# Patient Record
Sex: Male | Born: 1955 | Race: Black or African American | Hispanic: No | Marital: Married | Smoking: Never smoker
Health system: Southern US, Community
[De-identification: ages and names within clinical notes are randomized; demographics above are authoritative.]

## PROBLEM LIST (undated history)

## (undated) DIAGNOSIS — K56609 Unspecified intestinal obstruction, unspecified as to partial versus complete obstruction: Secondary | ICD-10-CM

## (undated) DIAGNOSIS — A4101 Sepsis due to Methicillin susceptible Staphylococcus aureus: Secondary | ICD-10-CM

## (undated) DIAGNOSIS — K812 Acute cholecystitis with chronic cholecystitis: Secondary | ICD-10-CM

## (undated) DIAGNOSIS — C786 Secondary malignant neoplasm of retroperitoneum and peritoneum: Secondary | ICD-10-CM

## (undated) DIAGNOSIS — K632 Fistula of intestine: Secondary | ICD-10-CM

## (undated) DIAGNOSIS — Z515 Encounter for palliative care: Secondary | ICD-10-CM

## (undated) DIAGNOSIS — R7881 Bacteremia: Secondary | ICD-10-CM

## (undated) DIAGNOSIS — C801 Malignant (primary) neoplasm, unspecified: Secondary | ICD-10-CM

## (undated) DIAGNOSIS — C762 Malignant neoplasm of abdomen: Secondary | ICD-10-CM

## (undated) DIAGNOSIS — K219 Gastro-esophageal reflux disease without esophagitis: Secondary | ICD-10-CM

## (undated) DIAGNOSIS — I10 Essential (primary) hypertension: Secondary | ICD-10-CM

## (undated) DIAGNOSIS — D638 Anemia in other chronic diseases classified elsewhere: Secondary | ICD-10-CM

## (undated) DIAGNOSIS — E43 Unspecified severe protein-calorie malnutrition: Secondary | ICD-10-CM

## (undated) DIAGNOSIS — R6521 Severe sepsis with septic shock: Secondary | ICD-10-CM

## (undated) HISTORY — PX: TONSILLECTOMY: SUR1361

---

## 1998-04-12 ENCOUNTER — Emergency Department (HOSPITAL_COMMUNITY): Admission: EM | Admit: 1998-04-12 | Discharge: 1998-04-12 | Payer: Self-pay | Admitting: Emergency Medicine

## 2008-05-27 ENCOUNTER — Encounter: Payer: Self-pay | Admitting: Emergency Medicine

## 2008-05-28 ENCOUNTER — Inpatient Hospital Stay (HOSPITAL_COMMUNITY): Admission: EM | Admit: 2008-05-28 | Discharge: 2008-05-31 | Payer: Self-pay | Admitting: General Surgery

## 2008-06-11 ENCOUNTER — Encounter: Admission: RE | Admit: 2008-06-11 | Discharge: 2008-06-11 | Payer: Self-pay | Admitting: Chiropractic Medicine

## 2008-06-15 ENCOUNTER — Encounter: Admission: RE | Admit: 2008-06-15 | Discharge: 2008-06-22 | Payer: Self-pay | Admitting: General Surgery

## 2008-07-13 ENCOUNTER — Encounter: Admission: RE | Admit: 2008-07-13 | Discharge: 2008-07-13 | Payer: Self-pay | Admitting: Neurology

## 2008-10-30 ENCOUNTER — Encounter: Payer: Self-pay | Admitting: Gastroenterology

## 2008-11-30 ENCOUNTER — Ambulatory Visit: Payer: Self-pay | Admitting: Gastroenterology

## 2008-11-30 DIAGNOSIS — D649 Anemia, unspecified: Secondary | ICD-10-CM

## 2008-11-30 DIAGNOSIS — K921 Melena: Secondary | ICD-10-CM | POA: Insufficient documentation

## 2008-11-30 DIAGNOSIS — K5900 Constipation, unspecified: Secondary | ICD-10-CM | POA: Insufficient documentation

## 2008-12-01 LAB — CONVERTED CEMR LAB
Ferritin: 373.4 ng/mL — ABNORMAL HIGH (ref 22.0–322.0)
Folate: 6.1 ng/mL
Iron: 64 ug/dL (ref 42–165)
Transferrin: 226 mg/dL (ref >=212.0)
Vitamin B-12: 223 pg/mL (ref 211–911)
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
## 2011-09-07 ENCOUNTER — Other Ambulatory Visit (HOSPITAL_COMMUNITY): Payer: Self-pay | Admitting: Oncology

## 2011-09-07 ENCOUNTER — Encounter (HOSPITAL_BASED_OUTPATIENT_CLINIC_OR_DEPARTMENT_OTHER): Payer: 59 | Admitting: Oncology

## 2011-09-07 ENCOUNTER — Encounter: Payer: Self-pay | Admitting: Oncology

## 2011-09-07 LAB — CBC WITH DIFFERENTIAL/PLATELET
BASO%: 0.3 % (ref 0.0–2.0)
EOS%: 2.1 % (ref 0.0–7.0)
MCH: 30 pg (ref 27.2–33.4)
MCV: 88.7 fL (ref 79.3–98.0)
MONO%: 12 % (ref 0.0–14.0)
RBC: 4.29 10*6/uL (ref 4.20–5.82)
RDW: 14.9 % — ABNORMAL HIGH (ref 11.0–14.6)
lymph#: 1.7 10*3/uL (ref 0.9–3.3)

## 2011-09-07 LAB — COMPREHENSIVE METABOLIC PANEL
ALT: 23 U/L (ref 0–53)
AST: 17 U/L (ref 0–37)
Alkaline Phosphatase: 94 U/L (ref 39–117)
Sodium: 141 mEq/L (ref 135–145)
Total Bilirubin: 0.5 mg/dL (ref 0.3–1.2)
Total Protein: 7.7 g/dL (ref 6.0–8.3)

## 2011-09-07 LAB — LACTATE DEHYDROGENASE: LDH: 160 U/L (ref 94–250)

## 2011-09-12 LAB — SEDIMENTATION RATE: Sed Rate: 10 mm/hr (ref 0–16)

## 2011-09-12 LAB — IRON AND TIBC
Iron: 69 ug/dL (ref 42–165)
TIBC: 285 ug/dL (ref 215–435)
UIBC: 216 ug/dL (ref 125–400)

## 2011-09-12 LAB — HEMOCHROMATOSIS DNA-PCR(C282Y,H63D)

## 2011-09-13 LAB — CBC
MCHC: 34.3
MCV: 92.2
Platelets: 168
RBC: 4.35
RDW: 13.1
WBC: 8

## 2011-09-13 LAB — URINE MICROSCOPIC-ADD ON

## 2011-09-13 LAB — DIFFERENTIAL
Basophils Relative: 0
Eosinophils Absolute: 0.2
Monocytes Relative: 10
Neutrophils Relative %: 52

## 2011-09-13 LAB — URINALYSIS, ROUTINE W REFLEX MICROSCOPIC
Bilirubin Urine: NEGATIVE
Specific Gravity, Urine: >1.046 — ABNORMAL HIGH
pH: 5

## 2011-09-13 LAB — POCT I-STAT, CHEM 8
BUN: 27 — ABNORMAL HIGH
Calcium, Ion: 1.19
Chloride: 107
Glucose, Bld: 129 — ABNORMAL HIGH
HCT: 41

## 2011-09-13 LAB — TYPE AND SCREEN: Antibody Screen: NEGATIVE

## 2011-10-02 ENCOUNTER — Other Ambulatory Visit: Payer: Self-pay | Admitting: Gastroenterology

## 2011-10-02 DIAGNOSIS — R109 Unspecified abdominal pain: Secondary | ICD-10-CM

## 2011-10-05 ENCOUNTER — Other Ambulatory Visit: Payer: 59

## 2011-10-09 ENCOUNTER — Ambulatory Visit
Admission: RE | Admit: 2011-10-09 | Discharge: 2011-10-09 | Disposition: A | Discharge status: Home / Self Care | Payer: 59 | Source: Ambulatory Visit | Attending: Gastroenterology | Admitting: Gastroenterology

## 2011-10-09 DIAGNOSIS — R109 Unspecified abdominal pain: Secondary | ICD-10-CM

## 2011-10-24 ENCOUNTER — Ambulatory Visit (HOSPITAL_COMMUNITY)
Admission: RE | Admit: 2011-10-24 | Discharge: 2011-10-24 | Disposition: A | Discharge status: Home / Self Care | Payer: 59 | Source: Ambulatory Visit | Attending: Chiropractic Medicine | Admitting: Chiropractic Medicine

## 2011-10-24 ENCOUNTER — Other Ambulatory Visit (HOSPITAL_COMMUNITY): Payer: Self-pay | Admitting: Chiropractic Medicine

## 2011-10-24 DIAGNOSIS — M545 Low back pain, unspecified: Secondary | ICD-10-CM | POA: Insufficient documentation

## 2011-10-24 DIAGNOSIS — R52 Pain, unspecified: Secondary | ICD-10-CM

## 2011-10-24 DIAGNOSIS — M5137 Other intervertebral disc degeneration, lumbosacral region: Secondary | ICD-10-CM | POA: Insufficient documentation

## 2011-10-24 DIAGNOSIS — M4120 Other idiopathic scoliosis, site unspecified: Secondary | ICD-10-CM | POA: Insufficient documentation

## 2011-10-24 DIAGNOSIS — M51379 Other intervertebral disc degeneration, lumbosacral region without mention of lumbar back pain or lower extremity pain: Secondary | ICD-10-CM | POA: Insufficient documentation

## 2011-10-24 DIAGNOSIS — M542 Cervicalgia: Secondary | ICD-10-CM | POA: Insufficient documentation

## 2011-10-24 DIAGNOSIS — M546 Pain in thoracic spine: Secondary | ICD-10-CM | POA: Insufficient documentation

## 2011-10-30 ENCOUNTER — Telehealth: Payer: Self-pay | Admitting: *Deleted

## 2011-10-30 NOTE — Telephone Encounter (Signed)
GAVE PATIENT APPOINTMENT FOR 11-16-2011.  

## 2011-11-05 ENCOUNTER — Other Ambulatory Visit: Payer: Self-pay | Admitting: Gastroenterology

## 2011-11-07 ENCOUNTER — Other Ambulatory Visit: Payer: 59

## 2011-11-07 ENCOUNTER — Encounter: Payer: Self-pay | Admitting: Emergency Medicine

## 2011-11-07 ENCOUNTER — Inpatient Hospital Stay (HOSPITAL_COMMUNITY)
Admission: EM | Admit: 2011-11-07 | Discharge: 2011-11-09 | DRG: 375 | Disposition: A | Discharge status: Home / Self Care | Payer: 59 | Attending: Internal Medicine | Admitting: Internal Medicine

## 2011-11-07 ENCOUNTER — Ambulatory Visit
Admission: RE | Admit: 2011-11-07 | Discharge: 2011-11-07 | Disposition: A | Discharge status: Home / Self Care | Payer: 59 | Source: Ambulatory Visit | Attending: Gastroenterology | Admitting: Gastroenterology

## 2011-11-07 DIAGNOSIS — K219 Gastro-esophageal reflux disease without esophagitis: Secondary | ICD-10-CM | POA: Diagnosis present

## 2011-11-07 DIAGNOSIS — D500 Iron deficiency anemia secondary to blood loss (chronic): Secondary | ICD-10-CM | POA: Diagnosis present

## 2011-11-07 DIAGNOSIS — K56600 Partial intestinal obstruction, unspecified as to cause: Secondary | ICD-10-CM

## 2011-11-07 DIAGNOSIS — D649 Anemia, unspecified: Secondary | ICD-10-CM | POA: Diagnosis present

## 2011-11-07 DIAGNOSIS — C801 Malignant (primary) neoplasm, unspecified: Secondary | ICD-10-CM | POA: Diagnosis present

## 2011-11-07 DIAGNOSIS — C786 Secondary malignant neoplasm of retroperitoneum and peritoneum: Principal | ICD-10-CM | POA: Diagnosis present

## 2011-11-07 DIAGNOSIS — K56609 Unspecified intestinal obstruction, unspecified as to partial versus complete obstruction: Secondary | ICD-10-CM

## 2011-11-07 DIAGNOSIS — E876 Hypokalemia: Secondary | ICD-10-CM

## 2011-11-07 DIAGNOSIS — K5669 Other intestinal obstruction: Secondary | ICD-10-CM | POA: Diagnosis present

## 2011-11-07 DIAGNOSIS — I10 Essential (primary) hypertension: Secondary | ICD-10-CM | POA: Diagnosis present

## 2011-11-07 DIAGNOSIS — K5900 Constipation, unspecified: Secondary | ICD-10-CM | POA: Diagnosis present

## 2011-11-07 DIAGNOSIS — C762 Malignant neoplasm of abdomen: Secondary | ICD-10-CM | POA: Diagnosis present

## 2011-11-07 DIAGNOSIS — D72829 Elevated white blood cell count, unspecified: Secondary | ICD-10-CM | POA: Diagnosis present

## 2011-11-07 HISTORY — DX: Unspecified intestinal obstruction, unspecified as to partial versus complete obstruction: K56.609

## 2011-11-07 HISTORY — DX: Gastro-esophageal reflux disease without esophagitis: K21.9

## 2011-11-07 HISTORY — DX: Essential (primary) hypertension: I10

## 2011-11-07 LAB — URINALYSIS, ROUTINE W REFLEX MICROSCOPIC
Leukocytes, UA: NEGATIVE
Nitrite: POSITIVE — AB
Specific Gravity, Urine: 1.015 (ref 1.005–1.030)
pH: 5.5 (ref 5.0–8.0)

## 2011-11-07 LAB — CBC
HCT: 38.3 % — ABNORMAL LOW (ref 39.0–52.0)
MCHC: 33.7 g/dL (ref 30.0–36.0)
Platelets: 307 10*3/uL (ref 150–400)
RDW: 14.6 % (ref 11.5–15.5)
WBC: 10.6 10*3/uL — ABNORMAL HIGH (ref 4.0–10.5)

## 2011-11-07 LAB — COMPREHENSIVE METABOLIC PANEL
ALT: 32 U/L (ref 0–53)
AST: 26 U/L (ref 0–37)
Albumin: 3.7 g/dL (ref 3.5–5.2)
Chloride: 102 mEq/L (ref 96–112)
Creatinine, Ser: 0.94 mg/dL (ref 0.50–1.35)
Sodium: 138 mEq/L (ref 135–145)
Total Bilirubin: 0.6 mg/dL (ref 0.3–1.2)

## 2011-11-07 LAB — DIFFERENTIAL
Basophils Absolute: 0 10*3/uL (ref 0.0–0.1)
Basophils Relative: 0 % (ref 0–1)
Monocytes Absolute: 1.1 10*3/uL — ABNORMAL HIGH (ref 0.1–1.0)
Neutro Abs: 7.8 10*3/uL — ABNORMAL HIGH (ref 1.7–7.7)
Neutrophils Relative %: 74 % (ref 43–77)

## 2011-11-07 LAB — URINE MICROSCOPIC-ADD ON

## 2011-11-07 LAB — LACTIC ACID, PLASMA: Lactic Acid, Venous: 0.8 mmol/L (ref 0.5–2.2)

## 2011-11-07 MED ORDER — SODIUM CHLORIDE 0.9 % IV BOLUS (SEPSIS)
1000.0000 mL | Freq: Once | INTRAVENOUS | Status: AC
  Administered 2011-11-07: 1000 mL via INTRAVENOUS

## 2011-11-07 MED ORDER — DEXTROSE-NACL 5-0.9 % IV SOLN
INTRAVENOUS | Status: DC
  Administered 2011-11-08 – 2011-11-09 (×3): via INTRAVENOUS

## 2011-11-07 MED ORDER — HYDROMORPHONE HCL PF 1 MG/ML IJ SOLN
1.0000 mg | INTRAMUSCULAR | Status: DC | PRN
  Administered 2011-11-09 (×2): 1 mg via INTRAVENOUS
  Filled 2011-11-07 (×2): qty 1

## 2011-11-07 MED ORDER — IOHEXOL 300 MG/ML  SOLN
125.0000 mL | Freq: Once | INTRAMUSCULAR | Status: AC | PRN
  Administered 2011-11-07: 125 mL via INTRAVENOUS

## 2011-11-07 MED ORDER — PANTOPRAZOLE SODIUM 40 MG IV SOLR
40.0000 mg | INTRAVENOUS | Status: DC
  Administered 2011-11-08 (×3): 40 mg via INTRAVENOUS
  Filled 2011-11-07 (×3): qty 40

## 2011-11-07 MED ORDER — ONDANSETRON HCL 4 MG PO TABS: 4.0000 mg | ORAL_TABLET | Freq: Four times a day (QID) | ORAL | Status: DC | PRN

## 2011-11-07 MED ORDER — MORPHINE SULFATE 4 MG/ML IJ SOLN
4.0000 mg | Freq: Once | INTRAMUSCULAR | Status: AC
  Administered 2011-11-07: 4 mg via INTRAVENOUS
  Filled 2011-11-07: qty 1

## 2011-11-07 MED ORDER — ONDANSETRON HCL 4 MG/2ML IJ SOLN: 4.0000 mg | Freq: Four times a day (QID) | INTRAMUSCULAR | Status: DC | PRN

## 2011-11-07 NOTE — ED Notes (Signed)
Pt c/o LUQ pain since Aug.  St's had CT this am and was called and told to come to ED ref. Some type of blockage.  Pt does not appear in acute distress at this time

## 2011-11-07 NOTE — ED Notes (Signed)
Patient states abdominal discomfort for few months haven been by Primary Doctor and had a CT of the abdomen today and patient stated told to go to the ED for SBO.  Currently pain is 7/10 achy abdomen is distended last BM 2 days ago. Airway intact bilateral equal chest rise and fall.

## 2011-11-07 NOTE — H&P (Signed)
[name] is an 55 y.o. male.   Chief Complaint: Abdominal Pain HPI: A 55 YO man who has been having abdominal pain and constipation for a while. He was seen as OP by [facility]. 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 He is being admitted for further work up. Has Abdominal pain, nausea and an episode of Vomiting in the ED.  No active bleed.   Past Medical History  Diagnosis Date  . Hypertension   . GERD (gastroesophageal reflux disease)   . Anemia     Past Surgical History  Procedure Date  . Tonsillectomy     History reviewed. No pertinent family history. Social History:  reports that he has never smoked. He does not have any smokeless tobacco history on file. He reports that he does not drink alcohol or use illicit drugs.  Allergies: No Known Allergies  Medications Prior to Admission  Medication Dose Route Frequency Provider Last Rate Last Dose  . iohexol (OMNIPAQUE) 300 MG/ML injection 125 mL  125 mL Intravenous Once PRN Medication Radiologist   125 mL at 11/07/11 1022  . morphine 4 MG/ML injection 4 mg  4 mg Intravenous Once Cyndra Numbers, MD   4 mg at 11/07/11 1806  . sodium chloride 0.9 % bolus 1,000 mL  1,000 mL Intravenous Once Cyndra Numbers, MD   1,000 mL at 11/07/11 1805  . sodium chloride 0.9 % bolus 1,000 mL  1,000 mL Intravenous Once Cyndra Numbers, MD   1,000 mL at 11/07/11 1931   No current outpatient prescriptions on file as of 11/07/2011.    Results for orders placed during the hospital encounter of 11/07/11 (from the past 48 hour(s))  CBC     Status: Abnormal   Collection Time   11/07/11  5:34 PM      Component Value Range Comment   WBC 10.6 (*) 4.0 - 10.5 (K/uL)    RBC 4.41  4.22 - 5.81 (MIL/uL)    Hemoglobin 12.9 (*) 13.0 - 17.0 (g/dL)    HCT 40.9 (*) 81.1 - 52.0 (%)    MCV 86.8  78.0 - 100.0 (fL)    MCH 29.3  26.0 - 34.0 (pg)    MCHC 33.7   30.0 - 36.0 (g/dL)    RDW 91.4  78.2 - 95.6 (%)    Platelets 307  150 - 400 (K/uL)   DIFFERENTIAL     Status: Abnormal   Collection Time   11/07/11  5:34 PM      Component Value Range Comment   Neutrophils Relative 74  43 - 77 (%)    Neutro Abs 7.8 (*) 1.7 - 7.7 (K/uL)    Lymphocytes Relative 14  12 - 46 (%)    Lymphs Abs 1.5  0.7 - 4.0 (K/uL)    Monocytes Relative 10  3 - 12 (%)    Monocytes Absolute 1.1 (*) 0.1 - 1.0 (K/uL)    Eosinophils Relative 1  0 - 5 (%)    Eosinophils Absolute 0.1  0.0 - 0.7 (K/uL)    Basophils Relative 0  0 - 1 (%)    Basophils Absolute 0.0  0.0 - 0.1 (K/uL)   COMPREHENSIVE METABOLIC PANEL     Status: Abnormal   Collection Time   11/07/11  5:34 PM      Component Value Range Comment   Sodium 138  135 - 145 (  mEq/L)    Potassium 3.8  3.5 - 5.1 (mEq/L)    Chloride 102  96 - 112 (mEq/L)    CO2 25  19 - 32 (mEq/L)    Glucose, Bld 109 (*) 70 - 99 (mg/dL)    BUN 10  6 - 23 (mg/dL)    Creatinine, Ser 2.13  0.50 - 1.35 (mg/dL)    Calcium 9.7  8.4 - 10.5 (mg/dL)    Total Protein 7.8  6.0 - 8.3 (g/dL)    Albumin 3.7  3.5 - 5.2 (g/dL)    AST 26  0 - 37 (U/L)    ALT 32  0 - 53 (U/L)    Alkaline Phosphatase 118 (*) 39 - 117 (U/L)    Total Bilirubin 0.6  0.3 - 1.2 (mg/dL)    GFR calc non Af Amer >90  >90 (mL/min)    GFR calc Af Amer >90  >90 (mL/min)   LIPASE, BLOOD     Status: Normal   Collection Time   11/07/11  5:34 PM      Component Value Range Comment   Lipase 35  11 - 59 (U/L)   URINALYSIS, ROUTINE W REFLEX MICROSCOPIC     Status: Abnormal   Collection Time   11/07/11  5:50 PM      Component Value Range Comment   Color, Urine YELLOW  YELLOW     Appearance CLEAR  CLEAR     Specific Gravity, Urine 1.015  1.005 - 1.030     pH 5.5  5.0 - 8.0     Glucose, UA NEGATIVE  NEGATIVE (mg/dL)    Hgb urine dipstick NEGATIVE  NEGATIVE     Bilirubin Urine SMALL (*) NEGATIVE     Ketones, ur 15 (*) NEGATIVE (mg/dL)    Protein, ur 30 (*) NEGATIVE (mg/dL)     Urobilinogen, UA 1.0  0.0 - 1.0 (mg/dL)    Nitrite POSITIVE (*) NEGATIVE     Leukocytes, UA NEGATIVE  NEGATIVE    URINE MICROSCOPIC-ADD ON     Status: Abnormal   Collection Time   11/07/11  5:50 PM      Component Value Range Comment   Squamous Epithelial / LPF RARE  RARE     WBC, UA 3-6  <3 (WBC/hpf)    RBC / HPF 0-2  <3 (RBC/hpf)    Bacteria, UA MANY (*) RARE    LACTIC ACID, PLASMA     Status: Normal   Collection Time   11/07/11  6:18 PM      Component Value Range Comment   Lactic Acid, Venous 0.8  0.5 - 2.2 (mmol/L)    Ct Abdomen Pelvis W Contrast  11/07/2011  *RADIOLOGY REPORT*  Clinical Data: Mid abdominal pain with alternating diarrhea and constipation for 4 months.  No acute injury or history of malignancy.  CT ABDOMEN AND PELVIS WITH CONTRAST  Technique:  Multidetector CT imaging of the abdomen and pelvis was performed following the standard protocol during bolus administration of intravenous contrast.  Contrast: OMNIPAQUE IOHEXOL 300 MG/ML IV SOLN  Comparison: Upper GI series 10/09/2011.  Abdominal pelvic CT 05/27/2008.  Findings: The lung bases are clear and there is no pleural effusion.  The patient has developed a small amount of ascites throughout the peritoneal cavity.  There is new omental nodularity asymmetric to the left concerning for possible peritoneal carcinomatosis.  No dominant peritoneal mass is demonstrated. Scout image demonstrates moderate proximal to mid small bowel distension.  The distal small bowel  is decompressed.  There are several areas of irregular small bowel wall thickening and luminal narrowing in the right mid abdomen.  A small amount of fluid is present within the colonic lumen.  The colon is relatively decompressed.  No focal colon abnormality is identified.  The liver, spleen, pancreas, gallbladder and biliary system appear unremarkable.  There is no adrenal mass.  A 2.2 cm low density lesion centrally in the right kidney was not demonstrated  previously but is most consistent with a cyst.  The left kidney appears normal.  There is no hydronephrosis.  No enlarged lymph nodes are identified.  The prostate gland is moderately enlarged but appears stable.  There is mild bladder wall thickening.  No suspicious osseous lesions are present.  IMPRESSION:  1.  Partial distal small bowel obstruction.  Ancillary findings of ascites and peritoneal nodularity suspicious for a malignant etiology (peritoneal carcinomatosis or primary small bowel neoplasm). 2.  No evidence of hepatic metastatic disease. 3.  Simple right renal cyst. 4.  Stable enlargement of the prostate gland.  These results were called by telephone on 11/07/2011  at  1045 hours to  [facility], assistant of Dr. Evette Cristal, who verbally acknowledged these results.  Original Report Authenticated By: Gerrianne Scale, M.D.    Review of Systems  Constitutional: Positive for weight loss and malaise/fatigue.  Eyes: Negative.   Respiratory: Negative.   Cardiovascular: Negative.   Gastrointestinal: Positive for nausea, vomiting, abdominal pain, constipation and blood in stool.  Genitourinary: Negative.   Musculoskeletal: Negative.   Skin: Negative.   Neurological: Positive for weakness.  Endo/Heme/Allergies: Negative.   Psychiatric/Behavioral: Negative.     Blood pressure 159/95, pulse 82, temperature 97.5 F (36.4 C), temperature source Oral, resp. rate 20, SpO2 97.00%. Physical Exam  Constitutional: He appears well-developed and well-nourished.  HENT:  Head: Normocephalic and atraumatic.  Eyes: Conjunctivae and EOM are normal. Pupils are equal, round, and reactive to light.  Neck: Normal range of motion. Neck supple.  Cardiovascular: Normal rate, regular rhythm and normal heart sounds.   Respiratory: Effort normal and breath sounds normal.  GI: He exhibits shifting dullness, distension, fluid wave and ascites. There is generalized tenderness. There is no rebound and no guarding.    Psychiatric: His speech is normal and behavior is normal. Judgment and thought content normal. Cognition and memory are normal. He exhibits a depressed mood.     Assessment/Plan 1. PSBO: No active vomiting or distension. Keep NPO, hydrate, consider NGT only if vomiting or worsening distension 2. 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 Will get Paracentesis and send fluid for cytology 3. Anemia: due to GIB and tumor. H/H seems stable. 4. Leucocytosis: Possible UTI. Will treat if confirmed  [name],LAWAL 11/07/2011, 8:38 PM

## 2011-11-07 NOTE — ED Provider Notes (Signed)
History     CSN: [id number] Arrival date & time: 11/07/2011  3:27 PM   First MD Initiated Contact with Patient 11/07/11 1722      No chief complaint on file.   (Consider location/radiation/quality/duration/timing/severity/associated sxs/prior treatment) HPI Patient is a 55 year old male who presents today after being referred by his gastroenterologist, Dr. Evette Cristal.  Patient has been having abdominal pain since August. He has had a colonoscopy as well as upper endoscopy they were negative. Today patient had a CAT scan of his abdomen and pelvis as an outpatient. His doctor told him that he should come here as there were concerns for obstruction on the film. The patient reports his last bowel movement was 2 days ago. He's never had surgery on his belly. The patient did vomit when he had contrast this morning. He denies any fevers or blood in his stools. He denies any urinary symptoms including increased urinary frequency or dysuria. There are no other associated or modifying factors. Patient reports that the pain in his upper abdomen is intermittent and also moderate to severe. It has an aching quality. There are no other associated or modifying factors. Past Medical History  Diagnosis Date  . Hypertension   . GERD (gastroesophageal reflux disease)   . Anemia     Past Surgical History  Procedure Date  . Tonsillectomy     History reviewed. No pertinent family history.  History  Substance Use Topics  . Smoking status: Never Smoker   . Smokeless tobacco: Not on file  . Alcohol Use: No     Stopped in July      Review of Systems  Constitutional: Positive for appetite change.  HENT: Negative.   Eyes: Negative.   Respiratory: Negative.   Cardiovascular: Negative.   Gastrointestinal: Positive for nausea, vomiting, abdominal pain and constipation.  Genitourinary: Negative.   Musculoskeletal: Negative.   Skin: Negative.   Neurological: Negative.   Hematological: Negative.     Psychiatric/Behavioral: Negative.   All other systems reviewed and are negative.    Allergies  Review of patient's allergies indicates no known allergies.  Home Medications   Current Outpatient Rx  Name Route Sig Dispense Refill  . ESOMEPRAZOLE MAGNESIUM 40 MG PO CPDR Oral Take 40 mg by mouth daily before breakfast.      . LISINOPRIL 10 MG PO TABS Oral Take 10 mg by mouth daily.      Marland Kitchen PROBIOTIC PEARLS PO Oral Take 1 tablet by mouth daily.        BP 158/93  Pulse 67  Temp(Src) 97.4 F (36.3 C) (Oral)  Resp 20  SpO2 97%  Physical Exam  Nursing note and vitals reviewed. Constitutional: He is oriented to person, place, and time. He appears well-developed and well-nourished. No distress.  HENT:  Head: Normocephalic and atraumatic.  Eyes: Conjunctivae and EOM are normal. Pupils are equal, round, and reactive to light.  Neck: Normal range of motion.  Cardiovascular: Normal rate, regular rhythm, normal heart sounds and intact distal pulses.  Exam reveals no gallop and no friction rub.   No murmur heard. Pulmonary/Chest: Effort normal and breath sounds normal. No respiratory distress. He has no wheezes. He has no rales.  Abdominal: Soft. Bowel sounds are normal. He exhibits no distension. There is generalized tenderness. There is no rigidity, no rebound and no guarding.  Musculoskeletal: Normal range of motion. He exhibits no edema and no tenderness.  Neurological: He is alert and oriented to person, place, and time. No cranial nerve deficit.  He exhibits normal muscle tone. Coordination normal.  Skin: Skin is warm and dry. No rash noted.  Psychiatric: He has a normal mood and affect.    ED Course  Procedures (including critical care time)   CRITICAL CARE Performed by: Cyndra Numbers   Total critical care time: 60 minutes  Critical care time was exclusive of separately billable procedures and treating other patients.  Critical care was necessary to treat or prevent  imminent or life-threatening deterioration.  Critical care was time spent personally by me on the following activities: development of treatment plan with patient and/or surrogate as well as nursing, discussions with consultants, evaluation of patient's response to treatment, examination of patient, obtaining history from patient or surrogate, ordering and performing treatments and interventions, ordering and review of laboratory studies, ordering and review of radiographic studies, pulse oximetry and re-evaluation of patient's condition. Labs Reviewed  CBC - Abnormal; Notable for the following:    WBC 10.6 (*)    Hemoglobin 12.9 (*)    HCT 38.3 (*)    All other components within normal limits  DIFFERENTIAL - Abnormal; Notable for the following:    Neutro Abs 7.8 (*)    Monocytes Absolute 1.1 (*)    All other components within normal limits  COMPREHENSIVE METABOLIC PANEL - Abnormal; Notable for the following:    Glucose, Bld 109 (*)    Alkaline Phosphatase 118 (*)    All other components within normal limits  URINALYSIS, ROUTINE W REFLEX MICROSCOPIC - Abnormal; Notable for the following:    Bilirubin Urine SMALL (*)    Ketones, ur 15 (*)    Protein, ur 30 (*)    Nitrite POSITIVE (*)    All other components within normal limits  URINE MICROSCOPIC-ADD ON - Abnormal; Notable for the following:    Bacteria, UA MANY (*)    All other components within normal limits  LIPASE, BLOOD  LACTIC ACID, PLASMA   Ct Abdomen Pelvis W Contrast  11/07/2011  *RADIOLOGY REPORT*  Clinical Data: Mid abdominal pain with alternating diarrhea and constipation for 4 months.  No acute injury or history of malignancy.  CT ABDOMEN AND PELVIS WITH CONTRAST  Technique:  Multidetector CT imaging of the abdomen and pelvis was performed following the standard protocol during bolus administration of intravenous contrast.  Contrast: OMNIPAQUE IOHEXOL 300 MG/ML IV SOLN  Comparison: Upper GI series 10/09/2011.   Abdominal pelvic CT 05/27/2008.  Findings: The lung bases are clear and there is no pleural effusion.  The patient has developed a small amount of ascites throughout the peritoneal cavity.  There is new omental nodularity asymmetric to the left concerning for possible peritoneal carcinomatosis.  No dominant peritoneal mass is demonstrated. Scout image demonstrates moderate proximal to mid small bowel distension.  The distal small bowel is decompressed.  There are several areas of irregular small bowel wall thickening and luminal narrowing in the right mid abdomen.  A small amount of fluid is present within the colonic lumen.  The colon is relatively decompressed.  No focal colon abnormality is identified.  The liver, spleen, pancreas, gallbladder and biliary system appear unremarkable.  There is no adrenal mass.  A 2.2 cm low density lesion centrally in the right kidney was not demonstrated previously but is most consistent with a cyst.  The left kidney appears normal.  There is no hydronephrosis.  No enlarged lymph nodes are identified.  The prostate gland is moderately enlarged but appears stable.  There is mild bladder wall  thickening.  No suspicious osseous lesions are present.  IMPRESSION:  1.  Partial distal small bowel obstruction.  Ancillary findings of ascites and peritoneal nodularity suspicious for a malignant etiology (peritoneal carcinomatosis or primary small bowel neoplasm). 2.  No evidence of hepatic metastatic disease. 3.  Simple right renal cyst. 4.  Stable enlargement of the prostate gland.  These results were called by telephone on 11/07/2011  at  1045 hours to  [facility], assistant of Dr. Evette Cristal, who verbally acknowledged these results.  Original Report Authenticated By: Gerrianne Scale, M.D.     1. Partial small bowel obstruction       MDM  The patient was evaluated by myself. Patient had a CT today there was performed as an outpatient. His gastroenterologist told him only that he might  have a small bowel distraction. On my review of the result the patient actually had concerning findings for malignancy. He has no known malignancy at this time. Patient has lesions in both the small intestine as well as the peritoneal wall. I contacted the gastroenterologist.  I was able to reach Dr. Ewing Schlein who was on call for the primary gastroenterologist. He had not been told that the patient being sent to the ED. He was kind enough to speak with the patient's primary gastroenterologist regarding the findings. After speaking with him he did agree that the patient's prognosis was very grave. I then spoke with Dr. Daphine Deutscher who is on for general. surgery at [facility] but was kind enough to discuss the case.  Given the patient's current exam there was no need for immediate surgical involvement.  Dr. Daphine Deutscher did agree that the patient would likely need paracentesis with cytology studies as well as further inpatient evaluation. I spoke at length with the patient and his family regarding his CAT scan results. Based on our conversation they were not aware of the possibility of malignancy. I spent 20-30 minutes in conversation with the family attempting to answer their questions. The patient was accepted for admission to the hospitalist service. Gastroenterology was consult it once again to discuss the patient's care with the admitting team. The patient will be admitted for further management.  Cyndra Numbers, MD 11/07/11 2228

## 2011-11-08 ENCOUNTER — Encounter (HOSPITAL_COMMUNITY): Payer: Self-pay | Admitting: General Practice

## 2011-11-08 ENCOUNTER — Inpatient Hospital Stay (HOSPITAL_COMMUNITY): Payer: 59

## 2011-11-08 DIAGNOSIS — E876 Hypokalemia: Secondary | ICD-10-CM

## 2011-11-08 LAB — COMPREHENSIVE METABOLIC PANEL
Albumin: 2.9 g/dL — ABNORMAL LOW (ref 3.5–5.2)
BUN: 8 mg/dL (ref 6–23)
Creatinine, Ser: 0.86 mg/dL (ref 0.50–1.35)
Total Protein: 5.8 g/dL — ABNORMAL LOW (ref 6.0–8.3)

## 2011-11-08 LAB — MAGNESIUM: Magnesium: 1.5 mg/dL (ref 1.5–2.5)

## 2011-11-08 LAB — CBC
HCT: 30.4 % — ABNORMAL LOW (ref 39.0–52.0)
MCV: 87.6 fL (ref 78.0–100.0)
Platelets: 219 10*3/uL (ref 150–400)
RBC: 3.47 MIL/uL — ABNORMAL LOW (ref 4.22–5.81)
WBC: 4.5 10*3/uL (ref 4.0–10.5)

## 2011-11-08 LAB — PSA: PSA: 7.13 ng/mL — ABNORMAL HIGH (ref <=4.00)

## 2011-11-08 MED ORDER — POTASSIUM CHLORIDE CRYS ER 20 MEQ PO TBCR
40.0000 meq | EXTENDED_RELEASE_TABLET | Freq: Once | ORAL | Status: AC
  Administered 2011-11-08: 40 meq via ORAL

## 2011-11-08 NOTE — Procedures (Signed)
RLQ paracentesis yields 200 cc clear yellow fluid.  No comp.

## 2011-11-08 NOTE — Progress Notes (Signed)
Subjective: Feels 100% better. Is ready to eat.  Objective: Vital signs in last 24 hours: Temp:  [97.3 F (36.3 C)-98.3 F (36.8 C)] 98.3 F (36.8 C) (11/22 0615) Pulse Rate:  [62-82] 62  (11/22 0615) Resp:  [20] 20  (11/22 0615) BP: (145-167)/(87-96) 167/94 mmHg (11/22 0900) SpO2:  [97 %-100 %] 99 % (11/22 0615) Weight:  [117.2 kg (258 lb 6.1 oz)] 258 lb 6.1 oz (117.2 kg) (11/21 2200) Weight change:  Last BM Date: 11/06/11  Intake/Output from previous day: 11/21 0701 - 11/22 0700 In: 800 [IV Piggyback:800] Out: 850 [Urine:850] Total I/O In: -  Out: 225 [Urine:225]   Physical Exam: General: Alert, awake, oriented x3, in no acute distress. HEENT: No bruits, no goiter. Heart: Regular rate and rhythm, without murmurs, rubs, gallops. Lungs: Clear to auscultation bilaterally. Abdomen: Soft, nontender, hypoactive bowel sounds Extremities: No clubbing cyanosis or edema with positive pedal pulses. Neuro: Grossly intact, nonfocal.    Lab Results: Basic Metabolic Panel:  Basename 11/08/11 0720 11/07/11 1734  NA 141 138  K 3.3* 3.8  CL 108 102  CO2 25 25  GLUCOSE 124* 109*  BUN 8 10  CREATININE 0.86 0.94  CALCIUM 8.4 9.7  MG -- --  PHOS -- --   Liver Function Tests:  [facility] 11/08/11 0720 11/07/11 1734  AST 19 26  ALT 25 32  ALKPHOS 88 118*  BILITOT 0.6 0.6  PROT 5.8* 7.8  ALBUMIN 2.9* 3.7    Basename 11/07/11 1734  LIPASE 35  AMYLASE --   CBC:  Basename 11/08/11 0720 11/07/11 1734  WBC 4.5 10.6*  NEUTROABS -- 7.8*  HGB 10.2* 12.9*  HCT 30.4* 38.3*  MCV 87.6 86.8  PLT 219 307    Studies/Results: Abd 1 View (kub)  11/08/2011  *RADIOLOGY REPORT*  Clinical Data: partial small bowel obstruction  ABDOMEN - 1 VIEW  Comparison: Yesterday  Findings: Distended small bowel loops persist.  No obvious free intraperitoneal gas.  Colonic gas is present.  IMPRESSION: Persistent partial small bowel obstruction pattern.  Original Report Authenticated By: Donavan Burnet, M.D.   Ct Abdomen Pelvis W Contrast  11/07/2011  *RADIOLOGY REPORT*  Clinical Data: Mid abdominal pain with alternating diarrhea and constipation for 4 months.  No acute injury or history of malignancy.  CT ABDOMEN AND PELVIS WITH CONTRAST  Technique:  Multidetector CT imaging of the abdomen and pelvis was performed following the standard protocol during bolus administration of intravenous contrast.  Contrast: OMNIPAQUE IOHEXOL 300 MG/ML IV SOLN  Comparison: Upper GI series 10/09/2011.  Abdominal pelvic CT 05/27/2008.  Findings: The lung bases are clear and there is no pleural effusion.  The patient has developed a small amount of ascites throughout the peritoneal cavity.  There is new omental nodularity asymmetric to the left concerning for possible peritoneal carcinomatosis.  No dominant peritoneal mass is demonstrated. Scout image demonstrates moderate proximal to mid small bowel distension.  The distal small bowel is decompressed.  There are several areas of irregular small bowel wall thickening and luminal narrowing in the right mid abdomen.  A small amount of fluid is present within the colonic lumen.  The colon is relatively decompressed.  No focal colon abnormality is identified.  The liver, spleen, pancreas, gallbladder and biliary system appear unremarkable.  There is no adrenal mass.  A 2.2 cm low density lesion centrally in the right kidney was not demonstrated previously but is most consistent with a cyst.  The left kidney appears normal.  There is no hydronephrosis.  No enlarged lymph nodes are identified.  The prostate gland is moderately enlarged but appears stable.  There is mild bladder wall thickening.  No suspicious osseous lesions are present.  IMPRESSION:  1.  Partial distal small bowel obstruction.  Ancillary findings of ascites and peritoneal nodularity suspicious for a malignant etiology (peritoneal carcinomatosis or primary small bowel neoplasm). 2.  No evidence of  hepatic metastatic disease. 3.  Simple right renal cyst. 4.  Stable enlargement of the prostate gland.  These results were called by telephone on 11/07/2011  at  1045 hours to  [facility], assistant of Dr. Evette Cristal, who verbally acknowledged these results.  Original Report Authenticated By: Gerrianne Scale, M.D.   US Paracentesis  11/08/2011  *RADIOLOGY REPORT*  Clinical Data: Ascites  ULTRASOUND GUIDED PARACENTESIS  Comparison:  None  An ultrasound guided paracentesis was thoroughly discussed with the patient and questions answered.  The benefits, risks, alternatives and complications were also discussed.  The patient understands and wishes to proceed with the procedure.  Written consent was obtained.  Ultrasound was performed to localize and mark an adequate pocket of fluid in the right lower quadrant quadrant of the abdomen.  The area was then prepped and draped in the normal sterile fashion.  1% Lidocaine was used for local anesthesia.  Under ultrasound guidance a 19 gauge Yueh catheter was introduced.  Paracentesis was performed.  The catheter was removed and a dressing applied.  Complications:  None  Findings:  A total of approximately 200 ml of clear yellow fluid was removed.  A fluid sample was sent for laboratory analysis.  IMPRESSION: Successful ultrasound guided paracentesis yielding 200 ml of ascites.  Original Report Authenticated By: Donavan Burnet, M.D.    Medications: Scheduled Meds:   .  morphine injection  4 mg Intravenous Once  . pantoprazole (PROTONIX) IV  40 mg Intravenous Q24H  . sodium chloride  1,000 mL Intravenous Once  . sodium chloride  1,000 mL Intravenous Once   Continuous Infusions:   . dextrose 5 % and 0.9% NaCl 125 mL/hr at 11/08/11 0158   PRN Meds:.HYDROmorphone, ondansetron (ZOFRAN) IV, ondansetron  Assessment/Plan:  Principal Problem:  *SBO (small bowel obstruction) Active Problems:  ANEMIA  Abdominal malignant neoplasm  Leukocytosis  #1 partial small bowel  obstruction: Clinically improved. Did not require NG suction. Feels like he is ready to eat. I will start him on clear liquids and advance diet as tolerated.  #2 peritoneal carcinomatosis: This is probably what caused his small bowel obstruction. Upon questioning patient states he has been having abdominal pain for about 3 months. He has seen Dr. Evette Cristal with GI who has done an EGD and colonoscopy that per patient report are negative. I have ordered a PSA, CEA, CA 19 9, he has also had a paracentesis and fluid has been sent for cytology. I will call GI to see him in the morning.  #3 hypokalemia: Replete orally and check a magnesium level.   LOS: 1 day   [name],[name] 11/08/2011, 11:04 AM

## 2011-11-09 ENCOUNTER — Inpatient Hospital Stay (HOSPITAL_COMMUNITY): Payer: 59

## 2011-11-09 LAB — CBC
Hemoglobin: 10.5 g/dL — ABNORMAL LOW (ref 13.0–17.0)
MCH: 29 pg (ref 26.0–34.0)
MCHC: 33 g/dL (ref 30.0–36.0)
RDW: 14.5 % (ref 11.5–15.5)

## 2011-11-09 LAB — COMPREHENSIVE METABOLIC PANEL
ALT: 23 U/L (ref 0–53)
AST: 17 U/L (ref 0–37)
Alkaline Phosphatase: 89 U/L (ref 39–117)
CO2: 27 mEq/L (ref 19–32)
Calcium: 8.6 mg/dL (ref 8.4–10.5)
Chloride: 109 mEq/L (ref 96–112)
GFR calc Af Amer: >90 mL/min (ref >90)
GFR calc non Af Amer: >90 mL/min (ref >90)
Glucose, Bld: 127 mg/dL — ABNORMAL HIGH (ref 70–99)
Potassium: 3.7 mEq/L (ref 3.5–5.1)
Sodium: 142 mEq/L (ref 135–145)
Total Bilirubin: 0.4 mg/dL (ref 0.3–1.2)

## 2011-11-09 MED ORDER — HYDROCODONE-ACETAMINOPHEN 5-500 MG PO TABS: 1.0000 | ORAL_TABLET | Freq: Four times a day (QID) | ORAL | Status: AC | PRN

## 2011-11-09 MED ORDER — IOHEXOL 300 MG/ML  SOLN
80.0000 mL | Freq: Once | INTRAMUSCULAR | Status: AC | PRN
  Administered 2011-11-09: 80 mL via INTRAVENOUS

## 2011-11-09 NOTE — Discharge Summary (Signed)
Physician Discharge Summary  Patient ID: [name] MRN: [id number] DOB/AGE: 01-02-1956 55 y.o.  Admit date: 11/07/2011 Discharge date: 11/09/2011  Primary Care Physician:  No primary provider on file.   Discharge Diagnoses:    Principal Problem:  *SBO (small bowel obstruction) Active Problems:  ANEMIA  ??Peritoneal Carcinomatosis  Leukocytosis  Hypokalemia    Current Discharge Medication List    START taking these medications   Details  HYDROcodone-acetaminophen (VICODIN) 5-500 MG per tablet Take 1 tablet by mouth every 6 (six) hours as needed for pain. Qty: 30 tablet, Refills: 0      CONTINUE these medications which have NOT CHANGED   Details  esomeprazole (NEXIUM) 40 MG capsule Take 40 mg by mouth daily before breakfast.      lisinopril (PRINIVIL,ZESTRIL) 10 MG tablet Take 10 mg by mouth daily.        STOP taking these medications     Probiotic Product (PROBIOTIC PEARLS PO)          Disposition and Follow-up:  Patient will be discharged home today in stable and improved condition. Has an outpatient appointment for a PET scan on December 6, he has also been given the number for health connect to appoint himself a new primary care physician, has also been asked to schedule followup with his gastroenterologist Dr. Evette Cristal in one week.  Consults:  none    Significant Diagnostic Studies:  Abd 1 View (kub)  11/08/2011  *RADIOLOGY REPORT*  Clinical Data: partial small bowel obstruction  ABDOMEN - 1 VIEW  Comparison: Yesterday  Findings: Distended small bowel loops persist.  No obvious free intraperitoneal gas.  Colonic gas is present.  IMPRESSION: Persistent partial small bowel obstruction pattern.  Original Report Authenticated By: Donavan Burnet, M.D.   US Paracentesis  11/08/2011  *RADIOLOGY REPORT*  Clinical Data: Ascites  ULTRASOUND GUIDED PARACENTESIS  Comparison:  None  An ultrasound guided paracentesis was thoroughly discussed with the patient and  questions answered.  The benefits, risks, alternatives and complications were also discussed.  The patient understands and wishes to proceed with the procedure.  Written consent was obtained.  Ultrasound was performed to localize and mark an adequate pocket of fluid in the right lower quadrant quadrant of the abdomen.  The area was then prepped and draped in the normal sterile fashion.  1% Lidocaine was used for local anesthesia.  Under ultrasound guidance a 19 gauge Yueh catheter was introduced.  Paracentesis was performed.  The catheter was removed and a dressing applied.  Complications:  None  Findings:  A total of approximately 200 ml of clear yellow fluid was removed.  A fluid sample was sent for laboratory analysis.  IMPRESSION: Successful ultrasound guided paracentesis yielding 200 ml of ascites.  Original Report Authenticated By: Donavan Burnet, M.D.    Brief H and P: For complete details please refer to admission H and P, but in brief patient is a pleasant 55 year old man who has been having a history of abdominal pain for the past 3 months. Has been seen by Dr. Evette Cristal who has performed upper and lower endoscopies that apparently have been negative as per patient report. He returned to the hospital on day of admission with complaints of abdominal pain and constipation he was found to have a partial small bowel obstruction on CT scan done in the emergency department as well as evidence of possible peritoneal carcinomatosis and because of this we were asked to admit him for further evaluation and management.  Hospital Course:  Principal Problem:  *SBO (small bowel obstruction) Active Problems:  ANEMIA  Abdominal malignant neoplasm  Leukocytosis  Hypokalemia  #1 partial small bowel obstruction: This has clinically resolved. He is tolerating a solid diet without nausea or vomiting. He has had 3 bowel movements in the past 24 hours.  #2 possible peritoneal carcinomatosis: This was noted on CT  scan on admission. A paracentesis was performed with fluid sent for cytology. I have called the pathology department today and they have confirmed receipt of the specimen however it will not be until Tuesday at the earliest when they have a result. I have also discussed this case with the interventional radiologist on call who reviewed patient's CT scan. He tells me that although it is possible that this is peritoneal carcinomatosis it is also possible that this could be fluid from his ascites tracking into the tissues. He has recommended that we hold off on any further biopsy procedures at this time and await results of cytology, he has also recommended that we send patient for a PET scan, if this is just fluid then it should not have any hypermetabolic activity, however if it does end up being cancer spread then other areas may light up which may be more feasible to biopsy then the peritoneal lesions. All of this information has been conveyed to the patient and patient's son. Given the fact that he currently has no primary care physician, I have asked that he followup with his gastroenterologist Dr. Evette Cristal so that he can coordinate this investigation until the patient sees a new primary care provider to avoid him being lost to followup.  #3 mild hypokalemia: Presumably secondary to emesis, repleted.  Time spent on Discharge: Greater than 30 minutes.  SignedChaya Jan 11/09/2011, 3:36 PM

## 2011-11-09 NOTE — Progress Notes (Signed)
Subjective: Is tolerating solid meals, has had 3 bowel movements overnight. No nausea or vomiting.  Objective: Vital signs in last 24 hours: Temp:  [96.4 F (35.8 C)-97.5 F (36.4 C)] 97.5 F (36.4 C) (11/23 0552) Pulse Rate:  [57-66] 62  (11/23 0552) Resp:  [18-20] 20  (11/23 0552) BP: (138-153)/(81-93) 153/93 mmHg (11/23 0552) SpO2:  [97 %-100 %] 97 % (11/23 0552) Weight change:  Last BM Date: 11/08/11  Intake/Output from previous day: 11/22 0701 - 11/23 0700 In: -  Out: 1175 [Urine:1175]     Physical Exam: General: Alert, awake, oriented x3, in no acute distress. HEENT: No bruits, no goiter. Heart: Regular rate and rhythm, without murmurs, rubs, gallops. Lungs: Clear to auscultation bilaterally. Abdomen: Soft, nontender, nondistended, positive bowel sounds. Extremities: No clubbing cyanosis or edema with positive pedal pulses. Neuro: Grossly intact, nonfocal.    Lab Results: Basic Metabolic Panel:  Basename 11/09/11 0600 11/08/11 1120 11/08/11 0720  NA 142 -- 141  K 3.7 -- 3.3*  CL 109 -- 108  CO2 27 -- 25  GLUCOSE 127* -- 124*  BUN 5* -- 8  CREATININE 0.90 -- 0.86  CALCIUM 8.6 -- 8.4  MG -- 1.5 --  PHOS -- -- --   Liver Function Tests:  Basename 11/09/11 0600 11/08/11 0720  AST 17 19  ALT 23 25  ALKPHOS 89 88  BILITOT 0.4 0.6  PROT 6.1 5.8*  ALBUMIN 3.0* 2.9*    Basename 11/07/11 1734  LIPASE 35  AMYLASE --   CBC:  Basename 11/09/11 0600 11/08/11 0720 11/07/11 1734  WBC 5.5 4.5 --  NEUTROABS -- -- 7.8*  HGB 10.5* 10.2* --  HCT 31.8* 30.4* --  MCV 87.8 87.6 --  PLT 220 219 --    Studies/Results: Abd 1 View (kub)  11/08/2011  *RADIOLOGY REPORT*  Clinical Data: partial small bowel obstruction  ABDOMEN - 1 VIEW  Comparison: Yesterday  Findings: Distended small bowel loops persist.  No obvious free intraperitoneal gas.  Colonic gas is present.  IMPRESSION: Persistent partial small bowel obstruction pattern.  Original Report Authenticated  By: Donavan Burnet, M.D.   US Paracentesis  11/08/2011  *RADIOLOGY REPORT*  Clinical Data: Ascites  ULTRASOUND GUIDED PARACENTESIS  Comparison:  None  An ultrasound guided paracentesis was thoroughly discussed with the patient and questions answered.  The benefits, risks, alternatives and complications were also discussed.  The patient understands and wishes to proceed with the procedure.  Written consent was obtained.  Ultrasound was performed to localize and mark an adequate pocket of fluid in the right lower quadrant quadrant of the abdomen.  The area was then prepped and draped in the normal sterile fashion.  1% Lidocaine was used for local anesthesia.  Under ultrasound guidance a 19 gauge Yueh catheter was introduced.  Paracentesis was performed.  The catheter was removed and a dressing applied.  Complications:  None  Findings:  A total of approximately 200 ml of clear yellow fluid was removed.  A fluid sample was sent for laboratory analysis.  IMPRESSION: Successful ultrasound guided paracentesis yielding 200 ml of ascites.  Original Report Authenticated By: Donavan Burnet, M.D.    Medications: Scheduled Meds:   . pantoprazole (PROTONIX) IV  40 mg Intravenous Q24H  . potassium chloride  40 mEq Oral Once   Continuous Infusions:   . dextrose 5 % and 0.9% NaCl 125 mL/hr at 11/09/11 0442   PRN Meds:.HYDROmorphone, ondansetron (ZOFRAN) IV, ondansetron  Assessment/Plan:  Principal Problem:  *SBO (small  bowel obstruction) Active Problems:  ANEMIA  Abdominal malignant neoplasm  Leukocytosis  Hypokalemia  #1 partial small bowel obstruction: This has clinically resolved. He is tolerating solid meals without abdominal pain or nausea, has also had 3 bowel movements.  #2 peritoneal carcinomatosis: This is probably what has been causing his abdominal pain for the past 3 months as well as the small bowel obstruction. Patient had a paracentesis with fluid sent for cytology. I have called the  pathology department and we will not have results back until Tuesday at the earliest. I have also placed in a call to interventional radiology to see if these lesions are feasible for biopsy and if so whether would this be possible. If interventional radiology is not able to biopsy these lesions, then probably general surgery will need to be involved for some sort of laparoscopic procedure for tissue retrieval. Depending on the timing of all these procedures, we may decide to discharge the patient home today with outpatient followup next week versus possibly keeping him if the biopsies are planned to be done within a short time frame.  #3 hypokalemia, repleted.   LOS: 2 days   HERNANDEZ ACOSTA,ESTELA 11/09/2011, 10:41 AM

## 2011-11-09 NOTE — Progress Notes (Signed)
11/09/2011 [name] Case Management Note [phone]         55 yo male patient admitted on 11/07/11 with c/o abdominal pain and constipation. History significant for HTN, Gerd, and anemia. Diagnosed with  small bowel obstruction and then with peritoneal carcinomatosis. In to speak with patient. Prior to admission,patient lived at home with spouse. Independent with ADLs. No PCP, so [facility] number with explanation given to patient and written on D/C instructions. No other home or discharge needs identified.       Per Dr. Hardie Shackleton request, radiology at [facility] contacted to schedule an outpatient PET appointment. Spoke withTiffany in scheduling. Appointment made for Thursday December 12, 2011. Patient to be there at 0730 for registration and PET scan to done at 0900. Patient to be NPO after midnight before procedure. All information placed on D/C instructions.

## 2011-11-09 NOTE — Progress Notes (Signed)
[name] 12:59 PM  Subjective: The patient is seen at the request of the hospital team and well known to my partner Dr. Evette Cristal who I discussed the case with him discussed the case with the patient his wife and 2 different hospital Dr. and the ER physician. The patient is currently stable with minimal abdominal pain and no nausea or vomiting except for from the CT dye and has been moving his bowels and passing air. His workup was reviewed on the computer which included a nondiagnostic colonoscopy the end of September as well as a nondiagnostic ultrasound and upper GI however his CT scan on Wednesday showed a partial small bowel obstruction probable peritoneal implants and some small ascites which was sampled on Thursday unfortunately the results are pending. I have reviewed the CT and if needed a CT directed biopsy by interventional radiology should be done next and then either oncology consult or surgical consult. he has no other complaints Objective: Vital signs stable afebrile no acute distress Abdomen is soft nontender no guarding or rebound adequate bowel sounds Labs and CT reviewed including nondiagnostic tumor marker  Assessment: Peritoneal carcinomatosis questionable primary  Plan: Await cytology result CT directed needle biopsy next and then oncology versus surgical consult. Okay with me to do all of the above quickly as an outpatient since the patient is eating okay and we discussed his diet and either I or Dr. Evette Cristal can assist from the office next week. Please call me or my partner Dr. Dulce Sellar this weekend if any further question or problem [facility]

## 2011-11-16 ENCOUNTER — Ambulatory Visit: Payer: 59 | Admitting: Oncology

## 2011-11-16 ENCOUNTER — Other Ambulatory Visit: Payer: 59 | Admitting: Lab

## 2011-11-21 ENCOUNTER — Other Ambulatory Visit (HOSPITAL_COMMUNITY): Payer: Self-pay | Admitting: Internal Medicine

## 2011-11-21 DIAGNOSIS — C801 Malignant (primary) neoplasm, unspecified: Secondary | ICD-10-CM

## 2011-11-22 ENCOUNTER — Encounter (HOSPITAL_COMMUNITY): Payer: Self-pay

## 2011-11-22 ENCOUNTER — Encounter (HOSPITAL_COMMUNITY)
Admit: 2011-11-22 | Discharge: 2011-11-22 | Disposition: A | Discharge status: Home / Self Care | Payer: 59 | Attending: Internal Medicine | Admitting: Internal Medicine

## 2011-11-22 DIAGNOSIS — C801 Malignant (primary) neoplasm, unspecified: Secondary | ICD-10-CM | POA: Insufficient documentation

## 2011-11-22 DIAGNOSIS — R188 Other ascites: Secondary | ICD-10-CM | POA: Insufficient documentation

## 2011-11-22 DIAGNOSIS — N400 Enlarged prostate without lower urinary tract symptoms: Secondary | ICD-10-CM | POA: Insufficient documentation

## 2011-11-22 DIAGNOSIS — C786 Secondary malignant neoplasm of retroperitoneum and peritoneum: Secondary | ICD-10-CM | POA: Insufficient documentation

## 2011-11-22 LAB — GLUCOSE, CAPILLARY: Glucose-Capillary: 99 mg/dL (ref 70–99)

## 2011-11-22 MED ORDER — FLUDEOXYGLUCOSE F - 18 (FDG) INJECTION
17.1000 | Freq: Once | INTRAVENOUS | Status: AC | PRN
  Administered 2011-11-22: 17.1 via INTRAVENOUS

## 2011-11-23 ENCOUNTER — Other Ambulatory Visit (HOSPITAL_COMMUNITY): Payer: Self-pay | Admitting: Gastroenterology

## 2011-11-23 DIAGNOSIS — K668 Other specified disorders of peritoneum: Secondary | ICD-10-CM

## 2011-11-27 ENCOUNTER — Other Ambulatory Visit: Payer: Self-pay | Admitting: Radiology

## 2011-11-28 ENCOUNTER — Telehealth: Payer: Self-pay | Admitting: Oncology

## 2011-11-28 NOTE — Telephone Encounter (Signed)
S/w pt today re appt for 12/20 @ 1:30 pm w/FS

## 2011-11-29 ENCOUNTER — Telehealth: Payer: Self-pay | Admitting: Oncology

## 2011-11-29 ENCOUNTER — Encounter (HOSPITAL_COMMUNITY): Payer: Self-pay | Admitting: Pharmacy Technician

## 2011-11-29 NOTE — Telephone Encounter (Signed)
Referred by Dr. Evette Cristal Dx- Abd Neoplasm

## 2011-11-30 ENCOUNTER — Ambulatory Visit (HOSPITAL_COMMUNITY)
Admission: RE | Admit: 2011-11-30 | Discharge: 2011-11-30 | Disposition: A | Discharge status: Home / Self Care | Payer: 59 | Source: Ambulatory Visit | Attending: Gastroenterology | Admitting: Gastroenterology

## 2011-11-30 ENCOUNTER — Other Ambulatory Visit (HOSPITAL_COMMUNITY): Payer: Self-pay | Admitting: Radiology

## 2011-11-30 ENCOUNTER — Encounter (HOSPITAL_COMMUNITY): Payer: Self-pay

## 2011-11-30 ENCOUNTER — Other Ambulatory Visit: Payer: Self-pay | Admitting: Interventional Radiology

## 2011-11-30 DIAGNOSIS — C786 Secondary malignant neoplasm of retroperitoneum and peritoneum: Secondary | ICD-10-CM | POA: Insufficient documentation

## 2011-11-30 DIAGNOSIS — Z01812 Encounter for preprocedural laboratory examination: Secondary | ICD-10-CM | POA: Insufficient documentation

## 2011-11-30 DIAGNOSIS — C801 Malignant (primary) neoplasm, unspecified: Secondary | ICD-10-CM | POA: Insufficient documentation

## 2011-11-30 DIAGNOSIS — K668 Other specified disorders of peritoneum: Secondary | ICD-10-CM

## 2011-11-30 LAB — CBC
HCT: 35.4 % — ABNORMAL LOW (ref 39.0–52.0)
MCH: 29.1 pg (ref 26.0–34.0)
MCV: 87.2 fL (ref 78.0–100.0)
Platelets: 220 10*3/uL (ref 150–400)
RDW: 14.4 % (ref 11.5–15.5)

## 2011-11-30 MED ORDER — FENTANYL CITRATE 0.05 MG/ML IJ SOLN
INTRAMUSCULAR | Status: AC
  Filled 2011-11-30: qty 4

## 2011-11-30 MED ORDER — MIDAZOLAM HCL 2 MG/2ML IJ SOLN
INTRAMUSCULAR | Status: AC
  Filled 2011-11-30: qty 4

## 2011-11-30 MED ORDER — MIDAZOLAM HCL 5 MG/5ML IJ SOLN
INTRAMUSCULAR | Status: DC | PRN
  Administered 2011-11-30 (×4): 1 mg via INTRAVENOUS

## 2011-11-30 MED ORDER — FENTANYL CITRATE 0.05 MG/ML IJ SOLN
INTRAMUSCULAR | Status: DC | PRN
  Administered 2011-11-30 (×2): 25 ug via INTRAVENOUS
  Administered 2011-11-30: 50 ug via INTRAVENOUS

## 2011-11-30 MED ORDER — SODIUM CHLORIDE 0.9 % IV SOLN: INTRAVENOUS | Status: DC

## 2011-11-30 NOTE — Procedures (Signed)
Successful CT guided biopsy of left lateral omental peritoneal nodule. No immediate complications. Awaiting pathology report.

## 2011-11-30 NOTE — H&P (Signed)
[name] is an 55 y.o. male.   Chief Complaint: Abdominal mass  HPI: 55 yo male with history of abdominal pain. Found to have omental mass. Sent by Dr. Evette Cristal for CT guided biopsy. Pain resolved.  Past Medical History  Diagnosis Date  . Hypertension   . GERD (gastroesophageal reflux disease)   . Anemia     Past Surgical History  Procedure Date  . Tonsillectomy     No family history on file. Social History:  reports that he has never smoked. He has never used smokeless tobacco. He reports that he does not drink alcohol or use illicit drugs.  Allergies: No Known Allergies  Medications Prior to Admission  Medication Sig Dispense Refill  . lisinopril (PRINIVIL,ZESTRIL) 10 MG tablet Take 10 mg by mouth daily.         Medications Prior to Admission  Medication Dose Route Frequency Provider Last Rate Last Dose  . 0.9 %  sodium chloride infusion   Intravenous Continuous Alan Leu, PA        Results for orders placed during the hospital encounter of 11/30/11 (from the past 48 hour(s))  CBC     Status: Abnormal   Collection Time   11/30/11  7:39 AM      Component Value Range Comment   WBC 6.8  4.0 - 10.5 (K/uL)    RBC 4.06 (*) 4.22 - 5.81 (MIL/uL)    Hemoglobin 11.8 (*) 13.0 - 17.0 (g/dL)    HCT 78.2 (*) 95.6 - 52.0 (%)    MCV 87.2  78.0 - 100.0 (fL)    MCH 29.1  26.0 - 34.0 (pg)    MCHC 33.3  30.0 - 36.0 (g/dL)    RDW 21.3  08.6 - 57.8 (%)    Platelets 220  150 - 400 (K/uL)    No results found.  Review of Systems  Constitutional: Positive for weight loss and malaise/fatigue. Negative for fever and chills.  Eyes: Negative for blurred vision.  Respiratory: Positive for cough. Negative for sputum production, shortness of breath and wheezing.   Cardiovascular: Negative for chest pain and palpitations.  Gastrointestinal: Negative for abdominal pain, diarrhea, constipation and blood in stool.  Genitourinary: Negative for dysuria.  Neurological: Negative for  headaches.  Endo/Heme/Allergies: Does not bruise/bleed easily.    Blood pressure 142/84, pulse 64, temperature 96.7 F (35.9 C), temperature source Oral, resp. rate 18, height 6\' 4"  (1.93 m), weight 248 lb (112.492 kg), SpO2 100.00%. Physical Exam  Heent unremarkable Heart - Regular with ectopics Lungs - clear Abd - NT - soft  Assessment/Plan CT guided left lateral omental tumor biopsy Dr Grace Isaac. Informed consent obtained.  [name] 11/30/2011, 8:33 AM

## 2011-11-30 NOTE — ED Notes (Signed)
Pt. Had vent bigeminy at start of procedure, MD Watts aware, procedure continued.

## 2011-12-03 ENCOUNTER — Other Ambulatory Visit: Payer: Self-pay | Admitting: Oncology

## 2011-12-03 DIAGNOSIS — C801 Malignant (primary) neoplasm, unspecified: Secondary | ICD-10-CM

## 2011-12-06 ENCOUNTER — Ambulatory Visit (HOSPITAL_BASED_OUTPATIENT_CLINIC_OR_DEPARTMENT_OTHER): Payer: 59 | Admitting: Oncology

## 2011-12-06 ENCOUNTER — Ambulatory Visit: Payer: 59

## 2011-12-06 ENCOUNTER — Other Ambulatory Visit (HOSPITAL_BASED_OUTPATIENT_CLINIC_OR_DEPARTMENT_OTHER): Payer: 59

## 2011-12-06 VITALS — BP 158/96 | HR 79 | Temp 97.0°F | Ht 76.0 in | Wt 246.3 lb

## 2011-12-06 DIAGNOSIS — I10 Essential (primary) hypertension: Secondary | ICD-10-CM

## 2011-12-06 DIAGNOSIS — C801 Malignant (primary) neoplasm, unspecified: Secondary | ICD-10-CM

## 2011-12-06 DIAGNOSIS — C762 Malignant neoplasm of abdomen: Secondary | ICD-10-CM

## 2011-12-06 LAB — CBC WITH DIFFERENTIAL/PLATELET
BASO%: 0.3 % (ref 0.0–2.0)
Basophils Absolute: 0 10*3/uL (ref 0.0–0.1)
HCT: 37.4 % — ABNORMAL LOW (ref 38.4–49.9)
HGB: 12.5 g/dL — ABNORMAL LOW (ref 13.0–17.1)
LYMPH%: 23.1 % (ref 14.0–49.0)
MCH: 28.9 pg (ref 27.2–33.4)
MCHC: 33.4 g/dL (ref 32.0–36.0)
MONO#: 0.8 10*3/uL (ref 0.1–0.9)
NEUT%: 61.8 % (ref 39.0–75.0)
Platelets: 228 10*3/uL (ref 140–400)

## 2011-12-06 LAB — COMPREHENSIVE METABOLIC PANEL
AST: 26 U/L (ref 0–37)
Albumin: 4.2 g/dL (ref 3.5–5.2)
BUN: 10 mg/dL (ref 6–23)
Calcium: 9.6 mg/dL (ref 8.4–10.5)
Chloride: 106 mEq/L (ref 96–112)
Creatinine, Ser: 0.91 mg/dL (ref 0.50–1.35)
Glucose, Bld: 105 mg/dL — ABNORMAL HIGH (ref 70–99)
Potassium: 3.8 mEq/L (ref 3.5–5.3)

## 2011-12-06 LAB — LACTATE DEHYDROGENASE: LDH: 137 U/L (ref 94–250)

## 2011-12-06 NOTE — Progress Notes (Signed)
CC:   [name]. [name], M.D. [name], M.D.  REASON FOR CONSULTATION:  Peritoneal cancer.  HISTORY OF PRESENT ILLNESS:  This is a pleasant 55 year old African- American gentleman native of [place] who had been living in [place] since the '70s.  He is a gentleman who has had his own business and had done so the majority of his adult life.  Currently he works in a courier business.  He is a gentleman in relatively good health and shape.  He does have a history of hypertension, and he follows with Dr. Cleta [name] intermittently regarding that.  His symptoms date back to July of 2012 when he developed symptoms of abdominal bloating and distention associated with really aching pain.  At first he thought it was related to exercise, but he started developing more of a stabbing severe pain and was referred to gastroenterology, Dr. Evette Cristal, who performed an extensive workup that included a colonoscopy that was unremarkable  except for benign polyps.  He subsequently underwent CT scan of the abdomen and pelvis that was done on 11/07/2011 that showed a partial distal small bowel obstruction with ancillary finding of ascites as well as peritoneal nodularity suspicious for a peritoneal involvement with carcinomatosis.  There was no evidence of any hepatic disease at that time.  No other abnormality.  CT scan of the chest was normal.  He underwent an ultrasound-guided paracentesis on 11/22 which yielded about 200 mL of ascites.  The cytology from that was case number [id number] showing atypical cells worrisome for malignancy.  Subsequently, he underwent a PET-CT scan on 11/22/2011, which showed findings of abnormal peritoneal nodularity and thickening consistent with carcinomatosis that is relatively not FDG avid.  There is no definitive primary lesion identified with the abdomen or pelvis.  There are foci of hypermetabolism within the large and small bowels, likely physiological. There was  prostatomegaly and hypermetabolism within the posterior aspect of the prostate at that time.  The patient underwent a CT-guided biopsy on 11/30/2011, and case number [id number] showed clusters of malignant cells with consistent with metastatic adenocarcinoma with signet cell features.  A further immunohistochemical staining showed a CDX 2 positive, CK 20 positive and a CK 7 negative.  The overall findings were suggestive of gastrointestinal tract primary possibly including gastric. A differential diagnosis included intestinal primary at that time.  For that reason, the patient was referred to me for evaluation from a medical oncology standpoint.  Clinically, [name] reports he had been doing relatively fair.  He does report some abdominal discomfort at times and a gurgling sound, but for the most part, really the abdomen is not causing him much problem.  He does report some occasional dyspepsia and has had early satiety.  He has had about a 50-pound weight loss in the last 5 months.  REVIEW OF SYSTEMS:  Otherwise, he does not report any headaches, blurry vision, double vision, any motor or sensory neuropathy.  He does not report any alteration of mental status.  No psychiatric issues or depression.  He does not report any fever, chills, sweats.  He does not report any cough, hemoptysis, hematemesis.  No nausea or vomiting.  No abdominal pain.  No hematochezia, melena or genitourinary complaints. No frequency, urgency, or hesitancy.  No musculoskeletal complaints.  No arthralgias, myalgias.  No heat or cold tolerance, bleeding, clotting, diathesis.  The rest of the review of systems is unremarkable.  PAST MEDICAL HISTORY:  Significant for hypertension, but no history of diabetes or coronary  artery disease.  PAST SURGICAL HISTORY:  Tonsillectomy as a child, but no abdominal surgery.  MEDICATION:  He is on lisinopril and Nexium.  FAMILY HISTORY:  His father had hypertension,  possibly died of cardiac complication.  So did his mother.  At this time, no history of any malignant disorders.  SOCIAL HISTORY:  He had a history of smoking cigars in the past but not currently an active smoker.  He denied any alcohol abuse.  He has 1 son.  ALLERGIES:  None.  PHYSICAL EXAMINATION:  General:  An alert, awake, pleasant gentleman, who appeared in no active distress.  His ECOG performance status is zero.  Vital Signs:  Blood pressure is 156/96, pulse 79, respirations 20, temperature 97.  HEENT:  [name] is normocephalic, atraumatic.  Pupils are equal, round, and reactive to light.  Oral mucosa moist and pink. Neck:  Supple.  No adenopathy.  Heart:  Regular rate and rhythm.  S1, S2.  Lungs:  Clear to auscultation.  No rhonchi or wheeze.  No dullness to percussion.  Abdomen:  Soft, nontender.  No hepatosplenomegaly. Extremities:  No clubbing, cyanosis, or edema.  Neurologic:  Intact motor, sensory and deep tendon reflexes.  LABORATORY DATA:  Hemoglobin of 12.5, white cell count of 5.8, platelet count of 228.  ASSESSMENT AND PLAN:  This is a pleasant 55 year old gentleman with the following issues: 1. 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  His PET-CT     scan, as mentioned, on 11/22/2011 showed abdominal peritoneal     nodularity and thickening that is not FDG avid with a biopsy that     confirmed the presence of adenocarcinoma as mentioned.  The     immunohistochemical staining suggests a gastrointestinal primary.     I had a lengthy discussion with [name] about his recent     findings and the management strategies.  I have talked to him about     the primary tumor.  At this point, it could be gastrointestinal in     nature, such as stomach, small intestines or the appendix, and     certainly the immunohistochemical staining suggests, again, gastric     in nature, but it seems  that he had extensive GI workup, and     despite that workup, the primary has not been clearly elucidated at     this time.  At this point, I do not recommend any further workup or     evaluation or biopsy as really the primary will be less relevant at     this time as far as the management is concerned.  I have talked to     him about the different approaches at this time.  The use of     systemic chemotherapy was discussed today in detail with Mr.     [name].  However, I feel that given his age and excellent     performance status, I think a surgical resection should be     considered at this point with the possibility of intraperitoneal     chemotherapy as well.  He already has a referral to see Dr. [name]     at [facility] on January 4th for an evaluation.  If     [name] is not a candidate for the surgery or it is felt it will     not be helpful, then we will  consider chemotherapy at that time.     All his questions were answered today and I will set him up for a     followup after he has been evaluated by Dr. [name]. 2. History of elevated ferritin.  It is probably reactive in nature.     I doubt there is a hematological disorder at this time.    ______________________________ Benjiman Core, M.D. FNS/MEDQ  D:  12/06/2011  T:  12/06/2011  Job:  [id number]

## 2011-12-06 NOTE — Progress Notes (Signed)
Note dictated

## 2011-12-18 DIAGNOSIS — K56609 Unspecified intestinal obstruction, unspecified as to partial versus complete obstruction: Secondary | ICD-10-CM

## 2011-12-18 HISTORY — PX: SMALL INTESTINE SURGERY: SHX150

## 2011-12-18 HISTORY — DX: Unspecified intestinal obstruction, unspecified as to partial versus complete obstruction: K56.609

## 2012-03-26 ENCOUNTER — Other Ambulatory Visit: Payer: Self-pay

## 2012-03-26 ENCOUNTER — Emergency Department (HOSPITAL_COMMUNITY): Payer: 59

## 2012-03-26 ENCOUNTER — Emergency Department (HOSPITAL_COMMUNITY)
Admission: EM | Admit: 2012-03-26 | Discharge: 2012-03-27 | Disposition: A | Discharge status: Other Acute Inpatient Hospital | Payer: 59 | Attending: Emergency Medicine | Admitting: Emergency Medicine

## 2012-03-26 DIAGNOSIS — E876 Hypokalemia: Secondary | ICD-10-CM

## 2012-03-26 DIAGNOSIS — R000 Tachycardia, unspecified: Secondary | ICD-10-CM | POA: Insufficient documentation

## 2012-03-26 DIAGNOSIS — R197 Diarrhea, unspecified: Secondary | ICD-10-CM | POA: Insufficient documentation

## 2012-03-26 DIAGNOSIS — E860 Dehydration: Secondary | ICD-10-CM

## 2012-03-26 DIAGNOSIS — I10 Essential (primary) hypertension: Secondary | ICD-10-CM | POA: Insufficient documentation

## 2012-03-26 DIAGNOSIS — R112 Nausea with vomiting, unspecified: Secondary | ICD-10-CM | POA: Insufficient documentation

## 2012-03-26 DIAGNOSIS — Z933 Colostomy status: Secondary | ICD-10-CM | POA: Insufficient documentation

## 2012-03-26 DIAGNOSIS — R0602 Shortness of breath: Secondary | ICD-10-CM | POA: Insufficient documentation

## 2012-03-26 LAB — CBC
MCH: 30.6 pg (ref 26.0–34.0)
MCHC: 35.3 g/dL (ref 30.0–36.0)
MCV: 86.6 fL (ref 78.0–100.0)
Platelets: 203 10*3/uL (ref 150–400)
RDW: 15.7 % — ABNORMAL HIGH (ref 11.5–15.5)
WBC: 10 10*3/uL (ref 4.0–10.5)

## 2012-03-26 LAB — BASIC METABOLIC PANEL
Calcium: 9.6 mg/dL (ref 8.4–10.5)
Creatinine, Ser: 1.49 mg/dL — ABNORMAL HIGH (ref 0.50–1.35)
GFR calc Af Amer: 59 mL/min — ABNORMAL LOW (ref >90)
GFR calc non Af Amer: 51 mL/min — ABNORMAL LOW (ref >90)
Sodium: 133 mEq/L — ABNORMAL LOW (ref 135–145)

## 2012-03-26 LAB — DIFFERENTIAL
Basophils Absolute: 0 10*3/uL (ref 0.0–0.1)
Basophils Relative: 0 % (ref 0–1)
Eosinophils Absolute: 0.1 10*3/uL (ref 0.0–0.7)
Eosinophils Relative: 1 % (ref 0–5)
Lymphocytes Relative: 25 % (ref 12–46)
Monocytes Absolute: 2.2 10*3/uL — ABNORMAL HIGH (ref 0.1–1.0)

## 2012-03-26 LAB — GLUCOSE, CAPILLARY: Glucose-Capillary: 127 mg/dL — ABNORMAL HIGH (ref 70–99)

## 2012-03-26 MED ORDER — POTASSIUM CHLORIDE 10 MEQ/100ML IV SOLN
10.0000 meq | Freq: Once | INTRAVENOUS | Status: AC
  Administered 2012-03-26: 10 meq via INTRAVENOUS
  Filled 2012-03-26: qty 100

## 2012-03-26 MED ORDER — ONDANSETRON HCL 4 MG/2ML IJ SOLN
4.0000 mg | Freq: Once | INTRAMUSCULAR | Status: AC
  Administered 2012-03-26: 4 mg via INTRAVENOUS
  Filled 2012-03-26: qty 2

## 2012-03-26 MED ORDER — SODIUM CHLORIDE 0.9 % IV BOLUS (SEPSIS)
1000.0000 mL | Freq: Once | INTRAVENOUS | Status: AC
  Administered 2012-03-26: 1000 mL via INTRAVENOUS

## 2012-03-26 NOTE — ED Provider Notes (Signed)
Medical screening examination/treatment/procedure(s) were conducted as a shared visit with non-physician practitioner(s) and myself.  I personally evaluated the patient during the encounter 63 y male with his of peritoneal ca.   Had colostomy and recent chemorx.  Today became weak and almost passed out.  Has incr. Output from colostomy.  + copious emesis.  asx now except thirsty and weak.  pe no distress. xr no obstruction.  Will arrange transfer to [facility]. Where his oncologist and surgeon are located. Pt and family agree with plan.   Cheri Guppy, MD 03/26/12 [phone]

## 2012-03-26 NOTE — ED Notes (Signed)
EMS brings pt from home, pt feeling weak after chemo treatment last week, pt seen at [facility] yesterday for same symptoms, treated there with IVF, and po potassium. Pt feel chemo medication is causing him to excrete too much fluid. Pt has colostomy, and has needed to empty it every 20 min. Pt also co of nausea no vomiting but dry heaves.

## 2012-03-26 NOTE — ED Notes (Addendum)
MD at bedside. Dr. Caporossi at bedside.  

## 2012-03-26 NOTE — ED Notes (Signed)
Pt reports weakness, shortness of breath, dizziness, diarrhea, and dry heaves

## 2012-03-26 NOTE — ED Provider Notes (Signed)
History     CSN: [id number]  Arrival date & time 03/26/12  1903   First MD Initiated Contact with Patient 03/26/12 2006      Chief Complaint  Patient presents with  . Weakness  . Dizziness    (Consider location/radiation/quality/duration/timing/severity/associated sxs/prior treatment) HPI  Patient presents to emergency department complaining of a one-week history of gradual onset of generalized fatigue, weakness, decreased appetite, copious loose stool within his colostomy bag, and mild nausea. Patient states that he has omental cancer that is followed at [facility] in [place] by Dr. Herbie Saxon heme/ocn general surgeon, Dr. Gwyndolyn Saxon and Dr.Usrey who are his "chemotherapy docs." Patient states that he had been followed by [facility] in the past as a primary care physician office but has not seen them in quite some time.  Patient states he had mild hypertension prior to cancer diagnosis but no other known medical problems. Patient states that one week ago he had chemotherapy (3rd) and since then has had increasing fatigue, weakness, decreased appetite, and mild nausea. Patient states that his colostomy bag is putting out copious amounts of liquid over the last 1-2 days. Patient went to [facility] yesterday because of aforementioned symptoms and concern that he was dehydrated where he states lab work showed he had low potassium. He states they gave him IV fluids, potassium medication, and discuss followup to readdress his chemotherapy. Patient was discharged home but states that since being discharged he is having ongoing large amounts of liquid stool in his colostomy bag. He states his measured out pot is greater than a liter a day for the last 2 days. While evaluating the patient he had acute onset vomiting. Patient vomited copious amount of liquid emesis. He states this is the first vomiting he has had. He denies any associated fevers but has felt hot over the last 2 days.  He denies chills, chest pain, abdominal pain, dysuria, hematuria, or blood in his stool. Patient states that he felt well with the 2 prior chemotherapy treatments. He states he had been eating a normal diet but that appetite has decreased. Patient is scheduled for chemotherapy every other week, for a total of 6 treatments. His next treatment which will be the fourth, is scheduled next Wednesday one week from today. Patient denies aggravating or alleviating factors.  Past Medical History  Diagnosis Date  . Hypertension   . GERD (gastroesophageal reflux disease)   . Anemia     Past Surgical History  Procedure Date  . Tonsillectomy     No family history on file.  History  Substance Use Topics  . Smoking status: Never Smoker   . Smokeless tobacco: Never Used  . Alcohol Use: No     Stopped in July      Review of Systems  All other systems reviewed and are negative.    Allergies  Review of patient's allergies indicates no known allergies.  Home Medications   Current Outpatient Rx  Name Route Sig Dispense Refill  . ESOMEPRAZOLE MAGNESIUM 20 MG PO CPDR Oral Take 20 mg by mouth daily before breakfast.    . LOPERAMIDE HCL 2 MG PO CAPS Oral Take 2 mg by mouth 4 (four) times daily as needed.    Marland Kitchen ONDANSETRON HCL 4 MG PO TABS Oral Take 4 mg by mouth every 8 (eight) hours as needed. For nausea    . POTASSIUM CHLORIDE CRYS ER 20 MEQ PO TBCR Oral Take 40 mEq by mouth 2 (two) times daily.    Marland Kitchen  LISINOPRIL 10 MG PO TABS Oral Take 10 mg by mouth daily.        BP 121/88  Temp(Src) 97.3 F (36.3 C) (Oral)  SpO2 99%  Physical Exam  Nursing note and vitals reviewed. Constitutional: He is oriented to person, place, and time. He appears well-developed and well-nourished. No distress.  HENT:  Head: Normocephalic and atraumatic.  Eyes: Conjunctivae are normal.  Neck: Normal range of motion. Neck supple.  Cardiovascular: Regular rhythm, normal heart sounds and intact distal pulses.   Tachycardia present.  Exam reveals no gallop and no friction rub.   No murmur heard. Pulmonary/Chest: Effort normal and breath sounds normal. No respiratory distress. He has no wheezes. He has no rales. He exhibits no tenderness.  Abdominal: Soft. Bowel sounds are normal. He exhibits no distension and no mass. There is no tenderness. There is no rebound and no guarding.       Colostomy bag with large amount of liquid yellowish stool  Musculoskeletal: Normal range of motion. He exhibits no edema and no tenderness.  Neurological: He is alert and oriented to person, place, and time.  Skin: Skin is warm and dry. No rash noted. He is not diaphoretic. No erythema.  Psychiatric: He has a normal mood and affect.    ED Course  Procedures (including critical care time)  IV fluids, IV Zofran  Labs Reviewed  CBC - Abnormal; Notable for the following:    RDW 15.7 (*)    All other components within normal limits  DIFFERENTIAL - Abnormal; Notable for the following:    Monocytes Relative 22 (*)    Monocytes Absolute 2.2 (*)    All other components within normal limits  GLUCOSE, CAPILLARY - Abnormal; Notable for the following:    Glucose-Capillary 127 (*)    All other components within normal limits  BASIC METABOLIC PANEL   Dg Abd Acute W/chest  03/26/2012  *RADIOLOGY REPORT*  Clinical Data: Shortness of breath, nausea and vomiting.  ACUTE ABDOMEN SERIES (ABDOMEN 2 VIEW & CHEST 1 VIEW)  Comparison: CT of the chest performed 11/09/2011, and abdominal radiograph performed 11/08/2011  Findings: The lungs are well-aerated and clear.  There is no evidence of focal opacification, pleural effusion or pneumothorax. The cardiomediastinal silhouette is within normal limits.  A left- sided chest port is noted ending about the mid to distal SVC.  The visualized bowel gas pattern is unremarkable.  Scattered stool and air are seen within the colon; there is no evidence of small bowel dilatation to suggest  obstruction.  No free intra-abdominal air is identified on the provided upright view.  A tubular structure overlying the left side of the abdomen and the pelvis appears to be outside the patient, reflecting the patient's PEG tube.  The right lower quadrant ileostomy is noted.  No acute osseous abnormalities are seen; the sacroiliac joints are unremarkable in appearance.  IMPRESSION:  1.  Unremarkable bowel gas pattern; no free intra-abdominal air seen. 2.  No acute cardiopulmonary process identified.  Original Report Authenticated By: Tonia Ghent, M.D.   11:11 PM Assessment and plan discussed with Dr. Carlena Sax with Dr. Kayleen Memos at [facility] who states he'll accept patient in transfer but has to check on bed availability. He will call back on bed availability for transfer. Patient's vital signs are stable. We'll continue to monitor while he is in ER while waiting for bed for transfer.  1. Nausea vomiting and diarrhea   2. Hypokalemia   3. Dehydration  Transfer explained to patient who is agreeable to plan. Patient evaluated by Dr. Weldon Inches.  Dr. Kayleen Memos has excepted patient at [facility]. VSS.  MDM          Jenness Corner, PA 03/27/12 [phone]

## 2012-03-26 NOTE — ED Notes (Signed)
ZOX:[id number]<AV> Expected date:03/26/12<BR> Expected time:<BR> Means of arrival:<BR> Comments:<BR> EMS 12 GC - cancer patient/ nausea

## 2012-03-27 NOTE — ED Notes (Signed)
Report to Bryce, RN of [facility].  

## 2012-03-27 NOTE — ED Notes (Signed)
[facility] here to transport pt 

## 2012-03-28 NOTE — ED Provider Notes (Signed)
Medical screening examination/treatment/procedure(s) were conducted as a shared visit with non-physician practitioner(s) and myself.  I personally evaluated the patient during the encounter  Emmary Culbreath, MD 03/28/12 0717 

## 2012-07-18 ENCOUNTER — Encounter: Payer: Self-pay | Admitting: Emergency Medicine

## 2012-08-17 HISTORY — PX: DEBULKING: SHX6277

## 2012-09-16 HISTORY — PX: COLON SURGERY: SHX602

## 2012-10-17 DIAGNOSIS — C762 Malignant neoplasm of abdomen: Secondary | ICD-10-CM

## 2012-10-17 HISTORY — DX: Malignant neoplasm of abdomen: C76.2

## 2012-10-23 ENCOUNTER — Inpatient Hospital Stay
Admission: AD | Admit: 2012-10-23 | Discharge: 2012-12-19 | Disposition: A | Discharge status: Other Acute Inpatient Hospital | Payer: 59 | Source: Ambulatory Visit | Attending: Internal Medicine | Admitting: Internal Medicine

## 2012-10-23 DIAGNOSIS — R1110 Vomiting, unspecified: Secondary | ICD-10-CM

## 2012-10-23 DIAGNOSIS — K921 Melena: Secondary | ICD-10-CM

## 2012-10-23 DIAGNOSIS — C762 Malignant neoplasm of abdomen: Secondary | ICD-10-CM

## 2012-10-23 DIAGNOSIS — K56609 Unspecified intestinal obstruction, unspecified as to partial versus complete obstruction: Secondary | ICD-10-CM

## 2012-10-23 DIAGNOSIS — D649 Anemia, unspecified: Secondary | ICD-10-CM

## 2012-10-23 DIAGNOSIS — C799 Secondary malignant neoplasm of unspecified site: Secondary | ICD-10-CM

## 2012-10-23 HISTORY — DX: Unspecified intestinal obstruction, unspecified as to partial versus complete obstruction: K56.609

## 2012-10-23 HISTORY — DX: Malignant neoplasm of abdomen: C76.2

## 2012-10-24 ENCOUNTER — Other Ambulatory Visit (HOSPITAL_COMMUNITY): Payer: Self-pay

## 2012-10-24 LAB — CBC WITH DIFFERENTIAL/PLATELET
Basophils Absolute: 0 10*3/uL (ref 0.0–0.1)
Basophils Relative: 0 % (ref 0–1)
Eosinophils Absolute: 0.2 10*3/uL (ref 0.0–0.7)
Hemoglobin: 7.2 g/dL — ABNORMAL LOW (ref 13.0–17.0)
MCH: 29.5 pg (ref 26.0–34.0)
MCHC: 32.7 g/dL (ref 30.0–36.0)
Neutro Abs: 8.6 10*3/uL — ABNORMAL HIGH (ref 1.7–7.7)
Neutrophils Relative %: 82 % — ABNORMAL HIGH (ref 43–77)
Platelets: 311 10*3/uL (ref 150–400)

## 2012-10-24 LAB — TSH: TSH: 1.994 u[IU]/mL (ref 0.350–4.500)

## 2012-10-24 LAB — MAGNESIUM: Magnesium: 2 mg/dL (ref 1.5–2.5)

## 2012-10-24 LAB — COMPREHENSIVE METABOLIC PANEL
ALT: 21 U/L (ref 0–53)
AST: 26 U/L (ref 0–37)
Albumin: 1.4 g/dL — ABNORMAL LOW (ref 3.5–5.2)
Alkaline Phosphatase: 186 U/L — ABNORMAL HIGH (ref 39–117)
Chloride: 101 mEq/L (ref 96–112)
Potassium: 3.8 mEq/L (ref 3.5–5.1)
Sodium: 136 mEq/L (ref 135–145)
Total Bilirubin: 1.5 mg/dL — ABNORMAL HIGH (ref 0.3–1.2)
Total Protein: 6.3 g/dL (ref 6.0–8.3)

## 2012-10-24 LAB — VITAMIN B12: Vitamin B-12: 447 pg/mL (ref 211–911)

## 2012-10-24 LAB — ABO/RH: ABO/RH(D): B POS

## 2012-10-24 MED ORDER — IOHEXOL 300 MG/ML  SOLN
100.0000 mL | Freq: Once | INTRAMUSCULAR | Status: AC | PRN
  Administered 2012-10-24: 100 mL via INTRAVENOUS

## 2012-10-25 LAB — TYPE AND SCREEN
ABO/RH(D): B POS
Antibody Screen: NEGATIVE
Unit division: 0

## 2012-10-26 ENCOUNTER — Other Ambulatory Visit (HOSPITAL_COMMUNITY): Payer: Self-pay

## 2012-10-26 LAB — RENAL FUNCTION PANEL
CO2: 29 mEq/L (ref 19–32)
Calcium: 9.1 mg/dL (ref 8.4–10.5)
Creatinine, Ser: 0.56 mg/dL (ref 0.50–1.35)
GFR calc Af Amer: >90 mL/min (ref >90)
Glucose, Bld: 151 mg/dL — ABNORMAL HIGH (ref 70–99)

## 2012-10-27 LAB — DIFFERENTIAL
Eosinophils Relative: 1 % (ref 0–5)
Lymphocytes Relative: 15 % (ref 12–46)
Lymphs Abs: 1.6 10*3/uL (ref 0.7–4.0)
Monocytes Absolute: 1.1 10*3/uL — ABNORMAL HIGH (ref 0.1–1.0)
Monocytes Relative: 11 % (ref 3–12)

## 2012-10-27 LAB — COMPREHENSIVE METABOLIC PANEL
AST: 22 U/L (ref 0–37)
Alkaline Phosphatase: 183 U/L — ABNORMAL HIGH (ref 39–117)
BUN: 29 mg/dL — ABNORMAL HIGH (ref 6–23)
CO2: 32 mEq/L (ref 19–32)
Chloride: 100 mEq/L (ref 96–112)
Creatinine, Ser: 0.64 mg/dL (ref 0.50–1.35)
GFR calc non Af Amer: >90 mL/min (ref >90)
Total Bilirubin: 1.2 mg/dL (ref 0.3–1.2)

## 2012-10-27 LAB — FOLATE RBC: RBC Folate: 601 ng/mL — ABNORMAL HIGH (ref >=366)

## 2012-10-27 LAB — CBC
HCT: 26.4 % — ABNORMAL LOW (ref 39.0–52.0)
Hemoglobin: 8.6 g/dL — ABNORMAL LOW (ref 13.0–17.0)
MCV: 87.7 fL (ref 78.0–100.0)
RDW: 15 % (ref 11.5–15.5)
WBC: 10.4 10*3/uL (ref 4.0–10.5)

## 2012-10-27 LAB — MAGNESIUM: Magnesium: 2.1 mg/dL (ref 1.5–2.5)

## 2012-10-29 LAB — BASIC METABOLIC PANEL
BUN: 28 mg/dL — ABNORMAL HIGH (ref 6–23)
CO2: 29 mEq/L (ref 19–32)
Chloride: 102 mEq/L (ref 96–112)
Creatinine, Ser: 0.64 mg/dL (ref 0.50–1.35)
Glucose, Bld: 121 mg/dL — ABNORMAL HIGH (ref 70–99)
Potassium: 4 mEq/L (ref 3.5–5.1)

## 2012-10-30 LAB — BASIC METABOLIC PANEL
CO2: 26 mEq/L (ref 19–32)
Calcium: 9 mg/dL (ref 8.4–10.5)
Chloride: 101 mEq/L (ref 96–112)
Creatinine, Ser: 0.62 mg/dL (ref 0.50–1.35)
Glucose, Bld: 165 mg/dL — ABNORMAL HIGH (ref 70–99)

## 2012-11-01 LAB — BASIC METABOLIC PANEL
BUN: 25 mg/dL — ABNORMAL HIGH (ref 6–23)
CO2: 26 mEq/L (ref 19–32)
Calcium: 8.9 mg/dL (ref 8.4–10.5)
Chloride: 101 mEq/L (ref 96–112)
Creatinine, Ser: 0.59 mg/dL (ref 0.50–1.35)
GFR calc Af Amer: >90 mL/min (ref >90)

## 2012-11-01 LAB — CBC
HCT: 28.8 % — ABNORMAL LOW (ref 39.0–52.0)
MCH: 28.8 pg (ref 26.0–34.0)
MCV: 90 fL (ref 78.0–100.0)
Platelets: 297 10*3/uL (ref 150–400)
RDW: 15.5 % (ref 11.5–15.5)

## 2012-11-01 LAB — POTASSIUM: Potassium: 4.3 mEq/L (ref 3.5–5.1)

## 2012-11-03 ENCOUNTER — Other Ambulatory Visit (HOSPITAL_COMMUNITY): Payer: Self-pay

## 2012-11-03 LAB — BASIC METABOLIC PANEL
CO2: 26 mEq/L (ref 19–32)
Calcium: 9.4 mg/dL (ref 8.4–10.5)
Creatinine, Ser: 0.51 mg/dL (ref 0.50–1.35)
GFR calc non Af Amer: >90 mL/min (ref >90)
Glucose, Bld: 192 mg/dL — ABNORMAL HIGH (ref 70–99)
Sodium: 135 mEq/L (ref 135–145)

## 2012-11-03 LAB — CBC
Hemoglobin: 8.7 g/dL — ABNORMAL LOW (ref 13.0–17.0)
MCH: 28.9 pg (ref 26.0–34.0)
MCHC: 32.2 g/dL (ref 30.0–36.0)
MCV: 89.7 fL (ref 78.0–100.0)
RBC: 3.01 MIL/uL — ABNORMAL LOW (ref 4.22–5.81)

## 2012-11-03 MED ORDER — IOHEXOL 300 MG/ML  SOLN
100.0000 mL | Freq: Once | INTRAMUSCULAR | Status: AC | PRN
  Administered 2012-11-03: 100 mL via INTRAVENOUS

## 2012-11-04 LAB — COMPREHENSIVE METABOLIC PANEL
ALT: 31 U/L (ref 0–53)
BUN: 28 mg/dL — ABNORMAL HIGH (ref 6–23)
CO2: 27 mEq/L (ref 19–32)
Calcium: 9.3 mg/dL (ref 8.4–10.5)
Creatinine, Ser: 0.58 mg/dL (ref 0.50–1.35)
GFR calc Af Amer: >90 mL/min (ref >90)
GFR calc non Af Amer: >90 mL/min (ref >90)
Glucose, Bld: 134 mg/dL — ABNORMAL HIGH (ref 70–99)
Sodium: 134 mEq/L — ABNORMAL LOW (ref 135–145)
Total Protein: 7.5 g/dL (ref 6.0–8.3)

## 2012-11-05 LAB — CBC WITH DIFFERENTIAL/PLATELET
Basophils Absolute: 0 10*3/uL (ref 0.0–0.1)
Eosinophils Relative: 2 % (ref 0–5)
HCT: 26.9 % — ABNORMAL LOW (ref 39.0–52.0)
Lymphocytes Relative: 21 % (ref 12–46)
Lymphs Abs: 1.6 10*3/uL (ref 0.7–4.0)
MCV: 88.5 fL (ref 78.0–100.0)
Monocytes Absolute: 1.5 10*3/uL — ABNORMAL HIGH (ref 0.1–1.0)
Neutro Abs: 4.7 10*3/uL (ref 1.7–7.7)
Platelets: 282 10*3/uL (ref 150–400)
RBC: 3.04 MIL/uL — ABNORMAL LOW (ref 4.22–5.81)
WBC: 8 10*3/uL (ref 4.0–10.5)

## 2012-11-05 LAB — TSH: TSH: 1.436 u[IU]/mL (ref 0.350–4.500)

## 2012-11-05 LAB — VITAMIN B12: Vitamin B-12: 579 pg/mL (ref 211–911)

## 2012-11-06 LAB — BASIC METABOLIC PANEL
Calcium: 10.3 mg/dL (ref 8.4–10.5)
GFR calc non Af Amer: >90 mL/min (ref >90)
Glucose, Bld: 122 mg/dL — ABNORMAL HIGH (ref 70–99)
Potassium: 4.4 mEq/L (ref 3.5–5.1)
Sodium: 138 mEq/L (ref 135–145)

## 2012-11-06 LAB — HEPATIC FUNCTION PANEL
AST: 25 U/L (ref 0–37)
Albumin: 3.1 g/dL — ABNORMAL LOW (ref 3.5–5.2)
Total Bilirubin: 1.3 mg/dL — ABNORMAL HIGH (ref 0.3–1.2)
Total Protein: 8.7 g/dL — ABNORMAL HIGH (ref 6.0–8.3)

## 2012-11-07 ENCOUNTER — Other Ambulatory Visit (HOSPITAL_COMMUNITY): Payer: Self-pay

## 2012-11-08 LAB — URIC ACID: Uric Acid, Serum: 5.3 mg/dL (ref 4.0–7.8)

## 2012-11-09 LAB — BASIC METABOLIC PANEL
BUN: 36 mg/dL — ABNORMAL HIGH (ref 6–23)
CO2: 28 mEq/L (ref 19–32)
Chloride: 96 mEq/L (ref 96–112)
Creatinine, Ser: 0.66 mg/dL (ref 0.50–1.35)
GFR calc Af Amer: >90 mL/min (ref >90)
Glucose, Bld: 129 mg/dL — ABNORMAL HIGH (ref 70–99)
Potassium: 3.9 mEq/L (ref 3.5–5.1)

## 2012-11-09 LAB — CBC
HCT: 26.7 % — ABNORMAL LOW (ref 39.0–52.0)
Hemoglobin: 8.5 g/dL — ABNORMAL LOW (ref 13.0–17.0)
MCV: 89.6 fL (ref 78.0–100.0)
RBC: 2.98 MIL/uL — ABNORMAL LOW (ref 4.22–5.81)
WBC: 8.1 10*3/uL (ref 4.0–10.5)

## 2012-11-10 LAB — CBC
HCT: 25.1 % — ABNORMAL LOW (ref 39.0–52.0)
Hemoglobin: 8.3 g/dL — ABNORMAL LOW (ref 13.0–17.0)
MCH: 29.4 pg (ref 26.0–34.0)
MCHC: 33.1 g/dL (ref 30.0–36.0)
MCV: 89 fL (ref 78.0–100.0)
RDW: 15.1 % (ref 11.5–15.5)

## 2012-11-10 LAB — BASIC METABOLIC PANEL
BUN: 35 mg/dL — ABNORMAL HIGH (ref 6–23)
Creatinine, Ser: 0.62 mg/dL (ref 0.50–1.35)
GFR calc Af Amer: >90 mL/min (ref >90)
GFR calc non Af Amer: >90 mL/min (ref >90)
Glucose, Bld: 132 mg/dL — ABNORMAL HIGH (ref 70–99)
Potassium: 4.2 mEq/L (ref 3.5–5.1)

## 2012-11-12 DIAGNOSIS — R9431 Abnormal electrocardiogram [ECG] [EKG]: Secondary | ICD-10-CM

## 2012-11-12 LAB — BASIC METABOLIC PANEL
BUN: 32 mg/dL — ABNORMAL HIGH (ref 6–23)
Chloride: 99 mEq/L (ref 96–112)
Creatinine, Ser: 0.61 mg/dL (ref 0.50–1.35)
GFR calc non Af Amer: >90 mL/min (ref >90)
Glucose, Bld: 135 mg/dL — ABNORMAL HIGH (ref 70–99)
Potassium: 4.1 mEq/L (ref 3.5–5.1)

## 2012-11-12 NOTE — Progress Notes (Signed)
  Echocardiogram 2D Echocardiogram has been performed.  Georgian Co 11/12/2012, 11:10 AM

## 2012-11-13 ENCOUNTER — Other Ambulatory Visit (HOSPITAL_COMMUNITY): Payer: Self-pay

## 2012-11-13 DIAGNOSIS — M7989 Other specified soft tissue disorders: Secondary | ICD-10-CM

## 2012-11-13 DIAGNOSIS — M79609 Pain in unspecified limb: Secondary | ICD-10-CM

## 2012-11-13 NOTE — Progress Notes (Signed)
VASCULAR LAB PRELIMINARY  PRELIMINARY  PRELIMINARY  PRELIMINARY  Right upper extremity venous Doppler completed.    Preliminary report:  No SVT or DVT noted in the right upper extremity.  Hoy Fallert, 11/13/2012, 1:33 PM

## 2012-11-14 LAB — HEPATIC FUNCTION PANEL
ALT: 47 U/L (ref 0–53)
AST: 24 U/L (ref 0–37)
Albumin: 2.7 g/dL — ABNORMAL LOW (ref 3.5–5.2)
Bilirubin, Direct: 0.6 mg/dL — ABNORMAL HIGH (ref 0.0–0.3)

## 2012-11-14 LAB — BASIC METABOLIC PANEL
BUN: 31 mg/dL — ABNORMAL HIGH (ref 6–23)
Chloride: 99 mEq/L (ref 96–112)
GFR calc Af Amer: >90 mL/min (ref >90)
GFR calc non Af Amer: >90 mL/min (ref >90)
Glucose, Bld: 160 mg/dL — ABNORMAL HIGH (ref 70–99)
Potassium: 4 mEq/L (ref 3.5–5.1)
Sodium: 134 mEq/L — ABNORMAL LOW (ref 135–145)

## 2012-11-14 LAB — CBC
HCT: 23.1 % — ABNORMAL LOW (ref 39.0–52.0)
Hemoglobin: 7.7 g/dL — ABNORMAL LOW (ref 13.0–17.0)
RDW: 14.4 % (ref 11.5–15.5)
WBC: 7.8 10*3/uL (ref 4.0–10.5)

## 2012-11-15 LAB — HEMOGLOBIN AND HEMATOCRIT, BLOOD
HCT: 23.8 % — ABNORMAL LOW (ref 39.0–52.0)
Hemoglobin: 7.9 g/dL — ABNORMAL LOW (ref 13.0–17.0)

## 2012-11-17 LAB — CBC
HCT: 26.5 % — ABNORMAL LOW (ref 39.0–52.0)
MCV: 89.2 fL (ref 78.0–100.0)
Platelets: 280 10*3/uL (ref 150–400)
RBC: 2.97 MIL/uL — ABNORMAL LOW (ref 4.22–5.81)
RDW: 14.9 % (ref 11.5–15.5)
WBC: 6.7 10*3/uL (ref 4.0–10.5)

## 2012-11-17 LAB — DIFFERENTIAL
Eosinophils Absolute: 0.2 10*3/uL (ref 0.0–0.7)
Lymphs Abs: 1.7 10*3/uL (ref 0.7–4.0)
Monocytes Relative: 13 % — ABNORMAL HIGH (ref 3–12)
Neutro Abs: 4 10*3/uL (ref 1.7–7.7)
Neutrophils Relative %: 59 % (ref 43–77)

## 2012-11-17 LAB — PREALBUMIN: Prealbumin: 44.7 mg/dL — ABNORMAL HIGH (ref 17.0–34.0)

## 2012-11-17 LAB — COMPREHENSIVE METABOLIC PANEL
AST: 57 U/L — ABNORMAL HIGH (ref 0–37)
Albumin: 2.9 g/dL — ABNORMAL LOW (ref 3.5–5.2)
BUN: 31 mg/dL — ABNORMAL HIGH (ref 6–23)
Calcium: 9.7 mg/dL (ref 8.4–10.5)
Chloride: 99 mEq/L (ref 96–112)
Creatinine, Ser: 0.56 mg/dL (ref 0.50–1.35)
Total Bilirubin: 0.9 mg/dL (ref 0.3–1.2)
Total Protein: 7.8 g/dL (ref 6.0–8.3)

## 2012-11-17 LAB — TRIGLYCERIDES: Triglycerides: 143 mg/dL (ref <150)

## 2012-11-17 LAB — MAGNESIUM: Magnesium: 2.1 mg/dL (ref 1.5–2.5)

## 2012-11-17 LAB — CHOLESTEROL, TOTAL: Cholesterol: 125 mg/dL (ref 0–200)

## 2012-11-18 LAB — TYPE AND SCREEN
ABO/RH(D): B POS
Antibody Screen: NEGATIVE
Unit division: 0
Unit division: 0

## 2012-11-20 LAB — COMPREHENSIVE METABOLIC PANEL
ALT: 85 U/L — ABNORMAL HIGH (ref 0–53)
AST: 29 U/L (ref 0–37)
Albumin: 2.8 g/dL — ABNORMAL LOW (ref 3.5–5.2)
Alkaline Phosphatase: 234 U/L — ABNORMAL HIGH (ref 39–117)
CO2: 27 mEq/L (ref 19–32)
Chloride: 98 mEq/L (ref 96–112)
Potassium: 4.2 mEq/L (ref 3.5–5.1)
Total Bilirubin: 0.8 mg/dL (ref 0.3–1.2)

## 2012-11-20 LAB — CBC WITH DIFFERENTIAL/PLATELET
Basophils Absolute: 0 10*3/uL (ref 0.0–0.1)
Basophils Relative: 0 % (ref 0–1)
Hemoglobin: 8.5 g/dL — ABNORMAL LOW (ref 13.0–17.0)
Lymphocytes Relative: 25 % (ref 12–46)
MCHC: 31.6 g/dL (ref 30.0–36.0)
Neutro Abs: 4.4 10*3/uL (ref 1.7–7.7)
Neutrophils Relative %: 59 % (ref 43–77)
RDW: 15.7 % — ABNORMAL HIGH (ref 11.5–15.5)
WBC: 7.5 10*3/uL (ref 4.0–10.5)

## 2012-11-22 LAB — BASIC METABOLIC PANEL
BUN: 27 mg/dL — ABNORMAL HIGH (ref 6–23)
Calcium: 9.8 mg/dL (ref 8.4–10.5)
Creatinine, Ser: 0.54 mg/dL (ref 0.50–1.35)
GFR calc non Af Amer: >90 mL/min (ref >90)
Glucose, Bld: 219 mg/dL — ABNORMAL HIGH (ref 70–99)
Potassium: 4.6 mEq/L (ref 3.5–5.1)

## 2012-11-22 LAB — HEPATIC FUNCTION PANEL
AST: 32 U/L (ref 0–37)
Bilirubin, Direct: 0.5 mg/dL — ABNORMAL HIGH (ref 0.0–0.3)
Indirect Bilirubin: 0.3 mg/dL (ref 0.3–0.9)
Total Bilirubin: 0.8 mg/dL (ref 0.3–1.2)

## 2012-11-22 LAB — CBC
MCH: 28.2 pg (ref 26.0–34.0)
Platelets: 259 10*3/uL (ref 150–400)
RBC: 2.91 MIL/uL — ABNORMAL LOW (ref 4.22–5.81)

## 2012-11-24 LAB — CBC
Hemoglobin: 8.1 g/dL — ABNORMAL LOW (ref 13.0–17.0)
MCH: 28.8 pg (ref 26.0–34.0)
Platelets: 235 10*3/uL (ref 150–400)
RBC: 2.81 MIL/uL — ABNORMAL LOW (ref 4.22–5.81)
WBC: 5.2 10*3/uL (ref 4.0–10.5)

## 2012-11-24 LAB — COMPREHENSIVE METABOLIC PANEL
AST: 45 U/L — ABNORMAL HIGH (ref 0–37)
Albumin: 2.6 g/dL — ABNORMAL LOW (ref 3.5–5.2)
Chloride: 101 mEq/L (ref 96–112)
Creatinine, Ser: 0.56 mg/dL (ref 0.50–1.35)
Total Bilirubin: 0.6 mg/dL (ref 0.3–1.2)
Total Protein: 7.1 g/dL (ref 6.0–8.3)

## 2012-11-24 LAB — DIFFERENTIAL
Lymphocytes Relative: 28 % (ref 12–46)
Lymphs Abs: 1.4 10*3/uL (ref 0.7–4.0)
Monocytes Relative: 14 % — ABNORMAL HIGH (ref 3–12)
Neutro Abs: 2.9 10*3/uL (ref 1.7–7.7)
Neutrophils Relative %: 56 % (ref 43–77)

## 2012-11-24 LAB — MAGNESIUM: Magnesium: 2.1 mg/dL (ref 1.5–2.5)

## 2012-11-24 LAB — TRIGLYCERIDES: Triglycerides: 102 mg/dL (ref <150)

## 2012-11-24 LAB — CHOLESTEROL, TOTAL: Cholesterol: 125 mg/dL (ref 0–200)

## 2012-11-24 LAB — PHOSPHORUS: Phosphorus: 3.6 mg/dL (ref 2.3–4.6)

## 2012-11-25 LAB — CK TOTAL AND CKMB (NOT AT ARMC)
CK, MB: 1.1 ng/mL (ref 0.3–4.0)
Relative Index: INVALID (ref 0.0–2.5)
Total CK: 15 U/L (ref 7–232)

## 2012-11-25 LAB — CK: Total CK: 13 U/L (ref 7–232)

## 2012-11-26 LAB — COMPREHENSIVE METABOLIC PANEL
ALT: 135 U/L — ABNORMAL HIGH (ref 0–53)
AST: 41 U/L — ABNORMAL HIGH (ref 0–37)
CO2: 28 mEq/L (ref 19–32)
Calcium: 9.5 mg/dL (ref 8.4–10.5)
Chloride: 97 mEq/L (ref 96–112)
Creatinine, Ser: 0.54 mg/dL (ref 0.50–1.35)
GFR calc Af Amer: >90 mL/min (ref >90)
GFR calc non Af Amer: >90 mL/min (ref >90)
Glucose, Bld: 160 mg/dL — ABNORMAL HIGH (ref 70–99)
Total Bilirubin: 0.7 mg/dL (ref 0.3–1.2)

## 2012-11-26 LAB — CBC
Hemoglobin: 8.7 g/dL — ABNORMAL LOW (ref 13.0–17.0)
MCH: 28.2 pg (ref 26.0–34.0)
MCV: 88.7 fL (ref 78.0–100.0)
Platelets: 277 10*3/uL (ref 150–400)
RBC: 3.09 MIL/uL — ABNORMAL LOW (ref 4.22–5.81)
WBC: 8.4 10*3/uL (ref 4.0–10.5)

## 2012-11-27 ENCOUNTER — Other Ambulatory Visit (HOSPITAL_COMMUNITY): Payer: Self-pay

## 2012-11-27 LAB — CBC
HCT: 32.2 % — ABNORMAL LOW (ref 39.0–52.0)
MCHC: 32.3 g/dL (ref 30.0–36.0)
RDW: 15.9 % — ABNORMAL HIGH (ref 11.5–15.5)

## 2012-11-27 LAB — COMPREHENSIVE METABOLIC PANEL
BUN: 40 mg/dL — ABNORMAL HIGH (ref 6–23)
CO2: 32 mEq/L (ref 19–32)
Calcium: 10.6 mg/dL — ABNORMAL HIGH (ref 8.4–10.5)
Creatinine, Ser: 0.58 mg/dL (ref 0.50–1.35)
GFR calc Af Amer: >90 mL/min (ref >90)
GFR calc non Af Amer: >90 mL/min (ref >90)
Glucose, Bld: 96 mg/dL (ref 70–99)

## 2012-11-27 LAB — MAGNESIUM: Magnesium: 2.5 mg/dL (ref 1.5–2.5)

## 2012-11-27 LAB — DIFFERENTIAL
Basophils Absolute: 0 10*3/uL (ref 0.0–0.1)
Basophils Relative: 0 % (ref 0–1)
Monocytes Absolute: 1.7 10*3/uL — ABNORMAL HIGH (ref 0.1–1.0)
Neutro Abs: 9.3 10*3/uL — ABNORMAL HIGH (ref 1.7–7.7)

## 2012-11-27 LAB — PREALBUMIN: Prealbumin: 43.8 mg/dL — ABNORMAL HIGH (ref 17.0–34.0)

## 2012-11-27 LAB — PHOSPHORUS: Phosphorus: 4.6 mg/dL (ref 2.3–4.6)

## 2012-11-27 MED ORDER — IOHEXOL 300 MG/ML  SOLN
80.0000 mL | Freq: Once | INTRAMUSCULAR | Status: AC | PRN
  Administered 2012-11-27: 80 mL via INTRAVENOUS

## 2012-11-27 MED ORDER — IOHEXOL 300 MG/ML  SOLN: 20.0000 mL | INTRAMUSCULAR | Status: AC

## 2012-11-28 LAB — CBC WITH DIFFERENTIAL/PLATELET
Basophils Absolute: 0 10*3/uL (ref 0.0–0.1)
Basophils Relative: 0 % (ref 0–1)
Eosinophils Absolute: 0.1 10*3/uL (ref 0.0–0.7)
Eosinophils Relative: 0 % (ref 0–5)
Lymphocytes Relative: 12 % (ref 12–46)
MCH: 28.8 pg (ref 26.0–34.0)
MCV: 89.1 fL (ref 78.0–100.0)
Platelets: 311 10*3/uL (ref 150–400)
RDW: 16.2 % — ABNORMAL HIGH (ref 11.5–15.5)
WBC: 14.9 10*3/uL — ABNORMAL HIGH (ref 4.0–10.5)

## 2012-11-28 LAB — BASIC METABOLIC PANEL
Calcium: 10.4 mg/dL (ref 8.4–10.5)
GFR calc Af Amer: >90 mL/min (ref >90)
GFR calc non Af Amer: >90 mL/min (ref >90)
Sodium: 138 mEq/L (ref 135–145)

## 2012-11-28 LAB — PROCALCITONIN: Procalcitonin: 0.17 ng/mL

## 2012-11-29 LAB — CBC WITH DIFFERENTIAL/PLATELET
Basophils Absolute: 0.1 10*3/uL (ref 0.0–0.1)
Basophils Relative: 1 % (ref 0–1)
Lymphocytes Relative: 15 % (ref 12–46)
Neutro Abs: 8.5 10*3/uL — ABNORMAL HIGH (ref 1.7–7.7)
Platelets: 272 10*3/uL (ref 150–400)
RDW: 16.3 % — ABNORMAL HIGH (ref 11.5–15.5)
WBC: 12.8 10*3/uL — ABNORMAL HIGH (ref 4.0–10.5)

## 2012-11-29 LAB — COMPREHENSIVE METABOLIC PANEL
ALT: 108 U/L — ABNORMAL HIGH (ref 0–53)
AST: 31 U/L (ref 0–37)
Albumin: 3 g/dL — ABNORMAL LOW (ref 3.5–5.2)
CO2: 31 mEq/L (ref 19–32)
Calcium: 10.4 mg/dL (ref 8.4–10.5)
Chloride: 98 mEq/L (ref 96–112)
GFR calc non Af Amer: >90 mL/min (ref >90)
Sodium: 139 mEq/L (ref 135–145)
Total Bilirubin: 1.6 mg/dL — ABNORMAL HIGH (ref 0.3–1.2)

## 2012-11-30 LAB — COMPREHENSIVE METABOLIC PANEL
ALT: 74 U/L — ABNORMAL HIGH (ref 0–53)
AST: 21 U/L (ref 0–37)
Alkaline Phosphatase: 211 U/L — ABNORMAL HIGH (ref 39–117)
CO2: 31 mEq/L (ref 19–32)
Chloride: 102 mEq/L (ref 96–112)
Creatinine, Ser: 0.8 mg/dL (ref 0.50–1.35)
GFR calc non Af Amer: >90 mL/min (ref >90)
Potassium: 4.1 mEq/L (ref 3.5–5.1)
Sodium: 143 mEq/L (ref 135–145)
Total Bilirubin: 1.7 mg/dL — ABNORMAL HIGH (ref 0.3–1.2)

## 2012-11-30 LAB — CBC
MCV: 90.1 fL (ref 78.0–100.0)
Platelets: 230 10*3/uL (ref 150–400)
RBC: 3.12 MIL/uL — ABNORMAL LOW (ref 4.22–5.81)
WBC: 11 10*3/uL — ABNORMAL HIGH (ref 4.0–10.5)

## 2012-12-01 DIAGNOSIS — C786 Secondary malignant neoplasm of retroperitoneum and peritoneum: Secondary | ICD-10-CM

## 2012-12-01 DIAGNOSIS — K56609 Unspecified intestinal obstruction, unspecified as to partial versus complete obstruction: Secondary | ICD-10-CM

## 2012-12-01 DIAGNOSIS — R112 Nausea with vomiting, unspecified: Secondary | ICD-10-CM

## 2012-12-01 LAB — COMPREHENSIVE METABOLIC PANEL
BUN: 50 mg/dL — ABNORMAL HIGH (ref 6–23)
Calcium: 10.4 mg/dL (ref 8.4–10.5)
Creatinine, Ser: 0.68 mg/dL (ref 0.50–1.35)
GFR calc Af Amer: >90 mL/min (ref >90)
GFR calc non Af Amer: >90 mL/min (ref >90)
Glucose, Bld: 235 mg/dL — ABNORMAL HIGH (ref 70–99)
Total Protein: 7.4 g/dL (ref 6.0–8.3)

## 2012-12-01 LAB — MAGNESIUM: Magnesium: 2.3 mg/dL (ref 1.5–2.5)

## 2012-12-01 NOTE — Consult Note (Signed)
Patient interviewed and examined, agree with PA note above. I reviewed his very complex surgical history at [facility]. Recent imaging does not show evidence of a mechanical obstruction. He has thickened bowel loops, possibly postoperative changes. It is unlikely that any further surgery on his abdomen would be indicated for any reason. He fortunately does not appear to have mechanical obstruction and I agree with symptomatic management. If he does not improve we could consider a barium upper GI series to rule out any evidence of obstruction or other pathology. Mariella Saa MD, FACS  12/01/2012 7:32 PM

## 2012-12-01 NOTE — Consult Note (Signed)
  Subjective: 56 y/o male who was [facility] after multiple surgeries at [facility] with Dr. Remus Loffler for peritoneal carcinomatosis.  Pt c/o of vomiting without nausea or abdominal pain since Wednesday.  Pt also c/o heartburn, acid reflux, burping and states he hasn't been able to get any of his PO meds since last Wednesday.  Pt states his abdomen and his 3 drains are continuing to drain and he isn't having any problems.  Pt has been on a clear liquid diet since September and on and off of TPN.    Objective:   PE: Gen:  Alert, NAD, pleasant Card:  RRR, no M/G/R heard Pulm:  CTA, no W/R/R Abd: Soft, NT/ND, +BS, no HSM, 2 drains (LLQ and LUQ with billious output), iliostomy patent with water and some minimal green output, chole tube draining milky fluid Ext:  No erythema, edema, or tenderness  Lab Results:   Basename 11/30/12 0700 11/29/12 0600  WBC 11.0* 12.8*  HGB 8.9* 9.2*  HCT 28.1* 29.1*  PLT 230 272   BMET  Basename 12/01/12 0612 11/30/12 0700  NA 141 143  K 3.8 4.1  CL 104 102  CO2 30 31  GLUCOSE 235* 116*  BUN 50* 52*  CREATININE 0.68 0.80  CALCIUM 10.4 10.6*   PT/INR No results found for this basename: LABPROT:2,INR:2 in the last 72 hours CMP     Component Value Date/Time   NA 141 12/01/2012 0612   K 3.8 12/01/2012 0612   CL 104 12/01/2012 0612   CO2 30 12/01/2012 0612   GLUCOSE 235* 12/01/2012 0612   BUN 50* 12/01/2012 0612   CREATININE 0.68 12/01/2012 0612   CALCIUM 10.4 12/01/2012 0612   PROT 7.4 12/01/2012 0612   ALBUMIN 2.6* 12/01/2012 0612   AST 15 12/01/2012 0612   ALT 57* 12/01/2012 0612   ALKPHOS 183* 12/01/2012 0612   BILITOT 1.1 12/01/2012 0612   GFRNONAA >90 12/01/2012 0612   GFRAA >90 12/01/2012 0612   Lipase     Component Value Date/Time   LIPASE 35 11/07/2011 1734     Studies/Results: CT Abdomen and Pelvis with contrast 11/27/12: IMPRESSION: 1. Cholecystostomy tube in place with a small amount of residual fluid and  air in the gallbladder. A small amount of free air adjacent to the cholecystostomy tube likely leaked around the catheter. 2. Persistent findings for a coloenteric fistula but this appears improved and much less obvious. 3. Stable right lower quadrant colostomy or ileostomy. 4. Diffusely thickened small bowel loops could be inflammatory change or ischemia. No pneumatosis. 5. Left lower quadrant drainage catheter without residual/surrounding fluid.     Assessment/Plan 56 y/o male with vomiting 1.  No surgical cause of vomiting at this time, abdomen is soft, NT/ND.  CT scan shows diffusely thickened small bowel likely inflammatory given no severe pain or peritoneal signs. 2.  Would recommend medication management including: IV protonix 40mg  BID, schedule reglan and zofran and phenergan prn. 3.  May look into GI consult for medical management of vomiting vs sending patient home to follow up with Dr. Remus Loffler - gastroenteritis? Viral vs bacterial -other symptomatic care carafate or H2 blocker 4.  Would not remove drains as they are still producing larger amounts, would defer to Dr. Remus Loffler      LOS: 39 days    DORT, Jaielle Dlouhy 12/01/2012, 2:02 PM Pager: [phone]

## 2012-12-02 LAB — COMPREHENSIVE METABOLIC PANEL
ALT: 56 U/L — ABNORMAL HIGH (ref 0–53)
Albumin: 2.4 g/dL — ABNORMAL LOW (ref 3.5–5.2)
Alkaline Phosphatase: 194 U/L — ABNORMAL HIGH (ref 39–117)
Calcium: 10 mg/dL (ref 8.4–10.5)
Potassium: 3.8 mEq/L (ref 3.5–5.1)
Sodium: 144 mEq/L (ref 135–145)
Total Protein: 7 g/dL (ref 6.0–8.3)

## 2012-12-02 LAB — PROCALCITONIN: Procalcitonin: <0.1 ng/mL

## 2012-12-02 NOTE — Progress Notes (Signed)
  Subjective: Pt feeling "like a new man" today.  Pt states he thinks the medications we tried yesterday are working.  He states he is hungry/thirsty and his stomach is growling.  Pt would like to start eating if possible.  Pt states he hasn't vomited since yesterday.  Denies abdominal pain or nausea.  Pt ambulates with PT/OT.    Objective: PE: Gen:  Alert, NAD, pleasant Card:  RRR, no M/G/R heard Pulm:  CTA, no W/R/R Abd: Soft, NT/ND, +BS, no HSM, 2 drains (LLQ and LUQ with billious output), iliostomy patent with water and some minimal green output, chole tube draining milky fluid Ext:  No erythema, edema, or tenderness  Lab Results:   Basename 11/30/12 0700  WBC 11.0*  HGB 8.9*  HCT 28.1*  PLT 230   BMET  Basename 12/02/12 0741 12/01/12 0612  NA 144 141  K 3.8 3.8  CL 108 104  CO2 25 30  GLUCOSE 502* 235*  BUN 46* 50*  CREATININE 0.65 0.68  CALCIUM 10.0 10.4   PT/INR No results found for this basename: LABPROT:2,INR:2 in the last 72 hours CMP     Component Value Date/Time   NA 144 12/02/2012 0741   K 3.8 12/02/2012 0741   CL 108 12/02/2012 0741   CO2 25 12/02/2012 0741   GLUCOSE 502* 12/02/2012 0741   BUN 46* 12/02/2012 0741   CREATININE 0.65 12/02/2012 0741   CALCIUM 10.0 12/02/2012 0741   PROT 7.0 12/02/2012 0741   ALBUMIN 2.4* 12/02/2012 0741   AST 20 12/02/2012 0741   ALT 56* 12/02/2012 0741   ALKPHOS 194* 12/02/2012 0741   BILITOT 0.8 12/02/2012 0741   GFRNONAA >90 12/02/2012 0741   GFRAA >90 12/02/2012 0741   Lipase     Component Value Date/Time   LIPASE 35 11/07/2011 1734     Assessment/Plan 56 y/o male with vomiting, much improved from yesterday with combo IV protonix, zofran, reglan, and phenergan 1. No surgical cause of vomiting at this time, abdomen is soft, NT/ND. CT scan shows diffusely thickened small bowel likely inflammatory given no severe pain or peritoneal signs. Cont medication management. 2.  Pt to have family meeting later today  to decide if placement home vs continued stay at LTC 3.  If patient goes home family should be instructed on how to care for drains including emptying, measuring/documentation of volumes and wound care for existing wounds 4.  The patient will need follow up with Dr. Remus Loffler upon discharge for drain removal as drain output is still too high to remove 5.  As per Dr. Johna Sheriff, primary service can consider barium upper GI series to r/o obstruction vs other pathology if still concerned.   6.  Would likely be fine to start on clears to see how the patient tolerates it and progress to soft diet as tolerated. 7.  Will sign off for now, call for questions/concerns.   LOS: 40 days    DORT, Aundra Millet 12/02/2012, 10:46 AM Pager: [phone]

## 2012-12-03 LAB — COMPREHENSIVE METABOLIC PANEL
ALT: 79 U/L — ABNORMAL HIGH (ref 0–53)
Alkaline Phosphatase: 193 U/L — ABNORMAL HIGH (ref 39–117)
BUN: 43 mg/dL — ABNORMAL HIGH (ref 6–23)
CO2: 24 mEq/L (ref 19–32)
Chloride: 115 mEq/L — ABNORMAL HIGH (ref 96–112)
GFR calc Af Amer: >90 mL/min (ref >90)
GFR calc non Af Amer: >90 mL/min (ref >90)
Glucose, Bld: 542 mg/dL — ABNORMAL HIGH (ref 70–99)
Potassium: 4.2 mEq/L (ref 3.5–5.1)
Sodium: 148 mEq/L — ABNORMAL HIGH (ref 135–145)
Total Bilirubin: 0.7 mg/dL (ref 0.3–1.2)
Total Protein: 6.8 g/dL (ref 6.0–8.3)

## 2012-12-03 LAB — CBC WITH DIFFERENTIAL/PLATELET
Eosinophils Absolute: 0 10*3/uL (ref 0.0–0.7)
Hemoglobin: 8 g/dL — ABNORMAL LOW (ref 13.0–17.0)
Lymphocytes Relative: 6 % — ABNORMAL LOW (ref 12–46)
Lymphs Abs: 0.4 10*3/uL — ABNORMAL LOW (ref 0.7–4.0)
MCH: 28.7 pg (ref 26.0–34.0)
MCV: 93.2 fL (ref 78.0–100.0)
Monocytes Relative: 12 % (ref 3–12)
Neutrophils Relative %: 82 % — ABNORMAL HIGH (ref 43–77)
Platelets: 243 10*3/uL (ref 150–400)
RBC: 2.79 MIL/uL — ABNORMAL LOW (ref 4.22–5.81)
WBC: 6.6 10*3/uL (ref 4.0–10.5)

## 2012-12-04 LAB — COMPREHENSIVE METABOLIC PANEL
ALT: 87 U/L — ABNORMAL HIGH (ref 0–53)
AST: 31 U/L (ref 0–37)
Albumin: 2.3 g/dL — ABNORMAL LOW (ref 3.5–5.2)
Calcium: 9.8 mg/dL (ref 8.4–10.5)
Creatinine, Ser: 0.69 mg/dL (ref 0.50–1.35)
GFR calc non Af Amer: >90 mL/min (ref >90)
Sodium: 150 mEq/L — ABNORMAL HIGH (ref 135–145)
Total Protein: 6.5 g/dL (ref 6.0–8.3)

## 2012-12-05 LAB — BASIC METABOLIC PANEL
BUN: 35 mg/dL — ABNORMAL HIGH (ref 6–23)
CO2: 25 mEq/L (ref 19–32)
Calcium: 9.3 mg/dL (ref 8.4–10.5)
GFR calc non Af Amer: >90 mL/min (ref >90)
Glucose, Bld: 262 mg/dL — ABNORMAL HIGH (ref 70–99)
Sodium: 146 mEq/L — ABNORMAL HIGH (ref 135–145)

## 2012-12-05 LAB — CBC
HCT: 27.1 % — ABNORMAL LOW (ref 39.0–52.0)
Hemoglobin: 8.2 g/dL — ABNORMAL LOW (ref 13.0–17.0)
MCH: 28.1 pg (ref 26.0–34.0)
MCHC: 30.3 g/dL (ref 30.0–36.0)
RBC: 2.92 MIL/uL — ABNORMAL LOW (ref 4.22–5.81)

## 2012-12-06 LAB — TRIGLYCERIDES: Triglycerides: 87 mg/dL (ref <150)

## 2012-12-06 LAB — PREALBUMIN: Prealbumin: 36.3 mg/dL — ABNORMAL HIGH (ref 17.0–34.0)

## 2012-12-06 LAB — PHOSPHORUS: Phosphorus: 3.1 mg/dL (ref 2.3–4.6)

## 2012-12-07 ENCOUNTER — Other Ambulatory Visit (HOSPITAL_COMMUNITY): Payer: Self-pay

## 2012-12-07 LAB — CBC
Hemoglobin: 8.9 g/dL — ABNORMAL LOW (ref 13.0–17.0)
MCH: 28.4 pg (ref 26.0–34.0)
MCHC: 32.7 g/dL (ref 30.0–36.0)
Platelets: 239 10*3/uL (ref 150–400)
RBC: 3.13 MIL/uL — ABNORMAL LOW (ref 4.22–5.81)

## 2012-12-07 LAB — BASIC METABOLIC PANEL
CO2: 23 mEq/L (ref 19–32)
Calcium: 8.3 mg/dL — ABNORMAL LOW (ref 8.4–10.5)
GFR calc non Af Amer: >90 mL/min (ref >90)
Glucose, Bld: 57 mg/dL — ABNORMAL LOW (ref 70–99)
Potassium: 3.1 mEq/L — ABNORMAL LOW (ref 3.5–5.1)
Sodium: 142 mEq/L (ref 135–145)

## 2012-12-08 DIAGNOSIS — R1110 Vomiting, unspecified: Secondary | ICD-10-CM

## 2012-12-08 LAB — DIFFERENTIAL
Eosinophils Absolute: 0.1 10*3/uL (ref 0.0–0.7)
Eosinophils Relative: 1 % (ref 0–5)
Lymphs Abs: 0.8 10*3/uL (ref 0.7–4.0)
Monocytes Absolute: 0.7 10*3/uL (ref 0.1–1.0)
Monocytes Relative: 12 % (ref 3–12)

## 2012-12-08 LAB — COMPREHENSIVE METABOLIC PANEL
ALT: 59 U/L — ABNORMAL HIGH (ref 0–53)
Albumin: 1.9 g/dL — ABNORMAL LOW (ref 3.5–5.2)
Alkaline Phosphatase: 143 U/L — ABNORMAL HIGH (ref 39–117)
Chloride: 110 mEq/L (ref 96–112)
Potassium: 3.2 mEq/L — ABNORMAL LOW (ref 3.5–5.1)
Sodium: 141 mEq/L (ref 135–145)
Total Bilirubin: 0.5 mg/dL (ref 0.3–1.2)
Total Protein: 5.1 g/dL — ABNORMAL LOW (ref 6.0–8.3)

## 2012-12-08 LAB — CBC
HCT: 23.6 % — ABNORMAL LOW (ref 39.0–52.0)
Hemoglobin: 7.6 g/dL — ABNORMAL LOW (ref 13.0–17.0)
MCH: 28.3 pg (ref 26.0–34.0)
MCHC: 32.2 g/dL (ref 30.0–36.0)
MCV: 87.7 fL (ref 78.0–100.0)
RBC: 2.69 MIL/uL — ABNORMAL LOW (ref 4.22–5.81)

## 2012-12-08 LAB — CHOLESTEROL, TOTAL: Cholesterol: 89 mg/dL (ref 0–200)

## 2012-12-08 NOTE — Progress Notes (Signed)
Pt seen and examined.  Xrays reviewed.  Very complex medical history summarized in Paula Guenther's note.  Main GI problem is recurrent vomiting.  Denies abdominal pain or nausea.  He claims to have little output per ileostomy.  Examination of the abdomen is very limited.  Etiology of vomiting may be multifactorial.  A mechanical obstruction should be r/oed, especially in view of limited ostomy output.  CNS mets must also be considered.  Doubt secondary to meds.  Recomment 1.  UGI/SBFT 2.  Head CT if #1 is negative 

## 2012-12-09 ENCOUNTER — Other Ambulatory Visit (HOSPITAL_COMMUNITY): Payer: Self-pay

## 2012-12-09 LAB — CBC
HCT: 32 % — ABNORMAL LOW (ref 39.0–52.0)
MCV: 90.9 fL (ref 78.0–100.0)
RBC: 3.52 MIL/uL — ABNORMAL LOW (ref 4.22–5.81)
WBC: 8.1 10*3/uL (ref 4.0–10.5)

## 2012-12-09 LAB — BASIC METABOLIC PANEL
BUN: 27 mg/dL — ABNORMAL HIGH (ref 6–23)
CO2: 27 mEq/L (ref 19–32)
Chloride: 101 mEq/L (ref 96–112)
Creatinine, Ser: 0.59 mg/dL (ref 0.50–1.35)

## 2012-12-10 LAB — BASIC METABOLIC PANEL
CO2: 27 mEq/L (ref 19–32)
Calcium: 8.8 mg/dL (ref 8.4–10.5)
Creatinine, Ser: 0.55 mg/dL (ref 0.50–1.35)
Glucose, Bld: 101 mg/dL — ABNORMAL HIGH (ref 70–99)

## 2012-12-10 LAB — TYPE AND SCREEN
Antibody Screen: NEGATIVE
Unit division: 0

## 2012-12-10 NOTE — Progress Notes (Signed)
I saw the patient with MD,PA and discussed with Dr Arlyce Dice. Do not believe he is an operative candidate given his prior surgical complications. Dr Arlyce Dice will attempt to stint the obstruction. Doing so may lead to increased fistula output so that will need to be monitored.  Will be available if other problems arise

## 2012-12-10 NOTE — Progress Notes (Addendum)
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 Pt c/o of intermittent N/V.  He was vomiting a few days ago so an NG tube was placed and he was put on NPO.  Since then his N/V has resolved.   Pt has been on a clear liquid diet since September and on and off of TPN.   Pt had a upper GI study done today which showed a high grade obstruction.  We were consulted to see if surgical interventions were necessary.  During the interview, the NP Gunnar Fusi) from [facility] proposed that Dr. Arlyce Dice would like to try a stent for the obstruction if surgery wasn't indicated.     Objective: PE: Gen: Alert, NAD, pleasant  Card: RRR, no M/G/R heard  Pulm: CTA, no W/R/R, low effort, IS at 750 Abd: Soft, NT/ND, +BS, no HSM, 2 drains (LLQ and LUQ with minimal billious output), iliostomy patent with water and some minimal green output, chole tube draining yellowish fluid, NG with bilious output. Ext: No erythema, edema, or tenderness   Lab Results:   Basename 12/09/12 0500 12/08/12 0519  WBC 8.1 5.5  HGB 9.8* 7.6*  HCT 32.0* 23.6*  PLT 192 188   BMET  Basename 12/10/12 0650 12/09/12 1030  NA 138 137  K 3.4* 3.9  CL 103 101  CO2 27 27  GLUCOSE 101* 95  BUN 29* 27*  CREATININE 0.55 0.59  CALCIUM 8.8 9.5   PT/INR No results found for this basename: LABPROT:2,INR:2 in the last 72 hours CMP     Component Value Date/Time   NA 138 12/10/2012 0650   K 3.4* 12/10/2012 0650   CL 103 12/10/2012 0650   CO2 27 12/10/2012 0650   GLUCOSE 101* 12/10/2012 0650   BUN 29* 12/10/2012 0650   CREATININE 0.55 12/10/2012 0650   CALCIUM 8.8 12/10/2012 0650   PROT 5.1* 12/08/2012 0519   ALBUMIN 1.9* 12/08/2012 0519   AST 15 12/08/2012 0519   ALT 59* 12/08/2012 0519   ALKPHOS 143* 12/08/2012 0519   BILITOT 0.5 12/08/2012 0519   GFRNONAA >90 12/10/2012 0650   GFRAA >90 12/10/2012 0650   Lipase     Component Value  Date/Time   LIPASE 35 11/07/2011 1734       Studies/Results: Dg Abd Portable 1v  12/09/2012  *RADIOLOGY REPORT*  Clinical Data: Nasogastric tube placement.  PORTABLE ABDOMEN - 1 VIEW  Comparison: Abdominal radiograph performed 12/07/2012  Findings: The patient's enteric tube is seen ending overlying the body of the stomach.  The visualized bowel gas pattern is grossly unremarkable.  An apparent ostomy site is noted at the right lower quadrant.  A small right-sided IJ catheter is unchanged in position.  Left-sided drains are also grossly stable.  No acute osseous abnormalities are identified.  Mild sclerotic change is seen at the sacroiliac joints.  IMPRESSION: Enteric tube noted ending overlying the body of the stomach.   Original Report Authenticated By: Tonia Ghent, M.D.    Dg Kayleen Memos W/small Bowel  12/09/2012  *RADIOLOGY REPORT*  Clinical Data:Persistent vomiting.  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 Thin barium also used.  Subsequently, serial images of the small bowel were obtained including spot views of the terminal ileum.  Fluoroscopy Time: 4.2-minute  Comparison: CT abdomen 11/27/2012  Findings: Thin  barium was injected through the indwelling NG tube into  the stomach.  The stomach is mildly dilated.  The duodenum is markedly dilated.  There is a transition point at the ligament of Treitz with a stricture present at the ligament of Treitz.  There is very little contrast in the proximal jejunum.  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  Delayed images were obtained for 1 hour 15 minutes showing only a small amount of barium in the proximal jejunum.  IMPRESSION: High-grade obstruction at the ligament of Treitz with dilatation of the duodenum.  This is most likely due to peritoneal tumor  obstructing the proximal jejunum.  I discussed the findings with Willette Cluster, PA [facility]   Original Report Authenticated By: Janeece Riggers, M.D.       Assessment/Plan 56 y/o male with likely SB obstruction likely due to tumor 1.  Likely no surgical interventions indicated due to complexity of the patients abdomen 2.  Dr. Arlyce Dice plans on doing a endoscopic stent procedure to try and open the obstruction on Thursday?, Paula NP already placed orders-pt agreeable to procedure 3.  Long discussion with patient, wife, and brother regarding his options (do nothing other than symptomatic care, consulting Dr. Remus Loffler, consulting [facility], or stent procedure.  Pt does not want to return to Dr. Remus Loffler.  I explained that he is not likely a surgical candidate due to complexity of his abdomen and risks involved in surgery, pt is agreeable to this.  Pt understands to some extend that the stent may or may not work and may be limited in its ability to resolve the issue long term.  He understands that the obstruction is likely the tumor/cancer as the cause and he may continue to get worse.  I tried to make it evident that he may need to start thinking about quality of life issues if we were not successful in our treatments.  Dr. Deno Etienne plans on having end of life issues talk with the patient.      LOS: 48 days    DORT, Aundra Millet 12/10/2012, 12:24 PM Pager: [phone]    Addendum: 1.  Would hold off on taking his drains/cholecystostomy tube at this point until after stenting procedure and we know the patient can eat.   2.  If output in LUQ/LLQ remains low can possibly pull those 2 drains 3.  If chole output is low, could order a scan to ensure no stone in the CBD and ensure CBD patency then pull out chole tube.

## 2012-12-11 ENCOUNTER — Other Ambulatory Visit (HOSPITAL_COMMUNITY): Payer: Self-pay

## 2012-12-11 ENCOUNTER — Encounter: Payer: Self-pay | Admitting: Gastroenterology

## 2012-12-11 ENCOUNTER — Encounter
Admission: AD | Disposition: A | Discharge status: Other Acute Inpatient Hospital | Payer: Self-pay | Source: Ambulatory Visit | Attending: Internal Medicine

## 2012-12-11 HISTORY — PX: ESOPHAGOGASTRODUODENOSCOPY: SHX5428

## 2012-12-11 HISTORY — PX: DUODENAL STENT PLACEMENT: SHX5541

## 2012-12-11 LAB — COMPREHENSIVE METABOLIC PANEL
Alkaline Phosphatase: 176 U/L — ABNORMAL HIGH (ref 39–117)
BUN: 28 mg/dL — ABNORMAL HIGH (ref 6–23)
Calcium: 8.7 mg/dL (ref 8.4–10.5)
GFR calc Af Amer: >90 mL/min (ref >90)
GFR calc non Af Amer: >90 mL/min (ref >90)
Glucose, Bld: 99 mg/dL (ref 70–99)
Potassium: 4 mEq/L (ref 3.5–5.1)
Total Protein: 5.8 g/dL — ABNORMAL LOW (ref 6.0–8.3)

## 2012-12-11 LAB — MAGNESIUM: Magnesium: 2 mg/dL (ref 1.5–2.5)

## 2012-12-11 LAB — PROTIME-INR: INR: 1.16 (ref 0.00–1.49)

## 2012-12-11 SURGERY — EGD (ESOPHAGOGASTRODUODENOSCOPY)
Anesthesia: Moderate Sedation

## 2012-12-11 MED ORDER — GLUCAGON HCL (RDNA) 1 MG IJ SOLR
INTRAMUSCULAR | Status: AC
  Filled 2012-12-11: qty 1

## 2012-12-11 MED ORDER — BUTAMBEN-TETRACAINE-BENZOCAINE 2-2-14 % EX AERO
INHALATION_SPRAY | CUTANEOUS | Status: DC | PRN
  Administered 2012-12-11: 2 via TOPICAL

## 2012-12-11 MED ORDER — FENTANYL CITRATE 0.05 MG/ML IJ SOLN
INTRAMUSCULAR | Status: AC
  Filled 2012-12-11: qty 2

## 2012-12-11 MED ORDER — MIDAZOLAM HCL 5 MG/5ML IJ SOLN
INTRAMUSCULAR | Status: DC | PRN
  Administered 2012-12-11 (×3): 2 mg via INTRAVENOUS
  Administered 2012-12-11: 1 mg via INTRAVENOUS
  Administered 2012-12-11 (×4): 2 mg via INTRAVENOUS

## 2012-12-11 MED ORDER — GLYCOPYRROLATE 0.2 MG/ML IJ SOLN
INTRAMUSCULAR | Status: DC | PRN
  Administered 2012-12-11: 0.2 mg via INTRAVENOUS

## 2012-12-11 MED ORDER — SODIUM CHLORIDE 0.9 % IV SOLN
INTRAVENOUS | Status: DC
  Administered 2012-12-11 – 2012-12-12 (×2): 500 mL via INTRAVENOUS

## 2012-12-11 MED ORDER — DIPHENHYDRAMINE HCL 50 MG/ML IJ SOLN
INTRAMUSCULAR | Status: AC
  Filled 2012-12-11: qty 1

## 2012-12-11 MED ORDER — FENTANYL CITRATE 0.05 MG/ML IJ SOLN
INTRAMUSCULAR | Status: DC | PRN
  Administered 2012-12-11 (×7): 25 ug via INTRAVENOUS

## 2012-12-11 MED ORDER — MIDAZOLAM HCL 5 MG/ML IJ SOLN
INTRAMUSCULAR | Status: AC
  Filled 2012-12-11: qty 2

## 2012-12-11 MED ORDER — GLYCOPYRROLATE 0.2 MG/ML IJ SOLN
INTRAMUSCULAR | Status: AC
  Filled 2012-12-11: qty 1

## 2012-12-11 MED ORDER — SODIUM CHLORIDE 0.45 % IV SOLN: INTRAVENOUS | Status: DC

## 2012-12-11 MED ORDER — DIPHENHYDRAMINE HCL 50 MG/ML IJ SOLN
INTRAMUSCULAR | Status: DC | PRN
  Administered 2012-12-11 (×2): 12.5 mg via INTRAVENOUS

## 2012-12-11 NOTE — H&P (View-Only) (Signed)
Pt seen and examined.  Xrays reviewed.  Very complex medical history summarized in [place] [facility]'s note.  Main GI problem is recurrent vomiting.  Denies abdominal pain or nausea.  He claims to have little output per ileostomy.  Examination of the abdomen is very limited.  Etiology of vomiting may be multifactorial.  A mechanical obstruction should be r/oed, especially in view of limited ostomy output.  CNS mets must also be considered.  Doubt secondary to meds.  Recomment 1.  UGI/SBFT 2.  Head CT if #1 is negative

## 2012-12-11 NOTE — Op Note (Signed)
[name] [address] [place], [address]   OPERATIVE PROCEDURE REPORT  PATIENT: [name], [name]  MR#: [id number] BIRTHDATE: 05-04-1956 , 56  yrs. old GENDER: Male ENDOSCOPIST: Louis Meckel, MD REFERRED BY: PROCEDURE DATE: 12/11/2012 PROCEDURE:   Diagnostic small bowel enteroscopy ASA CLASS:   Class IV INDICATIONS: MEDICATIONS: These medications were titrated to patient response per physician's verbal order, Versed 15 mg IV, Fentanyl-Quick Pick, Fentanyl-Detailed 200 mcg IV, Benadryl 50 mg IV, and Robinul 0.2 mg IV TOPICAL ANESTHETIC:   Cetacaine Spray  DESCRIPTION OF PROCEDURE:   After the risks benefits and alternatives of the procedure were thoroughly explained, informed consent was obtained.  The [id number] ([id number]), [id number] ([id number]), and EC-[id number] ([id number])  endoscope was introduced through the mouth  and advanced to the proximal jejunum jejunum , limited by Without limitations.   The instrument was slowly withdrawn as the mucosa was fully examined.    The pediatric colonoscope was inserted to the third portion of the duodenum. Extrinsic compression was noted. With difficulty the scope was passed through the area of compression. I was able to pass a 0.35 mm guidewire to the area of obstruction. However, after multiple attempts at passing a wire guided stent, the wire repeatedly shortened and the area of extrinsic compression could not be traversed by the stent. There was no attempt at deploying the stent. This was attempted several times. A double channel adult colonoscope was passed. This scope could not traverse the area of compression and the wire could not be passed through the area of extrinsic compression. Scope was then withdrawn.        Retroflexed views revealed no abnormalities.    The scope was then withdrawn from the patient and the procedure terminated.  COMPLICATIONS: There were no complications. 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 Unsuccessful stenting of the area of obstruction RECOMMENDATIONS: Will discuss with surgery REPEAT EXAM:  _______________________________ eSignedLouis Meckel, MD 12/11/2012 11:50 AM   CC:

## 2012-12-11 NOTE — Progress Notes (Signed)
Unfortunately I was unable to raise a duodenal stent across the area of obstruction. Options are limited. We'll discuss with surgery.

## 2012-12-11 NOTE — Interval H&P Note (Signed)
History and Physical Interval Note:  12/11/2012 11:52 AM  [name]  has presented today for surgery, with the diagnosis of duodenal obstruction  The various methods of treatment have been discussed with the patient and family. After consideration of risks, benefits and other options for treatment, the patient has consented to  Procedure(s) (LRB) with comments: ESOPHAGOGASTRODUODENOSCOPY (EGD) (N/A) DUODENAL STENT PLACEMENT (N/A) as a surgical intervention .  The patient's history has been reviewed, patient examined, no change in status, stable for surgery.  I have reviewed the patient's chart and labs.  Questions were answered to the patient's satisfaction.     The recent H&P (dated *12/08/12**) was reviewed, the patient was examined and there is no change in the patients condition since that H&P was completed.   [name]  12/11/2012, 11:52 AM   [name]

## 2012-12-12 ENCOUNTER — Encounter
Admission: AD | Disposition: A | Discharge status: Other Acute Inpatient Hospital | Payer: Self-pay | Source: Ambulatory Visit | Attending: Internal Medicine

## 2012-12-12 ENCOUNTER — Encounter (HOSPITAL_COMMUNITY): Payer: Self-pay | Admitting: Gastroenterology

## 2012-12-12 ENCOUNTER — Other Ambulatory Visit (HOSPITAL_COMMUNITY): Payer: Self-pay

## 2012-12-12 HISTORY — PX: PEG PLACEMENT: SHX5437

## 2012-12-12 LAB — COMPREHENSIVE METABOLIC PANEL
AST: 9 U/L (ref 0–37)
Albumin: 2.3 g/dL — ABNORMAL LOW (ref 3.5–5.2)
Alkaline Phosphatase: 190 U/L — ABNORMAL HIGH (ref 39–117)
Chloride: 103 mEq/L (ref 96–112)
Creatinine, Ser: 0.54 mg/dL (ref 0.50–1.35)
Potassium: 4 mEq/L (ref 3.5–5.1)
Sodium: 137 mEq/L (ref 135–145)
Total Bilirubin: 0.7 mg/dL (ref 0.3–1.2)

## 2012-12-12 SURGERY — INSERTION, PEG TUBE
Anesthesia: Moderate Sedation

## 2012-12-12 MED ORDER — CEFAZOLIN SODIUM-DEXTROSE 2-3 GM-% IV SOLR
2.0000 g | Freq: Once | INTRAVENOUS | Status: AC
  Administered 2012-12-12: 2 g via INTRAVENOUS
  Filled 2012-12-12: qty 50

## 2012-12-12 MED ORDER — FENTANYL CITRATE 0.05 MG/ML IJ SOLN
INTRAMUSCULAR | Status: DC | PRN
  Administered 2012-12-12: 10 ug via INTRAVENOUS
  Administered 2012-12-12 (×2): 25 ug via INTRAVENOUS

## 2012-12-12 MED ORDER — GLYCOPYRROLATE 0.2 MG/ML IJ SOLN
INTRAMUSCULAR | Status: DC | PRN
  Administered 2012-12-12: 0.2 mg via INTRAVENOUS

## 2012-12-12 MED ORDER — MIDAZOLAM HCL 10 MG/2ML IJ SOLN
INTRAMUSCULAR | Status: DC | PRN
  Administered 2012-12-12 (×3): 2 mg via INTRAVENOUS

## 2012-12-12 MED ORDER — LIDOCAINE HCL 2 % IJ SOLN
INTRAMUSCULAR | Status: AC
  Filled 2012-12-12: qty 20

## 2012-12-12 MED ORDER — MIDAZOLAM HCL 5 MG/ML IJ SOLN
INTRAMUSCULAR | Status: AC
  Filled 2012-12-12: qty 2

## 2012-12-12 MED ORDER — DIPHENHYDRAMINE HCL 50 MG/ML IJ SOLN
INTRAMUSCULAR | Status: AC
  Filled 2012-12-12: qty 1

## 2012-12-12 MED ORDER — FENTANYL CITRATE 0.05 MG/ML IJ SOLN
INTRAMUSCULAR | Status: AC
  Filled 2012-12-12: qty 2

## 2012-12-12 MED ORDER — DIPHENHYDRAMINE HCL 50 MG/ML IJ SOLN
INTRAMUSCULAR | Status: DC | PRN
  Administered 2012-12-12: 25 mg via INTRAVENOUS

## 2012-12-12 MED ORDER — BUTAMBEN-TETRACAINE-BENZOCAINE 2-2-14 % EX AERO
INHALATION_SPRAY | CUTANEOUS | Status: DC | PRN
  Administered 2012-12-12: 2 via TOPICAL

## 2012-12-12 MED ORDER — GLYCOPYRROLATE 0.2 MG/ML IJ SOLN
INTRAMUSCULAR | Status: AC
  Filled 2012-12-12: qty 1

## 2012-12-12 NOTE — Op Note (Signed)
[name] [address] [place], [address]   ENDOSCOPY PROCEDURE REPORT  PATIENT: Basem, Yannuzzi  MR#: [id number] BIRTHDATE: 11-01-1956 , 56  yrs. old GENDER: Male ENDOSCOPIST: Louis Meckel, MD REFERRED BY: PROCEDURE DATE:  12/12/2012 PROCEDURE:  EGD, diagnostic ASA CLASS:     Class III INDICATIONS:  PEG   placement MEDICATIONS: These medications were titrated to patient response per physician's verbal order, Versed 6 mg IV, Fentanyl-Detailed 60 mcg IV, and Robinul 0.2 mg IV TOPICAL ANESTHETIC: Cetacaine Spray  DESCRIPTION OF PROCEDURE: After the risks benefits and alternatives of the procedure were thoroughly explained, informed consent was obtained.  The Pentax Gastroscope [id number] endoscope was introduced through the mouth and advanced to the third portion of the duodenum. Without limitations.  The instrument was slowly withdrawn as the mucosa was fully examined.      The upper, middle and distal third of the esophagus were carefully inspected and no abnormalities were noted. There was dilatation of the second and third portions of the duodenum. The z-line was well seen at the GEJ.  The endoscope was pushed into the fundus which was normal including a retroflexed view.  The antrum, gastric body, and antrum were normal. Despite fully fullyinsufflating the stomach I was unable to find a point of approximation of the stomach to the anterior bowel wall. Accordingly, PEG was not attempted. The scope was then withdrawn from the patient and the procedure completed.  COMPLICATIONS: There were no complications. ENDOSCOPIC IMPRESSION: Unable to place PEG SBO  RECOMMENDATIONS: Reattempt small bowel stent placement REPEAT EXAM:  eSigned:  Louis Meckel, MD 12/12/2012 12:30 PM   CC:

## 2012-12-12 NOTE — Progress Notes (Signed)
Unable to place gastrostomy tube.  I think it's worthwhile reattempting small bowel stent placement. This would best be done with sedation with propofol which may maximize conditions. I will schedule for a.m.

## 2012-12-12 NOTE — H&P (View-Only) (Signed)
Unfortunately I was unable to raise a duodenal stent across the area of obstruction. Options are limited. We'll discuss with surgery. 

## 2012-12-12 NOTE — Interval H&P Note (Signed)
History and Physical Interval Note:  12/12/2012 11:46 AM  [name]  has presented today for surgery, with the diagnosis of pt has bowel obstruction, g tube is for decompression  The various methods of treatment have been discussed with the patient and family. After consideration of risks, benefits and other options for treatment, the patient has consented to  Procedure(s) (LRB) with comments: PERCUTANEOUS ENDOSCOPIC GASTROSTOMY (PEG) PLACEMENT (N/A) as a surgical intervention .  The patient's history has been reviewed, patient examined, no change in status, stable for surgery.  I have reviewed the patient's chart and labs.  Questions were answered to the patient's satisfaction.     The recent H&P (dated *12/11/12*) was reviewed, the patient was examined and there is no change in the patients condition since that H&P was completed.   Melvia Heaps  12/12/2012, 11:46 AM   Melvia Heaps

## 2012-12-13 ENCOUNTER — Other Ambulatory Visit (HOSPITAL_COMMUNITY): Payer: Self-pay

## 2012-12-13 ENCOUNTER — Encounter (HOSPITAL_COMMUNITY): Payer: Self-pay | Admitting: Anesthesiology

## 2012-12-13 ENCOUNTER — Encounter
Admission: AD | Disposition: A | Discharge status: Other Acute Inpatient Hospital | Payer: Self-pay | Source: Ambulatory Visit | Attending: Internal Medicine

## 2012-12-13 ENCOUNTER — Ambulatory Visit (HOSPITAL_COMMUNITY): Payer: Self-pay | Admitting: Anesthesiology

## 2012-12-13 HISTORY — PX: ESOPHAGOGASTRODUODENOSCOPY: SHX5428

## 2012-12-13 LAB — BASIC METABOLIC PANEL
CO2: 25 mEq/L (ref 19–32)
Calcium: 8.5 mg/dL (ref 8.4–10.5)
Creatinine, Ser: 0.51 mg/dL (ref 0.50–1.35)
GFR calc Af Amer: >90 mL/min (ref >90)
GFR calc non Af Amer: >90 mL/min (ref >90)
Sodium: 135 mEq/L (ref 135–145)

## 2012-12-13 LAB — CBC
MCH: 28.2 pg (ref 26.0–34.0)
MCHC: 32.5 g/dL (ref 30.0–36.0)
Platelets: 171 10*3/uL (ref 150–400)
RBC: 3.33 MIL/uL — ABNORMAL LOW (ref 4.22–5.81)
RDW: 15.2 % (ref 11.5–15.5)

## 2012-12-13 SURGERY — EGD (ESOPHAGOGASTRODUODENOSCOPY)
Anesthesia: Monitor Anesthesia Care

## 2012-12-13 MED ORDER — GLUCAGON HCL (RDNA) 1 MG IJ SOLR
1.0000 mg | Freq: Once | INTRAMUSCULAR | Status: AC | PRN
  Administered 2012-12-13: 1 mg via INTRAVENOUS
  Filled 2012-12-13: qty 1

## 2012-12-13 MED ORDER — FENTANYL CITRATE 0.05 MG/ML IJ SOLN: 25.0000 ug | INTRAMUSCULAR | Status: DC | PRN

## 2012-12-13 MED ORDER — OXYCODONE HCL 5 MG/5ML PO SOLN: 5.0000 mg | Freq: Once | ORAL | Status: AC | PRN

## 2012-12-13 MED ORDER — SODIUM CHLORIDE 0.9 % IV SOLN
INTRAVENOUS | Status: DC | PRN
  Administered 2012-12-13: 16:00:00

## 2012-12-13 MED ORDER — GLUCAGON HCL (RDNA) 1 MG IJ SOLR: 1.0000 mg | Freq: Once | INTRAMUSCULAR | Status: AC | PRN

## 2012-12-13 MED ORDER — PROPOFOL INFUSION 10 MG/ML OPTIME
INTRAVENOUS | Status: DC | PRN
  Administered 2012-12-13: 50 ug/kg/min via INTRAVENOUS

## 2012-12-13 MED ORDER — MIDAZOLAM HCL 5 MG/5ML IJ SOLN
INTRAMUSCULAR | Status: DC | PRN
  Administered 2012-12-13 (×2): 1 mg via INTRAVENOUS

## 2012-12-13 MED ORDER — OXYCODONE HCL 5 MG PO TABS: 5.0000 mg | ORAL_TABLET | Freq: Once | ORAL | Status: AC | PRN

## 2012-12-13 MED ORDER — FENTANYL CITRATE 0.05 MG/ML IJ SOLN: 50.0000 ug | Freq: Once | INTRAMUSCULAR | Status: DC

## 2012-12-13 MED ORDER — PROMETHAZINE HCL 25 MG/ML IJ SOLN
6.2500 mg | INTRAMUSCULAR | Status: DC | PRN
  Administered 2012-12-13: 6.25 mg via INTRAVENOUS

## 2012-12-13 MED ORDER — GLYCOPYRROLATE 0.2 MG/ML IJ SOLN
INTRAMUSCULAR | Status: DC | PRN
  Administered 2012-12-13: 0.2 mg via INTRAVENOUS

## 2012-12-13 MED ORDER — LACTATED RINGERS IV SOLN
INTRAVENOUS | Status: DC | PRN
  Administered 2012-12-13: 13:00:00 via INTRAVENOUS

## 2012-12-13 MED ORDER — PROPOFOL 10 MG/ML IV BOLUS
INTRAVENOUS | Status: DC | PRN
  Administered 2012-12-13 (×2): 20 mg via INTRAVENOUS

## 2012-12-13 MED ORDER — MIDAZOLAM HCL 2 MG/2ML IJ SOLN: 1.0000 mg | INTRAMUSCULAR | Status: DC | PRN

## 2012-12-13 MED ORDER — FENTANYL CITRATE 0.05 MG/ML IJ SOLN
INTRAMUSCULAR | Status: DC | PRN
  Administered 2012-12-13: 50 ug via INTRAVENOUS
  Administered 2012-12-13 (×2): 25 ug via INTRAVENOUS

## 2012-12-13 NOTE — Anesthesia Preprocedure Evaluation (Addendum)
Anesthesia Evaluation  Patient identified by MRN, date of birth, ID band Patient awake    Reviewed: Allergy & Precautions, H&P , NPO status , Patient's Chart, lab work & pertinent test results, reviewed documented beta blocker date and time   Airway Mallampati: I TM Distance: >3 FB Neck ROM: Full    Dental  (+) Teeth Intact and Dental Advisory Given   Pulmonary neg pulmonary ROS,  breath sounds clear to auscultation        Cardiovascular hypertension, negative cardio ROS  Rhythm:Regular Rate:Normal     Neuro/Psych negative neurological ROS     GI/Hepatic Neg liver ROS, GERD-  Medicated,Mal abd neoplasm Vomiting   Endo/Other  negative endocrine ROS  Renal/GU negative Renal ROS  negative genitourinary   Musculoskeletal negative musculoskeletal ROS (+)   Abdominal (+)  Abdomen: soft. Bowel sounds: normal.  Peds negative pediatric ROS (+)  Hematology negative hematology ROS (+)   Anesthesia Other Findings   Reproductive/Obstetrics negative OB ROS                         Anesthesia Physical Anesthesia Plan  ASA: III  Anesthesia Plan: MAC   Post-op Pain Management:    Induction: Intravenous  Airway Management Planned: Nasal Cannula  Additional Equipment:   Intra-op Plan:   Post-operative Plan:   Informed Consent: I have reviewed the patients History and Physical, chart, labs and discussed the procedure including the risks, benefits and alternatives for the proposed anesthesia with the patient or authorized representative who has indicated his/her understanding and acceptance.     Plan Discussed with: CRNA and Surgeon  Anesthesia Plan Comments:         Anesthesia Quick Evaluation

## 2012-12-13 NOTE — Anesthesia Postprocedure Evaluation (Signed)
  Anesthesia Post-op Note  Patient: [name]  Procedure(s) Performed: Procedure(s) (LRB) with comments: ESOPHAGOGASTRODUODENOSCOPY (EGD) (N/A) - with attempt to stent duodenal stricture  Patient Location: PACU  Anesthesia Type:MAC  Level of Consciousness: awake and alert   Airway and Oxygen Therapy: Patient Spontanous Breathing  Post-op Pain: mild  Post-op Assessment: Post-op Vital signs reviewed, Patient's Cardiovascular Status Stable, Respiratory Function Stable, Patent Airway, No signs of Nausea or vomiting and Pain level controlled  Post-op Vital Signs: stable  Complications: No apparent anesthesia complications

## 2012-12-13 NOTE — Transfer of Care (Signed)
Immediate Anesthesia Transfer of Care Note  Patient: [name]  Procedure(s) Performed: Procedure(s) (LRB) with comments: ESOPHAGOGASTRODUODENOSCOPY (EGD) (N/A) - with attempt to stent duodenal stricture  Patient Location: PACU  Anesthesia Type:MAC  Level of Consciousness: awake, alert  and oriented  Airway & Oxygen Therapy: Patient Spontanous Breathing  Post-op Assessment: Report given to PACU RN and Post -op Vital signs reviewed and stable  Post vital signs: Reviewed and stable  Complications: No apparent anesthesia complications

## 2012-12-13 NOTE — Progress Notes (Signed)
Small bowel stent was placed.  Plan KUB.  If it demonstrates contrast dye in more distal bowel will begin clear liquids.

## 2012-12-13 NOTE — Op Note (Addendum)
[name] [address] [place], [address]   OPERATIVE PROCEDURE REPORT  PATIENT: [name], [name]  MR#: [id number] BIRTHDATE: [birth year] , 56  yrs. old GENDER: Male ENDOSCOPIST: Louis Meckel, MD REFERRED BY:  Cyndia Bent, M.D. PROCEDURE DATE: 12/13/2012 PROCEDURE:   Small bowel enteroscopy with Stent Placement ASA CLASS:   Class III INDICATIONS:1.  malignancy.   2.  small bowel obstruction. MEDICATIONS: MAC sedation, administered by CRNA TOPICAL ANESTHETIC:  DESCRIPTION OF PROCEDURE:   After the risks benefits and alternatives of the procedure were thoroughly explained, informed consent was obtained.  The     endoscope was introduced through the mouth  and advanced to the proximal jejunum jejunum , limited by Without limitations.   The instrument was slowly withdrawn as the mucosa was fully examined.    A stricture was found.  Again noted was a high grade stricture beyond the ligament of treitz from external compression.  The colonoscopy was passed into the distal end of the stricture.  A 22mm 9cm uncovered duodenal stent was placed across the stricture with the distal end just beyond the distal portion of the stricture.  Position was confirmed by injecting contrast throjgh the stent.  Retroflexed views revealed no abnormalities.    The scope was then withdrawn from the patient and the procedure terminated.  COMPLICATIONS: There were no complications. 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

## 2012-12-13 NOTE — Preoperative (Signed)
Beta Blockers   Reason not to administer Beta Blockers:Not Applicable 

## 2012-12-14 ENCOUNTER — Other Ambulatory Visit (HOSPITAL_COMMUNITY): Payer: Self-pay

## 2012-12-15 ENCOUNTER — Other Ambulatory Visit (HOSPITAL_COMMUNITY): Payer: Self-pay

## 2012-12-15 ENCOUNTER — Telehealth: Payer: Self-pay | Admitting: *Deleted

## 2012-12-15 ENCOUNTER — Encounter (HOSPITAL_COMMUNITY): Payer: Self-pay | Admitting: Gastroenterology

## 2012-12-15 DIAGNOSIS — C799 Secondary malignant neoplasm of unspecified site: Secondary | ICD-10-CM

## 2012-12-15 DIAGNOSIS — K56609 Unspecified intestinal obstruction, unspecified as to partial versus complete obstruction: Secondary | ICD-10-CM

## 2012-12-15 LAB — COMPREHENSIVE METABOLIC PANEL
AST: 15 U/L (ref 0–37)
Albumin: 2.2 g/dL — ABNORMAL LOW (ref 3.5–5.2)
Calcium: 8.6 mg/dL (ref 8.4–10.5)
Creatinine, Ser: 0.55 mg/dL (ref 0.50–1.35)

## 2012-12-15 LAB — CBC WITH DIFFERENTIAL/PLATELET
Basophils Relative: 0 % (ref 0–1)
Hemoglobin: 9.3 g/dL — ABNORMAL LOW (ref 13.0–17.0)
Lymphs Abs: 1 10*3/uL (ref 0.7–4.0)
MCHC: 32.7 g/dL (ref 30.0–36.0)
Monocytes Relative: 14 % — ABNORMAL HIGH (ref 3–12)
Neutro Abs: 4 10*3/uL (ref 1.7–7.7)
Neutrophils Relative %: 67 % (ref 43–77)
RBC: 3.21 MIL/uL — ABNORMAL LOW (ref 4.22–5.81)

## 2012-12-15 LAB — CHOLESTEROL, TOTAL: Cholesterol: 98 mg/dL (ref 0–200)

## 2012-12-15 LAB — TRIGLYCERIDES: Triglycerides: 102 mg/dL (ref <150)

## 2012-12-15 LAB — PREALBUMIN: Prealbumin: 26.7 mg/dL (ref 17.0–34.0)

## 2012-12-15 LAB — PHOSPHORUS: Phosphorus: 3.6 mg/dL (ref 2.3–4.6)

## 2012-12-15 MED ORDER — METOCLOPRAMIDE HCL 5 MG/ML IJ SOLN: 10.0000 mg | Freq: Four times a day (QID) | INTRAMUSCULAR | Status: DC

## 2012-12-15 MED FILL — Promethazine HCl Inj 25 MG/ML: INTRAMUSCULAR | Qty: 1 | Status: AC

## 2012-12-15 NOTE — Progress Notes (Signed)
Limited UGI is not diagnostic for obstruction.  I have ordered a formal UGI/sbft and started IV reglan.

## 2012-12-15 NOTE — Progress Notes (Signed)
[facility] Daily Rounding Note 12/15/2012, 9:48 AM  SUBJECTIVE:       vomits every 5 or 6 hours   OBJECTIVE:         Vital signs in last 24 hours:     afebrile, VSS   General: looks comfortable, not toxic Heart: rrr Abdomen: soft, BS active, slight distention Extremities: no edema in feet Neuro:  Oriented, not confused  Intake/Output from previous day:    Intake/Output this shift:    Lab Results:  Basename 12/15/12 0500 12/13/12 0930  WBC 6.0 6.0  HGB 9.3* 9.4*  HCT 28.4* 28.9*  PLT 198 171   BMET  Basename 12/15/12 0500 12/13/12 0930  NA 132* 135  K 4.9 3.9  CL 98 101  CO2 24 25  GLUCOSE 364* 93  BUN 25* 26*  CREATININE 0.55 0.51  CALCIUM 8.6 8.5   LFT  Basename 12/15/12 0500  PROT 5.6*  ALBUMIN 2.2*  AST 15  ALT 16  ALKPHOS 233*  BILITOT 0.4  BILIDIR --  IBILI --   Studies/Results:  Dg Abd Portable 1v 12/15/2012  *RADIOLOGY REPORT*  Clinical Data: Check contrast progression through the small bowel.  PORTABLE ABDOMEN - 1 VIEW  Comparison: Abdominal radiograph performed 12/14/2012  Findings: Contrast is again seen filling the stomach, and is now better seen filling the duodenum.  There is distension of the fourth segment of the duodenum to 7.2 cm.  This is highly suspicious for occlusion at the level of the jejunal stent.  No contrast is seen progressing through the jejunal stent.  The visualized bowel gas pattern is grossly unremarkable, though there is a relative paucity of bowel gas within the abdomen.  No definite free abdominal air is identified, though evaluation for free air is suboptimal on a single supine view.  The right-sided cholecystostomy tube is grossly unremarkable in appearance.  Left-sided drains are grossly stable, though the position of the more superior drain has shifted mildly.  No acute osseous abnormalities are identified.  IMPRESSION: Contrast again seen filling the stomach, and better seen filling the duodenum.  Distension of  the fourth segment of the duodenum to 7.2 cm.  This is highly suspicious for occlusion at the level of the jejunal stent.  No evidence of contrast progression beyond the jejunal stent.   Original Report Authenticated By: Tonia Ghent, M.D.    Dg Abd Portable 1v 12/14/2012  *RADIOLOGY REPORT*  Clinical Data: Small bowel obstruction; assess for patency of jejunal stent.  PORTABLE ABDOMEN - 1 VIEW  Comparison: Abdominal radiograph performed earlier today at 09:06 p.m.  Findings: Contrast is noted filling the stomach; previously noted contrast within the duodenum is no longer well seen and may have become more dilute, or may have refluxed back into the stomach.  No contrast is seen within the jejunal stent or more distally.  The visualized bowel gas pattern is grossly unremarkable. A right- sided cholecystostomy tube is again noted; left-sided drains are seen.  No acute osseous abnormalities are seen.  Retrocardiac airspace opacification is again noted.  IMPRESSION:  1.  Contrast noted filling the stomach; previously noted contrast within the duodenum is no longer well seen and may have become more dilute, or may have refluxed back into the stomach.  No contrast seen within the jejunal stent or more distally.  To assess for obstruction at the jejunal stent, a formal upper GI series and small bowel follow-through should be performed, when clinically appropriate.  In the meantime,  a follow-up film may be considered in 3-4 hours.  2.  Persistent retrocardiac airspace opacification.   Original Report Authenticated By: Tonia Ghent, M.D.    Dg Abd Portable 1v 12/14/2012  *RADIOLOGY REPORT*  Clinical Data: Small bowel follow-through; follow-up image at 3 hours 10 minutes.  PORTABLE ABDOMEN - 1 VIEW  Comparison: Abdominal radiograph performed earlier today at 06:24 p.m.  Findings: Ingested contrast remains mostly within the stomach, with a small amount of contrast seen extending through the duodenum to the level of the  jejunal stent.  No definite contrast is seen extending through the majority of the jejunal stent.  The visualized bowel gas pattern is grossly unremarkable, though there is a relative paucity of bowel gas within the abdomen.  A right-sided cholecystostomy tube is grossly unchanged in appearance.  No definite free intra-abdominal air is identified, though evaluation for free air is suboptimal on supine views.  No acute osseous abnormalities are identified.  Persistent retrocardiac airspace opacification is noted.  IMPRESSION:  1.  Ingested contrast remains mostly within the stomach, with a small amount of contrast seen extending through the duodenum to the level of the jejunal stent.  No definite contrast seen extending through the majority of the jejunal stent.  A follow-up image is planned in 2 hours.  2.  Persistent retrocardiac airspace opacification seen.   Original Report Authenticated By: Tonia Ghent, M.D.    Varney Biles Kayleen Memos Hans Eden 12/14/2012  *RADIOLOGY REPORT*  Clinical Data:  Nausea and vomiting after small bowel wall stent placement.  UPPER GI SERIES WITH KUB  Technique:  Routine upper GI series was performed with thin barium.  Fluoroscopy Time: Zero minutes  Comparison:  None.  Findings: Scout view of the abdomen shows and metallic wall stent in the left abdomen.  Surgical drain is also seen in the left abdomen. Postoperative changes and a pigtail drain are seen in the right abdomen.  Relative paucity of gas overall.  An extremely limited examination was performed at the request of the referring physician.  The patient drank one couple of barium. A single AP supine image shows contrast in the stomach.  IMPRESSION: Contrast remains in the stomach after ingestion of one cup of barium.  The patient will drink another half cup of barium and will be reimaged in 2 hours to assess patency of the small bowel stent.   Original Report Authenticated By: Leanna Battles, M.D.     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  After attempt #2, dr Arlyce Dice able to place stent.  However the patient remains obstructed.    PLAN: *  Per d/w rads and Dr Arlyce Dice:  Place NGT for relief of SBO, no reglan, attempt ct study tomorrow (if contrast sufficiently evacuated by then) to try to delineate the level of obstruction.  Dr Arlyce Dice will assess CT and determine if attempting another stent soul potentially offer palliative relief.  *  NG suction.   *  Surgical options at this point?.  Dr Jamey Ripa was dubious of his surgical candidacy as of 12/25 ( the latest) note.  Dr [name] at [facility] also offered no repeat.   *  Has pt had any interaction with palliative care ZO:XWRUE of care?  *  Pt and family have had many MDs tell them there may be nothing that can be done to remedy situatiion, they do not seem to have heard this message and continue to hod out hopes that something can be done.    LOS:  53 days   Jennye Moccasin  12/15/2012, 9:48 AM Pager: [phone]   I have reviewed the above note, examined the patient and agree with plan of treatment. Have reviewed limited Barium study from last night- no progression of the barium beyond the duodenal stent. Dr Arlyce Dice out of town for 1 week. He suggests to place an overlying stent. For now we will place NG tube for decompression. Continue cTNA  [facility] Pager # [phone]

## 2012-12-15 NOTE — Telephone Encounter (Signed)
Sister calling to let dr [name] know that patient is in [facility] # [phone]. Note to dr Alver Fisher desk

## 2012-12-16 ENCOUNTER — Other Ambulatory Visit (HOSPITAL_COMMUNITY): Payer: Self-pay

## 2012-12-16 DIAGNOSIS — K921 Melena: Secondary | ICD-10-CM

## 2012-12-16 DIAGNOSIS — C762 Malignant neoplasm of abdomen: Secondary | ICD-10-CM

## 2012-12-16 LAB — BASIC METABOLIC PANEL
BUN: 24 mg/dL — ABNORMAL HIGH (ref 6–23)
CO2: 25 mEq/L (ref 19–32)
Calcium: 9 mg/dL (ref 8.4–10.5)
Chloride: 99 mEq/L (ref 96–112)
Creatinine, Ser: 0.54 mg/dL (ref 0.50–1.35)
Glucose, Bld: 83 mg/dL (ref 70–99)

## 2012-12-16 LAB — PROCALCITONIN: Procalcitonin: 0.2 ng/mL

## 2012-12-16 MED ORDER — IOHEXOL 300 MG/ML  SOLN
80.0000 mL | Freq: Once | INTRAMUSCULAR | Status: AC | PRN
  Administered 2012-12-16: 80 mL via INTRAVENOUS

## 2012-12-16 MED ORDER — IOHEXOL 300 MG/ML  SOLN
20.0000 mL | INTRAMUSCULAR | Status: AC
  Administered 2012-12-16 (×2): 20 mL via ORAL

## 2012-12-16 NOTE — Progress Notes (Addendum)
     [facility] Daily Rounding Note 12/16/2012, 1:47 PM  SUBJECTIVE:       No signif abd pain.  Wondering when another intestinal stent will be placed.   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 After attempt #2, dr Arlyce Dice able to place stent. However the patient remains obstructed. NGT in place. Pt may have other reasons for obstructive sxs, including linitis plastica.  *  Signet cell peritoneal carcinomatosis.  Complicated surgeries to address obstruction at [facility]. *  Normocytic anemia.  *  Hyponatremia *  Elevated Alk Phos, normal transaminases and T Bili.  This may be response to prolonged TNA.  CT scan can help assess if he has liver mets or biliary system abnormalities.      PLAN: *  Await the CT scan ordered for today. *  Consult request to Dr [name] placed. *  CEA level reordered to reflect blood specimen rather than fluid specimen.  *  GI will not see pt tomorrow as GI MD covering tomorrow does not have priviledges at [facility].    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 Linitis plastica may explain why stomach does not empty on the barium study. I  am not sure that opening the duodenal stricture will change the course. Dr [name] in his assessment 11/2011 suggested chemotherapy if surgery completed ( pa not going back to Dr Remus Loffler). We will have oncology to see the pt again.  [facility] Pager # [phone] Addendum : i have reviewed the CT scan which shows minimal patency of the duodenal stent. Will continue NG suction, await oncology recommendations.

## 2012-12-18 DIAGNOSIS — D649 Anemia, unspecified: Secondary | ICD-10-CM

## 2012-12-18 DIAGNOSIS — C801 Malignant (primary) neoplasm, unspecified: Secondary | ICD-10-CM

## 2012-12-18 DIAGNOSIS — R1110 Vomiting, unspecified: Secondary | ICD-10-CM

## 2012-12-18 LAB — PHOSPHORUS: Phosphorus: 3.3 mg/dL (ref 2.3–4.6)

## 2012-12-18 LAB — COMPREHENSIVE METABOLIC PANEL
AST: 40 U/L — ABNORMAL HIGH (ref 0–37)
CO2: 27 mEq/L (ref 19–32)
Calcium: 9.4 mg/dL (ref 8.4–10.5)
Chloride: 99 mEq/L (ref 96–112)
Creatinine, Ser: 0.52 mg/dL (ref 0.50–1.35)
GFR calc Af Amer: >90 mL/min (ref >90)
GFR calc non Af Amer: >90 mL/min (ref >90)
Glucose, Bld: 108 mg/dL — ABNORMAL HIGH (ref 70–99)
Total Bilirubin: 0.5 mg/dL (ref 0.3–1.2)

## 2012-12-18 LAB — HEMOGLOBIN AND HEMATOCRIT, BLOOD: Hemoglobin: 11.3 g/dL — ABNORMAL LOW (ref 13.0–17.0)

## 2012-12-18 LAB — PROCALCITONIN: Procalcitonin: 0.18 ng/mL

## 2012-12-18 NOTE — Progress Notes (Signed)
General Surgery [facility]  Agree with assessment and plans per [facility].  Await outcome of family meeting.  Will follow up on studies scheduled for tomorrow.  Velora Heckler, MD, [facility] Office: [phone]

## 2012-12-18 NOTE — Progress Notes (Signed)
     [facility] Daily Rounding Note 12/18/2012, 8:44 AM  SUBJECTIVE:    Some nausea earlier today.  Resolved after NGT suction increased to high. Ng suction is continuous, not intermittent.  Asked RN to place the drain back to LIS.  Pt states that when suction fails, he tends to have nausea.  So long as suction is working, no nausea.  Not much output into ileostomy, about 100 cc per day.  Not bloody.     NGT drainage is between 1100 and 1300 cc daily in past 3 days.    OBJECTIVE:         Vital signs in last 24 hours:     98.4   pulse 91  resp 16   111/77   97%   General:  Did not reexamine. He looks tired but not acutely ill.    Lab Results: No results found for this basename: WBC:3,HGB:3,HCT:3,PLT:3 in the last 72 hours BMET  [facility] 12/16/12 0450  NA 132*  K 4.1  CL 99  CO2 25  GLUCOSE 83  BUN 24*  CREATININE 0.54  CALCIUM 9.0    Studies/Results: Ct Abdomen Pelvis W Contrast 12/16/2012  IMPRESSION: The proximal jejunal stent device is patent although only of very thin string of contrast material passes through the stent into the decompressed small bowel distal to the stent. Even with the stent in place, this segment of bowel appears to represent a region of luminal stenosis.  The duodenum proximal to the stent is distended.  Distal stomach appears to have abnormal circumferential wall thickening with some luminal compromise, but no outlet obstruction at this time.   Original Report Authenticated By: Kennith Center, M.D.     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 After attempt #2, Dr Arlyce Dice able to place stent. Some material getting through the stented area.      PLAN: *  Dr Lindie Spruce is meeting with pt and family this afternoon, this is not a surgical consult, but a meeting re: goals of care.   *  No plans for repeat attempt to place another jejunal stents.   *  Attempt to transfer pt to [facility] on hospitalist service, where Dr [name] can  consult?   *  gen surgery consult removing some of the drains(JP, cholecystostomy).  Pt does not want to go back to Dr [name] at [facility].  *  Allow ice chips and clamp NGT, see how well this is tolerated before deciding on progesssion to clear trays.    LOS: 56 days   Jennye Moccasin  12/18/2012, 8:44 AM Pager: [phone] I have reviewed the above note, examined the patient and agree with plan of treatment.I have discussed options with Dr Lindie Spruce, Dr [name] x2, and [facility] Dr Olivia Mackie [phone] who will be able to accept pt on his Service for the purpose of Oncology follow up and removal of some of his drains. Final decision whether to transfer will take place after Dr Lindie Spruce talks to the family.  [facility] Pager # [phone]

## 2012-12-18 NOTE — Progress Notes (Signed)
5 Days Post-Op  Subjective: 57 y/o male underwent endoscopic stent placement on Thurs Dec 26th which was not able to be placed.  They were able to place it on the 27th.  Later on the 27th the patient developed N/V and an NG tube was placed.  Since then the patient denies N/V.  However, on 12/31 a CT showed stent stenosis.  We were asked to see the patient to evaluate whether the drains can come out.  The cholecystostomy tube has been draining 25-30cc/day and the LUQ drain has been draining 100cc/day and the LLQ has been clamped.  The patient would like to proceed with drain study.  No other complaints.  Objective: PE: Gen:  Alert, NAD, pleasant Card:  RRR, no M/G/R heard Pulm:  CTA, no W/R/R Abd:  Soft, NT/ND, +BS, no HSM, 2 drains (LLQ clamped and LUQ with minimal billious output), iliostomy patent with water, chole tube draining greenish fluid, NG with bilious output.   NG tube at 600cc, patient notes that was emptied this AM   Lab Results:  No results found for this basename: WBC:2,HGB:2,HCT:2,PLT:2 in the last 72 hours BMET  [facility] 12/18/12 0445 12/16/12 0450  NA 136 132*  K 4.3 4.1  CL 99 99  CO2 27 25  GLUCOSE 108* 83  BUN 29* 24*  CREATININE 0.52 0.54  CALCIUM 9.4 9.0   PT/INR No results found for this basename: LABPROT:2,INR:2 in the last 72 hours CMP     Component Value Date/Time   NA 136 12/18/2012 0445   K 4.3 12/18/2012 0445   CL 99 12/18/2012 0445   CO2 27 12/18/2012 0445   GLUCOSE 108* 12/18/2012 0445   BUN 29* 12/18/2012 0445   CREATININE 0.52 12/18/2012 0445   CALCIUM 9.4 12/18/2012 0445   PROT 6.6 12/18/2012 0445   ALBUMIN 2.6* 12/18/2012 0445   AST 40* 12/18/2012 0445   ALT 33 12/18/2012 0445   ALKPHOS 256* 12/18/2012 0445   BILITOT 0.5 12/18/2012 0445   GFRNONAA >90 12/18/2012 0445   GFRAA >90 12/18/2012 0445   Lipase     Component Value Date/Time   LIPASE 35 11/07/2011 1734       Studies/Results: Ct Abdomen Pelvis W Contrast  12/16/2012  *RADIOLOGY REPORT*   Clinical Data: Small bowel obstruction  CT ABDOMEN AND PELVIS WITH CONTRAST  Technique:  Multidetector CT imaging of the abdomen and pelvis was performed following the standard protocol during bolus administration of intravenous contrast.  Contrast: 80mL OMNIPAQUE IOHEXOL 300 MG/ML  SOLN  Comparison: Abdominal x-ray is of multiple recent base.  Findings: Images which include the lower chest shows small bilateral pleural effusions with bibasilar collapse / consolidation.  NG tube tip is noted in the mid stomach.  There appears to be some wall thickening in the distal stomach, but this may be related to the underdistended state.  Duodenum is opacified.  There is a stent in the proximal jejunum.  Contrast material enters the proximal end of the stent and a very thin string of contrast can be seen to track through the stent lumen in the small bowel beyond the stent.  There is soft tissue or debris filling the stent lumen creating only is a very thin channel for flow of contrast through the stent.  Small bowel loops distal to the stent are decompressed.  There is a small amount of free fluid in the abdomen. Bowel anatomy is difficult to discern given the fairly substantial amount of adhesions and scarring in this  patient with peritoneal carcinomatosis.  There is a right lower quadrant stoma, presumably ileostomy.  The patient appears to be status post omentectomy and probably right hemicolectomy.  A small bowel anastomoses is seen in the right abdomen.  No focal abnormalities seen in the liver or spleen.  Pancreas is unremarkable.  The adrenal glands are normal.  No evidence for hydronephrosis.  Probable cyst noted in the right kidney.  No abdominal aortic aneurysm.  Pigtail catheter is seen coiled in the anterior midline in or just deep to the rectus sheath.  A second to pigtail catheter in the right upper quadrant adjacent to the gallbladder fundus is consistent with a reported history of cholecystostomy.  The  gallbladder is decompressed.  Surgical drain is positioned in the left paracolic gutter.  Imaging through the pelvis shows enlarged prostate gland.  The bladder is decompressed.  Left colon is decompressed.  Bone windows reveal no worrisome lytic or sclerotic osseous lesions.  IMPRESSION: The proximal jejunal stent device is patent although only of very thin string of contrast material passes through the stent into the decompressed small bowel distal to the stent. Even with the stent in place, this segment of bowel appears to represent a region of luminal stenosis.  The duodenum proximal to the stent is distended.  Distal stomach appears to have abnormal circumferential wall thickening with some luminal compromise, but no outlet obstruction at this time.   Original Report Authenticated By: Kennith Center, M.D.     Anti-infectives: Anti-infectives     Start     Dose/Rate Route Frequency Ordered Stop   12/12/12 1130   ceFAZolin (ANCEF) IVPB 2 g/50 mL premix        2 g 100 mL/hr over 30 Minutes Intravenous  Once 12/12/12 1120 12/12/12 1201           Assessment/Plan 57 y/o male with likely SB obstruction s/p SB stent placement now with N/V, h/o of terminal peritoneal carcinomatosis 1.  Will get contrast drain study on 3 drains to check for patency of drains and patency of cystic duct (cholecystostomy tube, 2 surgical drains-LUQ/LLQ), talked to IR who will perform the test tomorrow 2.  Dr. Arlyce Dice (GI) may re-try endoscopic stenting of obstruction sometime next week 3.  GI to clamp NG to see if patient tolerates 4.  Patient to have family meeting at 3pm to discuss his options (doing nothing and d/c home, going to SNF, or going to [facility]. 5.  At [facility] his oncologist could round on him and discuss end of life issues 6.  The family and the patient are in denial about the terminal nature his his condition although many people have expressed his advanced condition.   7.  Will follow until drain study is  performed   LOS: 56 days    DORT, Kelis Plasse 12/18/2012, 12:54 PM Pager: [phone]

## 2012-12-19 ENCOUNTER — Inpatient Hospital Stay (HOSPITAL_COMMUNITY)
Admission: AD | Admit: 2012-12-19 | Discharge: 2013-01-09 | DRG: 374 | Disposition: A | Discharge status: Hospice | Payer: 59 | Source: Ambulatory Visit | Attending: Internal Medicine | Admitting: Internal Medicine

## 2012-12-19 DIAGNOSIS — E876 Hypokalemia: Secondary | ICD-10-CM | POA: Diagnosis not present

## 2012-12-19 DIAGNOSIS — A419 Sepsis, unspecified organism: Secondary | ICD-10-CM | POA: Diagnosis not present

## 2012-12-19 DIAGNOSIS — Z794 Long term (current) use of insulin: Secondary | ICD-10-CM

## 2012-12-19 DIAGNOSIS — K5900 Constipation, unspecified: Secondary | ICD-10-CM

## 2012-12-19 DIAGNOSIS — A4101 Sepsis due to Methicillin susceptible Staphylococcus aureus: Secondary | ICD-10-CM

## 2012-12-19 DIAGNOSIS — R100 Acute abdomen: Secondary | ICD-10-CM | POA: Diagnosis present

## 2012-12-19 DIAGNOSIS — K219 Gastro-esophageal reflux disease without esophagitis: Secondary | ICD-10-CM | POA: Diagnosis present

## 2012-12-19 DIAGNOSIS — D72829 Elevated white blood cell count, unspecified: Secondary | ICD-10-CM

## 2012-12-19 DIAGNOSIS — Z515 Encounter for palliative care: Secondary | ICD-10-CM

## 2012-12-19 DIAGNOSIS — C786 Secondary malignant neoplasm of retroperitoneum and peritoneum: Principal | ICD-10-CM

## 2012-12-19 DIAGNOSIS — K56609 Unspecified intestinal obstruction, unspecified as to partial versus complete obstruction: Secondary | ICD-10-CM

## 2012-12-19 DIAGNOSIS — Z79899 Other long term (current) drug therapy: Secondary | ICD-10-CM

## 2012-12-19 DIAGNOSIS — I10 Essential (primary) hypertension: Secondary | ICD-10-CM | POA: Diagnosis present

## 2012-12-19 DIAGNOSIS — C801 Malignant (primary) neoplasm, unspecified: Secondary | ICD-10-CM | POA: Diagnosis present

## 2012-12-19 DIAGNOSIS — R6520 Severe sepsis without septic shock: Secondary | ICD-10-CM | POA: Diagnosis not present

## 2012-12-19 DIAGNOSIS — C762 Malignant neoplasm of abdomen: Secondary | ICD-10-CM

## 2012-12-19 DIAGNOSIS — C799 Secondary malignant neoplasm of unspecified site: Secondary | ICD-10-CM

## 2012-12-19 DIAGNOSIS — T80211A Bloodstream infection due to central venous catheter, initial encounter: Secondary | ICD-10-CM | POA: Diagnosis not present

## 2012-12-19 DIAGNOSIS — K5669 Other intestinal obstruction: Secondary | ICD-10-CM | POA: Diagnosis present

## 2012-12-19 DIAGNOSIS — R579 Shock, unspecified: Secondary | ICD-10-CM | POA: Diagnosis not present

## 2012-12-19 DIAGNOSIS — R1110 Vomiting, unspecified: Secondary | ICD-10-CM

## 2012-12-19 DIAGNOSIS — K831 Obstruction of bile duct: Secondary | ICD-10-CM | POA: Diagnosis present

## 2012-12-19 DIAGNOSIS — D61818 Other pancytopenia: Secondary | ICD-10-CM | POA: Diagnosis not present

## 2012-12-19 DIAGNOSIS — K812 Acute cholecystitis with chronic cholecystitis: Secondary | ICD-10-CM

## 2012-12-19 DIAGNOSIS — D649 Anemia, unspecified: Secondary | ICD-10-CM

## 2012-12-19 DIAGNOSIS — R112 Nausea with vomiting, unspecified: Secondary | ICD-10-CM

## 2012-12-19 DIAGNOSIS — D638 Anemia in other chronic diseases classified elsewhere: Secondary | ICD-10-CM

## 2012-12-19 DIAGNOSIS — Z932 Ileostomy status: Secondary | ICD-10-CM

## 2012-12-19 DIAGNOSIS — K921 Melena: Secondary | ICD-10-CM

## 2012-12-19 DIAGNOSIS — A4102 Sepsis due to Methicillin resistant Staphylococcus aureus: Secondary | ICD-10-CM | POA: Diagnosis not present

## 2012-12-19 DIAGNOSIS — R7881 Bacteremia: Secondary | ICD-10-CM

## 2012-12-19 DIAGNOSIS — K810 Acute cholecystitis: Secondary | ICD-10-CM | POA: Diagnosis present

## 2012-12-19 DIAGNOSIS — Y849 Medical procedure, unspecified as the cause of abnormal reaction of the patient, or of later complication, without mention of misadventure at the time of the procedure: Secondary | ICD-10-CM | POA: Diagnosis not present

## 2012-12-19 DIAGNOSIS — R109 Unspecified abdominal pain: Secondary | ICD-10-CM

## 2012-12-19 DIAGNOSIS — E871 Hypo-osmolality and hyponatremia: Secondary | ICD-10-CM | POA: Diagnosis not present

## 2012-12-19 DIAGNOSIS — K632 Fistula of intestine: Secondary | ICD-10-CM | POA: Diagnosis present

## 2012-12-19 HISTORY — DX: Acute cholecystitis with chronic cholecystitis: K81.2

## 2012-12-19 HISTORY — DX: Secondary malignant neoplasm of retroperitoneum and peritoneum: C78.6

## 2012-12-19 HISTORY — DX: Anemia in other chronic diseases classified elsewhere: D63.8

## 2012-12-19 HISTORY — DX: Malignant (primary) neoplasm, unspecified: C80.1

## 2012-12-19 LAB — CBC
Hemoglobin: 10.3 g/dL — ABNORMAL LOW (ref 13.0–17.0)
MCHC: 32.6 g/dL (ref 30.0–36.0)
Platelets: 195 10*3/uL (ref 150–400)
Platelets: 193 10*3/uL (ref 150–400)
RBC: 3.27 MIL/uL — ABNORMAL LOW (ref 4.22–5.81)
RBC: 3.68 MIL/uL — ABNORMAL LOW (ref 4.22–5.81)
RDW: 15.8 % — ABNORMAL HIGH (ref 11.5–15.5)
WBC: 6.3 10*3/uL (ref 4.0–10.5)

## 2012-12-19 LAB — BASIC METABOLIC PANEL
Calcium: 9.5 mg/dL (ref 8.4–10.5)
Calcium: 10.1 mg/dL (ref 8.4–10.5)
GFR calc Af Amer: >90 mL/min (ref >90)
GFR calc non Af Amer: >90 mL/min (ref >90)
GFR calc non Af Amer: >90 mL/min (ref >90)
Glucose, Bld: 110 mg/dL — ABNORMAL HIGH (ref 70–99)
Potassium: 4.5 mEq/L (ref 3.5–5.1)
Potassium: 5.4 mEq/L — ABNORMAL HIGH (ref 3.5–5.1)
Sodium: 136 mEq/L (ref 135–145)
Sodium: 131 mEq/L — ABNORMAL LOW (ref 135–145)

## 2012-12-19 MED ORDER — CLONIDINE HCL 0.2 MG/24HR TD PTWK
0.2000 mg | MEDICATED_PATCH | TRANSDERMAL | Status: DC
  Administered 2012-12-19: 0.2 mg via TRANSDERMAL
  Filled 2012-12-19: qty 1

## 2012-12-19 MED ORDER — METOCLOPRAMIDE HCL 5 MG/ML IJ SOLN
10.0000 mg | Freq: Four times a day (QID) | INTRAMUSCULAR | Status: DC
  Filled 2012-12-19 (×5): qty 2

## 2012-12-19 MED ORDER — TRACE MINERALS CR-CU-F-FE-I-MN-MO-SE-ZN IV SOLN
INTRAVENOUS | Status: AC
  Administered 2012-12-19: 20:00:00 via INTRAVENOUS
  Filled 2012-12-19: qty 2000

## 2012-12-19 MED ORDER — ENOXAPARIN SODIUM 40 MG/0.4ML ~~LOC~~ SOLN
40.0000 mg | SUBCUTANEOUS | Status: DC
  Administered 2012-12-20 – 2012-12-30 (×11): 40 mg via SUBCUTANEOUS
  Filled 2012-12-19 (×15): qty 0.4

## 2012-12-19 MED ORDER — ONDANSETRON HCL 4 MG/2ML IJ SOLN
4.0000 mg | Freq: Four times a day (QID) | INTRAMUSCULAR | Status: DC | PRN
  Administered 2013-01-03 – 2013-01-04 (×2): 4 mg via INTRAVENOUS
  Filled 2012-12-19 (×5): qty 2

## 2012-12-19 MED ORDER — PANTOPRAZOLE SODIUM 40 MG IV SOLR
40.0000 mg | Freq: Two times a day (BID) | INTRAVENOUS | Status: DC
  Administered 2012-12-19 – 2012-12-20 (×2): 40 mg via INTRAVENOUS
  Filled 2012-12-19 (×3): qty 40

## 2012-12-19 NOTE — Progress Notes (Signed)
Pt transferred to med/surg bed so that he and his family have an opportunity to reassess his status and feasibility of chemotherapy by his oncologist Dr [name] whom  I have spoken to prior to the transfer. Also we wanted a continued surgery input as to removal of some of his drains left over from his surgeries more than 8 weeks ago.He may need a contrast study to assess the JP and cholecystostomy drains. Also Dr Arlyce Dice, who has been out of town this week, was considering further placement of an overlapping duodenal stent at the proximal jejunum. Dr Elnoria Howard is on call for [place] this weekend.

## 2012-12-19 NOTE — H&P (Signed)
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 Subsequently in September 2013 he underwent exploratory laparotomy, dissection of segmented item and tumor debulking. His postop course was complicated by perforation off the distal tonsils: Requiring drainage and repair. 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 [facility] of wound care management of the brain and nutritional support. He was followed and operated at [facility] by his oncologist Dr. [name]. One month back he was on TPN along with clear liquids by mouth. He however started having increased nausea and vomiting and again made n.p.o. Repeat CT of the abdomen and pelvis showed diffusely thickened small bowel loops secondary to intermittently changes or ischemia. An NGT was placed and her for surgical consultation he was not considered a surgical candidate. 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 Dr. Arlyce Dice attempted to stent placement on 12/26 2013 to,, however this failed and finally a gastrostomy tube was attempted to be placed on process 27 which feels well his surgeon Dr. [name] was able to get a stent placed into the obstruction on process 28 however his  vomiting was persistent. A CT of the abdomen and pelvis was repeated on process 21 which showed a possible filling stent to be patent however a very thin strain of contrast material was able to pass the stent. GI was reconsulted who felt that the stenting would not be fair to the patient and family meeting was held on 1/2 with the surgeon and patient was made aware that the obstruction was due to the peritoneal  cancer. He was given the option about involving palliative care versus discussing with his oncologist Dr. Juliette Alcide about further treatment options. Patient wished to be transferred to [facility] to get a second opinion from his oncologist to see if chemotherapy with very beneficial.  Patient has an NG tube at present we about 1300-1500 mL daily output. He has 3 Jackson-Pratt drains along with nonsurgical abdominal wound and the ostomy. He has been n.p.o. and on total parenteral nutrition with an NG tube placed to low wall suction he also has a wound eakin pouch.      Review of systems General:Denies fever, chills, diaphoresis, fatigue Photophobia, eye pain, redness, hearing loss, ear pain, congestion, sore throat, rhinorrhea, sneezing, mouth sores, trouble swallowing, neck pain, neck stiffness and tinnitus.   Respiratory: Denies SOB, DOE, cough, chest tightness,  and wheezing.   Cardiovascular: Denies chest pain, palpitations and leg swelling.  Gastrointestinal:  nausea, vomiting, high NG output ,denies abdominal pain, diarrhea, constipation, blood in stool and abdominal distention.  Genitourinary: Denies dysuria, urgency, frequency, hematuria, flank pain and difficulty urinating.  Musculoskeletal: Denies myalgias, back pain, joint swelling, arthralgias and gait problem.  Skin: Denies pallor, rash and wound.  Neurological:  Denies dizziness, seizures, syncope, weakness, light-headedness, numbness and headaches.  Hematological: Denies adenopathy. Easy bruising, personal or family  bleeding history  Psychiatric/Behavioral: Denies suicidal ideation, mood changes, confusion, nervousness, sleep disturbance and agitation   Past Medical History  Diagnosis Date  . Hypertension   . GERD (gastroesophageal reflux disease)   . Anemia   . Small bowel obstruction 2013  . Abdominal malignant neoplasm 10/2012    peritoneal cancer s/p chemo/ sx   Past Surgical History  Procedure Date  . Tonsillectomy   . Esophagogastroduodenoscopy 12/11/2012    Procedure: ESOPHAGOGASTRODUODENOSCOPY (EGD);  Surgeon: Louis Meckel, MD;  Location: [facility] ENDOSCOPY;  Service: Endoscopy;  Laterality: N/A;  . Duodenal stent placement 12/11/2012    Procedure: DUODENAL STENT PLACEMENT;  Surgeon: Louis Meckel, MD;  Location: [facility] ENDOSCOPY;  Service: Endoscopy;  Laterality: N/A;  . Esophagogastroduodenoscopy 12/13/2012    Procedure: ESOPHAGOGASTRODUODENOSCOPY (EGD);  Surgeon: Louis Meckel, MD;  Location: [facility] OR;  Service: Endoscopy;  Laterality: N/A;  with attempt to stent duodenal stricture  . Peg placement 12/12/2012    Procedure: PERCUTANEOUS ENDOSCOPIC GASTROSTOMY (PEG) PLACEMENT;  Surgeon: Louis Meckel, MD;  Location: [facility] ENDOSCOPY;  Service: Endoscopy;  Laterality: N/A;   Social History:  reports that he has never smoked. He has never used smokeless tobacco. He reports that he does not drink alcohol or use illicit drugs.  No Known Allergies  No family history on file.  Prior to Admission medications   Medication Sig Start Date End Date Taking? Authorizing Provider  esomeprazole (NEXIUM) 20 MG capsule Take 20 mg by mouth daily before breakfast.    Historical Provider, MD  lisinopril (PRINIVIL,ZESTRIL) 10 MG tablet Take 10 mg by mouth daily.      Historical Provider, MD  loperamide (IMODIUM) 2 MG capsule Take 2 mg by mouth 4 (four) times daily as needed. For disgestion    Historical Provider, MD  ondansetron (ZOFRAN) 4 MG tablet Take 4 mg by mouth every 8 (eight) hours as needed. For nausea     Historical Provider, MD  potassium chloride SA (K-DUR,KLOR-CON) 20 MEQ tablet Take 40 mEq by mouth daily.     Historical Provider, MD  PRESCRIPTION MEDICATION fluororacil    Historical Provider, MD  PRESCRIPTION MEDICATION oxaliflatin    Historical Provider, MD  PRESCRIPTION MEDICATION bevacizumab    Historical Provider, MD    Physical Exam:  There were no vitals filed for this visit.  Constitutional: Vital signs reviewed.  Patient is a thin built male lying in bed in no acute distress, NG tube in place draining biliary fluid. Head: Normocephalic and atraumatic Ear: TM normal bilaterally Mouth: no erythema or exudates, MMM Eyes: PERRL, EOMI, conjunctivae normal, No scleral icterus.  Neck: Supple, Trachea midline normal ROM, No JVD, mass, thyromegaly, or carotid bruit present.  Cardiovascular: RRR, S1 normal, S2 normal, no MRG, pulses symmetric and intact bilaterally Pulmonary/Chest: CTAB, no wheezes, rales, or rhonchi Abdominal: LLQ drain clamped and LUQ with minimal billious output, iliostomy ,  cholecystectomy  tube draining greenish fluid, NG with bilious output.  Soft. Non-tender, non-distended, bowel sounds are normal, no masses, organomegaly, or guarding present.  GU: no CVA tenderness Musculoskeletal: No joint deformities, erythema, or stiffness, ROM full and no nontender Ext: no edema and no cyanosis, pulses palpable bilaterally (DP and PT) Hematology: no cervical, inginal, or axillary adenopathy.  Neurological: A&O x3, Strenght is normal and symmetric bilaterally, cranial nerve II-XII are grossly intact, no focal motor deficit, sensory intact to light touch  bilaterally.  Skin: Warm, dry and intact. No rash, cyanosis, or clubbing.  Psychiatric: Normal mood and affect. speech and behavior is normal. Judgment and thought content normal. Cognition and memory are normal.   Labs on Admission:  Basic Metabolic Panel:  Lab 12/19/12 1478 12/18/12 0445 12/16/12 0450 12/15/12 0500  12/13/12 0930  NA 136 136 132* 132* 135  K 4.5 4.3 4.1 4.9 3.9  CL 98 99 99 98 101  CO2 28 27 25 24 25   GLUCOSE 111* 108* 83 364* 93  BUN 35* 29* 24* 25* 26*  CREATININE 0.65 0.52 0.54 0.55 0.51  CALCIUM 9.5 9.4 9.0 8.6 8.5  MG -- 1.9 -- 2.4 --  PHOS -- 3.3 -- 3.6 --   Liver Function Tests:  Lab 12/18/12 0445 12/15/12 0500  AST 40* 15  ALT 33 16  ALKPHOS 256* 233*  BILITOT 0.5 0.4  PROT 6.6 5.6*  ALBUMIN 2.6* 2.2*   No results found for this basename: LIPASE:5,AMYLASE:5 in the last 168 hours No results found for this basename: AMMONIA:5 in the last 168 hours CBC:  Lab 12/19/12 0644 12/19/12 0355 12/18/12 2134 12/15/12 0500 12/13/12 0930  WBC 5.7 6.3 -- 6.0 6.0  NEUTROABS -- -- -- 4.0 --  HGB 10.3* 9.2* 11.3* 9.3* 9.4*  HCT 31.6* 30.9* 34.0* 28.4* 28.9*  MCV 85.9 94.5 -- 88.5 86.8  PLT 193 195 -- 198 171   Cardiac Enzymes: No results found for this basename: CKTOTAL:5,CKMB:5,CKMBINDEX:5,TROPONINI:5 in the last 168 hours BNP: No components found with this basename: POCBNP:5 CBG: No results found for this basename: GLUCAP:5 in the last 168 hours  Radiological Exams on Admission: No results found.   Assessment/Plan Active Problems:  Peritoneal carcinomatosis Complex medical and surgical course with multiple laparotomies for obstruction and a recent high-grade stenosis at the duodenal jejunal junction requiring stenting without too much improvement. Patient is currently n.p.o. and on TPN which I will continue. He primary wants to discuss his current condition with his oncologist and get opinion on possibility for chemotherapy or for all further treatment. I have informed his oncologist [name] that he will be admitted here.   -Continue NG and drain monitoring. Continue Reglan and Zofran for nausea . Continue PPI. -Patient is followed by [place] [facility] at [facility] and need to be consulted as needed. Patient also been followed by [facility] for a wet about his  transfer.    Anemia of chronic disease Hemoglobin currently stable.  Diet: N.p.o., on TPN DVT prophylaxis  Code Status: Full Family Communication: Discussed with wife and son at bedside Disposition Plan: Pending goals of care discussion with oncologist  Eddie North [facility] Pager [phone]  If 7PM-7AM, please contact night-coverage [email or web address] Password [email or web address]   Total time spent: 70 minutes  12/19/2012, 6:05 PM

## 2012-12-19 NOTE — Progress Notes (Signed)
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  No Known Allergies  Patient Measurements: Height: 6\' 4"  (193 cm) Weight: 204 lb (92.534 kg) IBW/kg (Calculated) : 86.8  Adjusted Body Weight:  Usual Weight:   Vital Signs: Temp: 98.1 F (36.7 C) (01/03 1646) Temp src: Oral (01/03 1646) BP: 108/80 mmHg (01/03 1646) Pulse Rate: 112  (01/03 1646) Intake/Output from previous day:    Labs:  [facility] 12/19/12 0644 12/19/12 0355 12/18/12 2134  WBC 5.7 6.3 --  HGB 10.3* 9.2* 11.3*  HCT 31.6* 30.9* 34.0*  PLT 193 195 --  APTT -- -- --  INR -- -- --     Basename 12/19/12 0644 12/18/12 0445  NA 136 136  K 4.5 4.3  CL 98 99  CO2 28 27  GLUCOSE 111* 108*  BUN 35* 29*  CREATININE 0.65 0.52  LABCREA -- --  CREAT24HRUR -- --  CALCIUM 9.5 9.4  MG -- 1.9  PHOS -- 3.3  PROT -- 6.6  ALBUMIN -- 2.6*  AST -- 40*  ALT -- 33  ALKPHOS -- 256*  BILITOT -- 0.5  BILIDIR -- --  IBILI -- --  PREALBUMIN -- --  TRIG -- --  CHOLHDL -- --  CHOL -- --   Estimated Creatinine Clearance: 126.6 ml/min (by C-G formula based on Cr of 0.65).   Medical History: Past Medical History  Diagnosis Date  . Hypertension   . GERD (gastroesophageal reflux disease)   . Anemia   . Small bowel obstruction 2013  . Abdominal malignant neoplasm 10/2012    peritoneal cancer s/p chemo/ sx    Medications:  Scheduled:    . cloNIDine  0.2 mg Transdermal Weekly  . enoxaparin (LOVENOX) injection  40 mg Subcutaneous Q24H  . metoCLOPramide (REGLAN) injection  10 mg Intravenous Q6H  . pantoprazole (PROTONIX) IV  40 mg Intravenous Q12H   Infusions:   PRN: ondansetron  Insulin Requirements in the past 24 hours:  Insulin PTA:  F/u med rec Glucose 12/19/12: 111  Current Nutrition:  TPN PTA to provide 2624 Kcals, 140g Protein, fluid (125 ml/hr over 24 hrs) NPO  Assessment: 56 YOM with complex history  including terminal peritoneal carcinomatosis s/p chemo.  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  Pt with LLQ drain, LUQ drain, iliostomy, chole drain, NG tube.  Pt transferred to [facility] on 12/20/11 from [facility] for SBO management, N/V, high NG output. Labs: electrolytes wnl  Nutritional Goals:  Pending RD evaluation  Plan:   Start Clinimix E 5/20 at 90 ml/hr from 8pm 12/19/12 until 6pm 12/20/12.  This will provide 1753 kcal and 100g Protein.  Pharmacy will titrate to meet Kcal and Protein goals with next order.    TNA to contain standard multivitamins and trace elements (MWF only due to ongoing shortage).  Add/Change SSI as needed.  TNA lab panels on Mondays & Thursdays.   Lynann Beaver PharmD, BCPS Pager [phone] 12/19/2012 7:39 PM

## 2012-12-19 NOTE — Progress Notes (Addendum)
25 [facility]omatosis.  Patient is on TPN and NPO. Patient has a duodenal stent along with an NG tube ( still has 1100-1300 cc output daily) has abdominal drain placed for past few months ( placed by Dr [name] at [place]). Has choly tube and LUQ drain. patient being followed by GI ( Dr Lafonda Mosses) and surgery. Patient had a duodenal stent placed in last week as well. A family meeting was held yesterday to discuss end of life however patient wanted to discuss further treatment options with his oncologist Dr.Shaddad here to [facility] and thereafter discuss further goals of care and end-of-life issues. Dr. Dickie La requested patient to be transferred to [facility] to have an oncology evaluation and discussion with patient and family. Patient accepted for admission to Med[facility]urg at [facility]. I have informed Dr Juliette Alcide that patient will be admitted here today. Please call him in the morning for consult.

## 2012-12-20 ENCOUNTER — Encounter (HOSPITAL_COMMUNITY): Payer: Self-pay

## 2012-12-20 DIAGNOSIS — K632 Fistula of intestine: Secondary | ICD-10-CM

## 2012-12-20 DIAGNOSIS — K812 Acute cholecystitis with chronic cholecystitis: Secondary | ICD-10-CM | POA: Diagnosis present

## 2012-12-20 DIAGNOSIS — C762 Malignant neoplasm of abdomen: Secondary | ICD-10-CM

## 2012-12-20 HISTORY — DX: Acute cholecystitis with chronic cholecystitis: K81.2

## 2012-12-20 HISTORY — DX: Fistula of intestine: K63.2

## 2012-12-20 LAB — COMPREHENSIVE METABOLIC PANEL
Albumin: 2.7 g/dL — ABNORMAL LOW (ref 3.5–5.2)
Alkaline Phosphatase: 270 U/L — ABNORMAL HIGH (ref 39–117)
BUN: 38 mg/dL — ABNORMAL HIGH (ref 6–23)
CO2: 29 mEq/L (ref 19–32)
Chloride: 94 mEq/L — ABNORMAL LOW (ref 96–112)
Creatinine, Ser: 0.6 mg/dL (ref 0.50–1.35)
GFR calc non Af Amer: >90 mL/min (ref >90)
Glucose, Bld: 155 mg/dL — ABNORMAL HIGH (ref 70–99)
Potassium: 4.5 mEq/L (ref 3.5–5.1)
Total Bilirubin: 1.2 mg/dL (ref 0.3–1.2)

## 2012-12-20 LAB — MAGNESIUM: Magnesium: 1.6 mg/dL (ref 1.5–2.5)

## 2012-12-20 LAB — CBC
HCT: 32.4 % — ABNORMAL LOW (ref 39.0–52.0)
Hemoglobin: 10.8 g/dL — ABNORMAL LOW (ref 13.0–17.0)
MCHC: 33.3 g/dL (ref 30.0–36.0)
MCV: 84.4 fL (ref 78.0–100.0)
RDW: 15.1 % (ref 11.5–15.5)

## 2012-12-20 LAB — TRIGLYCERIDES: Triglycerides: 66 mg/dL (ref <150)

## 2012-12-20 LAB — DIFFERENTIAL
Eosinophils Relative: 1 % (ref 0–5)
Lymphocytes Relative: 24 % (ref 12–46)
Monocytes Absolute: 1 10*3/uL (ref 0.1–1.0)
Monocytes Relative: 16 % — ABNORMAL HIGH (ref 3–12)
Neutro Abs: 3.5 10*3/uL (ref 1.7–7.7)

## 2012-12-20 LAB — GLUCOSE, CAPILLARY: Glucose-Capillary: 162 mg/dL — ABNORMAL HIGH (ref 70–99)

## 2012-12-20 LAB — PREALBUMIN: Prealbumin: 29.8 mg/dL (ref 17.0–34.0)

## 2012-12-20 MED ORDER — MAGIC MOUTHWASH
15.0000 mL | Freq: Four times a day (QID) | ORAL | Status: DC | PRN
  Filled 2012-12-20: qty 15

## 2012-12-20 MED ORDER — BIOTENE DRY MOUTH MT LIQD
15.0000 mL | Freq: Two times a day (BID) | OROMUCOSAL | Status: DC
  Administered 2012-12-20 – 2013-01-09 (×37): 15 mL via OROMUCOSAL

## 2012-12-20 MED ORDER — ALUM & MAG HYDROXIDE-SIMETH 200-200-20 MG/5ML PO SUSP: 30.0000 mL | Freq: Four times a day (QID) | ORAL | Status: DC | PRN

## 2012-12-20 MED ORDER — INSULIN ASPART 100 UNIT/ML ~~LOC~~ SOLN
0.0000 [IU] | Freq: Four times a day (QID) | SUBCUTANEOUS | Status: DC
  Administered 2012-12-20 – 2012-12-21 (×3): 2 [IU] via SUBCUTANEOUS
  Administered 2012-12-21: 1 [IU] via SUBCUTANEOUS
  Administered 2012-12-21: 2 [IU] via SUBCUTANEOUS
  Administered 2012-12-21: 1 [IU] via SUBCUTANEOUS
  Administered 2012-12-22: 2 [IU] via SUBCUTANEOUS
  Administered 2012-12-22 – 2012-12-23 (×4): 1 [IU] via SUBCUTANEOUS
  Administered 2012-12-23: 2 [IU] via SUBCUTANEOUS
  Administered 2012-12-24 (×2): 1 [IU] via SUBCUTANEOUS
  Administered 2012-12-24: 2 [IU] via SUBCUTANEOUS
  Administered 2012-12-25 – 2012-12-28 (×12): 1 [IU] via SUBCUTANEOUS
  Administered 2012-12-28: 2 [IU] via SUBCUTANEOUS
  Administered 2012-12-28 (×2): 1 [IU] via SUBCUTANEOUS
  Administered 2012-12-29: 2 [IU] via SUBCUTANEOUS
  Administered 2012-12-29 – 2013-01-01 (×8): 1 [IU] via SUBCUTANEOUS
  Administered 2013-01-01 (×2): 2 [IU] via SUBCUTANEOUS
  Administered 2013-01-02 (×3): 1 [IU] via SUBCUTANEOUS
  Administered 2013-01-02: 2 [IU] via SUBCUTANEOUS
  Administered 2013-01-03 (×2): 1 [IU] via SUBCUTANEOUS

## 2012-12-20 MED ORDER — DIPHENHYDRAMINE HCL 50 MG/ML IJ SOLN: 12.5000 mg | Freq: Four times a day (QID) | INTRAMUSCULAR | Status: DC | PRN

## 2012-12-20 MED ORDER — PANTOPRAZOLE SODIUM 40 MG IV SOLR
40.0000 mg | Freq: Two times a day (BID) | INTRAVENOUS | Status: DC
  Administered 2012-12-20 – 2013-01-09 (×39): 40 mg via INTRAVENOUS
  Filled 2012-12-20 (×42): qty 40

## 2012-12-20 MED ORDER — LIP MEDEX EX OINT
1.0000 "application " | TOPICAL_OINTMENT | Freq: Two times a day (BID) | CUTANEOUS | Status: DC
  Administered 2012-12-20 – 2013-01-09 (×36): 1 via TOPICAL
  Filled 2012-12-20 (×3): qty 7

## 2012-12-20 MED ORDER — GABAPENTIN 300 MG PO CAPS
300.0000 mg | ORAL_CAPSULE | Freq: Three times a day (TID) | ORAL | Status: DC
  Filled 2012-12-20 (×8): qty 1

## 2012-12-20 MED ORDER — BISACODYL 10 MG RE SUPP: 10.0000 mg | Freq: Two times a day (BID) | RECTAL | Status: DC | PRN

## 2012-12-20 MED ORDER — INSULIN REGULAR HUMAN 100 UNIT/ML IJ SOLN
INTRAVENOUS | Status: AC
  Administered 2012-12-20: 18:00:00 via INTRAVENOUS
  Filled 2012-12-20: qty 3000

## 2012-12-20 MED ORDER — METOCLOPRAMIDE HCL 5 MG/ML IJ SOLN
10.0000 mg | Freq: Four times a day (QID) | INTRAMUSCULAR | Status: DC
  Administered 2012-12-21 – 2013-01-01 (×36): 10 mg via INTRAVENOUS
  Filled 2012-12-20 (×83): qty 2

## 2012-12-20 MED ORDER — MENTHOL 3 MG MT LOZG
1.0000 | LOZENGE | OROMUCOSAL | Status: DC | PRN
  Administered 2012-12-23: 3 mg via ORAL
  Filled 2012-12-20 (×4): qty 9

## 2012-12-20 MED ORDER — PROMETHAZINE HCL 25 MG/ML IJ SOLN
25.0000 mg | Freq: Three times a day (TID) | INTRAMUSCULAR | Status: DC | PRN
  Filled 2012-12-20: qty 1

## 2012-12-20 MED ORDER — ALLOPURINOL 100 MG PO TABS
100.0000 mg | ORAL_TABLET | Freq: Every day | ORAL | Status: DC
  Filled 2012-12-20 (×3): qty 1

## 2012-12-20 MED ORDER — PHENOL 1.4 % MT LIQD
2.0000 | OROMUCOSAL | Status: DC | PRN
  Administered 2012-12-23: 2 via OROMUCOSAL
  Filled 2012-12-20 (×2): qty 177

## 2012-12-20 NOTE — Progress Notes (Signed)
Patient ID: [name], male   DOB: 10-20-1956, 57 y.o.   MRN: [id number]   Request has been made to evaluate LLQ drain and Biliary drain for possible removal  Will discuss with Radiologist in am Injection of both and poss removal Mon??  Drains were placed while pt at [facility] Not sure of dates of these  Will evaluate pt tomorrow.

## 2012-12-20 NOTE — Progress Notes (Addendum)
[name] [id number] [birth year]   Subjective:  CCS asked to continue to follow pt since transfer back to [facility] hospital Rested well - in better spirits Pain controlled Drain studies not done  Objective:  Vital signs:  Filed Vitals:   12/19/12 1646 12/19/12 2050 12/19/12 2054 12/20/12 0427  BP: 108/80 104/78 104/78 105/74  Pulse: 112  118 98  Temp: 98.1 F (36.7 C)  99.7 F (37.6 C) 98.7 F (37.1 C)  TempSrc: Oral  Oral Oral  Resp: 20  19 18   Height: 6\' 4"  (1.93 m)     Weight: 204 lb (92.534 kg)     SpO2: 100%  100% 100%    Last BM Date: 12/19/12  Intake/Output   Yesterday:  01/03 0701 - 01/04 0700 In: 949 [TPN:949] Out: 1075 [Urine:100; Emesis/NG output:900; Stool:75] This shift:     Bowel function:  Flatus: y  BM: y   Physical Exam: Male family member (?Sister) & RN in room General: Pt awake/alert/oriented x4 in acute distress.  Cacechtic Eyes: PERRL, normal EOM.  Sclera clear.  No icterus Neuro: CN II-XII intact w/o focal sensory/motor deficits. Lymph: No head/neck/groin lymphadenopathy Psych:  No delerium/psychosis/paranoia.  Smiling.  In good spirits " I have hope" HENT: Normocephalic, Mucus membranes moist.  No thrush Neck: Supple, No tracheal deviation Chest: No chest wall pain w good excursion CV:  Pulses intact.  Regular rhythm MS: Normal AROM mjr joints.  No obvious deformity Abdomen: Soft.  Nondistended.  Midline incision closed.  No incarcerated hernias.  Complex wounds/ostomy/drains:  Upper midline Eakin's pouch.  Catchihg SB fistula.  Light bilious muscus.  Robinson cath inside bag pulled out of wound.  Suction not working.  Switching to foley bag.  Pt claims low output  RLQ ostomy - no output/flatus  RUQ bilary/GB drain to Foley gravity bag - minimal bile output.  Switching to bile bag  LUQ JP drain pulled out partially - 4cm white flat area exposed - I removed & packed with NU gauze 10cm deep.  No bile/stool  LLQ JP drain w/o any  bag/bulb & loose tie around it - not draining so I flushed - flushes easily with scant return of green mucus / old blood.  Trimmed excess drain & placing bulb at end   Ext:  SCDs BLE.  No mjr edema.  No cyanosis Skin: No petechiae / purpurae  Problem List:  Active Problems:  ANEMIA  SBO (small bowel obstruction)  Peritoneal carcinomatosis  Nausea and vomiting in adult  Surgical abdomen  Anemia of chronic disease   Assessment  [name]  57 y.o. male       Peritoneal carcinomatosis s/p SB resections & intraperitoneal chemotherapy complicated by colon perf, SB fistula, cholecystitis, jejunal SB obstruction treated by perc & surgical drainage, NGT, SB stenting, and TNA   Plan: -ideally, pt should consider f/u w operating surgeon at [facility] but has Med Onc care via [place] -drain studies of  LLQ surgical drain - d/c if no fistula RUQ bile drain - consider d/c if bile system patent  -pack LUQ wound w NU Gauze -WOCN care of upper midline wound - Eakins pouch -NGT for SBO.  Difficult to try Gtube with fistulae/tubes...  I am skeptical that the jejunal fistula will ever close nor that the obstruction will open up if it has not resolved after SB stenting given carcinomatosis.   Dr. [name] was his operating surgeon at [facility].  Dr. 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  D/w Dr. Donell Beers in our group.  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  No more surgical options available.  Prognosis poor in the long term.   -consider palliative care consult -TNA  Tx per pt/family/Med Onc ([name])   -VTE prophylaxis- SCDs, etc -mobilize as tolerated to help recovery  Ardeth Sportsman, M.D., F.A.C.S. Gastrointestinal and Minimally Invasive Surgery [facility] [address], [address] [place], [place] [address] [phone] Main / Paging [phone] Voice Mail   12/20/2012  CARE TEAM:  PCP: Alan Harrier,  MD  Outpatient Care Team: Patient Care Team: [name] as PCP - General (Surgical Oncology)  Inpatient Treatment Team: Treatment Team: Attending Provider: Lorane Gell, MD; Consulting Physician: Hart Carwin, MD; Registered Nurse: Gloriajean Dell, RN; Consulting Physician: Benjiman Core, MD; Technician: Burnett Sheng, NT; Rounding Team: Merlyn Albert, MD; Registered Nurse: Massie Maroon, RN; Consulting Physician: Bishop Limbo, MD   Results:   Labs: Results for orders placed during the hospital encounter of 12/19/12 (from the past 48 hour(s))  BASIC METABOLIC PANEL     Status: Abnormal   Collection Time   12/19/12  6:45 PM      Component Value Range Comment   Sodium 131 (*) 135 - 145 mEq/L    Potassium 5.4 (*) 3.5 - 5.1 mEq/L    Chloride 94 (*) 96 - 112 mEq/L    CO2 28  19 - 32 mEq/L    Glucose, Bld 110 (*) 70 - 99 mg/dL    BUN 35 (*) 6 - 23 mg/dL    Creatinine, Ser 7.25  0.50 - 1.35 mg/dL    Calcium 36.6  8.4 - 10.5 mg/dL    GFR calc non Af Amer >90  >90 mL/min    GFR calc Af Amer >90  >90 mL/min   COMPREHENSIVE METABOLIC PANEL     Status: Abnormal   Collection Time   12/20/12  4:20 AM      Component Value Range Comment   Sodium 132 (*) 135 - 145 mEq/L    Potassium 4.5  3.5 - 5.1 mEq/L    Chloride 94 (*) 96 - 112 mEq/L    CO2 29  19 - 32 mEq/L    Glucose, Bld 155 (*) 70 - 99 mg/dL    BUN 38 (*) 6 - 23 mg/dL    Creatinine, Ser 4.40  0.50 - 1.35 mg/dL    Calcium 34.7  8.4 - 10.5 mg/dL    Total Protein 6.8  6.0 - 8.3 g/dL    Albumin 2.7 (*) 3.5 - 5.2 g/dL    AST 88 (*) 0 - 37 U/L    ALT 96 (*) 0 - 53 U/L    Alkaline Phosphatase 270 (*) 39 - 117 U/L    Total Bilirubin 1.2  0.3 - 1.2 mg/dL    GFR calc non Af Amer >90  >90 mL/min    GFR calc Af Amer >90  >90 mL/min   MAGNESIUM     Status: Normal   Collection Time   12/20/12  4:20 AM      Component Value Range Comment   Magnesium 1.6  1.5 - 2.5 mg/dL   PHOSPHORUS     Status: Abnormal   Collection Time    12/20/12  4:20 AM      Component Value Range Comment   Phosphorus 4.7 (*) 2.3 - 4.6 mg/dL   CHOLESTEROL,  TOTAL     Status: Normal   Collection Time   12/20/12  4:20 AM      Component Value Range Comment   Cholesterol 94  0 - 200 mg/dL   TRIGLYCERIDES     Status: Normal   Collection Time   12/20/12  4:20 AM      Component Value Range Comment   Triglycerides 66  <150 mg/dL   CBC     Status: Abnormal   Collection Time   12/20/12  4:20 AM      Component Value Range Comment   WBC 6.0  4.0 - 10.5 K/uL    RBC 3.84 (*) 4.22 - 5.81 MIL/uL    Hemoglobin 10.8 (*) 13.0 - 17.0 g/dL    HCT 40.9 (*) 81.1 - 52.0 %    MCV 84.4  78.0 - 100.0 fL    MCH 28.1  26.0 - 34.0 pg    MCHC 33.3  30.0 - 36.0 g/dL    RDW 91.4  78.2 - 95.6 %    Platelets 223  150 - 400 K/uL   DIFFERENTIAL     Status: Abnormal   Collection Time   12/20/12  4:20 AM      Component Value Range Comment   Neutrophils Relative 58  43 - 77 %    Neutro Abs 3.5  1.7 - 7.7 K/uL    Lymphocytes Relative 24  12 - 46 %    Lymphs Abs 1.5  0.7 - 4.0 K/uL    Monocytes Relative 16 (*) 3 - 12 %    Monocytes Absolute 1.0  0.1 - 1.0 K/uL    Eosinophils Relative 1  0 - 5 %    Eosinophils Absolute 0.1  0.0 - 0.7 K/uL    Basophils Relative 0  0 - 1 %    Basophils Absolute 0.0  0.0 - 0.1 K/uL     Imaging / Studies: No results found.  Medications / Allergies: per chart  Antibiotics: Anti-infectives    None

## 2012-12-20 NOTE — Progress Notes (Signed)
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  No Known Allergies  Patient Measurements: Height: 6\' 4"  (193 cm) Weight: 204 lb (92.534 kg) IBW/kg (Calculated) : 86.8  Adjusted Body Weight:  Usual Weight:   Vital Signs: Temp: 98.7 F (37.1 C) (01/04 0427) Temp src: Oral (01/04 0427) BP: 105/74 mmHg (01/04 0427) Pulse Rate: 98  (01/04 0427) Intake/Output from previous day: 01/03 0701 - 01/04 0700 In: 949 [TPN:949] Out: 1075 [Urine:100; Emesis/NG output:900; Stool:75]  Labs:  [facility] 12/20/12 0420 12/19/12 0644 12/19/12 0355  WBC 6.0 5.7 6.3  HGB 10.8* 10.3* 9.2*  HCT 32.4* 31.6* 30.9*  PLT 223 193 195  APTT -- -- --  INR -- -- --     Basename 12/20/12 0420 12/19/12 1845 12/19/12 0644 12/18/12 0445  NA 132* 131* 136 --  K 4.5 5.4* 4.5 --  CL 94* 94* 98 --  CO2 29 28 28  --  GLUCOSE 155* 110* 111* --  BUN 38* 35* 35* --  CREATININE 0.60 0.52 0.65 --  LABCREA -- -- -- --  CREAT24HRUR -- -- -- --  CALCIUM 10.1 10.1 9.5 --  MG 1.6 -- -- 1.9  PHOS 4.7* -- -- 3.3  PROT 6.8 -- -- 6.6  ALBUMIN 2.7* -- -- 2.6*  AST 88* -- -- 40*  ALT 96* -- -- 33  ALKPHOS 270* -- -- 256*  BILITOT 1.2 -- -- 0.5  BILIDIR -- -- -- --  IBILI -- -- -- --  PREALBUMIN -- -- -- --  TRIG -- -- -- --  CHOLHDL -- -- -- --  CHOL -- -- -- --   Estimated Creatinine Clearance: 126.6 ml/min (by C-G formula based on Cr of 0.6).   Medical History: Past Medical History  Diagnosis Date  . Hypertension   . GERD (gastroesophageal reflux disease)   . Anemia   . Small bowel obstruction 2013  . Abdominal malignant neoplasm 10/2012    peritoneal cancer s/p chemo/ sx    Medications:  Scheduled:     . antiseptic oral rinse  15 mL Mouth Rinse BID  . cloNIDine  0.2 mg Transdermal Weekly  . enoxaparin (LOVENOX) injection  40 mg Subcutaneous Q24H  . metoCLOPramide (REGLAN) injection  10 mg Intravenous Q6H    . pantoprazole (PROTONIX) IV  40 mg Intravenous Q12H   Infusions:     . TPN (CLINIMIX) +/- additives 90 mL/hr at 12/19/12 2012   PRN: ondansetron  Insulin Requirements in the past 24 hours:  Glucose 1/4 BMP = 155 Per [facility] med list, patient on SSI and regular insulin in TPN (44.6 units)  Current Nutrition:  TPN PTA to provide 2624 Kcals, 140g Protein, fluid (125 ml/hr over 24 hrs) NPO  Assessment: 56 YOM with complex history including terminal peritoneal carcinomatosis s/p chemo.  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  Pt with LLQ drain, LUQ drain, iliostomy, chole drain, NG tube.  Pt transferred to [facility] on 12/20/11 from [facility] for SBO management, N/V, high NG output. Plans to place overlapping doudenal stent this coming week.   Labs: Na=132, K better, Phos sl elevated at 4.7, Mg = 1.6, Corrected Ca sl elevated at 11.1 Renal: wnl, I/O incomplete d/t transfer LFTs: AST/ALT sl elevated CBGs: NO CBGs, serum BG = 155 IVF: NO orders GI PPx: Protonix 40mg  IV daily  Nutritional Goals:  Pending RD evaluation  Plan:   Increase Clinimix  E 5/20 to previous goal of 111ml/hr. Lipids MWF d/t backorder.  To provide  Protein 138gm and 2909 kcal MWF, 2429 Kcal TTSS for avg of 2634 kcal  Add 20 units of insulin to tonight's bag and monitor CBGs (suspect will need to increase insulin in bag)  TNA to contain standard multivitamins and trace elements (MWF only due to ongoing shortage).  Start q6h sensitive SSI d/t mild hyperglycemia on this am BMP  Monitor electrolytes d/t sl elevated Phos, Corr Ca  Monitor LFTs - could be related to TPN (off and on TPN since sept and appears has been on for at least one month)  Await trigs and prealbumin (pending from this am)  Await additional RD nutritional assessment   Juliette Alcide, PharmD, BCPS.   Pager: [phone]  12/20/2012 7:09 AM

## 2012-12-20 NOTE — Progress Notes (Signed)
[facility] PROGRESS NOTE  [name] HQI:[id number] DOB: March 18, 1956 DOA: 12/19/2012 PCP: [name], MD  Assessment/Plan: 1. Peritoneal carcinomatosis diagnosed in December 2012 s/p extensive debulking surgery at [facility] twice (1st time January 2013 second time 08/2012) . The second time the patient developed postoperative complications and required reintervention and JP drains and NPo status - currently with SBO and had SB stent placed by Dr. Arlyce Dice during admission at [facility] on 12/27 to resolve obstruction - apparently the patient needs a new procedure with a new stent to be placed in the duodenum- patient still with NG tube and TNA - await GI and surgery to indicate when patient can eat and oncology consultation to see if patient is a chemo candidate  2. Cholecystitis status post cholecystostomy drain placed at [facility] - interventional radiology to evaluate feasibility of pulling the cholecystostomy drain.   Code Status: full Family Communication: wife at bedside  Disposition Plan: ?   Consultants: Oncology  [facility]  IR  Procedures:  none  Antibiotics:  None  (indicate start date, and stop date if known)  HPI/Subjective: He would like to eat  Objective: Filed Vitals:   12/19/12 1646 12/19/12 2050 12/19/12 2054 12/20/12 0427  BP: 108/80 104/78 104/78 105/74  Pulse: 112  118 98  Temp: 98.1 F (36.7 C)  99.7 F (37.6 C) 98.7 F (37.1 C)  TempSrc: Oral  Oral Oral  Resp: 20  19 18   Height: 6\' 4"  (1.93 m)     Weight: 92.534 kg (204 lb)     SpO2: 100%  100% 100%    Intake/Output Summary (Last 24 hours) at 12/20/12 0818 Last data filed at 12/20/12 0650  Gross per 24 hour  Intake    949 ml  Output   1075 ml  Net   -126 ml   Filed Weights   12/19/12 1646  Weight: 92.534 kg (204 lb)    Exam:   General:  Alert and oriented x3  Cardiovascular: Regular rate and rhythm without murmurs rubs or gallops  Respiratory: Clear to  auscultation bilaterally  Abdomen: Multiple drains  Data Reviewed: Basic Metabolic Panel:  Lab 12/20/12 1324 12/19/12 1845 12/19/12 0644 12/18/12 0445 12/16/12 0450 12/15/12 0500  NA 132* 131* 136 136 132* --  K 4.5 5.4* 4.5 4.3 4.1 --  CL 94* 94* 98 99 99 --  CO2 29 28 28 27 25  --  GLUCOSE 155* 110* 111* 108* 83 --  BUN 38* 35* 35* 29* 24* --  CREATININE 0.60 0.52 0.65 0.52 0.54 --  CALCIUM 10.1 10.1 9.5 9.4 9.0 --  MG 1.6 -- -- 1.9 -- 2.4  PHOS 4.7* -- -- 3.3 -- 3.6   Liver Function Tests:  Lab 12/20/12 0420 12/18/12 0445 12/15/12 0500  AST 88* 40* 15  ALT 96* 33 16  ALKPHOS 270* 256* 233*  BILITOT 1.2 0.5 0.4  PROT 6.8 6.6 5.6*  ALBUMIN 2.7* 2.6* 2.2*   No results found for this basename: LIPASE:5,AMYLASE:5 in the last 168 hours No results found for this basename: AMMONIA:5 in the last 168 hours CBC:  Lab 12/20/12 0420 12/19/12 0644 12/19/12 0355 12/18/12 2134 12/15/12 0500 12/13/12 0930  WBC 6.0 5.7 6.3 -- 6.0 6.0  NEUTROABS 3.5 -- -- -- 4.0 --  HGB 10.8* 10.3* 9.2* 11.3* 9.3* --  HCT 32.4* 31.6* 30.9* 34.0* 28.4* --  MCV 84.4 85.9 94.5 -- 88.5 86.8  PLT 223 193 195 -- 198 171   Cardiac Enzymes: No results  found for this basename: CKTOTAL:5,CKMB:5,CKMBINDEX:5,TROPONINI:5 in the last 168 hours BNP (last 3 results) No results found for this basename: PROBNP:3 in the last 8760 hours CBG: No results found for this basename: GLUCAP:5 in the last 168 hours  No results found for this or any previous visit (from the past 240 hour(s)).   Studies: No results found.  Scheduled Meds:    . antiseptic oral rinse  15 mL Mouth Rinse BID  . cloNIDine  0.2 mg Transdermal Weekly  . enoxaparin (LOVENOX) injection  40 mg Subcutaneous Q24H  . insulin aspart  0-9 Units Subcutaneous Q6H  . metoCLOPramide (REGLAN) injection  10 mg Intravenous Q6H  . pantoprazole (PROTONIX) IV  40 mg Intravenous Q12H   Continuous Infusions:    . TPN (CLINIMIX) +/- additives 90 mL/hr at  12/19/12 2012    Active Problems:  ANEMIA  SBO (small bowel obstruction)  Peritoneal carcinomatosis  Nausea and vomiting in adult  Surgical abdomen  Anemia of chronic disease     Makenzey Nanni  [facility] Pager [phone]. If 8PM-8AM, please contact night-coverage at [email or web address], password [facility] 12/20/2012, 8:18 AM  LOS: 1 day

## 2012-12-21 DIAGNOSIS — C801 Malignant (primary) neoplasm, unspecified: Secondary | ICD-10-CM

## 2012-12-21 DIAGNOSIS — C50919 Malignant neoplasm of unspecified site of unspecified female breast: Secondary | ICD-10-CM

## 2012-12-21 LAB — GLUCOSE, CAPILLARY
Glucose-Capillary: 141 mg/dL — ABNORMAL HIGH (ref 70–99)
Glucose-Capillary: 149 mg/dL — ABNORMAL HIGH (ref 70–99)

## 2012-12-21 LAB — COMPREHENSIVE METABOLIC PANEL
ALT: 157 U/L — ABNORMAL HIGH (ref 0–53)
AST: 127 U/L — ABNORMAL HIGH (ref 0–37)
CO2: 28 mEq/L (ref 19–32)
Calcium: 10.2 mg/dL (ref 8.4–10.5)
Chloride: 95 mEq/L — ABNORMAL LOW (ref 96–112)
GFR calc non Af Amer: >90 mL/min (ref >90)
Sodium: 132 mEq/L — ABNORMAL LOW (ref 135–145)
Total Bilirubin: 1.9 mg/dL — ABNORMAL HIGH (ref 0.3–1.2)

## 2012-12-21 MED ORDER — MAGNESIUM SULFATE 40 MG/ML IJ SOLN
2.0000 g | Freq: Once | INTRAMUSCULAR | Status: AC
  Administered 2012-12-21: 2 g via INTRAVENOUS
  Filled 2012-12-21: qty 50

## 2012-12-21 MED ORDER — TRAMADOL HCL 50 MG PO TABS: 50.0000 mg | ORAL_TABLET | Freq: Four times a day (QID) | ORAL | Status: DC | PRN

## 2012-12-21 MED ORDER — INSULIN REGULAR HUMAN 100 UNIT/ML IJ SOLN
INTRAVENOUS | Status: AC
  Administered 2012-12-21: 17:00:00 via INTRAVENOUS
  Filled 2012-12-21: qty 3000

## 2012-12-21 MED ORDER — TRAMADOL HCL 50 MG PO TABS
50.0000 mg | ORAL_TABLET | Freq: Two times a day (BID) | ORAL | Status: DC | PRN
  Administered 2012-12-21: 50 mg via ORAL
  Filled 2012-12-21: qty 1

## 2012-12-21 NOTE — Progress Notes (Signed)
[facility] PROGRESS NOTE  Alan VELDHUIZEN AVW:[id number] DOB: 1956/12/15 DOA: 12/19/2012 PCP: [name], MD  Assessment/Plan: 1. Peritoneal carcinomatosis diagnosed in December 2012 s/p extensive debulking surgery at [facility] twice (1st time January 2013 second time 08/2012) . The second time the patient developed postoperative complications and required reintervention and JP drains and NPo status - currently with SBO and had SB stent placed by Dr. Arlyce Dice during admission at [facility] on 12/27 to resolve obstruction - apparently the patient needs a new procedure with a new stent to be placed in the duodenum- patient still with NG tube and TNA - await GI and surgery to indicate when patient can eat. Oncology consultation appreciated and not a candidate for chemotherapy at this time. 2. Small bowel obstruction: Currently conservative management-n.p.o., NG tube and TNA. GI, IR and general surgery R. consulting 3. Anemia: Stable 4. Cholecystitis status post cholecystostomy drain placed at [facility] - interventional radiology to evaluate feasibility of pulling the cholecystostomy drain.   Code Status: Full Family Communication: Discussed with patient's son at bedside.  Disposition Plan: To be determined.   Consultants:  Oncology   [place]   IR  General surgery.  Procedures:  NG tube  Antibiotics:  None    HPI/Subjective: He would like to eat. Denies complaints.  Objective: Filed Vitals:   12/20/12 1448 12/20/12 2050 12/20/12 2127 12/21/12 0505  BP: 100/74 89/51 98/69  99/72  Pulse: 103 108  99  Temp: 98.4 F (36.9 C) 100.7 F (38.2 C) 98.1 F (36.7 C) 99.1 F (37.3 C)  TempSrc: Oral Oral Oral Oral  Resp: 18 18  18   Height:      Weight:      SpO2: 100% 100%  100%    Intake/Output Summary (Last 24 hours) at 12/21/12 1657 Last data filed at 12/21/12 1156  Gross per 24 hour  Intake   2443 ml  Output   1655 ml  Net    788 ml   Filed Weights     12/19/12 1646  Weight: 92.534 kg (204 lb)    Exam:   General exam: Pleasant and comfortable.  Respiratory system: Clear to auscultation. No increased work of breathing.  Cardiovascular system: First and second heart sounds heard, regular rate and rhythm. No JVD, murmurs or pedal edema  Gastroenterology: Abdomen is nondistended, soft and nontender. RLQ ostomy draining clear liquid. RUQ biliary/gallbladder drain with minimal output.  Data Reviewed: Basic Metabolic Panel:  Lab 12/21/12 8295 12/20/12 0420 12/19/12 1845 12/19/12 0644 12/18/12 0445 12/15/12 0500  NA 132* 132* 131* 136 136 --  K 3.7 4.5 5.4* 4.5 4.3 --  CL 95* 94* 94* 98 99 --  CO2 28 29 28 28 27  --  GLUCOSE 160* 155* 110* 111* 108* --  BUN 37* 38* 35* 35* 29* --  CREATININE 0.64 0.60 0.52 0.65 0.52 --  CALCIUM 10.2 10.1 10.1 9.5 9.4 --  MG 1.5 1.6 -- -- 1.9 2.4  PHOS 4.2 4.7* -- -- 3.3 3.6   Liver Function Tests:  Lab 12/21/12 0640 12/20/12 0420 12/18/12 0445 12/15/12 0500  AST 127* 88* 40* 15  ALT 157* 96* 33 16  ALKPHOS 269* 270* 256* 233*  BILITOT 1.9* 1.2 0.5 0.4  PROT 6.5 6.8 6.6 5.6*  ALBUMIN 2.6* 2.7* 2.6* 2.2*   No results found for this basename: LIPASE:5,AMYLASE:5 in the last 168 hours No results found for this basename: AMMONIA:5 in the last 168 hours CBC:  Lab 12/20/12 0420 12/19/12 0644 12/19/12  0355 12/18/12 2134 12/15/12 0500  WBC 6.0 5.7 6.3 -- 6.0  NEUTROABS 3.5 -- -- -- 4.0  HGB 10.8* 10.3* 9.2* 11.3* 9.3*  HCT 32.4* 31.6* 30.9* 34.0* 28.4*  MCV 84.4 85.9 94.5 -- 88.5  PLT 223 193 195 -- 198   Cardiac Enzymes: No results found for this basename: CKTOTAL:5,CKMB:5,CKMBINDEX:5,TROPONINI:5 in the last 168 hours BNP (last 3 results) No results found for this basename: PROBNP:3 in the last 8760 hours CBG:  Lab 12/21/12 1154 12/21/12 0558 12/20/12 2348 12/20/12 1831 12/20/12 1313  GLUCAP 141* 170* 173* 162* 154*    No results found for this or any previous visit (from the past 240  hour(s)).   Studies: No results found.  Scheduled Meds:    . allopurinol  100 mg Oral Daily  . antiseptic oral rinse  15 mL Mouth Rinse BID  . cloNIDine  0.2 mg Transdermal Weekly  . enoxaparin (LOVENOX) injection  40 mg Subcutaneous Q24H  . gabapentin  300 mg Oral TID  . insulin aspart  0-9 Units Subcutaneous Q6H  . lip balm  1 application Topical BID  . metoCLOPramide  10 mg Intravenous QID  . pantoprazole  40 mg Intravenous Q12H   Continuous Infusions:    . TPN (CLINIMIX) +/- additives 115 mL/hr at 12/20/12 1737  . TPN (CLINIMIX) +/- additives      Principal Problem:  *Peritoneal carcinomatosis Active Problems:  ANEMIA  SBO (small bowel obstruction)  Nausea and vomiting in adult  Anemia of chronic disease  Colo-enteric fistula  Acute cholecystitis s/p perc draianage [id number]  Fistula of small intestine     [facility]  [facility] Pager [phone]. If 8PM-8AM, please contact night-coverage at [email or web address], password [facility] 12/21/2012, 4:57 PM  LOS: 2 days

## 2012-12-21 NOTE — Consult Note (Signed)
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 His PET-CT  scan, as mentioned, on 11/22/2011 showed abdominal peritoneal nodularity and thickening that is not FDG avid with a biopsy that  confirmed the presence of adenocarcinoma.  He was referred to [facility]  where he under went extensive debulking surgery twice (1st time January 2013 second time 08/2012) . The second time the patient developed postoperative complications and required reintervention and JP drains and NPo status - currently with SBO.   He was transferred to [facility] in 10/2012.   He also had duodenal stent placed on12/27 buy Dr. Arlyce Dice.   CT on 12/31 showed stent stenosis. Patient currently  with LLQ drain, LUQ drain, iliostomy, chole drain, NG tube.   Patient  transferred to [facility] on 12/20/11 from [facility] for SBO management and Oncology opinion.   He still has NG tube suction. Mostly bed ridden and very weak. Not in any pain noted at this time.      Past Medical History  Diagnosis Date  . Hypertension   . GERD (gastroesophageal reflux disease)   . Anemia   . Small bowel obstruction 2013  . 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 Acute cholecystitis s/p perc draianage [id number] 12/20/2012  :  Past Surgical History  Procedure Date  . Tonsillectomy   . Esophagogastroduodenoscopy 12/11/2012    Procedure: ESOPHAGOGASTRODUODENOSCOPY (EGD);  Surgeon: Louis Meckel, MD;  Location: [facility] ENDOSCOPY;  Service: Endoscopy;  Laterality: N/A;  . Duodenal stent placement 12/11/2012    Procedure: DUODENAL STENT PLACEMENT;  Surgeon: Louis Meckel, MD;  Location: [facility] ENDOSCOPY;  Service: Endoscopy;  Laterality: N/A;  .  Esophagogastroduodenoscopy 12/13/2012    Procedure: ESOPHAGOGASTRODUODENOSCOPY (EGD);  Surgeon: Louis Meckel, MD;  Location: [facility] OR;  Service: Endoscopy;  Laterality: N/A;  with attempt to stent duodenal stricture  . Peg placement 12/12/2012    Procedure: PERCUTANEOUS ENDOSCOPIC GASTROSTOMY (PEG) PLACEMENT;  Surgeon: Louis Meckel, MD;  Location: [facility] ENDOSCOPY;  Service: Endoscopy;  Laterality: N/A;  . Small intestine surgery jan 2013    [facility].  SB resection, ileostomy, gastrostomy  . Debulking sep 2013    [facility].  ileal SB resection, debulking of peritoneal carcinomatosis, intraperitoneal chemotherapy  . Colon surgery oct 2013    [facility] transverse colon perforation  :  Current facility-administered medications:allopurinol (ZYLOPRIM) tablet 100 mg, 100 mg, Oral, Daily, Sorin C Laza, MD;  alum & mag hydroxide-simeth (MAALOX/MYLANTA) 200-200-20 MG/5ML suspension 30 mL, 30 mL, Oral, Q6H PRN, Ardeth Sportsman, MD;  antiseptic oral rinse (BIOTENE) solution 15 mL, 15 mL, Mouth Rinse, BID, Nishant Dhungel, MD, 15 mL at 12/21/12 0800;  bisacodyl (DULCOLAX) suppository 10 mg, 10 mg, Rectal, Q12H PRN, Ardeth Sportsman, MD cloNIDine (CATAPRES - Dosed in mg/24 hr) patch 0.2 mg, 0.2 mg, Transdermal, Weekly, Nishant Dhungel, MD, 0.2 mg at 12/19/12 2050;  diphenhydrAMINE (BENADRYL) injection 12.5-25 mg, 12.5-25 mg, Intravenous, Q6H PRN, Ardeth Sportsman, MD;  enoxaparin (LOVENOX) injection 40 mg, 40 mg, Subcutaneous, Q24H, Nishant Dhungel, MD, 40 mg at 12/20/12 2136;  gabapentin (NEURONTIN) capsule 300 mg, 300 mg, Oral, TID, Sorin C Laza, MD insulin aspart (novoLOG) injection 0-9 Units, 0-9 Units, Subcutaneous, Q6H, Dannielle Huh, PHARMD, 2 Units at 12/21/12 [phone];  lip  balm (CARMEX) ointment 1 application, 1 application, Topical, BID, Ardeth Sportsman, MD, 1 application at 12/20/12 2148;  magic mouthwash, 15 mL, Oral, QID PRN, Ardeth Sportsman, MD;  magnesium sulfate IVPB 2 g 50 mL, 2 g, Intravenous, Once,  Dannielle Huh, PHARMD menthol-cetylpyridinium (CEPACOL) lozenge 3 mg, 1 lozenge, Oral, PRN, Ardeth Sportsman, MD;  metoCLOPramide (REGLAN) injection 10 mg, 10 mg, Intravenous, QID, Sorin C Laza, MD;  ondansetron (ZOFRAN) injection 4 mg, 4 mg, Intravenous, Q6H PRN, Nishant Dhungel, MD;  pantoprazole (PROTONIX) injection 40 mg, 40 mg, Intravenous, Q12H, Sorin C Laza, MD, 40 mg at 12/20/12 2136 phenol (CHLORASEPTIC) mouth spray 2 spray, 2 spray, Mouth/Throat, PRN, Ardeth Sportsman, MD;  promethazine (PHENERGAN) injection 25 mg, 25 mg, Intravenous, Q8H PRN, Sorin Luanne Bras, MD;  TPN [facility]) +/- additives, , Intravenous, Continuous TPN, Dannielle Huh, PHARMD, Last Rate: 115 mL/hr at 12/20/12 1737;  TPN (CLINIMIX) +/- additives, , Intravenous, Continuous TPN, Dannielle Huh, PHARMD traMADol Janean Sark) tablet 50 mg, 50 mg, Oral, Q12H PRN, Rolan Lipa, NP, 50 mg at 12/21/12 0357:     . allopurinol  100 mg Oral Daily  . antiseptic oral rinse  15 mL Mouth Rinse BID  . cloNIDine  0.2 mg Transdermal Weekly  . enoxaparin (LOVENOX) injection  40 mg Subcutaneous Q24H  . gabapentin  300 mg Oral TID  . insulin aspart  0-9 Units Subcutaneous Q6H  . lip balm  1 application Topical BID  . magnesium sulfate 1 - 4 g bolus IVPB  2 g Intravenous Once  . metoCLOPramide  10 mg Intravenous QID  . pantoprazole  40 mg Intravenous Q12H  :  Allergies  Allergen Reactions  . Percocet (Oxycodone-Acetaminophen)     hallucination  :  History reviewed. No pertinent family history.:  History   Social History  . Marital Status: Married    Spouse Name: N/A    Number of Children: N/A  . Years of Education: N/A   Occupational History  . Not on file.   Social History Main Topics  . Smoking status: Never Smoker   . Smokeless tobacco: Never Used  . Alcohol Use: No     Comment: Stopped in July  . Drug Use: No  . Sexually Active: Yes   Other Topics Concern  . Not on file   Social  History Narrative  . No narrative on file  :  Pertinent items are noted in HPI.  Exam: Patient Vitals for the past 24 hrs:  BP Temp Temp src Pulse Resp SpO2  12/21/12 0505 99/72 mmHg 99.1 F (37.3 C) Oral 99  18  100 %  12/20/12 2127 98/69 mmHg 98.1 F (36.7 C) Oral - - -  12/20/12 2050 89/51 mmHg 100.7 F (38.2 C) Oral 108  18  100 %  12/20/12 1448 100/74 mmHg 98.4 F (36.9 C) Oral 103  18  100 %   General appearance: alert Head: Normocephalic, without obvious abnormality, atraumatic Eyes: conjunctivae/corneas clear. PERRL, EOM's intact. Fundi benign. Neck: no adenopathy, no carotid bruit, no JVD, supple, symmetrical, trachea midline and thyroid not enlarged, symmetric, no tenderness/mass/nodules Resp: clear to auscultation bilaterally Chest wall: no tenderness Cardio: regular rate and rhythm, S1, S2 normal, no murmur, click, rub or gallop GI: abnormal findings:  absent bowel sounds Extremities: extremities normal, atraumatic, no cyanosis or edema Skin: Skin color, texture, turgor normal. No rashes or lesions   Basename 12/20/12 0420 12/19/12 0644  WBC 6.0 5.7  HGB 10.8*  10.3*  HCT 32.4* 31.6*  PLT 223 193    Basename 12/21/12 0640 12/20/12 0420  NA 132* 132*  K 3.7 4.5  CL 95* 94*  CO2 28 29  GLUCOSE 160* 155*  BUN 37* 38*  CREATININE 0.64 0.60  CALCIUM 10.2 10.1   Lab Results  Component Value Date   WBC 6.0 12/20/2012   HGB 10.8* 12/20/2012   HCT 32.4* 12/20/2012   MCV 84.4 12/20/2012   PLT 223 12/20/2012    Lab 12/21/12 0640  NA 132*  K 3.7  CL 95*  CO2 28  BUN 37*  CREATININE 0.64  CALCIUM 10.2  PROT 6.5  BILITOT 1.9*  ALKPHOS 269*  ALT 157*  AST 127*  GLUCOSE 160*    Ct Abdomen Pelvis W Contrast  12/16/2012  *RADIOLOGY REPORT*  Clinical Data: Small bowel obstruction  CT ABDOMEN AND PELVIS WITH CONTRAST  Technique:  Multidetector CT imaging of the abdomen and pelvis was performed following the standard protocol during bolus administration of  intravenous contrast.  Contrast: 80mL OMNIPAQUE IOHEXOL 300 MG/ML  SOLN  Comparison: Abdominal x-ray is of multiple recent base.  Findings: Images which include the lower chest shows small bilateral pleural effusions with bibasilar collapse / consolidation.  NG tube tip is noted in the mid stomach.  There appears to be some wall thickening in the distal stomach, but this may be related to the underdistended state.  Duodenum is opacified.  There is a stent in the proximal jejunum.  Contrast material enters the proximal end of the stent and a very thin string of contrast can be seen to track through the stent lumen in the small bowel beyond the stent.  There is soft tissue or debris filling the stent lumen creating only is a very thin channel for flow of contrast through the stent.  Small bowel loops distal to the stent are decompressed.  There is a small amount of free fluid in the abdomen. Bowel anatomy is difficult to discern given the fairly substantial amount of adhesions and scarring in this patient with peritoneal carcinomatosis.  There is a right lower quadrant stoma, presumably ileostomy.  The patient appears to be status post omentectomy and probably right hemicolectomy.  A small bowel anastomoses is seen in the right abdomen.  No focal abnormalities seen in the liver or spleen.  Pancreas is unremarkable.  The adrenal glands are normal.  No evidence for hydronephrosis.  Probable cyst noted in the right kidney.  No abdominal aortic aneurysm.  Pigtail catheter is seen coiled in the anterior midline in or just deep to the rectus sheath.  A second to pigtail catheter in the right upper quadrant adjacent to the gallbladder fundus is consistent with a reported history of cholecystostomy.  The gallbladder is decompressed.  Surgical drain is positioned in the left paracolic gutter.  Imaging through the pelvis shows enlarged prostate gland.  The bladder is decompressed.  Left colon is decompressed.  Bone windows  reveal no worrisome lytic or sclerotic osseous lesions.  IMPRESSION: The proximal jejunal stent device is patent although only of very thin string of contrast material passes through the stent into the decompressed small bowel distal to the stent. Even with the stent in place, this segment of bowel appears to represent a region of luminal stenosis.  The duodenum proximal to the stent is distended.  Distal stomach appears to have abnormal circumferential wall thickening with some luminal compromise, but no outlet obstruction at this time.   Original  Report Authenticated By: Kennith Center, M.D.           Dg Abd Portable 1v  12/14/2012  *RADIOLOGY REPORT*  Clinical Data: Small bowel follow-through; follow-up image at 3 hours 10 minutes.  PORTABLE ABDOMEN - 1 VIEW  Comparison: Abdominal radiograph performed earlier today at 06:24 p.m.  Findings: Ingested contrast remains mostly within the stomach, with a small amount of contrast seen extending through the duodenum to the level of the jejunal stent.  No definite contrast is seen extending through the majority of the jejunal stent.  The visualized bowel gas pattern is grossly unremarkable, though there is a relative paucity of bowel gas within the abdomen.  A right-sided cholecystostomy tube is grossly unchanged in appearance.  No definite free intra-abdominal air is identified, though evaluation for free air is suboptimal on supine views.  No acute osseous abnormalities are identified.  Persistent retrocardiac airspace opacification is noted.  IMPRESSION:  1.  Ingested contrast remains mostly within the stomach, with a small amount of contrast seen extending through the duodenum to the level of the jejunal stent.  No definite contrast seen extending through the majority of the jejunal stent.  A follow-up image is planned in 2 hours.  2.  Persistent retrocardiac airspace opacification seen.   Original Report Authenticated By: Tonia Ghent, M.D.     Dg Kayleen Memos  W/small Bowel  12/09/2012  *RADIOLOGY REPORT*  Clinical Data:Persistent vomiting.  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 Thin barium also used.  Subsequently, serial images of the small bowel were obtained including spot views of the terminal ileum.  Fluoroscopy Time: 4.2-minute  Comparison: CT abdomen 11/27/2012  Findings: Thin barium was injected through the indwelling NG tube into  the stomach.  The stomach is mildly dilated.  The duodenum is markedly dilated.  There is a transition point at the ligament of Treitz with a stricture present at the ligament of Treitz.  There is very little contrast in the proximal jejunum.  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  Delayed images were obtained for 1 hour 15 minutes showing only a small amount of barium in the proximal jejunum.  IMPRESSION: High-grade obstruction at the ligament of Treitz with dilatation of the duodenum.  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     Assessment and Plan:   This is a pleasant 57 year old gentleman with the  following issues:  1. 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  He is S/P extensive debulking surgery twice (1st time January 2013 second time 08/2012) . The second time the patient developed postoperative complications and required reintervention and JP drains.  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  He is getting TNA for nutrient.  I disused these findings today with Mr. Dyar. At this time, he is not a candidate for chemotherapy.  His poor performance status, chronic obstruction as well his fistula renders the therapeutic margin for chemotherapy very slim.  Chemotherapy will  likely cause more complications (infections, delayed healing and poor quality of life) than actual improvement in his clinical status. I do no think it will help resolve his obstruction either.   However, I did explain that if his obstruction resolves and he is  able to take po and his performance status improves (to a point he is out of the hospital), we might reconsider that view.   For now, I would recommend the supportive measures you are doing. If he improves, then we will reconsider options.  If he does not, then hospice care will be the way to go.    2. SBO (small bowel obstruction): on TNA and NJ tube. Management per GI, surgery and primary team.

## 2012-12-21 NOTE — Progress Notes (Addendum)
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 Percocet (Oxycodone-Acetaminophen)     hallucination    Patient Measurements: Height: 6\' 4"  (193 cm) Weight: 204 lb (92.534 kg) IBW/kg (Calculated) : 86.8  Adjusted Body Weight:  Usual Weight:   Vital Signs: Temp: 99.1 F (37.3 C) (01/05 0505) Temp src: Oral (01/05 0505) BP: 99/72 mmHg (01/05 0505) Pulse Rate: 99  (01/05 0505) Intake/Output from previous day: 01/04 0701 - 01/05 0700 In: 2443 [YNW:[id number]] Out: 1510 [Urine:300; Emesis/NG output:1100; Drains:15; Stool:95]  Labs:  [facility] 12/20/12 0420 12/19/12 0644 12/19/12 0355  WBC 6.0 5.7 6.3  HGB 10.8* 10.3* 9.2*  HCT 32.4* 31.6* 30.9*  PLT 223 193 195  APTT -- -- --  INR -- -- --     Basename 12/21/12 0640 12/20/12 0420 12/19/12 1845  NA 132* 132* 131*  K 3.7 4.5 5.4*  CL 95* 94* 94*  CO2 28 29 28   GLUCOSE 160* 155* 110*  BUN 37* 38* 35*  CREATININE 0.64 0.60 0.52  LABCREA -- -- --  CREAT24HRUR -- -- --  CALCIUM 10.2 10.1 10.1  MG 1.5 1.6 --  PHOS 4.2 4.7* --  PROT 6.5 6.8 --  ALBUMIN 2.6* 2.7* --  AST 127* 88* --  ALT 157* 96* --  ALKPHOS 269* 270* --  BILITOT 1.9* 1.2 --  BILIDIR -- -- --  IBILI -- -- --  PREALBUMIN -- 29.8 --  TRIG -- 66 --  CHOLHDL -- -- --  CHOL -- 94 --   Estimated Creatinine Clearance: 126.6 ml/min (by C-G formula based on Cr of 0.64).   Medical History: Past Medical History  Diagnosis Date  . Hypertension   . GERD (gastroesophageal reflux disease)   . Anemia   . Small bowel obstruction 2013  . 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 Acute cholecystitis s/p perc draianage [id number] 12/20/2012    Medications:  Scheduled:     . allopurinol  100 mg Oral Daily  . antiseptic oral rinse  15 mL Mouth Rinse BID  . cloNIDine  0.2 mg  Transdermal Weekly  . enoxaparin (LOVENOX) injection  40 mg Subcutaneous Q24H  . gabapentin  300 mg Oral TID  . insulin aspart  0-9 Units Subcutaneous Q6H  . lip balm  1 application Topical BID  . metoCLOPramide  10 mg Intravenous QID  . pantoprazole  40 mg Intravenous Q12H  . [DISCONTINUED] metoCLOPramide (REGLAN) injection  10 mg Intravenous Q6H  . [DISCONTINUED] pantoprazole (PROTONIX) IV  40 mg Intravenous Q12H   Infusions:     . [EXPIRED] TPN (CLINIMIX) +/- additives 90 mL/hr at 12/19/12 2012  . TPN (CLINIMIX) +/- additives 115 mL/hr at 12/20/12 1737   PRN: alum & mag hydroxide-simeth, bisacodyl, diphenhydrAMINE, magic mouthwash, menthol-cetylpyridinium, ondansetron, phenol, promethazine, traMADol, [DISCONTINUED] traMADol  Insulin Requirements in the past 24 hours:  6 units Novolog since 18:00 20 units regular insulin in TPN bag Per [facility] med list, patient on SSI and regular insulin in TPN (44.6 units)  Current Nutrition:  Clinimix E5/20 at 145ml/hr to provide Protein 138gm and 2909 kcal MWF, 2429 Kcal TTSS for avg of 2634 kcal TPN PTA to provide 2624 Kcals, 140g Protein, fluid (125 ml/hr over 24 hrs) NPO  Assessment: 56 YOM with complex history including terminal peritoneal carcinomatosis s/p chemo.  Duodenal stent placed on12/27  but CT on 12/31 showed stent stenosis.  Pt with LLQ drain, LUQ drain, iliostomy, chole drain, NG tube.  Drain studies to be done to eval if drains to be removed. Pt transferred to [facility] on 12/20/11 from [facility] for SBO management, N/V, high NG output. Plans to place overlapping doudenal stent this coming week.   Labs: Na=132 (stable), Phos better 4.2, Mg = 1.5, Corrected Ca sl elevated at 11.3 (ca x phos product = 47.5) Renal: wnl, I/O = 2443/1510, drains = 15ml, NGT=1186ml LFTs: AST/ALT trending up, Tbili = 1.9.  CBGs:  CBGs a bit above goal of 150 mg/dl IVF: NO orders GI PPx: Protonix 40mg  IV daily Trigs: 66  on1/4 Prealbumin: 29.8 on 1/4  Nutritional Goals:  Pending RD evaluation Continuing approx same formula as LTACH  Plan:   Continue Clinimix E 5/20 at 190ml/hr. Lipids MWF d/t backorder.    Increase insulin 25 units/bag and monitor CBGs (suspect will need to increase insulin in bag)  TNA to contain standard multivitamins and trace elements (MWF only due to ongoing shortage).  Start q6h sensitive SSI d/t mild hyperglycemia on this am BMP  Monitor electrolytes d/t sl elevated Phos, Corr Ca  LFTs/Tibli increasing - ?? related to TPN but surprised not present at admission as on long-term TPN. (off and on TPN since sept and appears has been on for at least one month).  Continue to monitor and adjust TPN as needed  Replace magnesium 2gm IVPB x1  Await additional RD nutritional assessment to determine if changes needed  Juliette Alcide, PharmD, BCPS.   Pager: [phone]  12/21/2012 7:42 AM

## 2012-12-21 NOTE — Progress Notes (Signed)
INITIAL NUTRITION ASSESSMENT  DOCUMENTATION CODES Per approved criteria  -Not Applicable   INTERVENTION: Monitor tpn with pharmacy.  NUTRITION DIAGNOSIS: Inadequate oral intake related to altered GI function as evidenced by npo status.   Goal: tpn to meet 100% estimated needs  Monitor:  Tpn, labs, weight, plan of care  Reason for Assessment:  Consult, tpn, nutrition risk  57 y.o. 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 Subsequently in September 2013 he underwent exploratory laparotomy, dissection of segmented item and tumor debulking. His postop course was complicated by perforation off the distal tonsils: Requiring drainage and repair. 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 [facility] continuation of wound care management of the brain and nutritional support. He was followed and operated at [facility] by his oncologist Dr. [name]. One month back he was on TPN along with clear liquids by mouth. He however started having increased nausea and vomiting and again made n.p.o. Repeat CT of the abdomen and pelvis showed diffusely thickened small bowel loops secondary to intermittently changes or ischemia. An NGT was placed and her for surgical consultation he was not considered a surgical candidate. 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 Dr. Arlyce Dice attempted to stent placement on 12/26 2013 to,, however this failed and finally a  gastrostomy tube was attempted to be placed on process 27 which feels well his surgeon Dr. [name] was able to get a stent placed into the obstruction on process 28 however his vomiting was persistent. A CT of the abdomen and pelvis was repeated on process 21 which showed a possible filling stent to be patent however a very thin strain of contrast material was able to pass the stent. GI was reconsulted who felt that the stenting would not be fair to the patient and family meeting was held on 1/2 with the surgeon and patient was made aware that the obstruction was due to the peritoneal cancer. He was given the option about involving palliative care versus discussing with his oncologist Dr. Juliette Alcide about further treatment options. Patient wished to be transferred to [facility] to get a second opinion from his oncologist to see if chemotherapy with very beneficial.  ASSESSMENT: Pt with SBO tx from [facility].  NG tube in place.  Patient reports tpn since September with no po except clear liquids.  TPN:  Clinimix E 5/20 at 115 ml/hr plus 20% lipids at 10 ml/hr MWF to provide an average of 2634 kcal, 138 gm protein daily.  Height: Ht Readings from Last 1 Encounters:  12/19/12 6\' 4"  (1.93 m)    Weight: Wt Readings from Last 1 Encounters:  12/19/12 204 lb (92.534 kg)    Ideal Body Weight: 92 kg % Ideal Body Weight: 100  Wt Readings from Last 10 Encounters:  12/19/12 204 lb (92.534 kg)  12/11/12 204 lb (92.534 kg)  12/11/12 204 lb (92.534 kg)  12/11/12 204 lb (92.534 kg)  12/11/12 204 lb (92.534 kg)  12/11/12 204 lb (92.534 kg)  12/06/11 246 lb 4.8 oz (111.721 kg)  11/30/11 248 lb (112.492 kg)  11/07/11 258 lb 6.1 oz (117.2 kg)  11/30/08 283 lb (128.368 kg)    Usual Body Weight: 248# 1 year ago % Usual Body Weight: 82  BMI:  Body mass index is 24.83 kg/(m^2).  Estimated Nutritional Needs: Kcal: 2600-2800 Protein: 130-140 gm Fluid: >2.6L/day  Skin: wound eakin  pouch  Diet Order: NPO except for ice chips  EDUCATION NEEDS: -No education needs identified at this time   Intake/Output Summary (Last 24 hours) at 12/21/12 1320 Last data filed at 12/21/12 1156  Gross per 24 hour  Intake   2443 ml  Output   1655 ml  Net    788 ml    Last BM: none   Labs:   Lab 12/21/12 0640 12/20/12 0420 12/19/12 1845 12/18/12 0445  NA 132* 132* 131* --  K 3.7 4.5 5.4* --  CL 95* 94* 94* --  CO2 28 29 28  --  BUN 37* 38* 35* --  CREATININE 0.64 0.60 0.52 --  CALCIUM 10.2 10.1 10.1 --  MG 1.5 1.6 -- 1.9  PHOS 4.2 4.7* -- 3.3  GLUCOSE 160* 155* 110* --    CBG (last 3)   Basename 12/21/12 1154 12/21/12 0558 12/20/12 2348  GLUCAP 141* 170* 173*    Scheduled Meds:   . allopurinol  100 mg Oral Daily  . antiseptic oral rinse  15 mL Mouth Rinse BID  . cloNIDine  0.2 mg Transdermal Weekly  . enoxaparin (LOVENOX) injection  40 mg Subcutaneous Q24H  . gabapentin  300 mg Oral TID  . insulin aspart  0-9 Units Subcutaneous Q6H  . lip balm  1 application Topical BID  . metoCLOPramide  10 mg Intravenous QID  . pantoprazole  40 mg Intravenous Q12H    Continuous Infusions:   . TPN (CLINIMIX) +/- additives 115 mL/hr at 12/20/12 1737  . TPN [facility]) +/- additives      Past Medical History  Diagnosis Date  . Hypertension   . GERD (gastroesophageal reflux disease)   . Anemia   . Small bowel obstruction 2013  . 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 Acute cholecystitis s/p perc draianage [id number] 12/20/2012    Past Surgical History  Procedure Date  . Tonsillectomy   . Esophagogastroduodenoscopy 12/11/2012    Procedure: ESOPHAGOGASTRODUODENOSCOPY (EGD);  Surgeon: Louis Meckel, MD;  Location: [facility] ENDOSCOPY;  Service: Endoscopy;  Laterality: N/A;  . Duodenal stent placement 12/11/2012    Procedure: DUODENAL STENT PLACEMENT;  Surgeon: Louis Meckel, MD;  Location: [facility]  ENDOSCOPY;  Service: Endoscopy;  Laterality: N/A;  . Esophagogastroduodenoscopy 12/13/2012    Procedure: ESOPHAGOGASTRODUODENOSCOPY (EGD);  Surgeon: Louis Meckel, MD;  Location: [facility] OR;  Service: Endoscopy;  Laterality: N/A;  with attempt to stent duodenal stricture  . Peg placement 12/12/2012    Procedure: PERCUTANEOUS ENDOSCOPIC GASTROSTOMY (PEG) PLACEMENT;  Surgeon: Louis Meckel, MD;  Location: [facility] ENDOSCOPY;  Service: Endoscopy;  Laterality: N/A;  . Small intestine surgery jan 2013    [facility].  SB resection, ileostomy, gastrostomy  . Debulking sep 2013    [facility].  ileal SB resection, debulking of peritoneal carcinomatosis, intraperitoneal chemotherapy  . Colon surgery oct 2013    [facility] transverse colon perforation    Oran Rein, RD, LDN Clinical Inpatient Dietitian Pager:  [phone] Weekend and after hours pager:  [phone]

## 2012-12-22 ENCOUNTER — Inpatient Hospital Stay (HOSPITAL_COMMUNITY): Payer: 59

## 2012-12-22 DIAGNOSIS — R579 Shock, unspecified: Secondary | ICD-10-CM | POA: Diagnosis not present

## 2012-12-22 DIAGNOSIS — A419 Sepsis, unspecified organism: Secondary | ICD-10-CM | POA: Diagnosis not present

## 2012-12-22 DIAGNOSIS — C482 Malignant neoplasm of peritoneum, unspecified: Secondary | ICD-10-CM

## 2012-12-22 LAB — COMPREHENSIVE METABOLIC PANEL
AST: 66 U/L — ABNORMAL HIGH (ref 0–37)
BUN: 38 mg/dL — ABNORMAL HIGH (ref 6–23)
CO2: 28 mEq/L (ref 19–32)
Calcium: 10.5 mg/dL (ref 8.4–10.5)
Creatinine, Ser: 0.64 mg/dL (ref 0.50–1.35)
GFR calc Af Amer: >90 mL/min (ref >90)
GFR calc non Af Amer: >90 mL/min (ref >90)

## 2012-12-22 LAB — CBC
HCT: 30.3 % — ABNORMAL LOW (ref 39.0–52.0)
Platelets: 201 10*3/uL (ref 150–400)
RDW: 14.9 % (ref 11.5–15.5)
WBC: 7.3 10*3/uL (ref 4.0–10.5)

## 2012-12-22 LAB — CORTISOL: Cortisol, Plasma: 26.7 ug/dL

## 2012-12-22 LAB — DIFFERENTIAL
Basophils Absolute: 0 10*3/uL (ref 0.0–0.1)
Basophils Relative: 0 % (ref 0–1)
Lymphocytes Relative: 18 % (ref 12–46)
Neutro Abs: 4.3 10*3/uL (ref 1.7–7.7)
Neutrophils Relative %: 58 % (ref 43–77)

## 2012-12-22 LAB — URINALYSIS, ROUTINE W REFLEX MICROSCOPIC
Glucose, UA: NEGATIVE mg/dL
Nitrite: POSITIVE — AB
Specific Gravity, Urine: 1.029 (ref 1.005–1.030)
pH: 5 (ref 5.0–8.0)

## 2012-12-22 LAB — GLUCOSE, CAPILLARY

## 2012-12-22 LAB — PROCALCITONIN: Procalcitonin: 38.9 ng/mL

## 2012-12-22 LAB — CHOLESTEROL, TOTAL: Cholesterol: 115 mg/dL (ref 0–200)

## 2012-12-22 LAB — PHOSPHORUS: Phosphorus: 4.1 mg/dL (ref 2.3–4.6)

## 2012-12-22 LAB — URINE MICROSCOPIC-ADD ON

## 2012-12-22 LAB — MAGNESIUM: Magnesium: 1.5 mg/dL (ref 1.5–2.5)

## 2012-12-22 MED ORDER — SODIUM CHLORIDE 0.9 % IV BOLUS (SEPSIS): 1000.0000 mL | INTRAVENOUS | Status: AC

## 2012-12-22 MED ORDER — SODIUM CHLORIDE 0.9 % IV SOLN
400.0000 mg | Freq: Once | INTRAVENOUS | Status: AC
  Administered 2012-12-22: 400 mg via INTRAVENOUS
  Filled 2012-12-22: qty 4

## 2012-12-22 MED ORDER — TRACE MINERALS CR-CU-F-FE-I-MN-MO-SE-ZN IV SOLN
INTRAVENOUS | Status: AC
  Administered 2012-12-22: 18:00:00 via INTRAVENOUS
  Filled 2012-12-22: qty 3000

## 2012-12-22 MED ORDER — SODIUM CHLORIDE 0.9 % IV BOLUS (SEPSIS)
1000.0000 mL | Freq: Once | INTRAVENOUS | Status: AC
  Administered 2012-12-22: 1000 mL via INTRAVENOUS

## 2012-12-22 MED ORDER — FAT EMULSION 20 % IV EMUL
250.0000 mL | INTRAVENOUS | Status: AC
  Administered 2012-12-22: 250 mL via INTRAVENOUS
  Filled 2012-12-22: qty 250

## 2012-12-22 MED ORDER — MUPIROCIN 2 % EX OINT
1.0000 "application " | TOPICAL_OINTMENT | Freq: Two times a day (BID) | CUTANEOUS | Status: AC
  Administered 2012-12-22 – 2012-12-27 (×10): 1 via NASAL
  Filled 2012-12-22: qty 22

## 2012-12-22 MED ORDER — SODIUM CHLORIDE 0.9 % IV BOLUS (SEPSIS)
500.0000 mL | Freq: Once | INTRAVENOUS | Status: AC
  Administered 2012-12-22: 500 mL via INTRAVENOUS

## 2012-12-22 MED ORDER — PIPERACILLIN-TAZOBACTAM 3.375 G IVPB
3.3750 g | Freq: Three times a day (TID) | INTRAVENOUS | Status: DC
  Administered 2012-12-22 – 2012-12-24 (×6): 3.375 g via INTRAVENOUS
  Filled 2012-12-22 (×9): qty 50

## 2012-12-22 MED ORDER — IOHEXOL 300 MG/ML  SOLN
50.0000 mL | Freq: Once | INTRAMUSCULAR | Status: AC | PRN
  Administered 2012-12-22: 50 mL

## 2012-12-22 MED ORDER — SODIUM CHLORIDE 0.9 % IV SOLN: INTRAVENOUS | Status: AC

## 2012-12-22 MED ORDER — LIDOCAINE HCL 1 % IJ SOLN
INTRAMUSCULAR | Status: AC
  Filled 2012-12-22: qty 20

## 2012-12-22 MED ORDER — VANCOMYCIN HCL IN DEXTROSE 1-5 GM/200ML-% IV SOLN
1000.0000 mg | Freq: Three times a day (TID) | INTRAVENOUS | Status: DC
  Administered 2012-12-22 – 2012-12-23 (×3): 1000 mg via INTRAVENOUS
  Filled 2012-12-22 (×5): qty 200

## 2012-12-22 MED ORDER — CHLORHEXIDINE GLUCONATE CLOTH 2 % EX PADS
6.0000 | MEDICATED_PAD | Freq: Every day | CUTANEOUS | Status: AC
  Administered 2012-12-23 – 2012-12-28 (×2): 6 via TOPICAL

## 2012-12-22 NOTE — Progress Notes (Signed)
PULMONARY  / CRITICAL CARE MEDICINE  Name: [name] MRN: [id number] DOB: December 19, 1955    LOS: 3  REFERRING MD :  Hongagli  CHIEF COMPLAINT:  Shock  BRIEF PATIENT DESCRIPTION: 82 yobm with abdominal carcimomatosis transferred to [facility] 1/3 with sbo s/p surgery 8 weeks pta [facility]  10/2012 with multiple drains in place eval and rx by GI and IR.  Oncology turned down for further chemo on 1/5. Surgery also  In lumen of gb. Pm 1/6 developed hypotension and tachycardia and PCCM service asked to assume care on arrival to ICU.    LINES / TUBES: PIC Right  [facility] ? Date >>>  CULTURES: BC x 2 1/6 >>>  ANTIBIOTICS: Zosyn 1/6 >>> Vanc 1/6 >>>    SIGNIFICANT EVENTS:     LEVEL OF CARE:  ICU PRIMARY SERVICE:  PCCM CONSULTANTS:  GI/ IR CODE STATUS FULL DIET:  NP0 / TNA DVT Px:  LMWH GI Px:  PPI IV     PAST MEDICAL HISTORY :  Past Medical History  Diagnosis Date  . Hypertension   . GERD (gastroesophageal reflux disease)   . Anemia   . Small bowel obstruction 2013  . 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 Acute cholecystitis s/p perc draianage [id number] 12/20/2012   Past Surgical History  Procedure Date  . Tonsillectomy   . Esophagogastroduodenoscopy 12/11/2012    Procedure: ESOPHAGOGASTRODUODENOSCOPY (EGD);  Surgeon: Louis Meckel, MD;  Location: [facility] ENDOSCOPY;  Service: Endoscopy;  Laterality: N/A;  . Duodenal stent placement 12/11/2012    Procedure: DUODENAL STENT PLACEMENT;  Surgeon: Louis Meckel, MD;  Location: [facility] ENDOSCOPY;  Service: Endoscopy;  Laterality: N/A;  . Esophagogastroduodenoscopy 12/13/2012    Procedure: ESOPHAGOGASTRODUODENOSCOPY (EGD);  Surgeon: Louis Meckel, MD;  Location: [facility] OR;  Service: Endoscopy;  Laterality: N/A;  with attempt to stent duodenal stricture  . Peg placement 12/12/2012    Procedure: PERCUTANEOUS ENDOSCOPIC GASTROSTOMY (PEG)  PLACEMENT;  Surgeon: Louis Meckel, MD;  Location: [facility] ENDOSCOPY;  Service: Endoscopy;  Laterality: N/A;  . Small intestine surgery jan 2013    [facility].  SB resection, ileostomy, gastrostomy  . Debulking sep 2013    [facility].  ileal SB resection, debulking of peritoneal carcinomatosis, intraperitoneal chemotherapy  . Colon surgery oct 2013    [facility] transverse colon perforation   Prior to Admission medications   Medication Sig Start Date End Date Taking? Authorizing Provider  allopurinol (ZYLOPRIM) 100 MG tablet Take 100 mg by mouth daily.   Yes Historical Provider, MD  aluminum-magnesium hydroxide-simethicone (MAALOX) 200-200-20 MG/5ML SUSP Take 30 mLs by mouth every 6 (six) hours as needed. For indigestion   Yes Historical Provider, MD  cholecalciferol (VITAMIN D) 1000 UNITS tablet Take 1,000 Units by mouth daily.   Yes Historical Provider, MD  cloNIDine (CATAPRES - DOSED IN MG/24 HR) 0.2 mg/24hr patch Place 1 patch onto the skin once a week. Apply every Friday   Yes Historical Provider, MD  gabapentin (NEURONTIN) 300 MG capsule Take 300 mg by mouth 3 (three) times daily.   Yes Historical Provider, MD  HYDROcodone-acetaminophen (NORCO/VICODIN) 5-325 MG per tablet Take 1 tablet by mouth every 4 (four) hours as needed. For moderate pain   Yes Historical Provider, MD  ibuprofen (ADVIL,MOTRIN) 200 MG tablet Take 200 mg by mouth every 6 (six) hours as needed. For mild pain   Yes Historical Provider, MD  menthol-cetylpyridinium (CEPACOL) 3  MG lozenge Take 1 lozenge by mouth every 2 (two) hours as needed. Sore throat   Yes Historical Provider, MD  metoCLOPramide (REGLAN) 5 MG/ML injection Inject 10 mg into the vein 4 (four) times daily.   Yes Historical Provider, MD  metoprolol succinate (TOPROL-XL) 25 MG 24 hr tablet Take 25 mg by mouth 2 (two) times daily.   Yes Historical Provider, MD  Multiple Vitamins-Minerals (MULTIVITAMIN WITH MINERALS) tablet Take 1 tablet by mouth daily.   Yes Historical  Provider, MD  nitroGLYCERIN (NITROSTAT) 0.4 MG SL tablet Place 0.4 mg under the tongue every 5 (five) minutes as needed. For chest pain, Hold for SBP<100   Yes Historical Provider, MD  ondansetron (ZOFRAN) 4 MG/2ML SOLN injection Inject 4 mg into the vein 4 (four) times daily.   Yes Historical Provider, MD  Ondansetron HCl (ZOFRAN) 2 MG/ML SOLN injection Inject 4 mg into the vein every 4 (four) hours as needed. For nausea/vomiting   Yes Historical Provider, MD  pantoprazole (PROTONIX) 40 MG injection Inject 40 mg into the vein every 12 (twelve) hours.   Yes Historical Provider, MD  pregabalin (LYRICA) 25 MG capsule Take 25 mg by mouth 2 (two) times daily.   Yes Historical Provider, MD  promethazine (PHENERGAN) 25 MG/ML injection Inject 25 mg into the vein every 8 (eight) hours as needed. Nausea/vomiting   Yes Historical Provider, MD  sennosides-docusate sodium (SENOKOT-S) 8.6-50 MG tablet Take 1 tablet by mouth 3 (three) times daily as needed. Constipation   Yes Historical Provider, MD  sodium bicarbonate 650 MG tablet Take 650 mg by mouth as needed. For clogged tube   Yes Historical Provider, MD  sodium chloride (OCEAN) 0.65 % nasal spray Place 1 spray into the nose as needed. Nasal congestion   Yes Historical Provider, MD   Allergies  Allergen Reactions  . Percocet (Oxycodone-Acetaminophen)     hallucination    FAMILY HISTORY:  History reviewed. No pertinent family history. SOCIAL HISTORY:  reports that he has never smoked. He has never used smokeless tobacco. He reports that he does not drink alcohol or use illicit drugs.         VITAL SIGNS: Temp:  [97.5 F (36.4 C)-99.7 F (37.6 C)] 98.5 F (36.9 C) (01/06 1459) Pulse Rate:  [82-134] 134  (01/06 1459) Resp:  [16-27] 22  (01/06 1459) BP: (66-150)/(42-115) 66/42 mmHg (01/06 1459) SpO2:  [81 %-100 %] 99 % (01/06 1459) FIO2  2lpm   HEMODYNAMICS:   VENTILATOR SETTINGS:   INTAKE / OUTPUT: Intake/Output      01/05 0701 -  01/06 0700 01/06 0701 - 01/07 0700   Other 237    NG/GT 750    TPN 3768.2    Total Intake(mL/kg) 4755.2 (51.4)    Urine (mL/kg/hr) 150 (0.1)    Emesis/NG output 750    Drains     Stool 80    Total Output 980    Net +3775.2           PHYSICAL EXAMINATION: General:    LABS: Cbc  Lab 12/22/12 0920 12/20/12 0420 12/19/12 0644  WBC 7.3 -- --  HGB 10.2* 10.8* 10.3*  HCT 30.3* 32.4* 31.6*  PLT 201 223 193    Chemistry   Lab 12/22/12 0920 12/21/12 0640 12/20/12 0420  NA 130* 132* 132*  K 4.2 3.7 4.5  CL 95* 95* 94*  CO2 28 28 29   BUN 38* 37* 38*  CREATININE 0.64 0.64 0.60  CALCIUM 10.5 10.2 10.1  MG  1.5 1.5 1.6  PHOS 4.1 4.2 4.7*  GLUCOSE 164* 160* 155*    Liver fxn  Lab 12/22/12 0920 12/21/12 0640 12/20/12 0420  AST 66* 127* 88*  ALT 115* 157* 96*  ALKPHOS 290* 269* 270*  BILITOT 1.5* 1.9* 1.2  PROT 6.7 6.5 6.8  ALBUMIN 2.5* 2.6* 2.7*   coags No results found for this basename: APTT:3,INR:3 in the last 168 hours Sepsis markers  Lab 12/18/12 0445 12/16/12 0450  LATICACIDVEN -- --  PROCALCITON 0.18 0.20   Cardiac markers No results found for this basename: CKTOTAL:3,CKMB:3,TROPONINI:3 in the last 168 hours BNP No results found for this basename: PROBNP:3 in the last 168 hours ABG No results found for this basename: PHART:3,PCO2ART:3,PO2ART:3,HCO3:3,TCO2:3 in the last 168 hours  CBG trend  Lab 12/22/12 1212 12/22/12 0619 12/21/12 2355 12/21/12 1738 12/21/12 1154  GLUCAP 144* 198* 174* 149* 141*    IMAGING: pcxr 1/6 > No acute cardiopulmonary disease.     DIAGNOSES: Principal Problem:  *Peritoneal carcinomatosis Active Problems:  ANEMIA  SBO (small bowel obstruction)  Nausea and vomiting in adult  Anemia of chronic disease  Colo-enteric fistula  Acute cholecystitis s/p perc draianage [id number]  Fistula of small intestine   ASSESSMENT / PLAN:  1)  Shock partially responsive to IV Fluid typical of sepsis with relative hypovolemia P  Check cvp/ PCT protocol/ Cortisol levels   2) ? GI vs line sepsis  P  Vanc / Zosyn empirically for now as per dashboard  3) Abd carcinomatosis with bowel and bile duct obstruction, not a candidate for surgery or chemo. P Readdress EOL issues daily    Clearly the high likelihood of prolonging suffering from pulmonary interventions vastly outweighs any reasonable chance of benefit from offering anything else because medical science has done all it can here to restore health.  Therefore  I don't have any additional recs  except to consider hospice sooner rather than later       I have personally obtained a history, examined the patient, evaluated laboratory and imaging results, formulated the assessment and plan and placed orders. CRITICAL CARE: The patient is critically ill with multiple organ systems failure and requires high complexity decision making for assessment and support, frequent evaluation and titration of therapies, application of advanced monitoring technologies and extensive interpretation of multiple databases. Critical Care Time devoted to patient care services described in this note is 45 minutes.    Pulmonary and Critical Care Medicine [facility] Pager: [phone]  12/22/2012, 4:20 PM

## 2012-12-22 NOTE — Procedures (Signed)
Procedure:  Cholangiogram through existing catheter and cholecystostomy tube change Findings:  Pre-existing drain lies just outside lumen of gallbladder.  Tract to GB recanalized.  Cholangiogram shows debris/sludge/stones in GB and poor, sluggish flow via cystic duct into CBD.  CBD demonstrates debris/sludge as well.  Contrast injection at level of GB also shows a separate fistula to adjacent small bowel (not duodenum).  Opacified SB loops show extremely poor to very little motility. New 10 Fr multipurpose drain placed in lumen of gallbladder and connected to small drainage bag.

## 2012-12-22 NOTE — Progress Notes (Signed)
eLink Physician-Brief Progress Note Patient Name: [name] DOB: 1955-12-28 MRN: [id number]  Date of Service  12/22/2012   HPI/Events of Note  Persisting hypotension   eICU Interventions  Gave additional NaCL bolus 1.0 L   Intervention Category Major Interventions: Shock - evaluation and management  Shan Levans 12/22/2012, 8:01 PM

## 2012-12-22 NOTE — Progress Notes (Signed)
Patient ID: HINES KLOSS, male   DOB: 06/10/1956, 57 y.o.   MRN: [id number]  Subjective: No complaints, denies nausea,vomiting, pain.  Wants to get rid of NGT  Objective:  Vital signs:  Filed Vitals:   12/21/12 0505 12/21/12 1741 12/21/12 2055 12/22/12 0615  BP: 99/72 97/69 101/68 91/64  Pulse: 99 82 105 96  Temp: 99.1 F (37.3 C) 98.4 F (36.9 C) 99.7 F (37.6 C) 98.7 F (37.1 C)  TempSrc: Oral Oral Oral Oral  Resp: 18 18 18 16   Height:      Weight:      SpO2: 100% 100% 100% 99%    Last BM Date: 12/19/12  Intake/Output   Yesterday:  01/05 0701 - 01/06 0700 In: 7829.2 [NG/GT:750; TPN:3768.2] Out: 980 [Urine:150; Emesis/NG output:750; Stool:80] This shift:     Abdomen: Soft.  Nondistended.  Midline incision closed.   Upper midline Eakin's pouch.   Light muscus  RLQ ostomy - no output/flatus  RUQ bilary/GB drain to Foley gravity bag - minimal bile output.   Problem List:  Principal Problem:  *Peritoneal carcinomatosis Active Problems:  ANEMIA  SBO (small bowel obstruction)  Nausea and vomiting in adult  Anemia of chronic disease  Colo-enteric fistula  Acute cholecystitis s/p perc draianage [id number]  Fistula of small intestine   Assessment      Peritoneal carcinomatosis s/p SB resections & intraperitoneal chemotherapy complicated by colon perf, SB fistula, cholecystitis, jejunal SB obstruction treated by perc & surgical drainage, NGT, SB stenting, and TNA   Plan: -Per Dr. Michaell Cowing "ideally, pt should consider f/u w operating surgeon at [facility] but has Med Onc care via [place]" -drain studies today LLQ surgical drain - d/c if no fistula RUQ bile drain - consider d/c if bile system patent  -pack LUQ wound w NU Gauze -WOCN care of upper midline wound - Eakins pouch -NGT for SBO.  -TNA  Tx per pt/family/Med Onc   -VTE prophylaxis- SCDs, etc -mobilize as tolerated to help recovery  WHITE, ELIZABETH 12/22/2012 8:47 AM  Pt seen and examined.  Very  complicated. I do not think that I can offer him any relief with surgery.  I would recommend transfer to [facility] to consider other options.  I would not remove any tubes without clearing this with his surgeon, Dr. [name], first.

## 2012-12-22 NOTE — Progress Notes (Addendum)
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 Percocet (Oxycodone-Acetaminophen)     hallucination   Patient Measurements: Height: 6\' 4"  (193 cm) Weight: 204 lb (92.534 kg) IBW/kg (Calculated) : 86.8  Adjusted Body Weight:  Usual Weight:   Vital Signs: Temp: 98.7 F (37.1 C) (01/06 0615) Temp src: Oral (01/06 0615) BP: 91/64 mmHg (01/06 0615) Pulse Rate: 96  (01/06 0615) Intake/Output from previous day: 01/05 0701 - 01/06 0700 In: 4755.2 [NG/GT:750; ZOX:0960.4] Out: 980 [Urine:150; Emesis/NG output:750; Stool:80]  Labs:  [facility] 12/22/12 0920 12/20/12 0420  WBC 7.3 6.0  HGB 10.2* 10.8*  HCT 30.3* 32.4*  PLT 201 223  APTT -- --  INR -- --     Basename 12/22/12 0920 12/21/12 0640 12/20/12 0420  NA 130* 132* 132*  K 4.2 3.7 4.5  CL 95* 95* 94*  CO2 28 28 29   GLUCOSE 164* 160* 155*  BUN 38* 37* 38*  CREATININE 0.64 0.64 0.60  LABCREA -- -- --  CREAT24HRUR -- -- --  CALCIUM 10.5 10.2 10.1  MG 1.5 1.5 1.6  PHOS 4.1 4.2 4.7*  PROT 6.7 6.5 6.8  ALBUMIN 2.5* 2.6* 2.7*  AST 66* 127* 88*  ALT 115* 157* 96*  ALKPHOS 290* 269* 270*  BILITOT 1.5* 1.9* 1.2  BILIDIR -- -- --  IBILI -- -- --  PREALBUMIN -- -- 29.8  TRIG -- -- 66  CHOLHDL -- -- --  CHOL 115 -- 94   Estimated Creatinine Clearance: 126.6 ml/min (by C-G formula based on Cr of 0.64).   Medications:  Scheduled:     . allopurinol  100 mg Oral Daily  . antiseptic oral rinse  15 mL Mouth Rinse BID  . cloNIDine  0.2 mg Transdermal Weekly  . enoxaparin (LOVENOX) injection  40 mg Subcutaneous Q24H  . gabapentin  300 mg Oral TID  . insulin aspart  0-9 Units Subcutaneous Q6H  . lip balm  1 application Topical BID  . [COMPLETED] magnesium sulfate 1 - 4 g bolus IVPB  2 g Intravenous Once  . metoCLOPramide  10 mg Intravenous QID  . pantoprazole  40 mg Intravenous Q12H   Infusions:      . [EXPIRED] TPN (CLINIMIX) +/- additives 115 mL/hr at 12/20/12 1737  . TPN (CLINIMIX) +/- additives 115 mL/hr at 12/21/12 1712   Insulin Requirements in the past 24 hours:  CBG 141-198 3 units Novolog given since 18:00 (one dose missed) 25 units regular insulin in TPN bag Per [facility] med list, patient on SSI and regular insulin in TPN (44.6 units)  Current Nutrition:  Clinimix E5/20 at 162ml/hr to provide Protein 138gm and 2909 kcal MWF, 2429 Kcal TTSS for avg of 2634 kcal TPN PTA to provide 2624 Kcals, 140g Protein, fluid (125 ml/hr over 24 hrs) NPO No maintenance IV fluid  Assessment: 56 YOM with complex history including terminal peritoneal carcinomatosis s/p chemo.  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  Pt with LLQ drain, LUQ drain, ileostomy, choly drain, NG tube.  Drain studies to eval if LLQ drain can be removed (if no fistula). Pt [facility]20[facility] for SBO management, N/V, high NG output. Plans to place overlapping doudenal stent?.   Labs: Na=130 (low but stable), Phos 4.1, Mg = 1.5 (2gm bolus 1/5) Corrected Ca sl elevated at 11.3 (ca x phos product = 46.3)  Renal: wnl,  LFTs: AST/ALT trending up, Tbili = 1.9.  CBGs:  CBGs a bit above goal of 150 mg/dl IVF: NO orders GI PPx: Protonix 40mg  IV daily Trigs: 66 on1/4 Prealbumin: 29.8 on 1/4  Nutritional Goals:  Pending RD evaluation Continuing approx same formula as LTACH  Plan:   Continue Clinimix E 5/20 at 170ml/hr. Lipids MWF d/t backorder.    Increase insulin to 30 units/bag and monitor CBGs   TNA to contain standard multivitamins and trace elements (MWF only due to ongoing shortage).  LFTs/Tibli increased - but on the decrease. ?? related to TPN but surprised not present at admission as on long-term TPN. (off and on TPN since sept and appears has been on for at least one month).  Continue to monitor and adjust TPN as  needed.  Electrolytes: note that Na++ cannot be adjusted in TNA, for BMET and Magnesium level in am.                Otho Bellows   PharmD Pager: [phone] 12/22/2012 11:08 AM

## 2012-12-22 NOTE — Progress Notes (Signed)
Continuously monitored pt throughout shift. Pt went down to radiology approximately 0930 when pt returned (1100) he had chills, was shaking, and stated that he felt wrong. Checked pt's vitals.No fever. Oxygen level was fluctuating as low as 60% and as high as 90%. When checked with portable oxygen monitor he was 81%. Placed pt on 2L oxygen nasal canula. Oxygen level began to stabilize. Called rapid response and Dr. Waymon Amato. Pt's lung sounds were diminished but clear. After rapid response and Dr. Waymon Amato assessed pt orders were placed for 500 ml NS bolus, blood cultures, flu PCR, UA, and chest xray stat. Pt was placed on droplet precaution. Continued to monitor pt and his vital signs. Placed pt on continuous maintenance fluid. Pt's blood pressure came back down to his average 90s/50s. Pt was brought back down to radiology where tubing was replaced. When pt returned (1500) he stated that he was extremely nauseas. Immediately hooked pt back up to suctioning with his NG tube and administered Reglan. Pt's BP was 60s/40s when checked multiple times. Called rapid response. After further assessment from rapid response and Dr. Waymon Amato decision was made for pt to be transferred to step down ICU. Reported off to [employer].

## 2012-12-22 NOTE — Progress Notes (Signed)
PT Cancellation Note  Patient Details Name: [name] MRN: [id number] DOB: [birth year]   Cancelled Treatment:    Reason Eval/Treat Not Completed: Medical issues which prohibited therapy (rapid response called )   Rada Hay 12/22/2012, 11:36 [phone]

## 2012-12-22 NOTE — Progress Notes (Signed)
[facility] Progress Note  SUBJECTIVE: no specific complaints.   OBJECTIVE:  Vital signs in last 24 hours: Temp:  [98.4 F (36.9 C)-99.7 F (37.6 C)] 98.7 F (37.1 C) (01/06 0615) Pulse Rate:  [82-105] 96  (01/06 0615) Resp:  [16-18] 16  (01/06 0615) BP: (91-101)/(64-69) 91/64 mmHg (01/06 0615) SpO2:  [99 %-100 %] 99 % (01/06 0615) Last BM Date: 12/19/12 General:   Pleasant black male in NAD Abdomen:  Soft, nontender and nondistended. Normal bowel sounds. No output in ostomy. No output in perc biliary drain.  Extremities:  Without edema. Neurologic:  Alert and oriented,  grossly normal neurologically. Psych:  Cooperative. Normal mood and affect.  Intake/Output from previous day: 01/05 0701 - 01/06 0700 In: 4755.2 [NG/GT:750; NFA:2130.8] Out: 980 [Urine:150; Emesis/NG output:750; Stool:80] Intake/Output this shift:    Lab Results:  Basename 12/20/12 0420  WBC 6.0  HGB 10.8*  HCT 32.4*  PLT 223   BMET  Basename 12/21/12 0640 12/20/12 0420 12/19/12 1845  NA 132* 132* 131*  K 3.7 4.5 5.4*  CL 95* 94* 94*  CO2 28 29 28   GLUCOSE 160* 155* 110*  BUN 37* 38* 35*  CREATININE 0.64 0.60 0.52  CALCIUM 10.2 10.1 10.1   LFT  Basename 12/21/12 0640  PROT 6.5  ALBUMIN 2.6*  AST 127*  ALT 157*  ALKPHOS 269*  BILITOT 1.9*  BILIDIR --  IBILI --    ASSESSMENT / PLAN:   Peritoneal carcinomatosis. He is s/p small bowel resection and intraperitoneal chemotherapy. 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 Patient seen by [place] for nausea and vomiting last month while at [facility] He was found by UGI series to have an obstruction at ligament of Treitz. We attempted stent placement but couldn't traverse stricture. A second attempt 12/13/12 was successful at placing the stent.  Clinically patient is still obstructed with NGT output, no ostomy output. Surgery has reevaluated and no other surgical options left.  Unfortunately, there isn't anything else we can offer at this time. [name] doesn't feel patient candidate for chemotherapy at this time.      LOS: 3 days   Willette Cluster  12/22/2012, 8:33 AM  ________________________________________________________________________  [facility] MD note:  I personally examined the patient, reviewed the data and agree with the assessment and plan described above.  Difficult situation.  Enteral stent placed 1-2 weeks ago allows liquid to pass (there is contrast distal to the stent on CT last week) but he still has high NG output.  Imaging suggests stomach may not be functioning well and Dr. Arlyce Dice felt PEG tube could not be placed (indistensible stomach).  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 I don't think further enteral stenting is going to be helpful.    Rob Bunting, MD [facility] Pager [phone]

## 2012-12-22 NOTE — Progress Notes (Signed)
[facility] PROGRESS NOTE  [name] GNF:[id number] DOB: August 13, 1956 DOA: 12/19/2012 PCP: [name], MD  Assessment/Plan: 1. Shock: Likely secondary to sepsis-? Cholangitis following biliary procedures today. Transfer patient to intensive care unit. Aggressive IV fluid and broad-spectrum IV antibiotics-IV Zosyn and vancomycin. Panculture and influenza testing. Critical care medicine consulted and requested to take over care. 2. Peritoneal carcinomatosis diagnosed in December 2012 s/p extensive debulking surgery at [facility] twice (1st time January 2013 second time 08/2012) . The second time the patient developed postoperative complications and required reintervention and JP drains and NPo status - currently with SBO and had SB stent placed by Dr. Arlyce Dice during admission at [facility] on 12/27 to resolve obstruction - apparently the patient needs a new procedure with a new stent to be placed in the duodenum- patient still with NG tube and TNA - GI does not recommend any further stenting and surgery recommends followup with primary surgeon at [facility] for further complicated surgical needs. Oncology consultation appreciated and not a candidate for chemotherapy at this time. Updated oncologist on call regarding patient's declining status. 3. Small bowel obstruction: Currently conservative management-n.p.o., NG tube and TNA. GI, IR and general surgery R. consulting-do not see a role for any further aggressive intervention. 4. Anemia: Stable 5. Cholecystitis status post cholecystostomy drain placed at [facility] - IR performed cholecystostomy tube change on 1/6.  Code Status: Full Family Communication: Discussed with patient. Disposition Plan: Transfer to ICU. Poor overall prognosis.   Consultants:  Oncology   [facility]   IR  General surgery.  Critical care medicine.  Procedures:  NG tube  Change of cholecystostomy tube by IR on 1/6  Antibiotics:  IV vancomycin  1/6 >  IV Zosyn 1/6 >  HPI/Subjective: Chills and nausea after returning from procedure. Also noted to be hypotensive and tachycardic.  Objective: Filed Vitals:   12/22/12 1130 12/22/12 1144 12/22/12 1215 12/22/12 1459  BP: 92/56  90/59 66/42  Pulse:   132 134  Temp:  98.4 F (36.9 C) 97.5 F (36.4 C) 98.5 F (36.9 C)  TempSrc:  Oral Oral Oral  Resp:   22 22  Height:      Weight:      SpO2: 93%  93% 99%    Intake/Output Summary (Last 24 hours) at 12/22/12 1744 Last data filed at 12/22/12 0700  Gross per 24 hour  Intake 4755.17 ml  Output    350 ml  Net 4405.17 ml   Filed Weights   12/19/12 1646  Weight: 92.534 kg (204 lb)    Exam:   General exam: Looks uncomfortable. Having rigors  Respiratory system: Clear to auscultation. No increased work of breathing.  Cardiovascular system: First and second heart sounds heard, regular rate and rhythm. No JVD, murmurs or pedal edema  Gastroenterology: Abdomen is nondistended, soft and nontender. RLQ ostomy draining cloudy liquid with sediments. RUQ biliary/gallbladder drain with minimal output. Upper midline Eakin's  pouch with minimal fluid.  Data Reviewed: Basic Metabolic Panel:  Lab 12/22/12 8469 12/21/12 0640 12/20/12 0420 12/19/12 1845 12/19/12 0644 12/18/12 0445  NA 130* 132* 132* 131* 136 --  K 4.2 3.7 4.5 5.4* 4.5 --  CL 95* 95* 94* 94* 98 --  CO2 28 28 29 28 28  --  GLUCOSE 164* 160* 155* 110* 111* --  BUN 38* 37* 38* 35* 35* --  CREATININE 0.64 0.64 0.60 0.52 0.65 --  CALCIUM 10.5 10.2 10.1 10.1 9.5 --  MG 1.5 1.5 1.6 -- -- 1.9  PHOS 4.1 4.2 4.7* -- -- 3.3   Liver Function Tests:  Lab 12/22/12 0920 12/21/12 0640 12/20/12 0420 12/18/12 0445  AST 66* 127* 88* 40*  ALT 115* 157* 96* 33  ALKPHOS 290* 269* 270* 256*  BILITOT 1.5* 1.9* 1.2 0.5  PROT 6.7 6.5 6.8 6.6  ALBUMIN 2.5* 2.6* 2.7* 2.6*   No results found for this basename: LIPASE:5,AMYLASE:5 in the last 168 hours No results found for this  basename: AMMONIA:5 in the last 168 hours CBC:  Lab 12/22/12 0920 12/20/12 0420 12/19/12 0644 12/19/12 0355 12/18/12 2134  WBC 7.3 6.0 5.7 6.3 --  NEUTROABS 4.3 3.5 -- -- --  HGB 10.2* 10.8* 10.3* 9.2* 11.3*  HCT 30.3* 32.4* 31.6* 30.9* 34.0*  MCV 83.7 84.4 85.9 94.5 --  PLT 201 223 193 195 --   Cardiac Enzymes: No results found for this basename: CKTOTAL:5,CKMB:5,CKMBINDEX:5,TROPONINI:5 in the last 168 hours BNP (last 3 results) No results found for this basename: PROBNP:3 in the last 8760 hours CBG:  Lab 12/22/12 1212 12/22/12 0619 12/21/12 2355 12/21/12 1738 12/21/12 1154  GLUCAP 144* 198* 174* 149* 141*    No results found for this or any previous visit (from the past 240 hour(s)).   Studies: Ir Cholan Exist Tube  12/22/2012  *RADIOLOGY REPORT*  Clinical Data:  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  The patient is also status post prior percutaneous cholecystostomy tube placement for acalculous cholecystitis.  Assessment of the cholecystostomy tube is performed.  1.  CHOLANGIOGRAM THROUGH PREEXISTING CATHETER 2.  PERCUTANEOUS CHOLECYSTOSTOMY TUBE EXCHANGE  Contrast:  50 ml Omnipaque-300  Fluoroscopy Time: 7.5 minutes.  Procedure:  The procedure, risks, benefits, and alternatives were explained to the patient.  Questions regarding the procedure were encouraged and answered.  The patient understands and consents to the procedure.  The preexisting percutaneous cholecystostomy tube was prepped with Betadine in a sterile fashion, and a sterile drape was applied covering the operative field.  A sterile gown and sterile gloves were used for the procedure.  The preexisting catheter was injected with contrast material under fluoroscopy.  It was then removed over a guidewire.  A 5-French catheter was then introduced over a guidewire and advanced into the lumen of the gallbladder.  Additional contrast injection was performed.  The catheter  was further advanced through the cystic duct and into the common bile duct and additional cholangiogram performed through the catheter.  The catheter was then retracted into the lumen of the gallbladder.  A new 10-French multipurpose drainage catheter was advanced over a wire and formed in the gallbladder lumen.  Final catheter position was confirmed with a fluoroscopic spot image obtained after injection of contrast.  The catheter was secured at the skin with a Prolene retention suture and Stat-Lock device.  The catheter was connected to a gravity drainage bag.  Complications:  None  Findings:  Injection of a preexisting drain shows the catheter to lie just outside the lumen of the gallbladder.  Contrast injection does partially fill the gallbladder lumen and demonstrates a significant amount of internal debris and sludge within the gallbladder.  After removal of the preexisting catheter, a percutaneous tract to the gallbladder lumen was able to be catheterized.  Cholangiogram shows very slow and sluggish flow of contrast via the cystic duct into the common bile duct.  A significant amount of debris is present extending into the cystic duct and also within the distal aspect of the common bile duct.  With a catheter further advanced into the CBD, contrast injection does show a patent common bile duct with flow of contrast noted via the ampulla into the duodenum.  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  There is a fistula demonstrated to adjacent small bowel with eventual opacification of several right sided small bowel loops demonstrating poor motility.  An additional fistula extends medially into a space adjacent to the descending duodenum.  Given demonstration of fistulas as well as poor flow of contrast from the gallbladder into the common bile duct, a new cholecystostomy tube was placed and formed at the level of the gallbladder lumen.   This will be left to gravity drainage.  IMPRESSION: The preexisting catheter lies just outside the lumen of the gallbladder.  After recanalizing a tract to the gallbladder lumen, contrast injection demonstrates significant debris in the lumen of the gallbladder and poor flow of contrast into the common bile duct.  Debris was also present in the distal aspect of the CBD. The study also demonstrated complex fistulas communicating with the gallbladder lumen and opacifying both small bowel as well as a tract to a cavity lying adjacent to the descending duodenum.  A new 10-French catheter was formed in the gallbladder and will be left to gravity drainage.   Original Report Authenticated By: Irish Lack, M.D.    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  The patient is also status post prior percutaneous cholecystostomy tube placement for acalculous cholecystitis.  Assessment of the cholecystostomy tube is performed.  1.  CHOLANGIOGRAM THROUGH PREEXISTING CATHETER 2.  PERCUTANEOUS CHOLECYSTOSTOMY TUBE EXCHANGE  Contrast:  50 ml Omnipaque-300  Fluoroscopy Time: 7.5 minutes.  Procedure:  The procedure, risks, benefits, and alternatives were explained to the patient.  Questions regarding the procedure were encouraged and answered.  The patient understands and consents to the procedure.  The preexisting percutaneous cholecystostomy tube was prepped with Betadine in a sterile fashion, and a sterile drape was applied covering the operative field.  A sterile gown and sterile gloves were used for the procedure.  The preexisting catheter was injected with contrast material under fluoroscopy.  It was then removed over a guidewire.  A 5-French catheter was then introduced over a guidewire and advanced into the lumen of the gallbladder.  Additional contrast injection was performed.  The catheter was  further advanced through the cystic duct and into the common bile duct and additional cholangiogram performed through the catheter.  The catheter was then retracted into the lumen of the gallbladder.  A new 10-French multipurpose drainage catheter was advanced over a wire and formed in the gallbladder lumen.  Final catheter position was confirmed with a fluoroscopic spot image obtained after injection of contrast.  The catheter was secured at the skin with a Prolene retention suture and Stat-Lock device.  The catheter was connected to a gravity drainage bag.  Complications:  None  Findings:  Injection of a preexisting drain shows the catheter to lie just outside the lumen of the gallbladder.  Contrast injection does partially fill the gallbladder lumen and demonstrates a significant amount of internal debris and sludge within the gallbladder.  After removal of the preexisting catheter, a percutaneous tract to the gallbladder lumen was able to be catheterized.  Cholangiogram shows very slow and sluggish flow of contrast via the cystic duct  into the common bile duct.  A significant amount of debris is present extending into the cystic duct and also within the distal aspect of the common bile duct.  With a catheter further advanced into the CBD, contrast injection does show a patent common bile duct with flow of contrast noted via the ampulla into the duodenum.  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  There is a fistula demonstrated to adjacent small bowel with eventual opacification of several right sided small bowel loops demonstrating poor motility.  An additional fistula extends medially into a space adjacent to the descending duodenum.  Given demonstration of fistulas as well as poor flow of contrast from the gallbladder into the common bile duct, a new cholecystostomy tube was placed and formed at the level of the gallbladder lumen.  This  will be left to gravity drainage.  IMPRESSION: The preexisting catheter lies just outside the lumen of the gallbladder.  After recanalizing a tract to the gallbladder lumen, contrast injection demonstrates significant debris in the lumen of the gallbladder and poor flow of contrast into the common bile duct.  Debris was also present in the distal aspect of the CBD. The study also demonstrated complex fistulas communicating with the gallbladder lumen and opacifying both small bowel as well as a tract to a cavity lying adjacent to the descending duodenum.  A new 10-French catheter was formed in the gallbladder and will be left to gravity drainage.   Original Report Authenticated By: Irish Lack, M.D.    Dg Sinus/fist Tube Chk-non Gi  12/22/2012  *RADIOLOGY REPORT*  Clinical Data:Evaluate for fistula  ABSCESS INJECTION  Fluoroscopy Time: 1.09 minutes  Comparison: CT of the abdomen and pelvis dated 12/16/2012  Findings: Scout radiograph was obtained.  This shows a nasogastric tube within the stomach.  There is a metallic stent identified within the proximal jejunum.  JP drain overlies the left lower quadrant of the abdomen there is a percutaneous cholecystostomy tube within the right abdomen.  Left lower quadrant the JP drain was accessed and 40 ml of omni 300 was injected into the drain.  No significant extravasation of contrast material from the draining identified to suggest residual abscess or fistula formation.  Mild intra vascular extravasation with opacification of the left lower quadrant venous structures noted.  IMPRESSION: 1.  No evidence for residual abscess or fistula formation surrounding the left lower quadrant JP drain.  Findings were discussed with Dr. Michaell Cowing at the time the procedure was performed on 12/22/2012.   Original Report Authenticated By: Signa Kell, M.D.    Dg Chest Port 1 View  12/22/2012  *RADIOLOGY REPORT*  Clinical Data: Chills.  Shortness of breath.  PORTABLE CHEST - 1 VIEW   Comparison: 11/27/2012  Findings: Left Port-A-Cath and right PICC line remain in place, unchanged.  NG tube has been placed into the stomach.  Lungs are clear.  No effusions.  Heart is normal size.  IMPRESSION: No acute cardiopulmonary disease.   Original Report Authenticated By: Charlett Nose, M.D.     Scheduled Meds:    . allopurinol  100 mg Oral Daily  . antiseptic oral rinse  15 mL Mouth Rinse BID  . cloNIDine  0.2 mg Transdermal Weekly  . enoxaparin (LOVENOX) injection  40 mg Subcutaneous Q24H  . gabapentin  300 mg Oral TID  . insulin aspart  0-9 Units Subcutaneous Q6H  . lidocaine      . lip balm  1 application Topical BID  . metoCLOPramide  10 mg Intravenous QID  . pantoprazole  40 mg Intravenous Q12H  . piperacillin-tazobactam (ZOSYN)  IV  3.375 g Intravenous Q8H  . sodium chloride  1,000 mL Intravenous Once  . vancomycin  1,000 mg Intravenous Q8H   Continuous Infusions:    . sodium chloride    . TPN (CLINIMIX) +/- additives     And  . fat emulsion    . TPN (CLINIMIX) +/- additives 115 mL/hr at 12/21/12 1712    Principal Problem:  *Peritoneal carcinomatosis Active Problems:  ANEMIA  SBO (small bowel obstruction)  Nausea and vomiting in adult  Anemia of chronic disease  Colo-enteric fistula  Acute cholecystitis s/p perc draianage [id number]  Fistula of small intestine  Shock     [facility]  [facility] Pager [phone]. If 8PM-8AM, please contact night-coverage at [email or web address], password [facility] 12/22/2012, 5:44 PM  LOS: 3 days

## 2012-12-22 NOTE — Progress Notes (Signed)
eLink Physician-Brief Progress Note Patient Name: [name] DOB: September 07, 1956 MRN: [id number]  Date of Service  12/22/2012   HPI/Events of Note   Fever and pain  eICU Interventions  IV ibuprofen   Intervention Category Minor Interventions: Routine modifications to care plan (e.g. PRN medications for pain, fever)  Shan Levans 12/22/2012, 8:43 PM

## 2012-12-22 NOTE — Progress Notes (Signed)
ANTIBIOTIC CONSULT NOTE - INITIAL  Pharmacy Consult for Vanc/Zosyn Indication: ? Ascending Cholangitis  Allergies  Allergen Reactions  . Percocet (Oxycodone-Acetaminophen)     hallucination    Patient Measurements: Height: 6\' 4"  (193 cm) Weight: 204 lb (92.534 kg) IBW/kg (Calculated) : 86.8  Adjusted Body Weight:  Vital Signs: Temp: 98.5 F (36.9 C) (01/06 1459) Temp src: Oral (01/06 1459) BP: 66/42 mmHg (01/06 1459) Pulse Rate: 134  (01/06 1459) Intake/Output from previous day: 01/05 0701 - 01/06 0700 In: 4755.2 [NG/GT:750; ONG:2952.8] Out: 980 [Urine:150; Emesis/NG output:750; Stool:80] Intake/Output from this shift:    Labs:  Basename 12/22/12 0920 12/21/12 0640 12/20/12 0420  WBC 7.3 -- 6.0  HGB 10.2* -- 10.8*  PLT 201 -- 223  LABCREA -- -- --  CREATININE 0.64 0.64 0.60   Estimated Creatinine Clearance: 126.6 ml/min (by C-G formula based on Cr of 0.64). No results found for this basename: VANCOTROUGH:2,VANCOPEAK:2,VANCORANDOM:2,GENTTROUGH:2,GENTPEAK:2,GENTRANDOM:2,TOBRATROUGH:2,TOBRAPEAK:2,TOBRARND:2,AMIKACINPEAK:2,AMIKACINTROU:2,AMIKACIN:2, in the last 72 hours   Microbiology: No results found for this or any previous visit (from the past 720 hour(s)).  Medical History: Past Medical History  Diagnosis Date  . Hypertension   . GERD (gastroesophageal reflux disease)   . Anemia   . Small bowel obstruction 2013  . 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 Acute cholecystitis s/p perc draianage [id number] 12/20/2012    Medications:  Scheduled:    . allopurinol  100 mg Oral Daily  . antiseptic oral rinse  15 mL Mouth Rinse BID  . cloNIDine  0.2 mg Transdermal Weekly  . enoxaparin (LOVENOX) injection  40 mg Subcutaneous Q24H  . gabapentin  300 mg Oral TID  . insulin aspart  0-9 Units Subcutaneous Q6H  . lidocaine      . lip balm  1 application Topical BID  . metoCLOPramide  10 mg  Intravenous QID  . pantoprazole  40 mg Intravenous Q12H  . piperacillin-tazobactam (ZOSYN)  IV  3.375 g Intravenous Q8H  . sodium chloride  1,000 mL Intravenous Once  . [COMPLETED] sodium chloride  500 mL Intravenous Once  . [COMPLETED] sodium chloride  500 mL Intravenous Once  . vancomycin  1,000 mg Intravenous Q8H   Infusions:    . sodium chloride    . TPN (CLINIMIX) +/- additives     And  . fat emulsion    . [EXPIRED] TPN (CLINIMIX) +/- additives 115 mL/hr at 12/20/12 1737  . TPN (CLINIMIX) +/- additives 115 mL/hr at 12/21/12 1712   PRN: alum & mag hydroxide-simeth, bisacodyl, diphenhydrAMINE, [COMPLETED] iohexol, magic mouthwash, menthol-cetylpyridinium, ondansetron, phenol, promethazine, traMADol Assessment:  57 yo M with possible ascending cholangitis.  Hx of peritoneal carcinomatosis dx 12/12. Surgery twice Jan 2013.   Biliary/GB drain with minimal output  Goal of Therapy:  Vancomycin trough level 15-20 mcg/ml  Plan:  1. Vancomycin !000mg  IV q 8 hours. Will proceed with q 8 hour dosing to start with based on patient's size and renal clearance. Measure antibiotic drug levels at steady state 2.  Zosyn EI 3.  Follow up with culture results Loletta Specter 12/22/2012,3:40 PM

## 2012-12-22 NOTE — Progress Notes (Signed)
COURTESY VISIT:  Called by Dr [name] regarding this 57 year-old man with abdominal carcinomatosis and possible cholangitis. Family had questions regarding possible use of radiation and hyperbaric oxygen. I discussed those options with them and explained why they would not help the patient. I explained that chemotherapy in someone as debilitated as [name] may shorten a person's life. The family has had the patient's tumor tested for chemo sensitivities through the [facility] protocol, and they want to discuss the options that suggests with Dr [name]. He will be back tomorrow. Please let me know if I can be of further help.

## 2012-12-22 NOTE — Significant Event (Signed)
Rapid Response Event Note   Overview: Second call for unstable BP and HR, transferred to [facility]      Initial Focused Assessment: HR 129, RR 30, Temp 99.0, BP 74/50, Pt c/o pain mid abd wound with drain 8/10. RN reports last CBG required 1u of insulin coverage.    Interventions:1L NS bolus initiated, Dr Waymon Amato notified, see orders. Son at bedside and updated.   Event Summary: Transferred to ICU with CCM consult, BP 89/54 HR 127 after 500cc bolus infused.     at      at          [facility], Ferd Hibbs

## 2012-12-22 NOTE — Consult Note (Signed)
WOC ostomy consult: Patient not seen today as Rapid Response Team  Call Initiated just prior to my visit.  Patient with chills and reports he could not "catch his breath". Abdomen briefly assessed and supplies ordered to bedside for use tomorrow. Stoma type/location: RLQ ileostomy, midline upper quadrant fistula. 1pc pouching system in place over RLQ ileostomy; intact.  Pouch Lawson# is [phone].  I will plan a pouch change on Tuesday using the same system and a skin barrier ring (Lawson# [phone]).  Eakin Pouch (Medium, Lawson# [id number]) intact; supply ordered to bedside.  Note from Oncology appreciated. I will follow along with you.  Please re-consult if needed in-between visits. Thanks, Ladona Mow, MSN, RN, [facility], CWOCN [phone])

## 2012-12-23 DIAGNOSIS — A419 Sepsis, unspecified organism: Secondary | ICD-10-CM

## 2012-12-23 LAB — GLUCOSE, CAPILLARY
Glucose-Capillary: 134 mg/dL — ABNORMAL HIGH (ref 70–99)
Glucose-Capillary: 141 mg/dL — ABNORMAL HIGH (ref 70–99)
Glucose-Capillary: 142 mg/dL — ABNORMAL HIGH (ref 70–99)
Glucose-Capillary: 151 mg/dL — ABNORMAL HIGH (ref 70–99)

## 2012-12-23 LAB — BASIC METABOLIC PANEL
Calcium: 8.2 mg/dL — ABNORMAL LOW (ref 8.4–10.5)
GFR calc non Af Amer: 67 mL/min — ABNORMAL LOW (ref >90)
Glucose, Bld: 148 mg/dL — ABNORMAL HIGH (ref 70–99)
Sodium: 132 mEq/L — ABNORMAL LOW (ref 135–145)

## 2012-12-23 LAB — PROCALCITONIN: Procalcitonin: 43.31 ng/mL

## 2012-12-23 LAB — MAGNESIUM: Magnesium: 1.3 mg/dL — ABNORMAL LOW (ref 1.5–2.5)

## 2012-12-23 MED ORDER — SODIUM CHLORIDE 0.9 % IV SOLN
INTRAVENOUS | Status: DC
  Administered 2012-12-23: 23:00:00 via INTRAVENOUS

## 2012-12-23 MED ORDER — MAGNESIUM SULFATE 40 MG/ML IJ SOLN
2.0000 g | Freq: Once | INTRAMUSCULAR | Status: AC
  Administered 2012-12-23: 2 g via INTRAVENOUS
  Filled 2012-12-23: qty 50

## 2012-12-23 MED ORDER — SODIUM CHLORIDE 0.9 % IV BOLUS (SEPSIS)
500.0000 mL | Freq: Once | INTRAVENOUS | Status: AC
  Administered 2012-12-23: 500 mL via INTRAVENOUS

## 2012-12-23 MED ORDER — VANCOMYCIN HCL IN DEXTROSE 1-5 GM/200ML-% IV SOLN
1000.0000 mg | Freq: Two times a day (BID) | INTRAVENOUS | Status: DC
  Administered 2012-12-23: 1000 mg via INTRAVENOUS
  Filled 2012-12-23 (×2): qty 200

## 2012-12-23 MED ORDER — SODIUM CHLORIDE 0.9 % IV SOLN
INTRAVENOUS | Status: AC
  Administered 2012-12-23: 125 mL/h via INTRAVENOUS
  Administered 2012-12-23 (×2): via INTRAVENOUS

## 2012-12-23 MED ORDER — INSULIN REGULAR HUMAN 100 UNIT/ML IJ SOLN
INTRAMUSCULAR | Status: AC
  Administered 2012-12-23: 17:00:00 via INTRAVENOUS
  Filled 2012-12-23: qty 2000

## 2012-12-23 NOTE — Progress Notes (Signed)
Subjective: Feels okay this am after transfer to ICU for suspected cholangitis  Objective: Vital signs in last 24 hours: Temp:  [97.5 F (36.4 C)-103 F (39.4 C)] 98.6 F (37 C) (01/07 0400) Pulse Rate:  [66-134] 89  (01/07 0700) Resp:  [17-35] 31  (01/07 0700) BP: (66-150)/(36-115) 112/58 mmHg (01/07 0700) SpO2:  [81 %-100 %] 100 % (01/07 0700) Weight:  [206 lb 2.1 oz (93.5 kg)] 206 lb 2.1 oz (93.5 kg) (01/07 0315) Last BM Date: 12/19/12  Intake/Output from previous day: 01/06 0701 - 01/07 0700 In: 16109 [I.V.:3775; NG/GT:400; IV Piggyback:1550; TPN:3015] Out: 2475 [Urine:870; Emesis/NG output:970; Drains:315; Stool:320] Intake/Output this shift:    General appearance: alert, cooperative and no distress Resp: nonlabored Cardio: normal rate GI: soft, NT, ND, not much out of drains.  stable fistulas. 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  The patient is also status post prior percutaneous cholecystostomy tube placement for acalculous cholecystitis.  Assessment of the cholecystostomy tube is performed.  1.  CHOLANGIOGRAM THROUGH PREEXISTING CATHETER 2.  PERCUTANEOUS CHOLECYSTOSTOMY TUBE EXCHANGE  Contrast:  50 ml Omnipaque-300  Fluoroscopy Time: 7.5 minutes.  Procedure:  The procedure, risks, benefits, and alternatives were explained to the patient.  Questions regarding the procedure  were encouraged and answered.  The patient understands and consents to the procedure.  The preexisting percutaneous cholecystostomy tube was prepped with Betadine in a sterile fashion, and a sterile drape was applied covering the operative field.  A sterile gown and sterile gloves were used for the procedure.  The preexisting catheter was injected with contrast material under fluoroscopy.  It was then removed over a guidewire.  A 5-French catheter was then introduced over a guidewire and advanced into the lumen of the gallbladder.  Additional contrast injection was performed.  The catheter was further advanced through the cystic duct and into the common bile duct and additional cholangiogram performed through the catheter.  The catheter was then retracted into the lumen of the gallbladder.  A new 10-French multipurpose drainage catheter was advanced over a wire and formed in the gallbladder lumen.  Final catheter position was confirmed with a fluoroscopic spot image obtained after injection of contrast.  The catheter was secured at the skin with a Prolene retention suture and Stat-Lock device.  The catheter was connected to a gravity drainage bag.  Complications:  None  Findings:  Injection of a preexisting drain shows the catheter to lie just outside the lumen of the gallbladder.  Contrast injection does partially fill the gallbladder lumen and demonstrates a significant amount of internal debris and sludge within the gallbladder.  After removal of the preexisting catheter, a percutaneous tract to the gallbladder lumen was able to be catheterized.  Cholangiogram shows very slow and sluggish flow of contrast via the cystic duct into the common bile duct.  A  significant amount of debris is present extending into the cystic duct and also within the distal aspect of the common bile duct.  With a catheter further advanced into the CBD, contrast injection does show a patent common bile duct with flow of contrast noted  via the ampulla into the duodenum.  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  There is a fistula demonstrated to adjacent small bowel with eventual opacification of several right sided small bowel loops demonstrating poor motility.  An additional fistula extends medially into a space adjacent to the descending duodenum.  Given demonstration of fistulas as well as poor flow of contrast from the gallbladder into the common bile duct, a new cholecystostomy tube was placed and formed at the level of the gallbladder lumen.  This will be left to gravity drainage.  IMPRESSION: The preexisting catheter lies just outside the lumen of the gallbladder.  After recanalizing a tract to the gallbladder lumen, contrast injection demonstrates significant debris in the lumen of the gallbladder and poor flow of contrast into the common bile duct.  Debris was also present in the distal aspect of the CBD. The study also demonstrated complex fistulas communicating with the gallbladder lumen and opacifying both small bowel as well as a tract to a cavity lying adjacent to the descending duodenum.  A new 10-French catheter was formed in the gallbladder and will be left to gravity drainage.   Original Report Authenticated By: Irish Lack, M.D.    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  The patient is also status post prior percutaneous cholecystostomy tube placement for acalculous cholecystitis.  Assessment of the cholecystostomy tube is performed.  1.  CHOLANGIOGRAM THROUGH PREEXISTING CATHETER 2.  PERCUTANEOUS CHOLECYSTOSTOMY TUBE EXCHANGE  Contrast:  50 ml Omnipaque-300  Fluoroscopy Time: 7.5 minutes.  Procedure:  The procedure, risks, benefits, and alternatives were explained to the patient.  Questions regarding the procedure were  encouraged and answered.  The patient understands and consents to the procedure.  The preexisting percutaneous cholecystostomy tube was prepped with Betadine in a sterile fashion, and a sterile drape was applied covering the operative field.  A sterile gown and sterile gloves were used for the procedure.  The preexisting catheter was injected with contrast material under fluoroscopy.  It was then removed over a guidewire.  A 5-French catheter was then introduced over a guidewire and advanced into the lumen of the gallbladder.  Additional contrast injection was performed.  The catheter was further advanced through the cystic duct and into the common bile duct and additional cholangiogram performed through the catheter.  The catheter was then retracted into the lumen of the gallbladder.  A new 10-French multipurpose drainage catheter was advanced over a wire and formed in the gallbladder lumen.  Final catheter position was confirmed with a fluoroscopic spot image obtained after injection of contrast.  The catheter was secured at the skin with a Prolene retention suture and Stat-Lock device.  The catheter was connected to a gravity drainage bag.  Complications:  None  Findings:  Injection of a preexisting drain shows the catheter to lie just outside the lumen of the gallbladder.  Contrast injection does partially fill the gallbladder lumen and demonstrates a significant amount of internal debris and sludge within the gallbladder.  After removal of the preexisting catheter, a percutaneous tract  to the gallbladder lumen was able to be catheterized.  Cholangiogram shows very slow and sluggish flow of contrast via the cystic duct into the common bile duct.  A significant amount of debris is present extending into the cystic duct and also within the distal aspect of the common bile duct.  With a catheter further advanced into the CBD, contrast injection does show a patent common bile duct with flow of contrast noted via the  ampulla into the duodenum.  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  There is a fistula demonstrated to adjacent small bowel with eventual opacification of several right sided small bowel loops demonstrating poor motility.  An additional fistula extends medially into a space adjacent to the descending duodenum.  Given demonstration of fistulas as well as poor flow of contrast from the gallbladder into the common bile duct, a new cholecystostomy tube was placed and formed at the level of the gallbladder lumen.  This will be left to gravity drainage.  IMPRESSION: The preexisting catheter lies just outside the lumen of the gallbladder.  After recanalizing a tract to the gallbladder lumen, contrast injection demonstrates significant debris in the lumen of the gallbladder and poor flow of contrast into the common bile duct.  Debris was also present in the distal aspect of the CBD. The study also demonstrated complex fistulas communicating with the gallbladder lumen and opacifying both small bowel as well as a tract to a cavity lying adjacent to the descending duodenum.  A new 10-French catheter was formed in the gallbladder and will be left to gravity drainage.   Original Report Authenticated By: Irish Lack, M.D.    Dg Sinus/fist Tube Chk-non Gi  12/22/2012  *RADIOLOGY REPORT*  Clinical Data:Evaluate for fistula  ABSCESS INJECTION  Fluoroscopy Time: 1.09 minutes  Comparison: CT of the abdomen and pelvis dated 12/16/2012  Findings: Scout radiograph was obtained.  This shows a nasogastric tube within the stomach.  There is a metallic stent identified within the proximal jejunum.  JP drain overlies the left lower quadrant of the abdomen there is a percutaneous cholecystostomy tube within the right abdomen.  Left lower quadrant the JP drain was accessed and 40 ml of omni 300 was injected into the drain.  No significant extravasation of contrast  material from the draining identified to suggest residual abscess or fistula formation.  Mild intra vascular extravasation with opacification of the left lower quadrant venous structures noted.  IMPRESSION: 1.  No evidence for residual abscess or fistula formation surrounding the left lower quadrant JP drain.  Findings were discussed with Dr. Michaell Cowing at the time the procedure was performed on 12/22/2012.   Original Report Authenticated By: Signa Kell, M.D.    Dg Chest Port 1 View  12/22/2012  *RADIOLOGY REPORT*  Clinical Data: Chills.  Shortness of breath.  PORTABLE CHEST - 1 VIEW  Comparison: 11/27/2012  Findings: Left Port-A-Cath and right PICC line remain in place, unchanged.  NG tube has been placed into the stomach.  Lungs are clear.  No effusions.  Heart is normal size.  IMPRESSION: No acute cardiopulmonary disease.   Original Report Authenticated By: Charlett Nose, M.D.     Anti-infectives: Anti-infectives     Start     Dose/Rate Route Frequency Ordered Stop   12/22/12 1600   vancomycin (VANCOCIN) IVPB 1000 mg/200 mL premix        1,000 mg 200 mL/hr over 60 Minutes Intravenous Every 8  hours 12/22/12 1539     12/22/12 1600   piperacillin-tazobactam (ZOSYN) IVPB 3.375 g        3.375 g 12.5 mL/hr over 240 Minutes Intravenous Every 8 hours 12/22/12 1539            Assessment/Plan: s/p * No surgery found * no new surgery recommendations.  I have recommended contacting his surgeon Dr. [name] at [facility] for management of his tubes (I would not remove any of the tubes without discussing it with him prior) as well as any new recommendations for surgery.      LOS: 4 days    Lodema Pilot DAVID 12/23/2012

## 2012-12-23 NOTE — Evaluation (Signed)
Physical Therapy Evaluation Patient Details Name: [name] MRN: [id number] DOB: 17-Oct-1956 Today's Date: 12/23/2012 Time: 4098-1191 PT Time Calculation (min): 27 min  PT Assessment / Plan / Recommendation Clinical Impression  Pt presents with peritoneal carcinomatosis s/p percutaneious cholecystostomy tube exchange.  Per pt and notes, he was at [facility] prior to hospital admission.  He states he was working with therapy, however hasn't gotten up with them in a few weeks.  Tolerated OOB and took some steps from bed to chair, however noted increased fatigue and WOB during activity.  HR up to 137 during movement.  RN aware.  BP remained stable throughout.  Pt will benefit from skilled PT in acute venue to address deficits.  PT recommends returning to [facility] due to level of care needed at this time.  If not, then SNF recommended.      PT Assessment  Patient needs continued PT services    Follow Up Recommendations  SNF;LTACH;Supervision/Assistance - 24 hour    Does the patient have the potential to tolerate intense rehabilitation      Barriers to Discharge None      Equipment Recommendations  Rolling walker with 5" wheels    Recommendations for Other Services OT consult   Frequency Min 3X/week    Precautions / Restrictions Precautions Precautions: Fall Precaution Comments: Multiple lines/drains Restrictions Weight Bearing Restrictions: No   Pertinent Vitals/Pain No pain      Mobility  Bed Mobility Bed Mobility: Supine to Sit;Sitting - Scoot to Edge of Bed Supine to Sit: 4: Min assist;3: Mod assist Sitting - Scoot to [place] of Bed: 4: Min assist Details for Bed Mobility Assistance: Assist for trunk to sit at EOB with cues for hand placement on bed/rails to self assist.  Transfers Transfers: Sit to Stand;Stand to Sit Sit to Stand: 1: +2 Total assist;From elevated surface;With upper extremity assist;From bed Sit to Stand: Patient Percentage: 60% Stand to Sit: 1: +2  Total assist;With upper extremity assist;With armrests;To chair/3-in-1 Stand to Sit: Patient Percentage: 60% Details for Transfer Assistance: Assist to rise and steady with cues for hand placement, safety and ensuring controlled descent when sitting.  Ambulation/Gait Ambulation/Gait Assistance: 1: +2 Total assist Ambulation/Gait: Patient Percentage: 50% Ambulation Distance (Feet): 4 Feet Assistive device: Rolling walker Ambulation/Gait Assistance Details: Pt able to take some steps from bed to chair with assist to steady and maintain upright stance with cues for sequencing/technique with RW, maintaining upright posture and continued pursed lip breathing.  HR up to 137 with activity.  RN aware.   Gait Pattern: Step-through pattern;Decreased stride length;Shuffle;Narrow base of support;Trunk flexed Gait velocity: decreased General Gait Details: Pt with diffuse muscle atrophy and overall very weak.  He states that he feels like his legs are "going to break."  did not note any knee buckling during session.  Stairs: No Wheelchair Mobility Wheelchair Mobility: No    Shoulder Instructions     Exercises     PT Diagnosis: Difficulty walking;Generalized weakness  PT Problem List: Decreased strength;Decreased activity tolerance;Decreased balance;Decreased mobility;Decreased knowledge of use of DME;Decreased knowledge of precautions PT Treatment Interventions: DME instruction;Gait training;Functional mobility training;Therapeutic activities;Therapeutic exercise;Balance training;Patient/family education   PT Goals Acute Rehab PT Goals PT Goal Formulation: With patient Time For Goal Achievement: 01/06/13 Potential to Achieve Goals: Good Pt will go Supine/Side to Sit: with supervision PT Goal: Supine/Side to Sit - Progress: Goal set today Pt will go Sit to Supine/Side: with supervision PT Goal: Sit to Supine/Side - Progress: Goal set today Pt  will go Sit to Stand: with supervision PT Goal: Sit to  Stand - Progress: Goal set today Pt will go Stand to Sit: with supervision PT Goal: Stand to Sit - Progress: Goal set today Pt will Ambulate: 16 - 50 feet;with mod assist;with least restrictive assistive device PT Goal: Ambulate - Progress: Goal set today Pt will Perform Home Exercise Program: with supervision, verbal cues required/provided PT Goal: Perform Home Exercise Program - Progress: Goal set today  Visit Information  Last PT Received On: 12/23/12 Assistance Needed: +2    Subjective Data  Subjective: I used to be so athletic.  Patient Stated Goal: to be able to walk again.    Prior Functioning  Home Living Lives With: Other (Comment) (Was at [facility]) Additional Comments: Pt states he was ambulating with therapy with RW, however it has been weeks since he has gotten up with therapy.   Prior Function Level of Independence: Needs assistance Able to Take Stairs?: No Driving: No Vocation: Retired Musician: No difficulties    Cognition  Overall Cognitive Status: Appears within functional limits for tasks assessed/performed Arousal/Alertness: Awake/alert Orientation Level: Appears intact for tasks assessed Behavior During Session: [facility] for tasks performed    Extremity/Trunk Assessment Right Lower Extremity Assessment RLE ROM/Strength/Tone: Deficits RLE ROM/Strength/Tone Deficits: Pt with generalized weakness, grossly 2+/5 since able to stand RLE Sensation: WFL - Light Touch Left Lower Extremity Assessment LLE ROM/Strength/Tone: Deficits LLE ROM/Strength/Tone Deficits: Pt with generalized weakness, grossly 2+/5 since able to stand.  LLE Sensation: WFL - Light Touch Trunk Assessment Trunk Assessment: Kyphotic   Balance Balance Balance Assessed: Yes Static Sitting Balance Static Sitting - Balance Support: Bilateral upper extremity supported;Feet supported Static Sitting - Level of Assistance: 5: Stand by assistance  End of Session PT - End of  Session Equipment Utilized During Treatment: Gait belt Activity Tolerance: Patient limited by fatigue Patient left: in chair;with call bell/phone within reach Nurse Communication: Mobility status  GP     Page, Meribeth Mattes 12/23/2012, 12:06 PM

## 2012-12-23 NOTE — Progress Notes (Signed)
Hypomagnesemia   Mg replaced  

## 2012-12-23 NOTE — Progress Notes (Signed)
ANTIBIOTIC CONSULT NOTE - FOLLOW UP  Pharmacy Consult for Vancomycin, Zosyn Indication: r/o sepsis  Allergies  Allergen Reactions  . Percocet (Oxycodone-Acetaminophen)     hallucination    Patient Measurements: Height: 6\' 4"  (193 cm) Weight: 206 lb 2.1 oz (93.5 kg) IBW/kg (Calculated) : 86.8   Vital Signs: Temp: 97.7 F (36.5 C) (01/07 0800) Temp src: Oral (01/07 0800) BP: 102/49 mmHg (01/07 1000) Pulse Rate: 86  (01/07 1000) Intake/Output from previous day: 01/06 0701 - 01/07 0700 In: 16109 [I.V.:3775; NG/GT:400; IV Piggyback:1550; TPN:3015] Out: 2475 [Urine:870; Emesis/NG output:970; Drains:315; Stool:320] Intake/Output from this shift: Total I/O In: 320 [I.V.:125; IV Piggyback:70; TPN:125] Out: 750 [Urine:750]  Labs:  [facility] 12/23/12 0445 12/22/12 0920 12/21/12 0640  WBC -- 7.3 --  HGB -- 10.2* --  PLT -- 201 --  LABCREA -- -- --  CREATININE 1.18 0.64 0.64   Estimated Creatinine Clearance: 85.8 ml/min (by C-G formula based on Cr of 1.18). No results found for this basename: VANCOTROUGH:2,VANCOPEAK:2,VANCORANDOM:2,GENTTROUGH:2,GENTPEAK:2,GENTRANDOM:2,TOBRATROUGH:2,TOBRAPEAK:2,TOBRARND:2,AMIKACINPEAK:2,AMIKACINTROU:2,AMIKACIN:2, in the last 72 hours   Microbiology: Recent Results (from the past 720 hour(s))  CULTURE, BLOOD (ROUTINE X 2)     Status: Normal (Preliminary result)   Collection Time   12/22/12 12:55 PM      Component Value Range Status Comment   Specimen Description BLOOD LEFT HAND   Final    Special Requests BOTTLES DRAWN AEROBIC AND ANAEROBIC 6CC   Final    Culture  Setup Time 12/22/2012 22:48   Final    Culture     Final    Value:        BLOOD CULTURE RECEIVED NO GROWTH TO DATE CULTURE WILL BE HELD FOR 5 DAYS BEFORE ISSUING A FINAL NEGATIVE REPORT   Report Status PENDING   Incomplete   CULTURE, BLOOD (ROUTINE X 2)     Status: Normal (Preliminary result)   Collection Time   12/22/12  1:05 PM      Component Value Range Status Comment   Specimen  Description BLOOD LEFT HAND   Final    Special Requests BOTTLES DRAWN AEROBIC AND ANAEROBIC 6CC   Final    Culture  Setup Time 12/22/2012 22:48   Final    Culture     Final    Value:        BLOOD CULTURE RECEIVED NO GROWTH TO DATE CULTURE WILL BE HELD FOR 5 DAYS BEFORE ISSUING A FINAL NEGATIVE REPORT   Report Status PENDING   Incomplete   MRSA PCR SCREENING     Status: Abnormal   Collection Time   12/22/12  5:42 PM      Component Value Range Status Comment   MRSA by PCR POSITIVE (*) NEGATIVE Final     Anti-infectives     Start     Dose/Rate Route Frequency Ordered Stop   12/23/12 2200   vancomycin (VANCOCIN) IVPB 1000 mg/200 mL premix        1,000 mg 200 mL/hr over 60 Minutes Intravenous Every 12 hours 12/23/12 1040     12/22/12 1600   vancomycin (VANCOCIN) IVPB 1000 mg/200 mL premix  Status:  Discontinued        1,000 mg 200 mL/hr over 60 Minutes Intravenous Every 8 hours 12/22/12 1539 12/23/12 1040   12/22/12 1600  piperacillin-tazobactam (ZOSYN) IVPB 3.375 g       3.375 g 12.5 mL/hr over 240 Minutes Intravenous Every 8 hours 12/22/12 1539            Assessment:  56  YOM with h/o terminal peritoneal carcinomatosis s/p chemo. Pt developed hypotension and tachycardia 1/6 with possible sepsis (cholangitis after biliary drain manipulation)  Day #2 Vancomycin and Zosyn  SCr dramatically increased today (0.64 --> 1.18)  Goal of Therapy:  Vancomycin trough level 15-20 mcg/ml Appropriate abx dosing, eradication of infection.  Plan:   Continue Zosyn 3.375g IV Q8H infused over 4hrs.  Empirically reduce vancomycin to 1g IV q12h  Measure Vanc trough at steady state.  Follow up renal fxn and culture results.   Lynann Beaver PharmD, BCPS Pager [phone] 12/23/2012 10:46 AM

## 2012-12-23 NOTE — Progress Notes (Signed)
[facility] PROGRESS NOTE  [name] ZOX:[id number] DOB: [birth year] DOA: 12/19/2012 PCP: [name], MD  Assessment/Plan: 1. Shock: Likely secondary to sepsis from Cholangitis following biliary procedures on 1/6/probable staph bacteremia. Transferred patient to intensive care unit. Aggressive IV fluid and broad-spectrum IV antibiotics-IV Zosyn and vancomycin. Patient blood pressure has improved. Did not require pressors. Critical care medicine input appreciated. 2. 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 Patient has had multiple surgical procedures (refer to surgeon's notes). Currently Surgery and GI does not have any further interventions to offer. Discussed with patient and his sister [name] at bedside who request discussing the situation with his primary surgeon Dr. Katherine Basset with [name] and would defer this to him.  3. Small bowel obstruction: Currently conservative management-n.p.o., NG tube and TNA. GI, IR and general surgery R. consulting-do not see a role for any further aggressive intervention. 4. Anemia: Stable 5. Cholecystitis status post cholecystostomy drain placed at [facility] - IR performed cholecystostomy tube change on 1/6. 6. Hypomagnesemia: adjust in TNA per pharmacy.  Code Status: Full Family Communication: Discussed with patient and sister [name] at bedside. Disposition Plan: Continue Management in ICU.   Consultants:  Oncology   [facility]   IR  General surgery.  Critical care medicine.  Procedures:  NG tube  Change of cholecystostomy tube by IR on 1/6  Antibiotics:  IV vancomycin 1/6 >  IV Zosyn 1/6 >  HPI/Subjective: Feels much better. Denies complaints.  Objective: Filed Vitals:   12/23/12 1000 12/23/12 1100 12/23/12 1200 12/23/12 1218  BP: 102/49 99/53 97/51    Pulse: 86 90 98   Temp:    98.3 F (36.8  C)  TempSrc:    Oral  Resp: 26 32 29   Height:      Weight:      SpO2: 100% 100% 100%     Intake/Output Summary (Last 24 hours) at 12/23/12 1457 Last data filed at 12/23/12 1219  Gross per 24 hour  Intake 12002.5 ml  Output   3475 ml  Net 8527.5 ml   Filed Weights   12/19/12 1646 12/23/12 0315  Weight: 92.534 kg (204 lb) 93.5 kg (206 lb 2.1 oz)    Exam:   General exam: Comfortable. Looks much better than 1/6  Respiratory system: Clear to auscultation. No increased work of breathing.  Cardiovascular system: First and second heart sounds heard, regular rate and rhythm. No JVD, murmurs or pedal edema  Gastroenterology: Abdomen is nondistended, soft and nontender. RLQ ostomy draining cloudy liquid with sediments. RUQ biliary/gallbladder drain with minimal output. Upper midline Eakin's  pouch with minimal fluid.  Data Reviewed: Basic Metabolic Panel:  Lab 12/23/12 8119 12/22/12 0920 12/21/12 0640 12/20/12 0420 12/19/12 1845 12/18/12 0445  NA 132* 130* 132* 132* 131* --  K 3.8 4.2 3.7 4.5 5.4* --  CL 106 95* 95* 94* 94* --  CO2 19 28 28 29 28  --  GLUCOSE 148* 164* 160* 155* 110* --  BUN 45* 38* 37* 38* 35* --  CREATININE 1.18 0.64 0.64 0.60 0.52 --  CALCIUM 8.2* 10.5 10.2 10.1 10.1 --  MG 1.3* 1.5 1.5 1.6 -- 1.9  PHOS -- 4.1 4.2 4.7* -- 3.3   Liver Function Tests:  Lab 12/22/12 0920 12/21/12 0640 12/20/12 0420 12/18/12 0445  AST 66* 127* 88* 40*  ALT 115* 157* 96* 33  ALKPHOS 290* 269* 270* 256*  BILITOT 1.5*  1.9* 1.2 0.5  PROT 6.7 6.5 6.8 6.6  ALBUMIN 2.5* 2.6* 2.7* 2.6*   No results found for this basename: LIPASE:5,AMYLASE:5 in the last 168 hours No results found for this basename: AMMONIA:5 in the last 168 hours CBC:  Lab 12/22/12 0920 12/20/12 0420 12/19/12 0644 12/19/12 0355 12/18/12 2134  WBC 7.3 6.0 5.7 6.3 --  NEUTROABS 4.3 3.5 -- -- --  HGB 10.2* 10.8* 10.3* 9.2* 11.3*  HCT 30.3* 32.4* 31.6* 30.9* 34.0*  MCV 83.7 84.4 85.9 94.5 --  PLT 201 223  193 195 --   Cardiac Enzymes: No results found for this basename: CKTOTAL:5,CKMB:5,CKMBINDEX:5,TROPONINI:5 in the last 168 hours BNP (last 3 results)  Basename 12/22/12 1700  PROBNP 33.8   CBG:  Lab 12/23/12 1217 12/23/12 0636 12/23/12 0002 12/22/12 1816 12/22/12 1212  GLUCAP 134* 142* 151* 141* 144*    Recent Results (from the past 240 hour(s))  CULTURE, BLOOD (ROUTINE X 2)     Status: Normal (Preliminary result)   Collection Time   12/22/12 12:55 PM      Component Value Range Status Comment   Specimen Description BLOOD LEFT HAND   Final    Special Requests BOTTLES DRAWN AEROBIC AND ANAEROBIC [facility]   Final    Culture  Setup Time 12/22/2012 22:48   Final    Culture     Final    Value: GRAM POSITIVE COCCI IN CLUSTERS     Note: Gram Stain Report Called to,Read Back By and Verified With: Burnell Blanks @ 1254 12/23/12 BY KRAWS   Report Status PENDING   Incomplete   CULTURE, BLOOD (ROUTINE X 2)     Status: Normal (Preliminary result)   Collection Time   12/22/12  1:05 PM      Component Value Range Status Comment   Specimen Description BLOOD LEFT HAND   Final    Special Requests BOTTLES DRAWN AEROBIC AND ANAEROBIC [facility]   Final    Culture  Setup Time 12/22/2012 22:48   Final    Culture     Final    Value: GRAM POSITIVE COCCI IN CLUSTERS     Note: Gram Stain Report Called to,Read Back By and Verified With: Burnell Blanks @ 1254 12/23/12 BY KRAWS   Report Status PENDING   Incomplete   MRSA PCR SCREENING     Status: Abnormal   Collection Time   12/22/12  5:42 PM      Component Value Range Status Comment   MRSA by PCR POSITIVE (*) NEGATIVE Final      Studies: Ir Cholan Exist Tube  12/22/2012  *RADIOLOGY REPORT*  Clinical Data:  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  The patient is also status post prior percutaneous cholecystostomy tube placement for acalculous cholecystitis.  Assessment of the cholecystostomy tube is performed.  1.   CHOLANGIOGRAM THROUGH PREEXISTING CATHETER 2.  PERCUTANEOUS CHOLECYSTOSTOMY TUBE EXCHANGE  Contrast:  50 ml Omnipaque-300  Fluoroscopy Time: 7.5 minutes.  Procedure:  The procedure, risks, benefits, and alternatives were explained to the patient.  Questions regarding the procedure were encouraged and answered.  The patient understands and consents to the procedure.  The preexisting percutaneous cholecystostomy tube was prepped with Betadine in a sterile fashion, and a sterile drape was applied covering the operative field.  A sterile gown and sterile gloves were used for the procedure.  The preexisting catheter was injected with contrast material under fluoroscopy.  It was then removed over a guidewire.  A 5-French catheter was then introduced over a guidewire and advanced into the lumen of the gallbladder.  Additional contrast injection was performed.  The catheter was further advanced through the cystic duct and into the common bile duct and additional cholangiogram performed through the catheter.  The catheter was then retracted into the lumen of the gallbladder.  A new 10-French multipurpose drainage catheter was advanced over a wire and formed in the gallbladder lumen.  Final catheter position was confirmed with a fluoroscopic spot image obtained after injection of contrast.  The catheter was secured at the skin with a Prolene retention suture and Stat-Lock device.  The catheter was connected to a gravity drainage bag.  Complications:  None  Findings:  Injection of a preexisting drain shows the catheter to lie just outside the lumen of the gallbladder.  Contrast injection does partially fill the gallbladder lumen and demonstrates a significant amount of internal debris and sludge within the gallbladder.  After removal of the preexisting catheter, a percutaneous tract to the gallbladder lumen was able to be catheterized.  Cholangiogram shows very slow and sluggish flow of contrast via the cystic duct into the  common bile duct.  A significant amount of debris is present extending into the cystic duct and also within the distal aspect of the common bile duct.  With a catheter further advanced into the CBD, contrast injection does show a patent common bile duct with flow of contrast noted via the ampulla into the duodenum.  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  There is a fistula demonstrated to adjacent small bowel with eventual opacification of several right sided small bowel loops demonstrating poor motility.  An additional fistula extends medially into a space adjacent to the descending duodenum.  Given demonstration of fistulas as well as poor flow of contrast from the gallbladder into the common bile duct, a new cholecystostomy tube was placed and formed at the level of the gallbladder lumen.  This will be left to gravity drainage.  IMPRESSION: The preexisting catheter lies just outside the lumen of the gallbladder.  After recanalizing a tract to the gallbladder lumen, contrast injection demonstrates significant debris in the lumen of the gallbladder and poor flow of contrast into the common bile duct.  Debris was also present in the distal aspect of the CBD. The study also demonstrated complex fistulas communicating with the gallbladder lumen and opacifying both small bowel as well as a tract to a cavity lying adjacent to the descending duodenum.  A new 10-French catheter was formed in the gallbladder and will be left to gravity drainage.   Original Report Authenticated By: Irish Lack, M.D.    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  The patient is also status post prior percutaneous cholecystostomy tube placement for acalculous cholecystitis.  Assessment of the cholecystostomy tube is performed.  1.   CHOLANGIOGRAM THROUGH PREEXISTING CATHETER 2.  PERCUTANEOUS CHOLECYSTOSTOMY TUBE EXCHANGE  Contrast:  50 ml Omnipaque-300  Fluoroscopy Time: 7.5 minutes.  Procedure:  The procedure, risks, benefits, and alternatives were explained to the patient.  Questions regarding the procedure were encouraged and answered.  The patient understands and consents to the procedure.  The preexisting percutaneous cholecystostomy tube was prepped with Betadine in a sterile fashion, and a sterile drape was applied covering the operative field.  A  sterile gown and sterile gloves were used for the procedure.  The preexisting catheter was injected with contrast material under fluoroscopy.  It was then removed over a guidewire.  A 5-French catheter was then introduced over a guidewire and advanced into the lumen of the gallbladder.  Additional contrast injection was performed.  The catheter was further advanced through the cystic duct and into the common bile duct and additional cholangiogram performed through the catheter.  The catheter was then retracted into the lumen of the gallbladder.  A new 10-French multipurpose drainage catheter was advanced over a wire and formed in the gallbladder lumen.  Final catheter position was confirmed with a fluoroscopic spot image obtained after injection of contrast.  The catheter was secured at the skin with a Prolene retention suture and Stat-Lock device.  The catheter was connected to a gravity drainage bag.  Complications:  None  Findings:  Injection of a preexisting drain shows the catheter to lie just outside the lumen of the gallbladder.  Contrast injection does partially fill the gallbladder lumen and demonstrates a significant amount of internal debris and sludge within the gallbladder.  After removal of the preexisting catheter, a percutaneous tract to the gallbladder lumen was able to be catheterized.  Cholangiogram shows very slow and sluggish flow of contrast via the cystic duct into the  common bile duct.  A significant amount of debris is present extending into the cystic duct and also within the distal aspect of the common bile duct.  With a catheter further advanced into the CBD, contrast injection does show a patent common bile duct with flow of contrast noted via the ampulla into the duodenum.  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  There is a fistula demonstrated to adjacent small bowel with eventual opacification of several right sided small bowel loops demonstrating poor motility.  An additional fistula extends medially into a space adjacent to the descending duodenum.  Given demonstration of fistulas as well as poor flow of contrast from the gallbladder into the common bile duct, a new cholecystostomy tube was placed and formed at the level of the gallbladder lumen.  This will be left to gravity drainage.  IMPRESSION: The preexisting catheter lies just outside the lumen of the gallbladder.  After recanalizing a tract to the gallbladder lumen, contrast injection demonstrates significant debris in the lumen of the gallbladder and poor flow of contrast into the common bile duct.  Debris was also present in the distal aspect of the CBD. The study also demonstrated complex fistulas communicating with the gallbladder lumen and opacifying both small bowel as well as a tract to a cavity lying adjacent to the descending duodenum.  A new 10-French catheter was formed in the gallbladder and will be left to gravity drainage.   Original Report Authenticated By: Irish Lack, M.D.    Dg Sinus/fist Tube Chk-non Gi  12/22/2012  *RADIOLOGY REPORT*  Clinical Data:Evaluate for fistula  ABSCESS INJECTION  Fluoroscopy Time: 1.09 minutes  Comparison: CT of the abdomen and pelvis dated 12/16/2012  Findings: Scout radiograph was obtained.  This shows a nasogastric tube within the stomach.  There is a metallic stent identified within the  proximal jejunum.  JP drain overlies the left lower quadrant of the abdomen there is a percutaneous cholecystostomy tube within the right abdomen.  Left lower quadrant the JP drain was accessed and 40 ml of omni 300 was injected into the drain.  No significant extravasation of contrast material from the draining identified to suggest residual abscess or fistula formation.  Mild intra vascular extravasation with opacification of the left lower quadrant venous structures noted.  IMPRESSION: 1.  No evidence for residual abscess or fistula formation surrounding the left lower quadrant JP drain.  Findings were discussed with Dr. Michaell Cowing at the time the procedure was performed on 12/22/2012.   Original Report Authenticated By: Signa Kell, M.D.    Dg Chest Port 1 View  12/22/2012  *RADIOLOGY REPORT*  Clinical Data: Chills.  Shortness of breath.  PORTABLE CHEST - 1 VIEW  Comparison: 11/27/2012  Findings: Left Port-A-Cath and right PICC line remain in place, unchanged.  NG tube has been placed into the stomach.  Lungs are clear.  No effusions.  Heart is normal size.  IMPRESSION: No acute cardiopulmonary disease.   Original Report Authenticated By: Charlett Nose, M.D.     Scheduled Meds:    . antiseptic oral rinse  15 mL Mouth Rinse BID  . Chlorhexidine Gluconate Cloth  6 each Topical Q0600  . enoxaparin (LOVENOX) injection  40 mg Subcutaneous Q24H  . insulin aspart  0-9 Units Subcutaneous Q6H  . lip balm  1 application Topical BID  . metoCLOPramide  10 mg Intravenous QID  . mupirocin ointment  1 application Nasal BID  . pantoprazole  40 mg Intravenous Q12H  . piperacillin-tazobactam (ZOSYN)  IV  3.375 g Intravenous Q8H  . vancomycin  1,000 mg Intravenous Q12H   Continuous Infusions:    . sodium chloride 125 mL/hr at 12/23/12 1226  . TPN (CLINIMIX) +/- additives 115 mL/hr at 12/22/12 1813   And  . fat emulsion 250 mL (12/22/12 1900)  . TPN (CLINIMIX) +/- additives      Principal Problem:   *Peritoneal carcinomatosis Active Problems:  ANEMIA  SBO (small bowel obstruction)  Nausea and vomiting in adult  Anemia of chronic disease  Colo-enteric fistula  Acute cholecystitis s/p perc draianage [id number]  Fistula of small intestine  Shock  Sepsis     [facility]  [facility] Pager [phone]. If 8PM-8AM, please contact night-coverage at [email or web address], password [facility] 12/23/2012, 2:57 PM  LOS: 4 days

## 2012-12-23 NOTE — Consult Note (Signed)
WOC ostomy consult:  Events (transfer to ICU) from yesterday and this am noted.  Both pouches intact and supplies at bedside. I will continue to follow, but not closely and remain available as needed for management of fistula pouch and ostomy pouch. Please re-consult if needed in-between visits. Thanks, Ladona Mow, MSN, RN, [facility], CWOCN [phone])

## 2012-12-23 NOTE — Progress Notes (Signed)
Oncology addendum.  I discussed again with the patient and his son today and recent findings.  I continued to repeat what I told him the last previous days.  There is no role for chemotherapy, radiation or any other intervention that I know off that would help him.  I think we will continue to decline as evident of the recent events.  I continue to encourage him  To consider the palliative care options. He is considering the palliative care consult at this time.  I also do not feel any more surgery would help nor any discussion with Dr. [name] at this point.

## 2012-12-23 NOTE — Progress Notes (Signed)
IP PROGRESS NOTE  Subjective:   Patient report feeling better compared to yesterday.  SBP is above 100 this am.   Objective:  Vital signs in last 24 hours: Temp:  [97.5 F (36.4 C)-103 F (39.4 C)] 98.6 F (37 C) (01/07 0400) Pulse Rate:  [66-134] 89  (01/07 0700) Resp:  [17-35] 31  (01/07 0700) BP: (66-150)/(36-115) 112/58 mmHg (01/07 0700) SpO2:  [81 %-100 %] 100 % (01/07 0700) Weight:  [206 lb 2.1 oz (93.5 kg)] 206 lb 2.1 oz (93.5 kg) (01/07 0315) Weight change:  Last BM Date: 12/19/12  Intake/Output from previous day: 01/06 0701 - 01/07 0700 In: 11914 [I.V.:3775; NG/GT:400; IV Piggyback:1550; TPN:2765] Out: 1825 [Urine:670; Emesis/NG output:820; Drains:115; Stool:220]  Mouth: mucous membranes moist, pharynx normal without lesions Resp: clear to auscultation bilaterally Cardio: regular rate and rhythm, S1, S2 normal, no murmur, click, rub or gallop GI: soft, non-tender; bowel sounds normal; no masses,  no organomegaly Extremities: extremities normal, atraumatic, no cyanosis or edema  Portacath/PICC-without erythema  Lab Results:  [facility] 12/22/12 0920  WBC 7.3  HGB 10.2*  HCT 30.3*  PLT 201    BMET  Basename 12/23/12 0445 12/22/12 0920  NA 132* 130*  K 3.8 4.2  CL 106 95*  CO2 19 28  GLUCOSE 148* 164*  BUN 45* 38*  CREATININE 1.18 0.64  CALCIUM 8.2* 10.5    Studies/Results: Ir Cholan Exist Tube  12/22/2012  *RADIOLOGY REPORT*  Clinical Data:  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  The patient is also status post prior percutaneous cholecystostomy tube placement for acalculous cholecystitis.  Assessment of the cholecystostomy tube is performed.  1.  CHOLANGIOGRAM THROUGH PREEXISTING CATHETER 2.  PERCUTANEOUS CHOLECYSTOSTOMY TUBE EXCHANGE  Contrast:  50 ml Omnipaque-300  Fluoroscopy Time: 7.5 minutes.  Procedure:  The procedure, risks, benefits, and alternatives were explained to the patient.   Questions regarding the procedure were encouraged and answered.  The patient understands and consents to the procedure.  The preexisting percutaneous cholecystostomy tube was prepped with Betadine in a sterile fashion, and a sterile drape was applied covering the operative field.  A sterile gown and sterile gloves were used for the procedure.  The preexisting catheter was injected with contrast material under fluoroscopy.  It was then removed over a guidewire.  A 5-French catheter was then introduced over a guidewire and advanced into the lumen of the gallbladder.  Additional contrast injection was performed.  The catheter was further advanced through the cystic duct and into the common bile duct and additional cholangiogram performed through the catheter.  The catheter was then retracted into the lumen of the gallbladder.  A new 10-French multipurpose drainage catheter was advanced over a wire and formed in the gallbladder lumen.  Final catheter position was confirmed with a fluoroscopic spot image obtained after injection of contrast.  The catheter was secured at the skin with a Prolene retention suture and Stat-Lock device.  The catheter was connected to a gravity drainage bag.  Complications:  None  Findings:  Injection of a preexisting drain shows the catheter to lie just outside the lumen of the gallbladder.  Contrast injection does partially fill the gallbladder lumen and demonstrates a significant amount of internal debris and sludge within the gallbladder.  After removal of the preexisting catheter, a percutaneous tract to the gallbladder lumen was able to be catheterized.  Cholangiogram shows very slow and sluggish flow of contrast via the cystic duct into the common  bile duct.  A significant amount of debris is present extending into the cystic duct and also within the distal aspect of the common bile duct.  With a catheter further advanced into the CBD, contrast injection does show a patent common bile  duct with flow of contrast noted via the ampulla into the duodenum.  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  There is a fistula demonstrated to adjacent small bowel with eventual opacification of several right sided small bowel loops demonstrating poor motility.  An additional fistula extends medially into a space adjacent to the descending duodenum.  Given demonstration of fistulas as well as poor flow of contrast from the gallbladder into the common bile duct, a new cholecystostomy tube was placed and formed at the level of the gallbladder lumen.  This will be left to gravity drainage.  IMPRESSION: The preexisting catheter lies just outside the lumen of the gallbladder.  After recanalizing a tract to the gallbladder lumen, contrast injection demonstrates significant debris in the lumen of the gallbladder and poor flow of contrast into the common bile duct.  Debris was also present in the distal aspect of the CBD. The study also demonstrated complex fistulas communicating with the gallbladder lumen and opacifying both small bowel as well as a tract to a cavity lying adjacent to the descending duodenum.  A new 10-French catheter was formed in the gallbladder and will be left to gravity drainage.   Original Report Authenticated By: Irish Lack, M.D.    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  The patient is also status post prior percutaneous cholecystostomy tube placement for acalculous cholecystitis.  Assessment of the cholecystostomy tube is performed.  1.  CHOLANGIOGRAM THROUGH PREEXISTING CATHETER 2.  PERCUTANEOUS CHOLECYSTOSTOMY TUBE EXCHANGE  Contrast:  50 ml Omnipaque-300  Fluoroscopy Time: 7.5 minutes.  Procedure:  The procedure, risks, benefits, and alternatives were explained to the patient.   Questions regarding the procedure were encouraged and answered.  The patient understands and consents to the procedure.  The preexisting percutaneous cholecystostomy tube was prepped with Betadine in a sterile fashion, and a sterile drape was applied covering the operative field.  A sterile gown and sterile gloves were used for the procedure.  The preexisting catheter was injected with contrast material under fluoroscopy.  It was then removed over a guidewire.  A 5-French catheter was then introduced over a guidewire and advanced into the lumen of the gallbladder.  Additional contrast injection was performed.  The catheter was further advanced through the cystic duct and into the common bile duct and additional cholangiogram performed through the catheter.  The catheter was then retracted into the lumen of the gallbladder.  A new 10-French multipurpose drainage catheter was advanced over a wire and formed in the gallbladder lumen.  Final catheter position was confirmed with a fluoroscopic spot image obtained after injection of contrast.  The catheter was secured at the skin with a Prolene retention suture and Stat-Lock device.  The catheter was connected to a gravity drainage bag.  Complications:  None  Findings:  Injection of a preexisting drain shows the catheter to lie just outside the lumen of the gallbladder.  Contrast injection does partially fill the gallbladder lumen and demonstrates a significant amount of internal debris and sludge within the gallbladder.  After removal of the preexisting  catheter, a percutaneous tract to the gallbladder lumen was able to be catheterized.  Cholangiogram shows very slow and sluggish flow of contrast via the cystic duct into the common bile duct.  A significant amount of debris is present extending into the cystic duct and also within the distal aspect of the common bile duct.  With a catheter further advanced into the CBD, contrast injection does show a patent common bile  duct with flow of contrast noted via the ampulla into the duodenum.  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  There is a fistula demonstrated to adjacent small bowel with eventual opacification of several right sided small bowel loops demonstrating poor motility.  An additional fistula extends medially into a space adjacent to the descending duodenum.  Given demonstration of fistulas as well as poor flow of contrast from the gallbladder into the common bile duct, a new cholecystostomy tube was placed and formed at the level of the gallbladder lumen.  This will be left to gravity drainage.  IMPRESSION: The preexisting catheter lies just outside the lumen of the gallbladder.  After recanalizing a tract to the gallbladder lumen, contrast injection demonstrates significant debris in the lumen of the gallbladder and poor flow of contrast into the common bile duct.  Debris was also present in the distal aspect of the CBD. The study also demonstrated complex fistulas communicating with the gallbladder lumen and opacifying both small bowel as well as a tract to a cavity lying adjacent to the descending duodenum.  A new 10-French catheter was formed in the gallbladder and will be left to gravity drainage.   Original Report Authenticated By: Irish Lack, M.D.    Dg Sinus/fist Tube Chk-non Gi  12/22/2012  *RADIOLOGY REPORT*  Clinical Data:Evaluate for fistula  ABSCESS INJECTION  Fluoroscopy Time: 1.09 minutes  Comparison: CT of the abdomen and pelvis dated 12/16/2012  Findings: Scout radiograph was obtained.  This shows a nasogastric tube within the stomach.  There is a metallic stent identified within the proximal jejunum.  JP drain overlies the left lower quadrant of the abdomen there is a percutaneous cholecystostomy tube within the right abdomen.  Left lower quadrant the JP drain was accessed and 40 ml of omni 300 was injected into the drain.  No  significant extravasation of contrast material from the draining identified to suggest residual abscess or fistula formation.  Mild intra vascular extravasation with opacification of the left lower quadrant venous structures noted.  IMPRESSION: 1.  No evidence for residual abscess or fistula formation surrounding the left lower quadrant JP drain.  Findings were discussed with Dr. Michaell Cowing at the time the procedure was performed on 12/22/2012.   Original Report Authenticated By: Signa Kell, M.D.    Dg Chest Port 1 View  12/22/2012  *RADIOLOGY REPORT*  Clinical Data: Chills.  Shortness of breath.  PORTABLE CHEST - 1 VIEW  Comparison: 11/27/2012  Findings: Left Port-A-Cath and right PICC line remain in place, unchanged.  NG tube has been placed into the stomach.  Lungs are clear.  No effusions.  Heart is normal size.  IMPRESSION: No acute cardiopulmonary disease.   Original Report Authenticated By: Charlett Nose, M.D.     Medications: I have reviewed the patient's current medications.  Assessment/Plan:  This is a pleasant 57 year old gentleman with the  following issues:  1. 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  He is S/P extensive debulking  surgery twice (1st time January 2013 second time 08/2012) . The second time the patient developed postoperative complications and required reintervention and JP drains.  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  He is getting TNA for nutrient.  I disused these findings today with Mr. Comunale again today. At this time, he is not a candidate for chemotherapy. His poor performance status, chronic obstruction as well his fistula renders the therapeutic margin for chemotherapy very slim.  Chemotherapy will likely cause more complications (infections, delayed healing and poor quality of life) than actual improvement in his clinical status. I do no think it will help resolve his  obstruction either.  I am in favor of palliative care consult to discuss goals of care and end of life issues in the complicated situation.   2. SBO (small bowel obstruction): on TNA and NJ tube. Management per GI, surgery and primary team.   3. Sepsis: Likely from biliary drain, etc.. He is broad spectrum antibiotics and supportive care.       LOS: 4 days   [facility] 12/23/2012, 7:44 AM

## 2012-12-23 NOTE — Progress Notes (Signed)
Hypotension; CVP 5  NS bolus ordered

## 2012-12-23 NOTE — Progress Notes (Signed)
Spoke with Elink MD, pt currently refusing Aline

## 2012-12-23 NOTE — Progress Notes (Signed)
PULMONARY  / CRITICAL CARE MEDICINE  Name: [name] MRN: [id number] DOB: 30-Oct-1956    LOS: 4  REFERRING MD :  Hongagli  CHIEF COMPLAINT:  Shock  BRIEF PATIENT DESCRIPTION: 28 yobm with abdominal carcimomatosis transferred to [facility] 1/3 with sbo s/p surgery 8 weeks pta initially [facility]  10/2012 with multiple drains in place eval and rx by GI and IR.  Oncology turned down for further chemo on 1/5. Surgery also  In lumen of gb. Pm 1/6 developed hypotension and tachycardia and PCCM service asked to assume care on arrival to ICU.    LINES / TUBES: PIC Right  Select ? Date >>>  CULTURES: BC x 2 1/6 >>>  ANTIBIOTICS: Zosyn 1/6 >>> Vanc 1/6 >>>    SIGNIFICANT EVENTS:    VITAL SIGNS: Temp:  [97.5 F (36.4 C)-103 F (39.4 C)] 97.7 F (36.5 C) (01/07 0800) Pulse Rate:  [66-134] 89  (01/07 0700) Resp:  [17-35] 31  (01/07 0700) BP: (66-150)/(36-115) 112/58 mmHg (01/07 0700) SpO2:  [81 %-100 %] 100 % (01/07 0700) Weight:  [93.5 kg (206 lb 2.1 oz)] 93.5 kg (206 lb 2.1 oz) (01/07 0315) FIO2  2lpm   HEMODYNAMICS: CVP:  [5 mmHg-8 mmHg] 8 mmHg VENTILATOR SETTINGS:   INTAKE / OUTPUT: Intake/Output      01/06 0701 - 01/07 0700 01/07 0701 - 01/08 0700   I.V. (mL/kg) 3775 (40.4)    Other 2330    NG/GT 400    IV Piggyback 1550    TPN 3015    Total Intake(mL/kg) 11070 (118.4)    Urine (mL/kg/hr) 870 (0.4)    Emesis/NG output 970    Drains 315    Stool 320    Total Output 2475    Net +8595           PHYSICAL EXAMINATION: General: MAAAM NAD at rest Lungs  Decreased in bases Cor har Abd with drains and illeostomy, tna Ext Warm   LABS: Cbc  Lab 12/22/12 0920 12/20/12 0420 12/19/12 0644  WBC 7.3 -- --  HGB 10.2* 10.8* 10.3*  HCT 30.3* 32.4* 31.6*  PLT 201 223 193    Chemistry   Lab 12/23/12 0445 12/22/12 0920 12/21/12 0640 12/20/12 0420  NA 132* 130* 132* --  K 3.8 4.2 3.7 --  CL 106 95* 95* --  CO2 19 28 28  --  BUN 45* 38* 37* --    CREATININE 1.18 0.64 0.64 --  CALCIUM 8.2* 10.5 10.2 --  MG 1.3* 1.5 1.5 --  PHOS -- 4.1 4.2 4.7*  GLUCOSE 148* 164* 160* --    Liver fxn  Lab 12/22/12 0920 12/21/12 0640 12/20/12 0420  AST 66* 127* 88*  ALT 115* 157* 96*  ALKPHOS 290* 269* 270*  BILITOT 1.5* 1.9* 1.2  PROT 6.7 6.5 6.8  ALBUMIN 2.5* 2.6* 2.7*   Sepsis markers  Lab 12/23/12 0445 12/22/12 1740 12/22/12 1644 12/18/12 0445  LATICACIDVEN -- 1.5 -- --  PROCALCITON 43.31 -- 38.90 0.18  BNP  Lab 12/22/12 1700  PROBNP 33.8   CBG trend  Lab 12/23/12 0636 12/23/12 0002 12/22/12 1816 12/22/12 1212 12/22/12 0619  GLUCAP 142* 151* 141* 144* 198*    IMAGING: Ir Cholan Exist Tube  12/22/2012  *RADIOLOGY REPORT*  Clinical Data:  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  The patient is also status post prior percutaneous cholecystostomy tube placement for acalculous cholecystitis.  Assessment of the cholecystostomy tube is  performed.  1.  CHOLANGIOGRAM THROUGH PREEXISTING CATHETER 2.  PERCUTANEOUS CHOLECYSTOSTOMY TUBE EXCHANGE  Contrast:  50 ml Omnipaque-300  Fluoroscopy Time: 7.5 minutes.  Procedure:  The procedure, risks, benefits, and alternatives were explained to the patient.  Questions regarding the procedure were encouraged and answered.  The patient understands and consents to the procedure.  The preexisting percutaneous cholecystostomy tube was prepped with Betadine in a sterile fashion, and a sterile drape was applied covering the operative field.  A sterile gown and sterile gloves were used for the procedure.  The preexisting catheter was injected with contrast material under fluoroscopy.  It was then removed over a guidewire.  A 5-French catheter was then introduced over a guidewire and advanced into the lumen of the gallbladder.  Additional contrast injection was performed.  The catheter was further advanced through the cystic duct and into the common bile duct  and additional cholangiogram performed through the catheter.  The catheter was then retracted into the lumen of the gallbladder.  A new 10-French multipurpose drainage catheter was advanced over a wire and formed in the gallbladder lumen.  Final catheter position was confirmed with a fluoroscopic spot image obtained after injection of contrast.  The catheter was secured at the skin with a Prolene retention suture and Stat-Lock device.  The catheter was connected to a gravity drainage bag.  Complications:  None  Findings:  Injection of a preexisting drain shows the catheter to lie just outside the lumen of the gallbladder.  Contrast injection does partially fill the gallbladder lumen and demonstrates a significant amount of internal debris and sludge within the gallbladder.  After removal of the preexisting catheter, a percutaneous tract to the gallbladder lumen was able to be catheterized.  Cholangiogram shows very slow and sluggish flow of contrast via the cystic duct into the common bile duct.  A significant amount of debris is present extending into the cystic duct and also within the distal aspect of the common bile duct.  With a catheter further advanced into the CBD, contrast injection does show a patent common bile duct with flow of contrast noted via the ampulla into the duodenum.  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  There is a fistula demonstrated to adjacent small bowel with eventual opacification of several right sided small bowel loops demonstrating poor motility.  An additional fistula extends medially into a space adjacent to the descending duodenum.  Given demonstration of fistulas as well as poor flow of contrast from the gallbladder into the common bile duct, a new cholecystostomy tube was placed and formed at the level of the gallbladder lumen.  This will be left to gravity drainage.  IMPRESSION: The preexisting catheter  lies just outside the lumen of the gallbladder.  After recanalizing a tract to the gallbladder lumen, contrast injection demonstrates significant debris in the lumen of the gallbladder and poor flow of contrast into the common bile duct.  Debris was also present in the distal aspect of the CBD. The study also demonstrated complex fistulas communicating with the gallbladder lumen and opacifying both small bowel as well as a tract to a cavity lying adjacent to the descending duodenum.  A new 10-French catheter was formed in the gallbladder and will be left to gravity drainage.   Original Report Authenticated By: Irish Lack, M.D.    Ir Catheter Tube Change  12/22/2012  *RADIOLOGY REPORT*  Clinical Data:  Peritoneal carcinomatosis and prior bowel  surgery for colonic perforation and small bowel fistula.  The patient is also status post prior percutaneous cholecystostomy tube placement for acalculous cholecystitis.  Assessment of the cholecystostomy tube is performed.  1.  CHOLANGIOGRAM THROUGH PREEXISTING CATHETER 2.  PERCUTANEOUS CHOLECYSTOSTOMY TUBE EXCHANGE  Contrast:  50 ml Omnipaque-300  Fluoroscopy Time: 7.5 minutes.  Procedure:  The procedure, risks, benefits, and alternatives were explained to the patient.  Questions regarding the procedure were encouraged and answered.  The patient understands and consents to the procedure.  The preexisting percutaneous cholecystostomy tube was prepped with Betadine in a sterile fashion, and a sterile drape was applied covering the operative field.  A sterile gown and sterile gloves were used for the procedure.  The preexisting catheter was injected with contrast material under fluoroscopy.  It was then removed over a guidewire.  A 5-French catheter was then introduced over a guidewire and advanced into the lumen of the gallbladder.  Additional contrast injection was performed.  The catheter was further advanced through the cystic duct and into the common bile duct and  additional cholangiogram performed through the catheter.  The catheter was then retracted into the lumen of the gallbladder.  A new 10-French multipurpose drainage catheter was advanced over a wire and formed in the gallbladder lumen.  Final catheter position was confirmed with a fluoroscopic spot image obtained after injection of contrast.  The catheter was secured at the skin with a Prolene retention suture and Stat-Lock device.  The catheter was connected to a gravity drainage bag.  Complications:  None  Findings:  Injection of a preexisting drain shows the catheter to lie just outside the lumen of the gallbladder.  Contrast injection does partially fill the gallbladder lumen and demonstrates a significant amount of internal debris and sludge within the gallbladder.  After removal of the preexisting catheter, a percutaneous tract to the gallbladder lumen was able to be catheterized.  Cholangiogram shows very slow and sluggish flow of contrast via the cystic duct into the common bile duct.  A significant amount of debris is present extending into the cystic duct and also within the distal aspect of the common bile duct.  With a catheter further advanced into the CBD, contrast injection does show a patent common bile duct with flow of contrast noted via the ampulla into the duodenum.  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  There is a fistula demonstrated to adjacent small bowel with eventual opacification of several right sided small bowel loops demonstrating poor motility.  An additional fistula extends medially into a space adjacent to the descending duodenum.  Given demonstration of fistulas as well as poor flow of contrast from the gallbladder into the common bile duct, a new cholecystostomy tube was placed and formed at the level of the gallbladder lumen.  This will be left to gravity drainage.  IMPRESSION: The preexisting catheter lies  just outside the lumen of the gallbladder.  After recanalizing a tract to the gallbladder lumen, contrast injection demonstrates significant debris in the lumen of the gallbladder and poor flow of contrast into the common bile duct.  Debris was also present in the distal aspect of the CBD. The study also demonstrated complex fistulas communicating with the gallbladder lumen and opacifying both small bowel as well as a tract to a cavity lying adjacent to the descending duodenum.  A new 10-French catheter was formed in the gallbladder and will be left to gravity drainage.  Original Report Authenticated By: Irish Lack, M.D.    Dg Sinus/fist Tube Chk-non Gi  12/22/2012  *RADIOLOGY REPORT*  Clinical Data:Evaluate for fistula  ABSCESS INJECTION  Fluoroscopy Time: 1.09 minutes  Comparison: CT of the abdomen and pelvis dated 12/16/2012  Findings: Scout radiograph was obtained.  This shows a nasogastric tube within the stomach.  There is a metallic stent identified within the proximal jejunum.  JP drain overlies the left lower quadrant of the abdomen there is a percutaneous cholecystostomy tube within the right abdomen.  Left lower quadrant the JP drain was accessed and 40 ml of omni 300 was injected into the drain.  No significant extravasation of contrast material from the draining identified to suggest residual abscess or fistula formation.  Mild intra vascular extravasation with opacification of the left lower quadrant venous structures noted.  IMPRESSION: 1.  No evidence for residual abscess or fistula formation surrounding the left lower quadrant JP drain.  Findings were discussed with Dr. Michaell Cowing at the time the procedure was performed on 12/22/2012.   Original Report Authenticated By: Signa Kell, M.D.    Dg Chest Port 1 View  12/22/2012  *RADIOLOGY REPORT*  Clinical Data: Chills.  Shortness of breath.  PORTABLE CHEST - 1 VIEW  Comparison: 11/27/2012  Findings: Left Port-A-Cath and right PICC line remain in  place, unchanged.  NG tube has been placed into the stomach.  Lungs are clear.  No effusions.  Heart is normal size.  IMPRESSION: No acute cardiopulmonary disease.   Original Report Authenticated By: Charlett Nose, M.D.      ASSESSMENT / PLAN:  Sepsis likely from cholangitis after biliary drain manipulation. Hemodynamics stable. 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 Plan: Defer goals of care discussion to primary team and oncology Agree with palliative care assessment  Little for CCM to offer at this point. We will sign off. Please call if needed.  Coralyn Helling, MD [facility] [place] Pulmonary/Critical Care 12/23/2012, 9:46 AM Pager:  [phone] After 3pm call: [phone]

## 2012-12-23 NOTE — Progress Notes (Signed)
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 Percocet (Oxycodone-Acetaminophen)     hallucination   Patient Measurements: Height: 6\' 4"  (193 cm) Weight: 206 lb 2.1 oz (93.5 kg) IBW/kg (Calculated) : 86.8   Vital Signs: Temp: 97.7 F (36.5 C) (01/07 0800) Temp src: Oral (01/07 0800) BP: 112/58 mmHg (01/07 0700) Pulse Rate: 89  (01/07 0700) Intake/Output from previous day: 01/06 0701 - 01/07 0700 In: 14782 [I.V.:3775; NG/GT:400; IV Piggyback:1550; TPN:3015] Out: 2475 [Urine:870; Emesis/NG output:970; Drains:315; Stool:320]  Labs:  [facility] 12/22/12 0920  WBC 7.3  HGB 10.2*  HCT 30.3*  PLT 201  APTT --  INR --     Basename 12/23/12 0445 12/22/12 0920 12/21/12 0640  NA 132* 130* 132*  K 3.8 4.2 3.7  CL 106 95* 95*  CO2 19 28 28   GLUCOSE 148* 164* 160*  BUN 45* 38* 37*  CREATININE 1.18 0.64 0.64  LABCREA -- -- --  CREAT24HRUR -- -- --  CALCIUM 8.2* 10.5 10.2  MG 1.3* 1.5 1.5  PHOS -- 4.1 4.2  PROT -- 6.7 6.5  ALBUMIN -- 2.5* 2.6*  AST -- 66* 127*  ALT -- 115* 157*  ALKPHOS -- 290* 269*  BILITOT -- 1.5* 1.9*  BILIDIR -- -- --  IBILI -- -- --  PREALBUMIN -- -- --  TRIG -- -- --  CHOLHDL -- -- --  CHOL -- 115 --   Estimated Creatinine Clearance: 85.8 ml/min (by C-G formula based on Cr of 1.18).   Medications:  Scheduled:     . antiseptic oral rinse  15 mL Mouth Rinse BID  . Chlorhexidine Gluconate Cloth  6 each Topical Q0600  . enoxaparin (LOVENOX) injection  40 mg Subcutaneous Q24H  . [COMPLETED] ibuprofen (CALDOLOR) IV  400 mg Intravenous Once  . insulin aspart  0-9 Units Subcutaneous Q6H  . [EXPIRED] lidocaine      . lip balm  1 application Topical BID  . [COMPLETED] magnesium sulfate 1 - 4 g bolus IVPB  2 g Intravenous Once  . metoCLOPramide  10 mg Intravenous QID  . mupirocin ointment  1 application Nasal BID  . pantoprazole   40 mg Intravenous Q12H  . piperacillin-tazobactam (ZOSYN)  IV  3.375 g Intravenous Q8H  . [COMPLETED] sodium chloride  1,000 mL Intravenous Once  . [EXPIRED] sodium chloride  1,000 mL Intravenous Q30 min  . [COMPLETED] sodium chloride  1,000 mL Intravenous Once  . [COMPLETED] sodium chloride  1,000 mL Intravenous Once  . [COMPLETED] sodium chloride  500 mL Intravenous Once  . [COMPLETED] sodium chloride  500 mL Intravenous Once  . [COMPLETED] sodium chloride  500 mL Intravenous Once  . [COMPLETED] sodium chloride  500 mL Intravenous Once  . [COMPLETED] sodium chloride  500 mL Intravenous Once  . vancomycin  1,000 mg Intravenous Q8H  . [DISCONTINUED] allopurinol  100 mg Oral Daily  . [DISCONTINUED] cloNIDine  0.2 mg Transdermal Weekly  . [DISCONTINUED] gabapentin  300 mg Oral TID   Infusions:     . [EXPIRED] sodium chloride    . sodium chloride 125 mL/hr (12/23/12 0321)  . TPN (CLINIMIX) +/- additives 115 mL/hr at 12/22/12 1813   And  . fat emulsion 250 mL (12/22/12 1900)  . [EXPIRED] TPN (CLINIMIX) +/- additives 115 mL/hr at 12/21/12 1712   Insulin Requirements in the past 24 hours:  CBG 141-151 3 units Novolog SSI 30 units regular  insulin in TPN bag Per [facility] med list, patient on SSI and regular insulin in TPN (44.6 units)  Current Nutrition:  Goal rate:  Clinimix E5/20 at 120ml/hr + Lipids MWF to provide Protein 138gm and 2909 kcal MWF, 2429 Kcal TTSS for avg of 2634 kcal TPN PTA provide 2624 Kcals, 140g Protein, fluid (125 ml/hr over 24 hrs) NPO  Assessment:  56 YOM with complex history including terminal peritoneal carcinomatosis s/p chemo.  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  Pt with LLQ drain, LUQ drain, ileostomy, choly drain, NG tube.    Pt transferred to [facility] on 12/20/11 from [facility] for SBO management, N/V, high NG output.  No further plans for chemotherapy or surgery at this time.  Pt developed  hypotension and tachycardia and transferred to ICU on 1/6 with possible sepsis (cholangitis after biliary drain manipulation)  Labs: Na=132 (low but stable), Phos 4.1, Mg = 1.3 (Mag 2g already given), Corrected Ca 9.4 Renal:  SCr significantly increased SCr 0.64 --> 1.18 LFTs: AST/ALT elevated, Tbili = 1.5 CBGs:  CBGs near goal of 150 mg/dl IVF: NS at 161 ml/hr GI PPx: Protonix  Chol/Trigs: 94/66 (1/4) Prealbumin: 29.8 (1/4)  Nutritional Goals:  RD recs (12/21/12):  Kcal: 2600-2800, Protein: 130-140 gm, Fluid: >2.6L/day Continuing approx same formula as LTACH  Plan:   Due to concern for sepsis/ARF, decrease to Clinimix E 5/20 at 83 ml/hr.   IV Lipids MWF d/t backorder.    Decrease insulin to 20 units/bag and monitor CBGs   Standard multivitamins and trace elements (MWF only due to ongoing shortage).  IVF per primary Md.  TNA lab panels on Mondays & Thursdays.  F/u daily.   Lynann Beaver PharmD, BCPS Pager [phone] 12/23/2012 10:26 AM

## 2012-12-24 DIAGNOSIS — A4101 Sepsis due to Methicillin susceptible Staphylococcus aureus: Secondary | ICD-10-CM

## 2012-12-24 DIAGNOSIS — E876 Hypokalemia: Secondary | ICD-10-CM

## 2012-12-24 DIAGNOSIS — D61818 Other pancytopenia: Secondary | ICD-10-CM

## 2012-12-24 DIAGNOSIS — R6520 Severe sepsis without septic shock: Secondary | ICD-10-CM

## 2012-12-24 DIAGNOSIS — R6521 Severe sepsis with septic shock: Secondary | ICD-10-CM

## 2012-12-24 DIAGNOSIS — A419 Sepsis, unspecified organism: Secondary | ICD-10-CM

## 2012-12-24 DIAGNOSIS — Z515 Encounter for palliative care: Secondary | ICD-10-CM

## 2012-12-24 HISTORY — DX: Sepsis due to methicillin susceptible Staphylococcus aureus: A41.01

## 2012-12-24 HISTORY — DX: Severe sepsis with septic shock: R65.21

## 2012-12-24 LAB — BASIC METABOLIC PANEL
CO2: 20 mEq/L (ref 19–32)
Chloride: 106 mEq/L (ref 96–112)
GFR calc Af Amer: >90 mL/min (ref >90)
Potassium: 3.1 mEq/L — ABNORMAL LOW (ref 3.5–5.1)

## 2012-12-24 LAB — GLUCOSE, CAPILLARY: Glucose-Capillary: 139 mg/dL — ABNORMAL HIGH (ref 70–99)

## 2012-12-24 LAB — CBC
HCT: 20.7 % — ABNORMAL LOW (ref 39.0–52.0)
HCT: 23.2 % — ABNORMAL LOW (ref 39.0–52.0)
Hemoglobin: 7.8 g/dL — ABNORMAL LOW (ref 13.0–17.0)
MCV: 83.8 fL (ref 78.0–100.0)
Platelets: 108 10*3/uL — ABNORMAL LOW (ref 150–400)
RBC: 2.45 MIL/uL — ABNORMAL LOW (ref 4.22–5.81)
RBC: 2.77 MIL/uL — ABNORMAL LOW (ref 4.22–5.81)
RDW: 15.7 % — ABNORMAL HIGH (ref 11.5–15.5)
WBC: 3.8 10*3/uL — ABNORMAL LOW (ref 4.0–10.5)
WBC: 4.6 10*3/uL (ref 4.0–10.5)

## 2012-12-24 LAB — PROCALCITONIN: Procalcitonin: 26.47 ng/mL

## 2012-12-24 LAB — MAGNESIUM: Magnesium: 1.5 mg/dL (ref 1.5–2.5)

## 2012-12-24 MED ORDER — POTASSIUM CHLORIDE 10 MEQ/50ML IV SOLN
10.0000 meq | INTRAVENOUS | Status: AC
  Administered 2012-12-24 (×6): 10 meq via INTRAVENOUS
  Filled 2012-12-24: qty 50
  Filled 2012-12-24: qty 100
  Filled 2012-12-24 (×3): qty 50

## 2012-12-24 MED ORDER — FAT EMULSION 20 % IV EMUL
250.0000 mL | INTRAVENOUS | Status: AC
  Administered 2012-12-24: 250 mL via INTRAVENOUS
  Filled 2012-12-24: qty 250

## 2012-12-24 MED ORDER — VANCOMYCIN HCL IN DEXTROSE 1-5 GM/200ML-% IV SOLN
1000.0000 mg | Freq: Three times a day (TID) | INTRAVENOUS | Status: DC
  Administered 2012-12-24 – 2012-12-28 (×13): 1000 mg via INTRAVENOUS
  Filled 2012-12-24 (×15): qty 200

## 2012-12-24 MED ORDER — TRACE MINERALS CR-CU-F-FE-I-MN-MO-SE-ZN IV SOLN
INTRAVENOUS | Status: AC
  Administered 2012-12-24: 17:00:00 via INTRAVENOUS
  Filled 2012-12-24: qty 2760

## 2012-12-24 NOTE — Progress Notes (Signed)
ANTIBIOTIC CONSULT NOTE - FOLLOW UP  Pharmacy Consult for Vancomycin, Zosyn Indication: r/o sepsis  Allergies  Allergen Reactions  . Percocet (Oxycodone-Acetaminophen)     hallucination    Patient Measurements: Height: 6\' 4"  (193 cm) Weight: 204 lb 5.9 oz (92.7 kg) IBW/kg (Calculated) : 86.8   Vital Signs: Temp: 99.3 F (37.4 C) (01/08 0000) Temp src: Oral (01/08 0000) BP: 115/61 mmHg (01/08 0800) Pulse Rate: 71  (01/08 0800) Intake/Output from previous day: 01/07 0701 - 01/08 0700 In: 4605.8 [I.V.:1900; IV Piggyback:382.5; TPN:2323.3] Out: 4560 [Urine:2550; Emesis/NG output:1600; Drains:360; Stool:50]  Labs:  Basename 12/24/12 0400 12/23/12 0445 12/22/12 0920  WBC 3.8* -- 7.3  HGB 7.0* -- 10.2*  PLT 108* -- 201  LABCREA -- -- --  CREATININE 0.79 1.18 0.64   Estimated Creatinine Clearance: 126.6 ml/min (by C-G formula based on Cr of 0.79). No results found for this basename: VANCOTROUGH:2,VANCOPEAK:2,VANCORANDOM:2,GENTTROUGH:2,GENTPEAK:2,GENTRANDOM:2,TOBRATROUGH:2,TOBRAPEAK:2,TOBRARND:2,AMIKACINPEAK:2,AMIKACINTROU:2,AMIKACIN:2, in the last 72 hours   Microbiology: Recent Results (from the past 720 hour(s))  CULTURE, BLOOD (ROUTINE X 2)     Status: Normal (Preliminary result)   Collection Time   12/22/12 12:55 PM      Component Value Range Status Comment   Specimen Description BLOOD LEFT HAND   Final    Special Requests BOTTLES DRAWN AEROBIC AND ANAEROBIC [facility]   Final    Culture  Setup Time 12/22/2012 22:48   Final    Culture     Final    Value: GRAM POSITIVE COCCI IN CLUSTERS     Note: Gram Stain Report Called to,Read Back By and Verified With: Burnell Blanks @ 1254 12/23/12 BY KRAWS   Report Status PENDING   Incomplete   CULTURE, BLOOD (ROUTINE X 2)     Status: Normal (Preliminary result)   Collection Time   12/22/12  1:05 PM      Component Value Range Status Comment   Specimen Description BLOOD LEFT HAND   Final    Special Requests BOTTLES DRAWN AEROBIC AND ANAEROBIC [facility]    Final    Culture  Setup Time 12/22/2012 22:48   Final    Culture     Final    Value: GRAM POSITIVE COCCI IN CLUSTERS     Note: Gram Stain Report Called to,Read Back By and Verified With: Burnell Blanks @ 1254 12/23/12 BY KRAWS   Report Status PENDING   Incomplete   MRSA PCR SCREENING     Status: Abnormal   Collection Time   12/22/12  5:42 PM      Component Value Range Status Comment   MRSA by PCR POSITIVE (*) NEGATIVE Final     Anti-infectives     Start     Dose/Rate Route Frequency Ordered Stop   12/23/12 2200   vancomycin (VANCOCIN) IVPB 1000 mg/200 mL premix        1,000 mg 200 mL/hr over 60 Minutes Intravenous Every 12 hours 12/23/12 1040     12/22/12 1600   vancomycin (VANCOCIN) IVPB 1000 mg/200 mL premix  Status:  Discontinued        1,000 mg 200 mL/hr over 60 Minutes Intravenous Every 8 hours 12/22/12 1539 12/23/12 1040   12/22/12 1600  piperacillin-tazobactam (ZOSYN) IVPB 3.375 g       3.375 g 12.5 mL/hr over 240 Minutes Intravenous Every 8 hours 12/22/12 1539            Assessment:  56 YOM with h/o terminal peritoneal carcinomatosis s/p chemo. Pt developed hypotension and tachycardia 1/6 with possible  sepsis (cholangitis after biliary drain manipulation)  12/23/12 Bld Cxt= Staph Aureus in 2/2 sets (sensitivity pending, ID aware)  Day #3 Vancomycin and Zosyn, doses empirically adjusted yesterday for SCr increase.  SCr improved today (0.64 --> 1.18 --> 0.79)  Vancomycin trough level (11.8) below desired goal range  Goal of Therapy:  Vancomycin trough level 15-20 mcg/ml Appropriate abx dosing, eradication of infection.  Plan:   Continue Zosyn 3.375g IV Q8H infused over 4hrs.  Increase to vancomycin to 1g IV q8h  Measure Vanc trough at steady state.  Follow up renal fxn and culture results.   Lynann Beaver PharmD, BCPS Pager [phone] 12/24/2012 10:45 AM

## 2012-12-24 NOTE — Progress Notes (Signed)
[facility] PROGRESS NOTE  [name]:[id number] DOB: June 09, 1956 DOA: 12/19/2012 PCP: [name], MD  Assessment/Plan: 1. Septic Shock secondary to MRSA bacteremia-? Line sepsis (has PICC line and Port-A-Cath): Less likely from Cholangitis. Patient was transferred to ICU a couple of days ago for hypotension and tachycardia. He was empirically placed on vancomycin and Zosyn. His blood pressure stabilized without pressors. Critical care medicine consulted and signed off. His blood cultures shows MRSA in 2 bottles. If aggressive care planned, PICC line and Port-A-Cath will need to be removed, line holiday, surveillance blood cultures to be drawn, and prolonged IV antibiotics. Patient is n.p.o. and getting TNA. This was discussed at length with patient who wants to discuss this with the rest of his family when he meets with the palliative care team before proceeding any further. Explained to him the risk of endocarditis. Will await palliative care meeting. Currently stable. DC Zosyn as discussed with ID. Discussed with Dr. Luciana Axe, Dr. [name] and [name].  2. Pancytopenia: New leukopenia and thrombocytopenia. Worsened anemia. No bleeding. We'll repeat CBC stat to verify. 3. Hypokalemia: Replete IV per pharmacy. Follow BMP. 4. 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 Patient has had multiple surgical procedures (refer to surgeon's notes). Currently Surgery and GI does not have any further interventions to offer. No further interventions planned. Awaiting palliative care meeting. 5. Small bowel obstruction: Currently conservative management-n.p.o., NG tube and TNA. GI, IR and general surgery R. consulting-do not see a role for any further aggressive intervention. 6. Anemia: Stable 7. Cholecystitis status post cholecystostomy drain placed at [facility] - IR performed  cholecystostomy tube change on 1/6. 8. Hypomagnesemia: adjust in TNA per pharmacy.  Code Status: Full Family Communication: Discussed with patient. Disposition Plan: Continue Management in ICU until outcome of palliative care meeting.   Consultants:  Oncology    GI   IR  General surgery.  Critical care medicine.  Palliative care medicine  Procedures:  NG tube  Change of cholecystostomy tube by IR on 1/6  Antibiotics:  IV vancomycin 1/6 >  IV Zosyn 1/6 > 1/8  HPI/Subjective: Feels better and denies complaints.  Objective: Filed Vitals:   12/24/12 1100 12/24/12 1200 12/24/12 1300 12/24/12 1400  BP:  108/62  107/69  Pulse: 73 74 73 72  Temp:      TempSrc:      Resp: 27 25 24 31   Height:      Weight:      SpO2: 100% 100% 100% 100%    Intake/Output Summary (Last 24 hours) at 12/24/12 1421 Last data filed at 12/24/12 1400  Gross per 24 hour  Intake 4764.27 ml  Output   4985 ml  Net -220.73 ml   Filed Weights   12/19/12 1646 12/23/12 0315 12/24/12 0400  Weight: 92.534 kg (204 lb) 93.5 kg (206 lb 2.1 oz) 92.7 kg (204 lb 5.9 oz)    Exam:   General exam: Comfortable.   Respiratory system: Clear to auscultation. No increased work of breathing.  Cardiovascular system: First and second heart sounds heard, regular rate and rhythm. No JVD, murmurs or pedal edema. Telemetry shows sinus rhythm  Gastroenterology: Abdomen is nondistended, soft and nontender. RLQ ostomy draining cloudy liquid with sediments. RUQ biliary/gallbladder drain with minimal output. Upper midline Eakin's  pouch with minimal fluid.  Central nervous system: Alert and oriented. No focal neurological deficits.  Extremities: Symmetric 5 x 5 power.  Data Reviewed: Basic Metabolic Panel:  Lab 12/24/12 1610 12/23/12 0445 12/22/12 0920 12/21/12 0640 12/20/12 0420 12/18/12 0445  NA 134* 132* 130* 132* 132* --  K 3.1* 3.8 4.2 3.7 4.5 --  CL 106 106 95* 95* 94* --  CO2 20 19 28 28  29  --  GLUCOSE 123* 148* 164* 160* 155* --  BUN 23 45* 38* 37* 38* --  CREATININE 0.79 1.18 0.64 0.64 0.60 --  CALCIUM 9.0 8.2* 10.5 10.2 10.1 --  MG 1.5 1.3* 1.5 1.5 1.6 --  PHOS 3.3 -- 4.1 4.2 4.7* 3.3   Liver Function Tests:  Lab 12/22/12 0920 12/21/12 0640 12/20/12 0420 12/18/12 0445  AST 66* 127* 88* 40*  ALT 115* 157* 96* 33  ALKPHOS 290* 269* 270* 256*  BILITOT 1.5* 1.9* 1.2 0.5  PROT 6.7 6.5 6.8 6.6  ALBUMIN 2.5* 2.6* 2.7* 2.6*   No results found for this basename: LIPASE:5,AMYLASE:5 in the last 168 hours No results found for this basename: AMMONIA:5 in the last 168 hours CBC:  Lab 12/24/12 0400 12/22/12 0920 12/20/12 0420 12/19/12 0644 12/19/12 0355  WBC 3.8* 7.3 6.0 5.7 6.3  NEUTROABS -- 4.3 3.5 -- --  HGB 7.0* 10.2* 10.8* 10.3* 9.2*  HCT 20.7* 30.3* 32.4* 31.6* 30.9*  MCV 84.5 83.7 84.4 85.9 94.5  PLT 108* 201 223 193 195   Cardiac Enzymes: No results found for this basename: CKTOTAL:5,CKMB:5,CKMBINDEX:5,TROPONINI:5 in the last 168 hours BNP (last 3 results)  Basename 12/22/12 1700  PROBNP 33.8   CBG:  Lab 12/24/12 1136 12/24/12 0609 12/24/12 0022 12/23/12 1755 12/23/12 1217  GLUCAP 159* 118* 139* 142* 134*    Recent Results (from the past 240 hour(s))  CULTURE, BLOOD (ROUTINE X 2)     Status: Normal (Preliminary result)   Collection Time   12/22/12 12:55 PM      Component Value Range Status Comment   Specimen Description BLOOD LEFT HAND   Final    Special Requests BOTTLES DRAWN AEROBIC AND ANAEROBIC [facility]   Final    Culture  Setup Time 12/22/2012 22:48   Final    Culture     Final    Value: METHICILLIN RESISTANT STAPHYLOCOCCUS AUREUS     Note: RIFAMPIN AND GENTAMICIN SHOULD NOT BE USED AS SINGLE DRUGS FOR TREATMENT OF STAPH INFECTIONS. CRITICAL RESULT CALLED TO, READ BACK BY AND VERIFIED WITH: JENNIFER FUQUAY @ 1258 12/24/12 BY KRAWS     Note: Gram Stain Report Called to,Read Back By and Verified With: Burnell Blanks @ 1254 12/23/12 BY KRAWS   Report Status  PENDING   Incomplete   CULTURE, BLOOD (ROUTINE X 2)     Status: Normal (Preliminary result)   Collection Time   12/22/12  1:05 PM      Component Value Range Status Comment   Specimen Description BLOOD LEFT HAND   Final    Special Requests BOTTLES DRAWN AEROBIC AND ANAEROBIC [facility]   Final    Culture  Setup Time 12/22/2012 22:48   Final    Culture     Final    Value: STAPHYLOCOCCUS AUREUS     Note: Gram Stain Report Called to,Read Back By and Verified With: Burnell Blanks @ 1254 12/23/12 BY KRAWS   Report Status PENDING   Incomplete   MRSA PCR SCREENING     Status: Abnormal   Collection Time   12/22/12  5:42 PM      Component Value Range Status Comment   MRSA by PCR POSITIVE (*) NEGATIVE  Final      Studies: Ir Cholan Exist Tube  12/22/2012  *RADIOLOGY REPORT*  Clinical Data:  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  The patient is also status post prior percutaneous cholecystostomy tube placement for acalculous cholecystitis.  Assessment of the cholecystostomy tube is performed.  1.  CHOLANGIOGRAM THROUGH PREEXISTING CATHETER 2.  PERCUTANEOUS CHOLECYSTOSTOMY TUBE EXCHANGE  Contrast:  50 ml Omnipaque-300  Fluoroscopy Time: 7.5 minutes.  Procedure:  The procedure, risks, benefits, and alternatives were explained to the patient.  Questions regarding the procedure were encouraged and answered.  The patient understands and consents to the procedure.  The preexisting percutaneous cholecystostomy tube was prepped with Betadine in a sterile fashion, and a sterile drape was applied covering the operative field.  A sterile gown and sterile gloves were used for the procedure.  The preexisting catheter was injected with contrast material under fluoroscopy.  It was then removed over a guidewire.  A 5-French catheter was then introduced over a guidewire and advanced into the lumen of the gallbladder.  Additional contrast injection was performed.  The catheter was  further advanced through the cystic duct and into the common bile duct and additional cholangiogram performed through the catheter.  The catheter was then retracted into the lumen of the gallbladder.  A new 10-French multipurpose drainage catheter was advanced over a wire and formed in the gallbladder lumen.  Final catheter position was confirmed with a fluoroscopic spot image obtained after injection of contrast.  The catheter was secured at the skin with a Prolene retention suture and Stat-Lock device.  The catheter was connected to a gravity drainage bag.  Complications:  None  Findings:  Injection of a preexisting drain shows the catheter to lie just outside the lumen of the gallbladder.  Contrast injection does partially fill the gallbladder lumen and demonstrates a significant amount of internal debris and sludge within the gallbladder.  After removal of the preexisting catheter, a percutaneous tract to the gallbladder lumen was able to be catheterized.  Cholangiogram shows very slow and sluggish flow of contrast via the cystic duct into the common bile duct.  A significant amount of debris is present extending into the cystic duct and also within the distal aspect of the common bile duct.  With a catheter further advanced into the CBD, contrast injection does show a patent common bile duct with flow of contrast noted via the ampulla into the duodenum.  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  There is a fistula demonstrated to adjacent small bowel with eventual opacification of several right sided small bowel loops demonstrating poor motility.  An additional fistula extends medially into a space adjacent to the descending duodenum.  Given demonstration of fistulas as well as poor flow of contrast from the gallbladder into the common bile duct, a new cholecystostomy tube was placed and formed at the level of the gallbladder lumen.  This  will be left to gravity drainage.  IMPRESSION: The preexisting catheter lies just outside the lumen of the gallbladder.  After recanalizing a tract to the gallbladder lumen, contrast injection demonstrates significant debris in the lumen of the gallbladder and poor flow of contrast into the common bile duct.  Debris was also present in the distal aspect of the CBD. The study also demonstrated complex fistulas communicating with the gallbladder lumen and opacifying both small bowel as well as a tract to  a cavity lying adjacent to the descending duodenum.  A new 10-French catheter was formed in the gallbladder and will be left to gravity drainage.   Original Report Authenticated By: Irish Lack, M.D.    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  The patient is also status post prior percutaneous cholecystostomy tube placement for acalculous cholecystitis.  Assessment of the cholecystostomy tube is performed.  1.  CHOLANGIOGRAM THROUGH PREEXISTING CATHETER 2.  PERCUTANEOUS CHOLECYSTOSTOMY TUBE EXCHANGE  Contrast:  50 ml Omnipaque-300  Fluoroscopy Time: 7.5 minutes.  Procedure:  The procedure, risks, benefits, and alternatives were explained to the patient.  Questions regarding the procedure were encouraged and answered.  The patient understands and consents to the procedure.  The preexisting percutaneous cholecystostomy tube was prepped with Betadine in a sterile fashion, and a sterile drape was applied covering the operative field.  A sterile gown and sterile gloves were used for the procedure.  The preexisting catheter was injected with contrast material under fluoroscopy.  It was then removed over a guidewire.  A 5-French catheter was then introduced over a guidewire and advanced into the lumen of the gallbladder.  Additional contrast injection was performed.  The catheter was  further advanced through the cystic duct and into the common bile duct and additional cholangiogram performed through the catheter.  The catheter was then retracted into the lumen of the gallbladder.  A new 10-French multipurpose drainage catheter was advanced over a wire and formed in the gallbladder lumen.  Final catheter position was confirmed with a fluoroscopic spot image obtained after injection of contrast.  The catheter was secured at the skin with a Prolene retention suture and Stat-Lock device.  The catheter was connected to a gravity drainage bag.  Complications:  None  Findings:  Injection of a preexisting drain shows the catheter to lie just outside the lumen of the gallbladder.  Contrast injection does partially fill the gallbladder lumen and demonstrates a significant amount of internal debris and sludge within the gallbladder.  After removal of the preexisting catheter, a percutaneous tract to the gallbladder lumen was able to be catheterized.  Cholangiogram shows very slow and sluggish flow of contrast via the cystic duct into the common bile duct.  A significant amount of debris is present extending into the cystic duct and also within the distal aspect of the common bile duct.  With a catheter further advanced into the CBD, contrast injection does show a patent common bile duct with flow of contrast noted via the ampulla into the duodenum.  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  There is a fistula demonstrated to adjacent small bowel with eventual opacification of several right sided small bowel loops demonstrating poor motility.  An additional fistula extends medially into a space adjacent to the descending duodenum.  Given demonstration of fistulas as well as poor flow of contrast from the gallbladder into the common bile duct, a new cholecystostomy tube was placed and formed at the level of the gallbladder lumen.  This  will be left to gravity drainage.  IMPRESSION: The preexisting catheter lies just outside the lumen of the gallbladder.  After recanalizing a tract to the gallbladder lumen, contrast injection demonstrates significant debris in the lumen of the gallbladder and poor flow of contrast into the common bile duct.  Debris was also  present in the distal aspect of the CBD. The study also demonstrated complex fistulas communicating with the gallbladder lumen and opacifying both small bowel as well as a tract to a cavity lying adjacent to the descending duodenum.  A new 10-French catheter was formed in the gallbladder and will be left to gravity drainage.   Original Report Authenticated By: Irish Lack, M.D.     Scheduled Meds:    . antiseptic oral rinse  15 mL Mouth Rinse BID  . Chlorhexidine Gluconate Cloth  6 each Topical Q0600  . enoxaparin (LOVENOX) injection  40 mg Subcutaneous Q24H  . insulin aspart  0-9 Units Subcutaneous Q6H  . lip balm  1 application Topical BID  . metoCLOPramide  10 mg Intravenous QID  . mupirocin ointment  1 application Nasal BID  . pantoprazole  40 mg Intravenous Q12H  . piperacillin-tazobactam (ZOSYN)  IV  3.375 g Intravenous Q8H  . vancomycin  1,000 mg Intravenous Q8H   Continuous Infusions:    . sodium chloride 100 mL/hr at 12/23/12 2304  . TPN (CLINIMIX) +/- additives     And  . fat emulsion    . TPN (CLINIMIX) +/- additives 83 mL/hr at 12/23/12 1708    Principal Problem:  *Peritoneal carcinomatosis Active Problems:  ANEMIA  SBO (small bowel obstruction)  Nausea and vomiting in adult  Anemia of chronic disease  Colo-enteric fistula  Acute cholecystitis s/p perc draianage [id number]  Fistula of small intestine  Shock  Sepsis     [facility]  [facility] Pager [phone]. If 8PM-8AM, please contact night-coverage at [email or web address], password [facility] 12/24/2012, 2:21 PM  LOS: 5 days

## 2012-12-24 NOTE — Progress Notes (Signed)
Patient ZO:XWRUEAV ARCADIO COPE      DOB: December 11, 1956      WUJ:[id number]  Summary of Goals of Care , Full consult to follow:  Met with patient, spouse , multiple sons, multiple brothers and sisters with nieces total present 12 family members.  Patient was ale to express himself stating he was having difficulty with all of the negativity surrounding his current situation.  He states he knows himself and that he feels better than he has in a long time.  He states he would like to continue to fight his disease process .  His goal is to improve his performance status so that he could have chemo again.  He relates that he knows that the cancer can not be cured but that he wants to pursue each option that would give him quality and quantity for now.  We talked honestly about come to a point where the options for cure have been exhausted and at this time he does not buy into that concept .  He would like to coordinate conversation with Dr. [name] and even consider transition to [facility] is needed to reach his goals for rehab.  We are unable to get past the mistrust of current information to move forward into an acceptance mode for this patient and his family.  I have offered to communicate with Dr. [name] in the am regarding existing options and help him transition to that location for further care.  At this time he is expecting full curative care for his infection, he desires full code and we were not even able to touch that topic, he would like to try to clamp his NG and try some oral intake at some point soon. He desires to continue to rehab .  I will communicate with the team late morning tomorrow regarding these goals of care as I am scheduled for meetings up until 10 am.   Mavryk Pino L. [name], MD MBA The Palliative Medicine Team at [facility] Phone: [phone] Pager: [phone]

## 2012-12-24 NOTE — Progress Notes (Deleted)
CRITICAL VALUE ALERT  Critical value received:  CO2 9  Date of notification:  12/24/12  Time of notification:  0100   Critical value read back:yes  Nurse who received alert:  Carlos Levering   MD notified (1st page):  Hanley Hays, NP  Time of first page:  0145   MD notified (2nd page):  Time of second page:  Responding MD:  Hanley Hays  Time MD responded:  [phone]

## 2012-12-24 NOTE — Progress Notes (Signed)
Patient OZ:HYQMVHQ TOMMASO CAVITT      DOB: 1956/08/19      ION:[id number]  Met with Mr. Reiswig this AM explained how we assist his Drs in caring for him and how we can assist he and his family on moving forward with the goals that they have for his care.  He is open to assistance but wants his family to be present.  I have given him our card and he will speak with his family and call us to schedule a time.  Keirstyn Aydt L. [name], MD MBA The Palliative Medicine Team at [facility] Phone: [phone] Pager: [phone]

## 2012-12-24 NOTE — Progress Notes (Signed)
Patient and family have asked that chaplains not visit them. They will take spiritual care volunteers if they are available. Our volunteer is only here Wednesday mornings. She visited with the family for 45 minutes this morning and was able to hear patient's concerns and support family in reducing some of their anxiety.

## 2012-12-24 NOTE — Consult Note (Signed)
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  Specific aspects of clinical management are critical to optimizing the outcome of patients with SAB.  Therefore, the [facility]imicrobial Management Team ([facility]) have initiated an intervention aimed at improving the management of SAB at [facility].  To do so, Infectious Diseases Consultants are providing evidence-based recommendations for the management of all patients with SAB.    I have discussed the case with the primary attending Dr. Bennie Pierini and reviewed the record.  At this time, his prognosis is poor.  Palliative care is involved and going to have a continuing discussion with the patient regarding goals of care - aggressive interventions or comfort.   If aggressive interventions requested, from an ID standpoint, this will require removal of PICC line and line holiday, repeat blood cultures and a long course of IV antibiotics for Staph aureus (4-6 weeks).   I will follow along and await input from the primary team/palliative care.    Thanks  Staci Righter, MD

## 2012-12-24 NOTE — Progress Notes (Signed)
IP PROGRESS NOTE  Subjective:   Patient report feeling better compared to yesterday.  No complaints.   Objective:  Vital signs in last 24 hours: Temp:  [97.7 F (36.5 C)-99.9 F (37.7 C)] 99.3 F (37.4 C) (01/08 0000) Pulse Rate:  [33-98] 38  (01/08 0600) Resp:  [22-38] 27  (01/08 0600) BP: (93-114)/(48-67) 103/56 mmHg (01/08 0600) SpO2:  [82 %-100 %] 82 % (01/08 0600) Weight:  [204 lb 5.9 oz (92.7 kg)] 204 lb 5.9 oz (92.7 kg) (01/08 0400) Weight change: -1 lb 12.2 oz (-0.8 kg) Last BM Date: 12/23/12  Intake/Output from previous day: 01/07 0701 - 01/08 0700 In: 4605.8 [I.V.:1900; IV Piggyback:382.5; TPN:2323.3] Out: 4560 [Urine:2550; Emesis/NG output:1600; Drains:360; Stool:50]  Mouth: mucous membranes moist, pharynx normal without lesions Resp: clear to auscultation bilaterally Cardio: regular rate and rhythm, S1, S2 normal, no murmur, click, rub or gallop GI: soft, non-tender; bowel sounds normal; no masses,  no organomegaly Extremities: extremities normal, atraumatic, no cyanosis or edema    Lab Results:  Basename 12/24/12 0400 12/22/12 0920  WBC 3.8* 7.3  HGB 7.0* 10.2*  HCT 20.7* 30.3*  PLT 108* 201    BMET  Basename 12/24/12 0400 12/23/12 0445  NA 134* 132*  K 3.1* 3.8  CL 106 106  CO2 20 19  GLUCOSE 123* 148*  BUN 23 45*  CREATININE 0.79 1.18  CALCIUM 9.0 8.2*    Medications: I have reviewed the patient's current medications.  Assessment/Plan:  This is a pleasant 57 year old gentleman with the  following issues:  1. 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  He is S/P extensive debulking surgery twice (1st time January 2013 second time 08/2012) . The second time the patient developed postoperative complications and required reintervention and JP drains.  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  He is getting TNA for nutrient.  Palliative care  assisting in his goals of care at this point.  No treatments from my stand point.   2. SBO (small bowel obstruction): on TNA and NJ tube. Management per GI, surgery and primary team.   3. Sepsis: Likely from biliary drain, etc.. He is broad spectrum antibiotics and supportive care. Improved.       LOS: 5 days   [place] 12/24/2012, 7:48 AM

## 2012-12-24 NOTE — Progress Notes (Signed)
Patient ZO:XWRUEAV [name]      DOB: 1956-09-07      WUJ:[id number]  Patient's son called and requested GOC this evening.  Plan for between 6-630 pm today.  Dr. Waymon Amato updated.  Adalida Garver L. [name], MD MBA The Palliative Medicine Team at [facility] Phone: [phone] Pager: [phone]

## 2012-12-24 NOTE — Progress Notes (Signed)
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 Percocet (Oxycodone-Acetaminophen)     hallucination   Patient Measurements: Height: 6\' 4"  (193 cm) Weight: 204 lb 5.9 oz (92.7 kg) IBW/kg (Calculated) : 86.8   Vital Signs: Temp: 99.3 F (37.4 C) (01/08 0000) Temp src: Oral (01/08 0000) BP: 103/56 mmHg (01/08 0600) Pulse Rate: 38  (01/08 0600) Intake/Output from previous day: 01/07 0701 - 01/08 0700 In: 4605.8 [I.V.:1900; IV Piggyback:382.5; TPN:2323.3] Out: 4560 [Urine:2550; Emesis/NG output:1600; Drains:360; Stool:50]  Labs:  [facility] 12/24/12 0400 12/22/12 0920  WBC 3.8* 7.3  HGB 7.0* 10.2*  HCT 20.7* 30.3*  PLT 108* 201  APTT -- --  INR -- --     Basename 12/24/12 0400 12/23/12 0445 12/22/12 0920  NA 134* 132* 130*  K 3.1* 3.8 4.2  CL 106 106 95*  CO2 20 19 28   GLUCOSE 123* 148* 164*  BUN 23 45* 38*  CREATININE 0.79 1.18 0.64  LABCREA -- -- --  CREAT24HRUR -- -- --  CALCIUM 9.0 8.2* 10.5  MG 1.5 1.3* 1.5  PHOS 3.3 -- 4.1  PROT -- -- 6.7  ALBUMIN -- -- 2.5*  AST -- -- 66*  ALT -- -- 115*  ALKPHOS -- -- 290*  BILITOT -- -- 1.5*  BILIDIR -- -- --  IBILI -- -- --  PREALBUMIN -- -- --  TRIG -- -- --  CHOLHDL -- -- --  CHOL -- -- 115   Estimated Creatinine Clearance: 126.6 ml/min (by C-G formula based on Cr of 0.79).   Medications:  Scheduled:     . antiseptic oral rinse  15 mL Mouth Rinse BID  . Chlorhexidine Gluconate Cloth  6 each Topical Q0600  . enoxaparin (LOVENOX) injection  40 mg Subcutaneous Q24H  . insulin aspart  0-9 Units Subcutaneous Q6H  . lip balm  1 application Topical BID  . metoCLOPramide  10 mg Intravenous QID  . mupirocin ointment  1 application Nasal BID  . pantoprazole  40 mg Intravenous Q12H  . piperacillin-tazobactam (ZOSYN)  IV  3.375 g Intravenous Q8H  . potassium chloride  10 mEq Intravenous Q1 Hr x 6  .  vancomycin  1,000 mg Intravenous Q12H  . [DISCONTINUED] vancomycin  1,000 mg Intravenous Q8H   Infusions:     . [EXPIRED] sodium chloride 125 mL/hr at 12/23/12 1226  . sodium chloride 100 mL/hr at 12/23/12 2304  . [EXPIRED] TPN (CLINIMIX) +/- additives 115 mL/hr at 12/22/12 1813   And  . [EXPIRED] fat emulsion 250 mL (12/22/12 1900)  . TPN (CLINIMIX) +/- additives 83 mL/hr at 12/23/12 1708   Insulin Requirements in the past 24 hours:  CBG 118-142 3 units Novolog SSI 20 units regular insulin in TPN bag Per [facility] med list, patient on SSI and regular insulin in TPN (44.6 units)  Current Nutrition:  Goal rate:  Clinimix E5/20 at 158ml/hr + Lipids MWF to provide Protein 138gm and 2909 kcal MWF, 2429 Kcal TTSS for avg of 2634 kcal TPN PTA provide 2624 Kcals, 140g Protein, fluid (125 ml/hr over 24 hrs) NPO  Assessment:  56 YOM with complex history including terminal peritoneal carcinomatosis s/p chemo.  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  Pt with LLQ drain, LUQ drain, ileostomy, choly drain, NG tube.    Pt transferred to [facility] on 12/20[facility]11 from [facility] for SBO management, N/V, high NG  output.  No further plans for chemotherapy or surgery at this time.  Pt developed hypotension and tachycardia and transferred to ICU on 1/6 with possible sepsis (cholangitis after biliary drain manipulation)  Labs: hypokalemia (3.1, will replete), Na=134 (low/stable, unable to adjust in TNA), Phos and  Mag wnl, Corrected Ca 10.2 (1/8) Renal:  SCr significantly improved: 0.64 --> 1.18 --> 0.79 (1/8) LFTs: AST/ALT elevated, Tbili = 1.5 (1/6) CBGs:  CBGs at goal < 150 mg/dl IVF: NS at 161 ml/hr GI PPx: Protonix  Chol/Trigs: 94/66 (1/4) Prealbumin: 29.8 (1/4)  Nutritional Goals:  RD recs (12/21/12):  Kcal: 2600-2800, Protein: 130-140 gm, Fluid: >2.6L/day Continuing approx same formula as [facility]  Plan:   Resume TPN at goal rate, Clinimix E 5/20 at  115 ml/hr.   KCl IV x6 runs   IV Lipids MWF d/t backorder.    Resume insulin 30 units/bag and monitor CBGs   Standard multivitamins and trace elements (MWF only due to ongoing shortage).  IVF per Md.  TNA lab panels on Mondays & Thursdays.  F/u daily.   Lynann Beaver PharmD, BCPS Pager [phone] 12/24/2012 8:18 AM

## 2012-12-24 NOTE — Progress Notes (Signed)
Physical Therapy Treatment Patient Details Name: [name] MRN: [id number] DOB: 06-05-1956 Today's Date: 12/24/2012 Time: 1191-4782 PT Time Calculation (min): 29 min  PT Assessment / Plan / Recommendation Comments on Treatment Session  Pt tolerated ambulation in room (to wall and back to bed) very well today.  HR up to 120's with ambulation.  Continue to note pts overal weakness and ambulates with flexed posture.  Pt very motivated to participate with therapy.     Follow Up Recommendations  SNF;LTACH;Supervision/Assistance - 24 hour     Does the patient have the potential to tolerate intense rehabilitation     Barriers to Discharge        Equipment Recommendations  Rolling walker with 5" wheels    Recommendations for Other Services    Frequency Min 3X/week   Plan Discharge plan remains appropriate    Precautions / Restrictions Precautions Precautions: Fall Precaution Comments: Multiple lines/drains  Restrictions Weight Bearing Restrictions: No   Pertinent Vitals/Pain No pain    Mobility  Bed Mobility Bed Mobility: Sit to Supine Sit to Supine: 5: Supervision;With rail;HOB flat Details for Bed Mobility Assistance: Pt able to get LEs into bed and lower trunk with supervision for safety.  Transfers Transfers: Sit to Stand;Stand to Sit Sit to Stand: 1: +2 Total assist;With upper extremity assist;From chair/3-in-1 Sit to Stand: Patient Percentage: 70% Stand to Sit: 1: +2 Total assist;To bed;With upper extremity assist Stand to Sit: Patient Percentage: 70% Details for Transfer Assistance: Assist to rise and steady with cues for hand placement, safety and ensuring controlled descent when sitting Ambulation/Gait Ambulation/Gait Assistance: 1: +2 Total assist Ambulation/Gait: Patient Percentage: 70% Ambulation Distance (Feet): 22 Feet Assistive device: Rolling walker Ambulation/Gait Assistance Details: +2 assist for safety and negotiating lines.  cues for maintaining  position inside of RW due to pt stepping too far past RW. Also noted that pt has flexed knees with ambulation as well.   Gait Pattern: Step-through pattern Gait velocity: decreased General Gait Details: Pt with diffuse muscle atrophy and overall very weak.  He states that he feels like his legs are "going to break."  did not note any knee buckling during session.     Exercises     PT Diagnosis:    PT Problem List:   PT Treatment Interventions:     PT Goals Acute Rehab PT Goals PT Goal Formulation: With patient Time For Goal Achievement: 01/06/13 Potential to Achieve Goals: Good Pt will go Sit to Supine/Side: with modified independence PT Goal: Sit to Supine/Side - Progress: Updated due to goal met Pt will go Sit to Stand: with supervision PT Goal: Sit to Stand - Progress: Progressing toward goal Pt will go Stand to Sit: with supervision PT Goal: Stand to Sit - Progress: Progressing toward goal Pt will Ambulate: 16 - 50 feet;with mod assist;with least restrictive assistive device PT Goal: Ambulate - Progress: Progressing toward goal  Visit Information  Last PT Received On: 12/24/12 Assistance Needed: +2    Subjective Data  Subjective: I hope you all can come back tomorrow.  Patient Stated Goal: to be able to walk again.    Cognition  Overall Cognitive Status: Appears within functional limits for tasks assessed/performed Arousal/Alertness: Awake/alert Orientation Level: Appears intact for tasks assessed Behavior During Session: [facility] for tasks performed    Balance  Static Sitting Balance Static Sitting - Balance Support: No upper extremity supported;Feet supported Static Sitting - Level of Assistance: 5: Stand by assistance  End of Session PT - End  of Session Equipment Utilized During Treatment: Gait belt Activity Tolerance: Patient tolerated treatment well Patient left: in bed;with call bell/phone within reach;with family/visitor present Nurse Communication: Mobility  status   GP     Vista Deck 12/24/2012, 3:33 PM

## 2012-12-24 NOTE — Evaluation (Signed)
Occupational Therapy Evaluation Patient Details Name: [name] MRN: [id number] DOB: [birth year] Today's Date: 12/24/2012 Time: 9562-1308 OT Time Calculation (min): 28 min  OT Assessment / Plan / Recommendation Clinical Impression  Pt presents with peritoneal carcinomatosis s/p percutaneious cholecystostomy tube exchange.  Per pt and notes, he was at [facility] prior to hospital admission.  He states he was working with therapy, however hasn't gotten up with them in a few weeks. Pt fatigues quickly with HR elevating to the mid 120s with activity. Pt is motivated and anxious to return to PLOF. Skilled OT recommended to address the below mentioned deficits in prep for d/c to next venue of care.    OT Assessment  Patient needs continued OT Services    Follow Up Recommendations  SNF;LTACH    Barriers to Discharge      Equipment Recommendations  3 in 1 bedside comode    Recommendations for Other Services    Frequency  Min 2X/week    Precautions / Restrictions Precautions Precautions: Fall Precaution Comments: Multiple lines/drains    Pertinent Vitals/Pain Pt denied pain    ADL  Grooming: Set up Where Assessed - Grooming: Unsupported sitting Upper Body Bathing: Minimal assistance Where Assessed - Upper Body Bathing: Unsupported sitting Lower Body Bathing: Moderate assistance Where Assessed - Lower Body Bathing: Supported sit to stand Upper Body Dressing: Minimal assistance Where Assessed - Upper Body Dressing: Unsupported sitting Lower Body Dressing: Maximal assistance Where Assessed - Lower Body Dressing: Supported sit to stand Toilet Transfer: +2 Total assistance Toilet Transfer: Patient Percentage: 70% Acupuncturist: Other (comment) (recliner) Toileting - Clothing Manipulation and Hygiene: Moderate assistance Where Assessed - Toileting Clothing Manipulation and Hygiene: Standing Equipment Used: Gait belt;Rolling walker ADL Comments: Pt utilized urinal in  standing with mod A for clothing manipulation. Unable to reach feet for LB ADL.    OT Diagnosis: Generalized weakness  OT Problem List: Decreased activity tolerance;Impaired balance (sitting and/or standing);Decreased knowledge of use of DME or AE;Cardiopulmonary status limiting activity OT Treatment Interventions: Self-care/ADL training;DME and/or AE instruction;Therapeutic activities;Patient/family education;Balance training   OT Goals Acute Rehab OT Goals OT Goal Formulation: With patient/family Time For Goal Achievement: 01/07/13 Potential to Achieve Goals: Good ADL Goals Pt Will Perform Grooming: with supervision;Standing at sink ADL Goal: Grooming - Progress: Goal set today Pt Will Transfer to Toilet: with supervision;Ambulation;with DME ADL Goal: Toilet Transfer - Progress: Goal set today Pt Will Perform Toileting - Clothing Manipulation: with supervision;Standing;Sitting on 3-in-1 or toilet ADL Goal: Toileting - Clothing Manipulation - Progress: Goal set today Pt Will Perform Toileting - Hygiene: with supervision;Sit to stand from 3-in-1/toilet ADL Goal: Toileting - Hygiene - Progress: Goal set today Additional ADL Goal #1: Pt will complete all aspects of bathing and dressing with setup/min A. ADL Goal: Additional Goal #1 - Progress: Goal set today  Visit Information  Last OT Received On: 12/24/12 Assistance Needed:  (for lines and leads)    Subjective Data  Subjective: I'm going to get in this bed myself. Patient Stated Goal: Start walking by myself again.   Prior Functioning     Home Living Lives With: Other (Comment) (comment: Was at [facility]) Additional Comments: Pt states he was ambulating with therapy with RW, however it has been weeks since he has gotten up with therapy.  Prior Function Level of Independence: Needs assistance Needs Assistance: Bathing;Dressing;Toileting Able to Take Stairs?: No Driving: No Vocation: Retired Comments: Pt did not specify  how much assistance he needed. Communication Communication:  No difficulties Dominant Hand: Right         Vision/Perception     Cognition  Overall Cognitive Status: Appears within functional limits for tasks assessed/performed Arousal/Alertness: Awake/alert Orientation Level: Appears intact for tasks assessed Behavior During Session: [facility] for tasks performed    Extremity/Trunk Assessment Right Upper Extremity Assessment RUE ROM/Strength/Tone: [facility] for tasks assessed Left Upper Extremity Assessment LUE ROM/Strength/Tone: WFL for tasks assessed     Mobility Bed Mobility Bed Mobility: Sit to Supine Sit to Supine: 5: Supervision;With rail;HOB flat Transfers Sit to Stand: 1: +2 Total assist;With upper extremity assist;From chair/3-in-1 Sit to Stand: Patient Percentage: 70% Stand to Sit: 1: +2 Total assist;To bed;With upper extremity assist Details for Transfer Assistance: Assist to rise and steady with cues for hand placement, safety and ensuring controlled descent when sitting     Shoulder Instructions     Exercise     Balance Static Sitting Balance Static Sitting - Balance Support: No upper extremity supported;Feet supported Static Sitting - Level of Assistance: 5: Stand by assistance   End of Session OT - End of Session Equipment Utilized During Treatment: Gait belt Activity Tolerance: Patient limited by fatigue Patient left: in bed;with call bell/phone within reach  GO     Jalaysha Skilton A OTR/L [phone] 12/24/2012, 3:31 PM

## 2012-12-25 ENCOUNTER — Inpatient Hospital Stay (HOSPITAL_COMMUNITY): Payer: 59

## 2012-12-25 LAB — PHOSPHORUS: Phosphorus: 4.3 mg/dL (ref 2.3–4.6)

## 2012-12-25 LAB — GLUCOSE, CAPILLARY
Glucose-Capillary: 137 mg/dL — ABNORMAL HIGH (ref 70–99)
Glucose-Capillary: 140 mg/dL — ABNORMAL HIGH (ref 70–99)

## 2012-12-25 LAB — CULTURE, BLOOD (ROUTINE X 2)

## 2012-12-25 LAB — MAGNESIUM: Magnesium: 1.3 mg/dL — ABNORMAL LOW (ref 1.5–2.5)

## 2012-12-25 LAB — COMPREHENSIVE METABOLIC PANEL
AST: 34 U/L (ref 0–37)
BUN: 18 mg/dL (ref 6–23)
CO2: 21 mEq/L (ref 19–32)
Chloride: 104 mEq/L (ref 96–112)
Creatinine, Ser: 0.64 mg/dL (ref 0.50–1.35)
GFR calc Af Amer: >90 mL/min (ref >90)
GFR calc non Af Amer: >90 mL/min (ref >90)
Glucose, Bld: 126 mg/dL — ABNORMAL HIGH (ref 70–99)
Total Bilirubin: 1.2 mg/dL (ref 0.3–1.2)

## 2012-12-25 LAB — VANCOMYCIN, TROUGH: Vancomycin Tr: 17.2 ug/mL (ref 10.0–20.0)

## 2012-12-25 MED ORDER — POTASSIUM CHLORIDE 10 MEQ/100ML IV SOLN
10.0000 meq | INTRAVENOUS | Status: AC
  Administered 2012-12-25 (×4): 10 meq via INTRAVENOUS
  Filled 2012-12-25: qty 400

## 2012-12-25 MED ORDER — MAGNESIUM SULFATE 40 MG/ML IJ SOLN
2.0000 g | Freq: Once | INTRAMUSCULAR | Status: AC
  Administered 2012-12-25: 2 g via INTRAVENOUS
  Filled 2012-12-25: qty 50

## 2012-12-25 MED ORDER — IOHEXOL 300 MG/ML  SOLN
150.0000 mL | Freq: Once | INTRAMUSCULAR | Status: AC | PRN
  Administered 2012-12-25: 150 mL via ORAL

## 2012-12-25 MED ORDER — SODIUM CHLORIDE 0.9 % IJ SOLN
10.0000 mL | Freq: Two times a day (BID) | INTRAMUSCULAR | Status: DC
  Administered 2012-12-31: 10 mL
  Administered 2013-01-01 – 2013-01-02 (×2): 40 mL
  Administered 2013-01-02 – 2013-01-07 (×3): 10 mL

## 2012-12-25 MED ORDER — SODIUM CHLORIDE 0.9 % IJ SOLN
10.0000 mL | INTRAMUSCULAR | Status: DC | PRN
  Administered 2012-12-25 – 2012-12-29 (×3): 10 mL

## 2012-12-25 MED ORDER — INSULIN REGULAR HUMAN 100 UNIT/ML IJ SOLN
INTRAMUSCULAR | Status: AC
  Administered 2012-12-25: 17:00:00 via INTRAVENOUS
  Filled 2012-12-25: qty 2760

## 2012-12-25 NOTE — Progress Notes (Signed)
Events noted.  Appreciate input from the Palliative medicine team. Blood cultures positive for MRSA.  Clinically appears stable.   His most recent positive blood culture and sepsis is another reason why he is not a candidate for chemotherapy.  By suggestion would be a transfer to [facility] under Surgery service if he desires full support and aggressive measures.

## 2012-12-25 NOTE — Progress Notes (Signed)
CARE MANAGEMENT NOTE 12/25/2012  Patient:  Alan, [name]   Account Number:  [id number]  Date Initiated:  12/24/2012  Documentation initiated by:  Yvon Mccord  Subjective/Objective Assessment:   pt transferred from floor to icu on [id number] due to increased ams and sob.     Action/Plan:   tbd   Anticipated DC Date:  12/28/2012   Anticipated DC Plan:  [facility]         Choice offered to / List presented to:             Status of service:  In process, will continue to follow Medicare Important Message given?   (If response is "NO", the following Medicare IM given date fields will be blank) Date Medicare IM given:   Date Additional Medicare IM given:    Discharge Disposition:    Per UR Regulation:  Reviewed for med. necessity/level of care/duration of stay  If discussed at Long Length of Stay Meetings, dates discussed:    Comments:  [id number]/[name] Alan Plater, RN, BSN, CCM: CHART REVIEWED AND UPDATED.  Next chart review due on [id number]. NO DISCHARGE NEEDS PRESENT AT THIS TIME.  Outcome for patient not resolved at this time, patient wanting to go to [facility], family is having great difficulty dealing with the future outcomes of the patient and wants all measures done. pt remains npo with hg tube and tna on going. CASE MANAGEMENT [phone]  [id number] Alan Plater, RN, BSN,CCM NO DISCHARGE NEEDS PRESENT AT THIS TIME. CASE MANAGEMENT [phone]

## 2012-12-25 NOTE — Consult Note (Signed)
Patient [name]      DOB: 1956-06-10      WUJ:[id number]     Consult Note from the Palliative Medicine Team at [facility]    Consult Requested by: Dr. 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 Patient has had extensive abdominal surgery which has left him with multiple fistulous and drains, and recent cholecystitis requiring cholecystostomy tube. Patient has received most of his care at [facility]. The patient relates that at this time he recognizes that he has cancer that may contribute to his death however at this time he is hopeful for a period of time of quality and dignity. He states that he desires to pursue any treatment that will give him that quality and dignity even if it means transitioning to another facility. At this time he is somewhat frustrated with the multiple medical opinions and interventions. He feels that he desires to live until he dies, and does not want to give up. His family is supportive this and desire to have all avenues explored and all her conditions treated better treatable in order to promote the time that he is requesting. Recovery openly and honestly about the fact that there will come a point in his illness where he may need to except that there are no other curative options available for him. We discussed that at this time he is simply requesting to pursue curative interventions and information related to this options. I assured him that I will pursue communication with Dr. [name] and his other physicians to allow him the opportunity to have these interventions, and that I would be honest with him within the context of his illness to try to help him with planning for the future. It is obvious that we must  pursue some of these curative options in order to allow him to have the time to process the magnitude and impact of his illness on his current life. I therefore will continue with Dr. [name] in the morning regarding his willingness to assist further in his care if necessary and will communicate with his primary physician regarding intervening for treatment of his infection, and specifically for intervening related to the possible removal of drains and NG tube. We will continue in a stepwise fashion to address his goals of care once these barriers have been removed.   Goals of Care: 1.  Code Status: Full code   2. Scope of Treatment: [name] is expecting full communication and full intervention related to his current infection, and drains. He would like to effective communication amongst the care team with reasons for the need for specific treatment so that he can analyze these for himself. He is expecting that if we need to remove his IV lines to treat his infection that that would be taken care of, and he desires [place] to explore why his NG tube are drains cannot be removed at this time.  4. Disposition: To be determined the patient was adamant LTaCHattempting to rehabilitate   3. Symptom Management:   1.Currently [name] symptoms of nausea vomiting are under control with the NG tube in place this should continue and we should investigate whether or not there are any options for removal in the near future. The patient would like to try to  eat some soft food or liquids if possible. I will asked the primary service 10 beats further assistance from surgery on this matter. 2. Drain removal. The patient asked [place] to explore what his options are in this area. 3. Pain the patient describes none at this time as needed medication can be utilized 4. Existential distress. We will continue to work to build a trusting relationship and obtain all information pertinent to the patient's care to allow him to be in  control of his medical situation. I will communicate with the primary service with surgical service with Dr. [name]. I was able to be at the bedside with [name] when he informed the patient that he has no further chemotherapeutic options. I reminded the patient if this conversation when he informed his family that he wanted to get stronger to have further chemotherapy. 4. Psychosocial: Patient is married and has several children. His extended family is very much in support of him and were in attendance at this meeting including multiple brothers and sisters  5. Spiritual: He is a very spiritual man and is relying on his Faith for curative action.  Patient Documents Completed or Given: Document Given Completed  Advanced Directives Pkt    MOST    DNR    Gone from My Sight    Hard Choices      Brief HPI: Patient is a 57 are old [place] American male recently diagnosed with carcinomatosis. CT guided biopsy showed adenocarcinoma possible gastric origin. Patient has undergone multiple surgical interventions. 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 He has developed a duodenal obstruction which was stented but remains very narrow not passing material fast enough to allow him to effectively eat. The patient is having difficulty accepting that he may be near the end of his life. At this time he is only able to absorb that he will desire supportive nutrition and any interventions that will allow him to have quality and dignity, along with an quantity of time. His course has been complicated by a MRSA bacteremia with transient sepsis which is improved. We were asked to assist with goals of care and related symptoms   ROS: At this time he denies nausea vomiting or diarrhea he has no significant pain    PMH:  Past Medical History  Diagnosis Date  . Hypertension   . GERD (gastroesophageal reflux disease)   . Anemia   . Small bowel obstruction 2013  . Abdominal malignant neoplasm 10/2012     peritoneal cancer s/p chemo/ sx  . 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 Acute cholecystitis s/p perc draianage [id number] 12/20/2012     PSH: Past Surgical History  Procedure Date  . Tonsillectomy   . Esophagogastroduodenoscopy 12/11/2012    Procedure: ESOPHAGOGASTRODUODENOSCOPY (EGD);  Surgeon: Louis Meckel, MD;  Location: [facility] ENDOSCOPY;  Service: Endoscopy;  Laterality: N/A;  . Duodenal stent placement 12/11/2012    Procedure: DUODENAL STENT PLACEMENT;  Surgeon: Louis Meckel, MD;  Location: [facility] ENDOSCOPY;  Service: Endoscopy;  Laterality: N/A;  . Esophagogastroduodenoscopy 12/13/2012    Procedure: ESOPHAGOGASTRODUODENOSCOPY (EGD);  Surgeon: Louis Meckel, MD;  Location: [facility] OR;  Service: Endoscopy;  Laterality: N/A;  with attempt to stent duodenal stricture  . Peg placement 12/12/2012    Procedure: PERCUTANEOUS ENDOSCOPIC GASTROSTOMY (PEG) PLACEMENT;  Surgeon: Louis Meckel, MD;  Location[facility] ENDOSCOPY;  Service: Endoscopy;  Laterality: N/A;  . Small intestine surgery jan 2013    [facility].  SB resection, ileostomy, gastrostomy  . Debulking sep  2013    [facility].  ileal SB resection, debulking of peritoneal carcinomatosis, intraperitoneal chemotherapy  . Colon surgery oct 2013    [facility] transverse colon perforation   I have reviewed the FH and SH and  If appropriate update it with new information. Allergies  Allergen Reactions  . Percocet (Oxycodone-Acetaminophen)     hallucination   Scheduled Meds:   . antiseptic oral rinse  15 mL Mouth Rinse BID  . Chlorhexidine Gluconate Cloth  6 each Topical Q0600  . enoxaparin (LOVENOX) injection  40 mg Subcutaneous Q24H  . insulin aspart  0-9 Units Subcutaneous Q6H  . lip balm  1 application Topical BID  . magnesium sulfate 1 - 4 g bolus IVPB  2 g Intravenous Once  . metoCLOPramide  10 mg Intravenous QID  . mupirocin ointment  1 application Nasal BID  . pantoprazole  40 mg Intravenous Q12H  . potassium chloride  10 mEq Intravenous Q1 Hr  x 4  . vancomycin  1,000 mg Intravenous Q8H   Continuous Infusions:   . sodium chloride 10 mL/hr at 12/24/12 1441  . TPN (CLINIMIX) +/- additives 115 mL/hr at 12/24/12 1711   And  . fat emulsion 250 mL (12/24/12 1710)   PRN Meds:.bisacodyl, diphenhydrAMINE, magic mouthwash, menthol-cetylpyridinium, ondansetron, phenol, promethazine    BP 113/69  Pulse 68  Temp 98.9 F (37.2 C) (Oral)  Resp 28  Ht 6\' 4"  (1.93 m)  Wt 92.7 kg (204 lb 5.9 oz)  BMI 24.88 kg/m2  SpO2 100%   PPS: 40-50%   Intake/Output Summary (Last 24 hours) at 12/25/12 1149 Last data filed at 12/25/12 1139  Gross per 24 hour  Intake   4368 ml  Output   3560 ml  Net    808 ml   LBM   12/24/2012                    Physical Exam:  General: No acute distress, reasonably nourished African American male HEENT:  Pupils are equal reactive to light extra ocular muscles are intact his membranes are moist neck is supple there's no JVD no lymph nodes no cardiac bruits his dentition is intact Chest:  Decreased but clear to auscultation his blockers or wheezes CVS:  Regular rate and rhythm positive S1 and S2 no S3-S4 Abdomen: multiple areas of fistulized to bowel are covered with double ostomy bags. He has a right upper quadrant cholecystostomy tube, and 2 Jackson-Pratt drains on the left. Ext: warm without any mottling Neuro: Awake alert oriented cranial nerves II through XII are intact power is 4/5  Labs: CBC    Component Value Date/Time   WBC 4.6 12/24/2012 1645   WBC 5.8 12/06/2011 1405   RBC 2.77* 12/24/2012 1645   RBC 4.32 12/06/2011 1405   HGB 7.8* 12/24/2012 1645   HGB 12.5* 12/06/2011 1405   HCT 23.2* 12/24/2012 1645   HCT 37.4* 12/06/2011 1405   PLT 140* 12/24/2012 1645   PLT 228 12/06/2011 1405   MCV 83.8 12/24/2012 1645   MCV 86.6 12/06/2011 1405   MCH 28.2 12/24/2012 1645   MCH 28.9 12/06/2011 1405   MCHC 33.6 12/24/2012 1645   MCHC 33.4 12/06/2011 1405   RDW 15.6* 12/24/2012 1645   RDW 14.9* 12/06/2011  1405   LYMPHSABS 1.3 12/22/2012 0920   LYMPHSABS 1.3 12/06/2011 1405   MONOABS 1.6* 12/22/2012 0920   MONOABS 0.8 12/06/2011 1405   EOSABS 0.1 12/22/2012 0920   EOSABS 0.1 12/06/2011 1405  BASOSABS 0.0 12/22/2012 0920   BASOSABS 0.0 12/06/2011 1405     CMP     Component Value Date/Time   NA 134* 12/25/2012 0500   K 3.4* 12/25/2012 0500   CL 104 12/25/2012 0500   CO2 21 12/25/2012 0500   GLUCOSE 126* 12/25/2012 0500   BUN 18 12/25/2012 0500   CREATININE 0.64 12/25/2012 0500   CALCIUM 9.3 12/25/2012 0500   PROT 5.2* 12/25/2012 0500   ALBUMIN 1.9* 12/25/2012 0500   AST 34 12/25/2012 0500   ALT 63* 12/25/2012 0500   ALKPHOS 449* 12/25/2012 0500   BILITOT 1.2 12/25/2012 0500   GFRNONAA >90 12/25/2012 0500   GFRAA >90 12/25/2012 0500    Chest Xray Reviewed/Impressions: No acute pulmonary disease as of 12/22/2012  CT scan of the abdomen/pelvis  Reviewed/Impressions:The proximal jejunal stent device is patent although only of very  thin string of contrast material passes through the stent into the  decompressed small bowel distal to the stent. Even with the stent  in place, this segment of bowel appears to represent a region of  luminal stenosis. The duodenum proximal to the stent is distended.  Distal stomach appears to have abnormal circumferential wall  thickening with some luminal compromise, but no outlet obstruction  at this time.      Time In Time Out Total Time Spent with Patient Total Overall Time   6:45 PM 7:50 PM   65 minutes   65 minutes     Greater than 50%  of this time was spent counseling and coordinating care related to the above assessment and plan.   Alan Mcglown L. [name], MD MBA The Palliative Medicine Team at [facility] Phone: [phone] Pager: [phone]

## 2012-12-25 NOTE — Progress Notes (Signed)
ANTIBIOTIC CONSULT NOTE - FOLLOW UP  Pharmacy Consult for Vancomycin Indication: MRSA Bacteremia  Allergies  Allergen Reactions  . Percocet (Oxycodone-Acetaminophen)     hallucination    Patient Measurements: Height: 6\' 4"  (193 cm) Weight: 204 lb 5.9 oz (92.7 kg) IBW/kg (Calculated) : 86.8   Vital Signs: Temp: 98.9 F (37.2 C) (01/09 0352) Temp src: Oral (01/09 0352) BP: 113/69 mmHg (01/09 0755) Pulse Rate: 68  (01/09 0755) Intake/Output from previous day: 01/08 0701 - 01/09 0700 In: 4410 [I.V.:980; IV Piggyback:850; TPN:2580] Out: 3985 [Urine:1500; Emesis/NG output:2025; Drains:110; Stool:350]  Labs:  Basename 12/25/12 0500 12/24/12 1645 12/24/12 0400 12/23/12 0445  WBC -- 4.6 3.8* --  HGB -- 7.8* 7.0* --  PLT -- 140* 108* --  LABCREA -- -- -- --  CREATININE 0.64 -- 0.79 1.18   Estimated Creatinine Clearance: 126.6 ml/min (by C-G formula based on Cr of 0.64).  Basename 12/24/12 0935  VANCOTROUGH 11.8  VANCOPEAK --  VANCORANDOM --  GENTTROUGH --  GENTPEAK --  GENTRANDOM --  TOBRATROUGH --  TOBRAPEAK --  TOBRARND --  AMIKACINPEAK --  AMIKACINTROU --  AMIKACIN --     Microbiology: Recent Results (from the past 720 hour(s))  CULTURE, BLOOD (ROUTINE X 2)     Status: Normal   Collection Time   12/22/12 12:55 PM      Component Value Range Status Comment   Specimen Description BLOOD LEFT HAND   Final    Special Requests BOTTLES DRAWN AEROBIC AND ANAEROBIC [facility]   Final    Culture  Setup Time 12/22/2012 22:48   Final    Culture     Final    Value: METHICILLIN RESISTANT STAPHYLOCOCCUS AUREUS     Note: RIFAMPIN AND GENTAMICIN SHOULD NOT BE USED AS SINGLE DRUGS FOR TREATMENT OF STAPH INFECTIONS. CRITICAL RESULT CALLED TO, READ BACK BY AND VERIFIED WITH: JENNIFER FUQUAY @ 1258 12/24/12 BY KRAWS     Note: Gram Stain Report Called to,Read Back By and Verified With: Burnell Blanks @ 1254 12/23/12 BY KRAWS   Report Status 12/25/2012 FINAL   Final    Organism ID, Bacteria  METHICILLIN RESISTANT STAPHYLOCOCCUS AUREUS   Final   CULTURE, BLOOD (ROUTINE X 2)     Status: Normal   Collection Time   12/22/12  1:05 PM      Component Value Range Status Comment   Specimen Description BLOOD LEFT HAND   Final    Special Requests BOTTLES DRAWN AEROBIC AND ANAEROBIC [facility]   Final    Culture  Setup Time 12/22/2012 22:48   Final    Culture     Final    Value: STAPHYLOCOCCUS AUREUS     Note: SUSCEPTIBILITIES PERFORMED ON PREVIOUS CULTURE WITHIN THE LAST 5 DAYS.     Note: Gram Stain Report Called to,Read Back By and Verified With: KIM Richardson Dopp @ 1254 12/23/12 BY KRAWS   Report Status 12/25/2012 FINAL   Final   MRSA PCR SCREENING     Status: Abnormal   Collection Time   12/22/12  5:42 PM      Component Value Range Status Comment   MRSA by PCR POSITIVE (*) NEGATIVE Final     Anti-infectives     Start     Dose/Rate Route Frequency Ordered Stop   12/24/12 1000   vancomycin (VANCOCIN) IVPB 1000 mg/200 mL premix        1,000 mg 200 mL/hr over 60 Minutes Intravenous Every 8 hours 12/24/12 0836     12/23/12  2200   vancomycin (VANCOCIN) IVPB 1000 mg/200 mL premix  Status:  Discontinued        1,000 mg 200 mL/hr over 60 Minutes Intravenous Every 12 hours 12/23/12 1040 12/24/12 0836   12/22/12 1600   vancomycin (VANCOCIN) IVPB 1000 mg/200 mL premix  Status:  Discontinued        1,000 mg 200 mL/hr over 60 Minutes Intravenous Every 8 hours 12/22/12 1539 12/23/12 1040   12/22/12 1600   piperacillin-tazobactam (ZOSYN) IVPB 3.375 g  Status:  Discontinued        3.375 g 12.5 mL/hr over 240 Minutes Intravenous Every 8 hours 12/22/12 1539 12/24/12 1441          Assessment:  57 YO M with h/o terminal peritoneal carcinomatosis s/p chemo. Pt developed hypotension and tachycardia 1/6 with possible sepsis (cholangitis after biliary drain manipulation)  12/22/12 blood cultures now growing MRSA x2  Today is Day #4 Vancomycin. Trough obtained yesterday was low (11.8), so dose was changed to  q8h regimen  Will check a Vanco trough before 18:00 dose tonight (prior to 5th dose of new q8h regimen)  Goal of Therapy:  Vancomycin trough level 15-20 mcg/ml Appropriate abx dosing, eradication of infection.  Plan:   Vanco trough at 17:30 tonight - pharmacy will f/u  Darrol Angel, PharmD Pager: [phone] 12/25/2012 9:36 AM

## 2012-12-25 NOTE — Progress Notes (Signed)
Subjective: Pt feeling ok. Asked about drain removals. S/p exchange of perc chole drain 1/6  Objective: Physical Exam: BP 113/69  Pulse 68  Temp 98.9 F (37.2 C) (Oral)  Resp 28  Ht 6\' 4"  (1.93 m)  Wt 204 lb 5.9 oz (92.7 kg)  BMI 24.88 kg/m2  SpO2 100% RUQ chole drain intact, site clean. Scant old bilious material in tube, really no significant output since day of drain exchange  Labs: CBC  Basename 12/24/12 1645 12/24/12 0400  WBC 4.6 3.8*  HGB 7.8* 7.0*  HCT 23.2* 20.7*  PLT 140* 108*   BMET  Basename 12/25/12 0500 12/24/12 0400  NA 134* 134*  K 3.4* 3.1*  CL 104 106  CO2 21 20  GLUCOSE 126* 123*  BUN 18 23  CREATININE 0.64 0.79  CALCIUM 9.3 9.0   LFT  Basename 12/25/12 0500  PROT 5.2*  ALBUMIN 1.9*  AST 34  ALT 63*  ALKPHOS 449*  BILITOT 1.2  BILIDIR --  IBILI --  LIPASE --   PT/INR No results found for this basename: LABPROT:2,INR:2 in the last 72 hours   Studies/Results: No results found.  Assessment/Plan: S/p perc chole drain exchange 1/6 Discussed procedure findings with pt, that a lot of debris/sludge in cystic and CB ducts that prevents normal flow and if chole drain removed, he is likely to have recurrent cholecystitis. Cont drain to gravity bag, but would not expect much output, Bili now normal at 1.2   LOS: 6 days    Breia Ocampo PA-C 12/25/2012 9:00 AM

## 2012-12-25 NOTE — Progress Notes (Signed)
Chaplain visited with pt and son on rounds.  Pt welcoming of chaplain presence.  Pt in good spirits and has support from family and faith community.  Chaplain provided emotional and spiritual support - pt detailed that his faith and values were of utmost and importance and spoke with chaplain about his life and health narrative.  Pt's son was present in room.  Son is basketball player, recently graduating from college, and has postponed playing in [place] due to his father's illness.   Will continue to follow for support during admission.

## 2012-12-25 NOTE — Progress Notes (Signed)
[facility] PROGRESS NOTE  [name] AVW:[id number] DOB: 07-22-1956 DOA: 12/19/2012 PCP: [name], MD  Assessment/Plan: 1. Septic Shock secondary to MRSA bacteremia-? Line sepsis (has PICC line and Port-A-Cath): Less likely from Cholangitis. Patient was transferred to ICU a couple of days ago for hypotension and tachycardia. He was empirically placed on vancomycin and Zosyn. Zosyn has since then been discontinued. His blood pressure stabilized without pressors. Critical care medicine consulted and signed off. His blood cultures shows MRSA in 2 bottles. Patient has opted continued aggressive care. Discussed with ID-recommend removing both PICC line and Port-A-Cath simultaneously followed by 48-72 hours of line holiday, repeating surveillance blood cultures after lines are out and if negative then placed new PICC line. Discussed with patient who verbalizes understanding. Dr. [name] plans to discuss patient's care with Dr. [name] at [facility] await her input prior to removal of lines. 2. Pancytopenia: New leukopenia and thrombocytopenia. Leukopenia resolved. Anemia stable. Thrombocytopenia better.? Secondary to sepsis. Follow CBC closely. 3. Hypokalemia and hypomagnesemia: Pharmacy repeating IV potassium and magnesium. Follow BMP. 4. 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 Patient has had multiple surgical procedures (refer to surgeon's notes). Currently Surgery and GI does not have any further interventions to offer. No further interventions planned. Palliative care team met with patient and extended family on 1/8 and patient wishes to pursue further cancer treatment possibly at [facility]. [name] assisting with coordination-much appreciated. 5. Small bowel obstruction: Currently conservative management-n.p.o., NG tube and TNA. GI, IR and general surgery R. consulting-do not see a role  for any further aggressive intervention. Patient wishes to see for NG tube can be removed and he could take something by mouth. Surgeons consulted and are evaluating-likely that patient will start vomiting again. 6. Cholecystitis status post cholecystostomy drain placed at [facility] - IR performed cholecystostomy tube change on 1/6.  Code Status: Full Family Communication: Discussed with patient. Disposition Plan: Transfer to medical bed on 1/9.   Consultants:  Oncology   [facility]   IR  General surgery.  Critical care medicine.  Palliative care medicine  ID  Procedures:  NG tube  Change of cholecystostomy tube by IR on 1/6  Antibiotics:  IV vancomycin 1/6 >  IV Zosyn 1/6 > 1/8  HPI/Subjective: Mild bleeding from right nostril after blowing nose. Has NG tube on the same side. Stopped spontaneously.  Objective: Filed Vitals:   12/25/12 0352 12/25/12 0400 12/25/12 0600 12/25/12 0755  BP:  113/67 109/59 113/69  Pulse:  68 62 68  Temp: 98.9 F (37.2 C)     TempSrc: Oral     Resp:  26 24 28   Height:      Weight:      SpO2:  100% 100% 100%    Intake/Output Summary (Last 24 hours) at 12/25/12 1144 Last data filed at 12/25/12 1139  Gross per 24 hour  Intake   4368 ml  Output   3560 ml  Net    808 ml   Filed Weights   12/19/12 1646 12/23/12 0315 12/24/12 0400  Weight: 92.534 kg (204 lb) 93.5 kg (206 lb 2.1 oz) 92.7 kg (204 lb 5.9 oz)    Exam:   General exam: Comfortable.   Respiratory system: Clear to auscultation. No increased work of breathing.  Cardiovascular system: First and second heart sounds heard, regular rate and rhythm. No JVD, murmurs or pedal edema. Telemetry shows sinus rhythm  Gastroenterology:  Abdomen is nondistended, soft and nontender. RLQ ostomy draining minimal output. RUQ biliary/gallbladder drain-no significant output. Upper midline Eakin's  pouch with small slightly cloudy fluid. Left sided drain with minimal bloody  drainage in bulb.  Central nervous system: Alert and oriented. No focal neurological deficits.  Extremities: Symmetric 5 x 5 power.  ENT: Minimal right epistaxis. Right NG tube  Data Reviewed: Basic Metabolic Panel:  Lab 12/25/12 4540 12/24/12 0400 12/23/12 0445 12/22/12 0920 12/21/12 0640 12/20/12 0420  NA 134* 134* 132* 130* 132* --  K 3.4* 3.1* 3.8 4.2 3.7 --  CL 104 106 106 95* 95* --  CO2 21 20 19 28 28  --  GLUCOSE 126* 123* 148* 164* 160* --  BUN 18 23 45* 38* 37* --  CREATININE 0.64 0.79 1.18 0.64 0.64 --  CALCIUM 9.3 9.0 8.2* 10.5 10.2 --  MG 1.3* 1.5 1.3* 1.5 1.5 --  PHOS 4.3 3.3 -- 4.1 4.2 4.7*   Liver Function Tests:  Lab 12/25/12 0500 12/22/12 0920 12/21/12 0640 12/20/12 0420  AST 34 66* 127* 88*  ALT 63* 115* 157* 96*  ALKPHOS 449* 290* 269* 270*  BILITOT 1.2 1.5* 1.9* 1.2  PROT 5.2* 6.7 6.5 6.8  ALBUMIN 1.9* 2.5* 2.6* 2.7*   No results found for this basename: LIPASE:5,AMYLASE:5 in the last 168 hours No results found for this basename: AMMONIA:5 in the last 168 hours CBC:  Lab 12/24/12 1645 12/24/12 0400 12/22/12 0920 12/20/12 0420 12/19/12 0644  WBC 4.6 3.8* 7.3 6.0 5.7  NEUTROABS -- -- 4.3 3.5 --  HGB 7.8* 7.0* 10.2* 10.8* 10.3*  HCT 23.2* 20.7* 30.3* 32.4* 31.6*  MCV 83.8 84.5 83.7 84.4 85.9  PLT 140* 108* 201 223 193   Cardiac Enzymes: No results found for this basename: CKTOTAL:5,CKMB:5,CKMBINDEX:5,TROPONINI:5 in the last 168 hours BNP (last 3 results)  Basename 12/22/12 1700  PROBNP 33.8   CBG:  Lab 12/25/12 0609 12/25/12 0316 12/24/12 2358 12/24/12 1744 12/24/12 1136  GLUCAP 130* 137* 140* 145* 159*    Recent Results (from the past 240 hour(s))  CULTURE, BLOOD (ROUTINE X 2)     Status: Normal   Collection Time   12/22/12 12:55 PM      Component Value Range Status Comment   Specimen Description BLOOD LEFT HAND   Final    Special Requests BOTTLES DRAWN AEROBIC AND ANAEROBIC [facility]   Final    Culture  Setup Time 12/22/2012 22:48   Final      Culture     Final    Value: METHICILLIN RESISTANT STAPHYLOCOCCUS AUREUS     Note: RIFAMPIN AND GENTAMICIN SHOULD NOT BE USED AS SINGLE DRUGS FOR TREATMENT OF STAPH INFECTIONS. CRITICAL RESULT CALLED TO, READ BACK BY AND VERIFIED WITH: JENNIFER FUQUAY @ 1258 12/24/12 BY KRAWS     Note: Gram Stain Report Called to,Read Back By and Verified With: Burnell Blanks @ 1254 12/23/12 BY KRAWS   Report Status 12/25/2012 FINAL   Final    Organism ID, Bacteria METHICILLIN RESISTANT STAPHYLOCOCCUS AUREUS   Final   CULTURE, BLOOD (ROUTINE X 2)     Status: Normal   Collection Time   12/22/12  1:05 PM      Component Value Range Status Comment   Specimen Description BLOOD LEFT HAND   Final    Special Requests BOTTLES DRAWN AEROBIC AND ANAEROBIC [facility]   Final    Culture  Setup Time 12/22/2012 22:48   Final    Culture     Final  Value: STAPHYLOCOCCUS AUREUS     Note: SUSCEPTIBILITIES PERFORMED ON PREVIOUS CULTURE WITHIN THE LAST 5 DAYS.     Note: Gram Stain Report Called to,Read Back By and Verified With: KIM Richardson Dopp @ 1254 12/23/12 BY KRAWS   Report Status 12/25/2012 FINAL   Final   MRSA PCR SCREENING     Status: Abnormal   Collection Time   12/22/12  5:42 PM      Component Value Range Status Comment   MRSA by PCR POSITIVE (*) NEGATIVE Final      Studies: No results found.  Scheduled Meds:    . antiseptic oral rinse  15 mL Mouth Rinse BID  . Chlorhexidine Gluconate Cloth  6 each Topical Q0600  . enoxaparin (LOVENOX) injection  40 mg Subcutaneous Q24H  . insulin aspart  0-9 Units Subcutaneous Q6H  . lip balm  1 application Topical BID  . magnesium sulfate 1 - 4 g bolus IVPB  2 g Intravenous Once  . metoCLOPramide  10 mg Intravenous QID  . mupirocin ointment  1 application Nasal BID  . pantoprazole  40 mg Intravenous Q12H  . potassium chloride  10 mEq Intravenous Q1 Hr x 4  . vancomycin  1,000 mg Intravenous Q8H   Continuous Infusions:    . sodium chloride 10 mL/hr at 12/24/12 1441  . TPN (CLINIMIX) +/-  additives 115 mL/hr at 12/24/12 1711   And  . fat emulsion 250 mL (12/24/12 1710)    Principal Problem:  *Peritoneal carcinomatosis Active Problems:  ANEMIA  SBO (small bowel obstruction)  Hypokalemia  Nausea and vomiting in adult  Anemia of chronic disease  Colo-enteric fistula  Acute cholecystitis s/p perc draianage [id number]  Fistula of small intestine  Shock  Sepsis  Septic shock due to Staphylococcus aureus  Pancytopenia     [facility]  [facility] Pager [phone]. If 8PM-8AM, please contact night-coverage at [email or web address], password [facility] 12/25/2012, 11:44 AM  LOS: 6 days

## 2012-12-25 NOTE — Progress Notes (Signed)
  Pharmacy Note (Brief) - TNA  Pt currently receiving TNA for SBO via central line. Pt now has 2 positive blood cultures from 1/6 with MRSA. ID recommending central lines be pulled - agree from TNA standpoint (infection risk). TNA will have to be d/c'd until it's safe to place a new central line. Awaiting final decision by MDs - in the mean time, will address electrolyte abnormalities. Of note, patient currently receiving 30 units regular insulin over 24 hours in the TNA bag.  Labs:  -  Na low (unable to change TNA content).   - K+ slightly low - will supp using peripheral runs (in case central line gets d/c'd soon)  - Mg low - will supp IV  - CBGs w/in goal range <150 except 1 reading of 159 yesterday.  Plan: 1) KCl IV x 4 runs today (peripheral) 2) Magnesium 2g IV x1 today 3) If TNA does get d/c'd, will need to monitor CBGs closely, given large amount of insulin infusing via TNA bag (currently has orders for SSI q6h.  Darrol Angel, PharmD Pager: [phone] 12/25/2012 9:30 AM

## 2012-12-25 NOTE — Progress Notes (Signed)
Subjective: Comfortable in the chair.  Asking if it's possible to remove NG.  Objective: Vital signs in last 24 hours: Temp:  [96.9 F (36.1 C)-99.4 F (37.4 C)] 98.9 F (37.2 C) (01/09 0352) Pulse Rate:  [62-76] 68  (01/09 0755) Resp:  [22-31] 28  (01/09 0755) BP: (107-123)/(59-89) 113/69 mmHg (01/09 0755) SpO2:  [100 %] 100 % (01/09 0755) Last BM Date: 12/23/12  2025 ml from the NG, 100 ml from mid line drain, and 10 ml from closed system drain. TNA,  For nutrition, NPO, 99.4, K+ 3.4, alk phos up more Intake/Output from previous day: 01/08 0701 - 01/09 0700 In: 4410 [I.V.:980; IV Piggyback:850; TPN:2580] Out: 3985 [Urine:1500; Emesis/NG output:2025; Drains:110; Stool:350] Intake/Output this shift: Total I/O In: 715 [I.V.:40; IV Piggyback:300; TPN:375] Out: 100 [Urine:100]  General appearance: alert, cooperative and no distress GI: + BS, not really tender or distended.  2 eakins pouches in place, JP drain and cholecystostomy tube in place.  Lab Results:   Basename 12/24/12 1645 12/24/12 0400  WBC 4.6 3.8*  HGB 7.8* 7.0*  HCT 23.2* 20.7*  PLT 140* 108*    BMET  Basename 12/25/12 0500 12/24/12 0400  NA 134* 134*  K 3.4* 3.1*  CL 104 106  CO2 21 20  GLUCOSE 126* 123*  BUN 18 23  CREATININE 0.64 0.79  CALCIUM 9.3 9.0   PT/INR No results found for this basename: LABPROT:2,INR:2 in the last 72 hours   Lab 12/25/12 0500 12/22/12 0920 12/21/12 0640 12/20/12 0420  AST 34 66* 127* 88*  ALT 63* 115* 157* 96*  ALKPHOS 449* 290* 269* 270*  BILITOT 1.2 1.5* 1.9* 1.2  PROT 5.2* 6.7 6.5 6.8  ALBUMIN 1.9* 2.5* 2.6* 2.7*     Lipase     Component Value Date/Time   LIPASE 35 11/07/2011 1734     Studies/Results: No results found.  Medications:    . antiseptic oral rinse  15 mL Mouth Rinse BID  . Chlorhexidine Gluconate Cloth  6 each Topical Q0600  . enoxaparin (LOVENOX) injection  40 mg Subcutaneous Q24H  . insulin aspart  0-9 Units Subcutaneous Q6H  .  lip balm  1 application Topical BID  . magnesium sulfate 1 - 4 g bolus IVPB  2 g Intravenous Once  . metoCLOPramide  10 mg Intravenous QID  . mupirocin ointment  1 application Nasal BID  . pantoprazole  40 mg Intravenous Q12H  . potassium chloride  10 mEq Intravenous Q1 Hr x 4  . vancomycin  1,000 mg Intravenous Q8H    Assessment/Plan Septic Shock secondary to MRSA bacteremia-? Line sepsis (has PICC line and Port-A-Cath):  Cholecystitis status post cholecystostomy drain placed at [facility] - IR performed cholecystostomy tube change on 1/6. Metastatic Adenocarcinoma with signet ring feature, abdominal carcinomatosis, with debulking surgery 12/2011 and 9/2013with post op complications, requiring drainage, and JP drains.   Small bowel obstruction  Duodenal stent 12/27 by Dr. Arlyce Dice LLQ drain, LUQ drain, ileostomy,  Cholecystostomy drain, and now with NG.  MRSA positive blood culture  [facility]. Hypertension  GERD (gastroesophageal reflux disease)  anemia   Plan:  We are getting a plain film to look at position of NG and duodenal stent.  Pending results of that we will get an UGI, he has had these on 12/29, and 12/08/12. The last shows contrast remains in the stomach one and 2 hours after ingestion.  I have told him I think he would most likely start  vomiting after removal of NG.  Film pending.     LOS: 6 days    [name] 12/25/2012

## 2012-12-25 NOTE — Progress Notes (Signed)
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 Percocet (Oxycodone-Acetaminophen)     hallucination   Patient Measurements: Height: 6\' 4"  (193 cm) Weight: 204 lb 5.9 oz (92.7 kg) IBW/kg (Calculated) : 86.8   Vital Signs: Temp: 98.9 F (37.2 C) (01/09 0352) Temp src: Oral (01/09 0352) BP: 113/69 mmHg (01/09 0755) Pulse Rate: 68  (01/09 0755) Intake/Output from previous day: 01/08 0701 - 01/09 0700 In: 4410 [I.V.:980; IV Piggyback:850; TPN:2580] Out: 3985 [Urine:1500; Emesis/NG output:2025; Drains:110; Stool:350]  Labs:  [facility] 12/24/12 1645 12/24/12 0400  WBC 4.6 3.8*  HGB 7.8* 7.0*  HCT 23.2* 20.7*  PLT 140* 108*  APTT -- --  INR -- --     Basename 12/25/12 0500 12/24/12 0400 12/23/12 0445  NA 134* 134* 132*  K 3.4* 3.1* 3.8  CL 104 106 106  CO2 21 20 19   GLUCOSE 126* 123* 148*  BUN 18 23 45*  CREATININE 0.64 0.79 1.18  LABCREA -- -- --  CREAT24HRUR -- -- --  CALCIUM 9.3 9.0 8.2*  MG 1.3* 1.5 1.3*  PHOS 4.3 3.3 --  PROT 5.2* -- --  ALBUMIN 1.9* -- --  AST 34 -- --  ALT 63* -- --  ALKPHOS 449* -- --  BILITOT 1.2 -- --  BILIDIR -- -- --  IBILI -- -- --  PREALBUMIN -- -- --  TRIG -- -- --  CHOLHDL -- -- --  CHOL -- -- --   Estimated Creatinine Clearance: 126.6 ml/min (by C-G formula based on Cr of 0.64).   Medications:  Scheduled:     . antiseptic oral rinse  15 mL Mouth Rinse BID  . Chlorhexidine Gluconate Cloth  6 each Topical Q0600  . enoxaparin (LOVENOX) injection  40 mg Subcutaneous Q24H  . insulin aspart  0-9 Units Subcutaneous Q6H  . lip balm  1 application Topical BID  . magnesium sulfate 1 - 4 g bolus IVPB  2 g Intravenous Once  . metoCLOPramide  10 mg Intravenous QID  . mupirocin ointment  1 application Nasal BID  . pantoprazole  40 mg Intravenous Q12H  . potassium chloride  10 mEq Intravenous Q1 Hr x 4  .  [COMPLETED] potassium chloride  10 mEq Intravenous Q1 Hr x 6  . vancomycin  1,000 mg Intravenous Q8H  . [DISCONTINUED] piperacillin-tazobactam (ZOSYN)  IV  3.375 g Intravenous Q8H   Infusions:     . sodium chloride 10 mL/hr at 12/24/12 1441  . TPN (CLINIMIX) +/- additives 115 mL/hr at 12/24/12 1711   And  . fat emulsion 250 mL (12/24/12 1710)  . [EXPIRED] TPN (CLINIMIX) +/- additives 83 mL/hr at 12/23/12 1708   Insulin Requirements in the past 24 hours:  CBG 137-159 5 units Novolog SSI 30 units regular insulin in TPN bag Per [facility] list, patient on SSI and regular insulin in TPN (44.6 units)  Nutritional Goals:  RD recs (12/21/12):  Kcal: 2600-2800, Protein: 130-140 gm, Fluid: >2.6L/day Continuing approx same formula as LTACH  Current Nutrition:  Goal rate:  Clinimix E5/20 at 162ml/hr + Lipids MWF to provide Protein 138gm and 2909 kcal MWF, 2429 Kcal TTSS for avg of 2634 kcal TPN PTA provide 2624 Kcals, 140g Protein, fluid (125 ml/hr over 24 hrs) NPO (as of 12/20/12)  Assessment:  56 YOM with complex history including terminal peritoneal carcinomatosis s/p chemo.  Duodenal stent placed on12/27 but CT on 12/31  showed stent stenosis.  Pt with LLQ drain, LUQ drain, ileostomy, choly drain, NG tube.    Pt transferred to [facility] on 12/20/11 from [facility] for SBO management, N/V, high NG output.  No further plans for chemotherapy or surgery at this time.  Pt developed hypotension and tachycardia and transferred to ICU on 1/6 with possible sepsis (cholangitis after biliary drain manipulation)  12/23/11 blood cultures now positive for MRSA.   Spoke with Dr. Waymon Amato today - he'd like TNA to be ordered for tonight as ongoing discussion about central access (PICC, Port) is coordinated between multiple different MDs at different facilities [facility])  Labs: addressed in Brief TNA note from earlier today Renal:  SCr continues to improve LFTs: ALT elevated,  Tbili = 1.5 (1/6) CBGs:  CBGs at goal < 150 mg/dl, except 1 reading of 562 yesterday IVF: NS at kvo GI PPx: Protonix  Chol/Trigs: 94/66 (1/4) Prealbumin: 29.8 (1/4)  Plan:   Continue TNA at goal rate, Clinimix E 5/20 at 115 ml/hr.   IV Lipids MWF d/t backorder.    Continue insulin 30 units/bag and monitor CBGs   Standard multivitamins and trace elements (MWF only due to ongoing shortage).  IVF per Md.  TNA lab panels on Mondays & Thursdays.  Electrolyte abnormalities addressed earlier today - f/u BMET and Mg level in am  F/u plan for removal of central lines   Darrol Angel, PharmD Pager: [phone] 12/25/2012 11:41 AM

## 2012-12-25 NOTE — Progress Notes (Signed)
ANTIBIOTIC CONSULT NOTE - FOLLOW UP  Pharmacy Consult for Vancomycin Indication: MRSA Bacteremia   Allergies  Allergen Reactions  . Percocet (Oxycodone-Acetaminophen)     hallucination    Patient Measurements: Height: 6\' 4"  (193 cm) Weight: 204 lb 5.9 oz (92.7 kg) IBW/kg (Calculated) : 86.8  Adjusted Body Weight:   Vital Signs: Temp: 97.3 F (36.3 C) (01/09 1622) Temp src: Oral (01/09 1622) BP: 116/78 mmHg (01/09 1622) Pulse Rate: 67  (01/09 1622) Intake/Output from previous day: 01/08 0701 - 01/09 0700 In: 4410 [I.V.:980; IV Piggyback:850; TPN:2580] Out: 3985 [Urine:1500; Emesis/NG output:2025; Drains:110; Stool:350] Intake/Output from this shift: Total I/O In: 1750 [I.V.:100; IV Piggyback:650; TPN:1000] Out: 1526 [Urine:675; Emesis/NG output:700; Drains:113; Stool:38]  Labs:  Basename 12/25/12 0500 12/24/12 1645 12/24/12 0400 12/23/12 0445  WBC -- 4.6 3.8* --  HGB -- 7.8* 7.0* --  PLT -- 140* 108* --  LABCREA -- -- -- --  CREATININE 0.64 -- 0.79 1.18   Estimated Creatinine Clearance: 126.6 ml/min (by C-G formula based on Cr of 0.64).  Basename 12/25/12 1730 12/24/12 0935  VANCOTROUGH 17.2 11.8  VANCOPEAK -- --  VANCORANDOM -- --  GENTTROUGH -- --  GENTPEAK -- --  GENTRANDOM -- --  TOBRATROUGH -- --  TOBRAPEAK -- --  TOBRARND -- --  AMIKACINPEAK -- --  AMIKACINTROU -- --  AMIKACIN -- --     Microbiology: Recent Results (from the past 720 hour(s))  CULTURE, BLOOD (ROUTINE X 2)     Status: Normal   Collection Time   12/22/12 12:55 PM      Component Value Range Status Comment   Specimen Description BLOOD LEFT HAND   Final    Special Requests BOTTLES DRAWN AEROBIC AND ANAEROBIC [facility]   Final    Culture  Setup Time 12/22/2012 22:48   Final    Culture     Final    Value: METHICILLIN RESISTANT STAPHYLOCOCCUS AUREUS     Note: RIFAMPIN AND GENTAMICIN SHOULD NOT BE USED AS SINGLE DRUGS FOR TREATMENT OF STAPH INFECTIONS. CRITICAL RESULT CALLED TO, READ BACK BY  AND VERIFIED WITH: JENNIFER FUQUAY @ 1258 12/24/12 BY KRAWS     Note: Gram Stain Report Called to,Read Back By and Verified With: Burnell Blanks @ 1254 12/23/12 BY KRAWS   Report Status 12/25/2012 FINAL   Final    Organism ID, Bacteria METHICILLIN RESISTANT STAPHYLOCOCCUS AUREUS   Final   CULTURE, BLOOD (ROUTINE X 2)     Status: Normal   Collection Time   12/22/12  1:05 PM      Component Value Range Status Comment   Specimen Description BLOOD LEFT HAND   Final    Special Requests BOTTLES DRAWN AEROBIC AND ANAEROBIC [facility]   Final    Culture  Setup Time 12/22/2012 22:48   Final    Culture     Final    Value: STAPHYLOCOCCUS AUREUS     Note: SUSCEPTIBILITIES PERFORMED ON PREVIOUS CULTURE WITHIN THE LAST 5 DAYS.     Note: Gram Stain Report Called to,Read Back By and Verified With: KIM Richardson Dopp @ 1254 12/23/12 BY KRAWS   Report Status 12/25/2012 FINAL   Final   MRSA PCR SCREENING     Status: Abnormal   Collection Time   12/22/12  5:42 PM      Component Value Range Status Comment   MRSA by PCR POSITIVE (*) NEGATIVE Final     Anti-infectives     Start     Dose/Rate Route Frequency Ordered Stop  12/24/12 1000   vancomycin (VANCOCIN) IVPB 1000 mg/200 mL premix        1,000 mg 200 mL/hr over 60 Minutes Intravenous Every 8 hours 12/24/12 0836     12/23/12 2200   vancomycin (VANCOCIN) IVPB 1000 mg/200 mL premix  Status:  Discontinued        1,000 mg 200 mL/hr over 60 Minutes Intravenous Every 12 hours 12/23/12 1040 12/24/12 0836   12/22/12 1600   vancomycin (VANCOCIN) IVPB 1000 mg/200 mL premix  Status:  Discontinued        1,000 mg 200 mL/hr over 60 Minutes Intravenous Every 8 hours 12/22/12 1539 12/23/12 1040   12/22/12 1600   piperacillin-tazobactam (ZOSYN) IVPB 3.375 g  Status:  Discontinued        3.375 g 12.5 mL/hr over 240 Minutes Intravenous Every 8 hours 12/22/12 1539 12/24/12 1441          Assessment:  57 YO M with h/o terminal peritoneal carcinomatosis s/p chemo. Pt developed hypotension and  tachycardia 1/6 with possible sepsis (cholangitis after biliary drain manipulation)    12/22/12 blood cultures now growing MRSA x2  Vancomycin trough= 17.2  Drawn prior to 5th dose today on q 8 hour interval.  Goal of Therapy:   Vancomycin trough level 15-20 mcg/ml Eradication of infection Plan:  1. ) Continue with Vancomycin 1Gm IV q 8 hours 2.)  Monitor renal function,patient progress Loletta Specter 12/25/2012,6:39 PM

## 2012-12-25 NOTE — Progress Notes (Addendum)
He would like NG out but we are unsure of patency of proximal small bowel as well as function of stomach.  We can try to remove the NG and see how he does clinically and if nauseated and vomiting then replace NG.  We could also get UGI to evaluate the patency of stent and to evaluate the emptying of the stomach.  If emptying okay, then trial removing tube and trial liquid diet. We would be happy to assist with removal of mediport if okay with his oncologist and surgical oncologist, Dr. [name].

## 2012-12-25 NOTE — Progress Notes (Signed)
NUTRITION FOLLOW UP  Intervention:   1.  Parenteral nutrition; continued management per PharmD.  Long-term nutrition support use to be discussed in GOC meeting.  Nutrition Dx:   Inadequate oral intake, ongoing  Monitor:   1.  Parenteral nutrition; continued tolerance. 2.  Wt/wt change; monitor trends 3.  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 Pt has been on TPN since September 2013.  Patient is receiving TPN with Clinimix E 5/20 @ 115 ml/hr.  Lipids (20% IVFE @ 10 ml/hr), multivitamins, and trace elements are provided 3 times weekly (MWF) due to national backorder.  Provides 2635 kcal and 138 grams protein daily (based on weekly average).  Meets 100% minimum estimated kcal and 100% minimum estimated protein needs.  Per MD note pt reportedly wants NGT removed, however due to SBO persisting, may not tolerate well at this time.  RD to monitor for removal if occurs and diet advancement.  Current plan is continued aggressive care.  Pt may undergo removal of PICC and Port-A-Cath with 49-72 hr line holiday due to possible MRSA bacteremia.    Additional IVF with NS @ KVO.   Height: Ht Readings from Last 1 Encounters:  12/19/12 6\' 4"  (1.93 m)    Weight Status:   Wt Readings from Last 1 Encounters:  12/24/12 204 lb 5.9 oz (92.7 kg)    Re-estimated needs:  Kcal: 2600-2800 Protein: 130-140g Fluid: >2.5 L/day  Skin: intact  Diet Order: NPO   Intake/Output Summary (Last 24 hours) at 12/25/12 1040 Last data filed at 12/25/12 1021  Gross per 24 hour  Intake   4476 ml  Output   4335 ml  Net    141 ml    Last BM: 1/7   Labs:   Lab 12/25/12 0500 12/24/12 0400 12/23/12 0445 12/22/12 0920  NA 134* 134* 132* --  K 3.4* 3.1* 3.8 --  CL 104 106 106 --  CO2 21 20 19  --  BUN 18 23 45* --  CREATININE 0.64 0.79 1.18 --    CALCIUM 9.3 9.0 8.2* --  MG 1.3* 1.5 1.3* --  PHOS 4.3 3.3 -- 4.1  GLUCOSE 126* 123* 148* --    CBG (last 3)   Basename 12/25/12 0609 12/25/12 0316 12/24/12 2358  GLUCAP 130* 137* 140*    Scheduled Meds:   . antiseptic oral rinse  15 mL Mouth Rinse BID  . Chlorhexidine Gluconate Cloth  6 each Topical Q0600  . enoxaparin (LOVENOX) injection  40 mg Subcutaneous Q24H  . insulin aspart  0-9 Units Subcutaneous Q6H  . lip balm  1 application Topical BID  . magnesium sulfate 1 - 4 g bolus IVPB  2 g Intravenous Once  . metoCLOPramide  10 mg Intravenous QID  . mupirocin ointment  1 application Nasal BID  . pantoprazole  40 mg Intravenous Q12H  . potassium chloride  10 mEq Intravenous Q1 Hr x 4  . vancomycin  1,000 mg Intravenous Q8H    Continuous Infusions:   . sodium chloride 10 mL/hr at 12/24/12 1441  . TPN [facility]) +/- additives 115 mL/hr at 12/24/12 1711   And  . fat emulsion 250 mL (12/24/12 1710)    Loyce Dys, MS RD LDN Clinical Inpatient Dietitian Pager: [phone] Weekend/After hours pager: [phone]

## 2012-12-26 LAB — CBC
HCT: 24.3 % — ABNORMAL LOW (ref 39.0–52.0)
Hemoglobin: 7.8 g/dL — ABNORMAL LOW (ref 13.0–17.0)
MCH: 27.1 pg (ref 26.0–34.0)
MCHC: 32.1 g/dL (ref 30.0–36.0)

## 2012-12-26 LAB — BASIC METABOLIC PANEL
BUN: 17 mg/dL (ref 6–23)
CO2: 23 mEq/L (ref 19–32)
Chloride: 102 mEq/L (ref 96–112)
Creatinine, Ser: 0.62 mg/dL (ref 0.50–1.35)

## 2012-12-26 LAB — GLUCOSE, CAPILLARY
Glucose-Capillary: 127 mg/dL — ABNORMAL HIGH (ref 70–99)
Glucose-Capillary: 143 mg/dL — ABNORMAL HIGH (ref 70–99)
Glucose-Capillary: 144 mg/dL — ABNORMAL HIGH (ref 70–99)

## 2012-12-26 MED ORDER — FAT EMULSION 20 % IV EMUL
240.0000 mL | INTRAVENOUS | Status: AC
  Administered 2012-12-26: 240 mL via INTRAVENOUS
  Filled 2012-12-26: qty 250

## 2012-12-26 MED ORDER — MAGNESIUM SULFATE 40 MG/ML IJ SOLN
2.0000 g | Freq: Once | INTRAMUSCULAR | Status: AC
  Administered 2012-12-26: 2 g via INTRAVENOUS
  Filled 2012-12-26: qty 50

## 2012-12-26 MED ORDER — POTASSIUM CHLORIDE 10 MEQ/100ML IV SOLN
10.0000 meq | INTRAVENOUS | Status: AC
  Administered 2012-12-26 (×2): 10 meq via INTRAVENOUS
  Filled 2012-12-26 (×2): qty 100

## 2012-12-26 MED ORDER — TRACE MINERALS CR-CU-F-FE-I-MN-MO-SE-ZN IV SOLN
INTRAVENOUS | Status: AC
  Administered 2012-12-26: 17:00:00 via INTRAVENOUS
  Filled 2012-12-26: qty 2760

## 2012-12-26 NOTE — Progress Notes (Addendum)
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 Percocet (Oxycodone-Acetaminophen)     hallucination   Patient Measurements: Height: 6\' 4"  (193 cm) Weight: 204 lb 5.9 oz (92.7 kg) IBW/kg (Calculated) : 86.8   Vital Signs: Temp: 98.1 F (36.7 C) (01/10 1610) Temp src: Other (Comment) (01/10 9604) BP: 110/72 mmHg (01/10 5409) Pulse Rate: 71  (01/10 0614) Intake/Output from previous day: 01/09 0701 - 01/10 0700 In: 2950 [I.V.:100; IV Piggyback:850; TPN:2000] Out: 4676 [Urine:1725; Emesis/NG output:2300; Drains:463; Stool:188]  Labs:  Basename 12/26/12 0530 12/24/12 1645 12/24/12 0400  WBC 4.1 4.6 3.8*  HGB 7.8* 7.8* 7.0*  HCT 24.3* 23.2* 20.7*  PLT 155 140* 108*  APTT -- -- --  INR -- -- --     Basename 12/26/12 0530 12/25/12 0500 12/24/12 0400  NA 133* 134* 134*  K 3.7 3.4* 3.1*  CL 102 104 106  CO2 23 21 20   GLUCOSE 122* 126* 123*  BUN 17 18 23   CREATININE 0.62 0.64 0.79  LABCREA -- -- --  CREAT24HRUR -- -- --  CALCIUM 9.5 9.3 9.0  MG 1.4* 1.3* 1.5  PHOS -- 4.3 3.3  PROT -- 5.2* --  ALBUMIN -- 1.9* --  AST -- 34 --  ALT -- 63* --  ALKPHOS -- 449* --  BILITOT -- 1.2 --  BILIDIR -- -- --  IBILI -- -- --  PREALBUMIN -- -- --  TRIG -- -- --  CHOLHDL -- -- --  CHOL -- -- --   Estimated Creatinine Clearance: 126.6 ml/min (by C-G formula based on Cr of 0.62).   Medications:  Scheduled:     . antiseptic oral rinse  15 mL Mouth Rinse BID  . Chlorhexidine Gluconate Cloth  6 each Topical Q0600  . enoxaparin (LOVENOX) injection  40 mg Subcutaneous Q24H  . insulin aspart  0-9 Units Subcutaneous Q6H  . lip balm  1 application Topical BID  . [COMPLETED] magnesium sulfate 1 - 4 g bolus IVPB  2 g Intravenous Once  . metoCLOPramide  10 mg Intravenous QID  . mupirocin ointment  1 application Nasal BID  . pantoprazole  40 mg Intravenous Q12H  .  [COMPLETED] potassium chloride  10 mEq Intravenous Q1 Hr x 4  . sodium chloride  10-40 mL Intracatheter Q12H  . vancomycin  1,000 mg Intravenous Q8H   Infusions:     . sodium chloride 10 mL/hr at 12/24/12 1441  . [EXPIRED] TPN (CLINIMIX) +/- additives 115 mL/hr at 12/24/12 1711   And  . [EXPIRED] fat emulsion 250 mL (12/24/12 1710)  . TPN (CLINIMIX) +/- additives 115 mL/hr at 12/25/12 1726   Insulin Requirements in the past 24 hours:  CBG 89-130 3 units Novolog SSI 30 units regular insulin in TPN bag Per [facility] med list, patient on SSI and regular insulin in TPN (44.6 units)  Nutritional Goals:  RD recs (12/21/12):  Kcal: 2600-2800, Protein: 130-140 gm, Fluid: >2.6L/day Continuing approx same formula as LTACH  Current Nutrition:   Goal rate:  Clinimix E5/20 at 134ml/hr + Lipids MWF to provide Protein 138gm and 2909 kcal MWF, 2429 Kcal TTSS for avg of 2634 kcal  TPN PTA provide 2624 Kcals, 140g Protein, fluid (125 ml/hr over 24 hrs)  NPO (as of 12/20/12)  Assessment:  56 YOM with complex history including terminal peritoneal carcinomatosis s/p chemo.  Duodenal stent placed on12/27 but CT on 12/31 showed  stent stenosis.  Pt with LLQ drain, LUQ drain, ileostomy, choly drain, NG tube.    Pt transferred [facility] on 12/20[facility]11 from [facility] for SBO management, N/V, high NG output.  No further plans for chemotherapy or surgery at this time.  Pt developed hypotension and tachycardia and transferred to ICU on 1/6 with possible sepsis (cholangitis after biliary drain manipulation)  12/23/11 blood cultures now positive for MRSA.   Spoke with Dr. Waymon Amato today - he'd like TNA to be ordered for tonight as ongoing discussion about central access (PICC, Port) is coordinated between multiple different MDs at different facilities [facility])  Labs:   Na remains low (133), cannot adjust with Clinimix TNA  Potassium 3.7 after supplementing with 40 mEq KCL on 1/9,  will give additional supp today  Magnesium remains low (1.4) after 2 gram supplement 1/9, will repeat  Renal:  SCr continues to improve LFTs: ALT elevated, Tbili improved to 1.2 on 1/9.  Alk phos increased to 449 (1/10) < 290 (1/6) CBGs:  CBGs at goal < 150 mg/dl IVF: NS at kvo GI PPx: Protonix  Chol/Trigs: 115/66 Prealbumin: 29.8 (1/4)  Plan:   Continue TNA at goal rate, Clinimix E 5/20 at 115 ml/hr.   F/u plan for removal of central lines  Will Continue TNA for tonight as long term plan of care is being determined    Potassium 20 mEq IV today   Magnesium 2 grams IV x 1 today   IV Lipids MWF d/t backorder.    Continue insulin 30 units/bag and monitor CBGs   Standard multivitamins and trace elements (MWF only due to ongoing shortage).  IVF per Md.  TNA lab panels on Mondays & Thursdays  Alcide Memoli, Loma Messing PharmD Pager #: [phone] 9:11 AM 12/26/2012

## 2012-12-26 NOTE — Progress Notes (Signed)
Subjective: He is in bed and feels OK this AM.  We discussed the UGI, showing very little contrast going thru the stent.  Dr. [name] is going to check with Dr. [name] at [facility], and ask about JP.    Objective: Vital signs in last 24 hours: Temp:  [97.3 F (36.3 C)-98.6 F (37 C)] 98.1 F (36.7 C) (01/10 0614) Pulse Rate:  [63-71] 71  (01/10 0614) Resp:  [20-30] 20  (01/10 0614) BP: (110-124)/(69-79) 110/72 mmHg (01/10 0614) SpO2:  [98 %-100 %] 100 % (01/10 0614) Last BM Date: 12/23/12  NG: 2300 ml recorded, Drains: Biliary: 0, JP: 3 ml, Eakins: 460 ml Afebrile, VSS, Labs stable, UGI shows: Mild dilatation of the duodenum with very little contrast seen entering the duodenal stent.    Intake/Output from previous day: 01/09 0701 - 01/10 0700 In: 2950 [I.V.:100; IV Piggyback:850; TPN:2000] Out: 4676 [Urine:1725; Emesis/NG output:2300; Drains:463; Stool:188] Intake/Output this shift:    General appearance: alert, cooperative and no distress GI: soft, ileostomy, eakins pouch over fistula, cholecystostomy tube, and JP are as they have been no real change.  NG drainage is green..  No distension or pain.  Lab Results:   Basename 12/26/12 0530 12/24/12 1645  WBC 4.1 4.6  HGB 7.8* 7.8*  HCT 24.3* 23.2*  PLT 155 140*    BMET  Basename 12/26/12 0530 12/25/12 0500  NA 133* 134*  K 3.7 3.4*  CL 102 104  CO2 23 21  GLUCOSE 122* 126*  BUN 17 18  CREATININE 0.62 0.64  CALCIUM 9.5 9.3   PT/INR No results found for this basename: LABPROT:2,INR:2 in the last 72 hours   Lab 12/25/12 0500 12/22/12 0920 12/21/12 0640 12/20/12 0420  AST 34 66* 127* 88*  ALT 63* 115* 157* 96*  ALKPHOS 449* 290* 269* 270*  BILITOT 1.2 1.5* 1.9* 1.2  PROT 5.2* 6.7 6.5 6.8  ALBUMIN 1.9* 2.5* 2.6* 2.7*     Lipase     Component Value Date/Time   LIPASE 35 11/07/2011 1734     Studies/Results: Dg Abd Portable 1v  12/25/2012  *RADIOLOGY REPORT*  Clinical Data: NG tube placement  PORTABLE  ABDOMEN - 1 VIEW  Comparison: 12/15/2012  Findings: Enteric stent remains in place.  Surgical drain along the left side of abdomen.  Cholecystostomy tube projects over the gallbladder lumen.  NG tube placed with its tip in the fundus of the stomach.  No obvious free intraperitoneal gas.  No disproportionate dilatation of bowel.  IMPRESSION: NG tube tip is in the fundus of the stomach.   Original Report Authenticated By: Jolaine Click, M.D.    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  UPPER GI SERIES WITH KUB  Technique:  Routine upper GI series was performed with Omnipaque- 300  Fluoroscopy Time: 1.47 minutes  Comparison:  CT scan 12/16/2012  Findings: Contrast was infused via the NG tube in the stomach. Stomach is unremarkable.  Very little peristaltic action was demonstrated but contrast did enter into the duodenum with the patient on the right side and elevated.  Occasional GE reflux was noted.  There was to and fro movement of contrast in the second portion of the duodenum which was mildly dilated.  Very little contrast entered the duodenal stent but a small amount did pass through it.  Very little contrast was seen beyond the stent.  IMPRESSION:  1.  Mild dilatation of the duodenum  with very little contrast seen entering the duodenal stent. 2.  No dilated distal small bowel to suggest obstruction.   Original Report Authenticated By: Rudie Meyer, M.D.     Medications:    . antiseptic oral rinse  15 mL Mouth Rinse BID  . Chlorhexidine Gluconate Cloth  6 each Topical Q0600  . enoxaparin (LOVENOX) injection  40 mg Subcutaneous Q24H  . insulin aspart  0-9 Units Subcutaneous Q6H  . lip balm  1 application Topical BID  . metoCLOPramide  10 mg Intravenous QID  . mupirocin ointment  1 application Nasal BID  . pantoprazole  40 mg Intravenous Q12H  . sodium chloride  10-40 mL Intracatheter Q12H  .  vancomycin  1,000 mg Intravenous Q8H    Assessment/Plan Septic Shock secondary to MRSA bacteremia-? Line sepsis (has PICC line and Port-A-Cath):  Cholecystitis status post cholecystostomy drain placed at [facility] - IR performed cholecystostomy tube change on 1/6.  Metastatic Adenocarcinoma with signet ring feature, abdominal carcinomatosis, with debulking surgery 12/2011 and 9/2013with post op complications, requiring drainage, and JP drains.  Small bowel obstruction  Duodenal obstruction with Duodenal stent 12/27 by Dr. Arlyce Dice  LLQ drain, LUQ drain, ileostomy, Cholecystostomy drain, and now with NG.  MRSA positive blood culture  [facility].  Hypertension  GERD (gastroesophageal reflux disease)  Anemia  Plan:  We discussed removal of the NG would most likely end up with a good deal of nausea and vomiting since so little is going thru. We also talked about the difficulty of trying to get some type of gastrostomy tube to decompress his stomach, in place of the NG.  Dr. Biagio Quint will see and talk with him later today too.       LOS: 7 days    [name],[name] 12/26/2012  I have talked with the patient and discussed options for trial of NG clamping or removal to see how he feels.  If he is only vomiting occasionally, then he may not need NG or PEG. A surgical feeding tube would be very difficult to place with his fistulas and prior surgery.  He has had a prior gastrostomy tube and so his stomach should be stuck to the abdominal wall from this and so PEG or radiologic placement may be possible.  I think that it would be reasonable to try clamping or removal to see how symptomatic he is without suction on NG.

## 2012-12-26 NOTE — Progress Notes (Signed)
Physical Therapy Treatment Patient Details Name: [name] MRN: [id number] DOB: December 13, 1956 Today's Date: 12/26/2012 Time: 7829-5621 PT Time Calculation (min): 26 min  PT Assessment / Plan / Recommendation Comments on Treatment Session  Pt tolerated increase in distanc today. Pt. very motivated.    Follow Up Recommendations  SNF;LTACH     Does the patient have the potential to tolerate intense rehabilitation     Barriers to Discharge        Equipment Recommendations  Rolling walker with 5" wheels    Recommendations for Other Services    Frequency Min 3X/week   Plan Discharge plan remains appropriate;Frequency remains appropriate    Precautions / Restrictions Precautions Precautions: Fall Precaution Comments: Multiple lines/drains    Pertinent Vitals/Pain Has cramp r foot    Mobility  Bed Mobility Supine to Sit: 4: Min assist Transfers Sit to Stand: 1: +2 Total assist;From bed Sit to Stand: Patient Percentage: 70% Stand to Sit: 4: Min assist Details for Transfer Assistance: Assist to rise and steady with cues for hand placement, safety  Ambulation/Gait Ambulation/Gait Assistance: 1: +2 Total assist Ambulation/Gait: Patient Percentage: 70% Ambulation Distance (Feet): 35 Feet Assistive device: Rolling walker Ambulation/Gait Assistance Details: cues for posture inside RW Gait Pattern: Step-through pattern;Decreased stance time - right;Decreased stride length    Exercises     PT Diagnosis:    PT Problem List:   PT Treatment Interventions:     PT Goals Acute Rehab PT Goals Pt will go Supine/Side to Sit: with supervision PT Goal: Supine/Side to Sit - Progress: Progressing toward goal Pt will go Sit to Stand: with supervision PT Goal: Sit to Stand - Progress: Progressing toward goal Pt will go Stand to Sit: with supervision PT Goal: Stand to Sit - Progress: Progressing toward goal Pt will Ambulate: 16 - 50 feet;with mod assist;with least restrictive  assistive device PT Goal: Ambulate - Progress: Progressing toward goal  Visit Information  Last PT Received On: 12/26/12 Assistance Needed: +2    Subjective Data  Subjective: I am so proud to walk. TThe cramp is what limited me.   Cognition  Overall Cognitive Status: Appears within functional limits for tasks assessed/performed    Balance     End of Session PT - End of Session Activity Tolerance: Patient tolerated treatment well Patient left: in chair;with call bell/phone within reach Nurse Communication: Mobility status   GP     Rada Hay 12/26/2012, 4:45 PM

## 2012-12-26 NOTE — Progress Notes (Signed)
Patient XB:MWUXLKG DMANI MIZER      DOB: September 11, 1956      MWN:[id number]   Palliative Medicine Team at [facility] Progress Note    Subjective: Patient underwent upper GI study this afternoon. I returned to see him this evening after communicating with Dr. [name]. Dr. [name] expressed willingness to be available to the patient if he desires transition back to [facility] for care related to his port. Dr. [name] was kind enough to share that he has told Harsha that there are no further surgical interventions in his abdomen that could be performed. I reviewed this with Jaiel who does admit that he had no intentions of going back to [place] at this time but still expected that we attempt to stabilize and treat his current conditions if possible. He allows me to open door of returning to communicate in a stepwise fashion regarding his care. I shared with him the results of his upper GI study that there was very little contrast even threading its way through his stent which would mean that he could not safely remove the NG tube or eat. We agreed that we would communicate further with the surgical team in the morning to explore options. I'm wondering if a venting gastrostomy tube could be placed in order to promote comfort and dignity in the NG tube removed. I will communicate with the surgeons in the a.m. and asked this question. We will also further clarify the need for continued drains in his abdomen I explained to him that the cholecystostomy tube likely needs to be maintained to prevent further infection. He is starting to understand this concept but is still struggling with the fact that none of the drains have significant output. He is allowing me to further work through goals of care with him which may involve a discharge to home with home health and TPN for a period of time until which he is forced to make further comfort-based decisions.     Filed Vitals:   12/26/12 0614  BP: 110/72  Pulse: 71  Temp:  98.1 F (36.7 C)  Resp: 20   Physical exam:  Gen. no acute distress Pupils are equal round reactive to light Extraocular muscles are intact Because membranes are moist Chest is decreased but clear to auscultation his blockers or wheezes Right upper quadrant drain is intact with minimal brownish fluid Bilateral ostomy bags comfort visualized bowel Extremities are warm without edema or mottling. Neurologically the patient is awake alert oriented cranial nerves 2-12 are intact      Assessment and plan:  57 year old african [place] male with extensive carcinomatosis of abdomen related adenocarcinoma possibly of gastric primary.  Patient has had extensive interventions for obstruction including multiple surgeries, and duodenal stent placement.  Patient's goals are summarized as trying to treat current MRSA infection,  Pursue options for nutrition via enteral route and maintaining comfort and dignity.  He has expressed a desire to continue to rehab as long as he can.  I will talk with GI in the am regarding further options.  Ideally we would like to be able to feed enterally and vent is gastric contents to allow him to maintain his quality of life for as long as he can.  He gave me a window of opportunity to talk about what full comfort care might involve.     1.  Full Code 2.  Nutrition : continuing TPN  3.  MRSA bacteremia:  Patient maintaining current PICC and port  With intensive  IV abx therapy until we can plan an alternative way to obtain nutrition.  Patient and family aware of the risks for seeding heart and recurrent sepsis.  Trying to get a way to provide enteral nutrition while picc and port unavailable.  Continuing discussions with surgery and GI  Total time 15 min  Neema Fluegge L. [name], MD MBA The Palliative Medicine Team at [facility] Phone: [phone] Pager: [phone]

## 2012-12-26 NOTE — Progress Notes (Signed)
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 [name] in [facility] which has left him with multiple drains and fistulas. He also had recent cholecystitis. [facility]. 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 A duodenal stent was placed by GI during second attempt. He however continued to be obstructed. He was transferred to [facility] on 1/3 for evaluation and management. Since hospitalization, he has continued to have copious NG tube drainage. Surgeons and GI do not see any surgical/intervention options. Oncology does not recommend any further chemotherapy at this time. Patient in the interim developed septic shock from MRSA bacteremia/likely line sepsis. Palliative team consulted for goals of care-patient wishes to continue aggressive course.  Assessment/Plan: 1. Septic Shock secondary to MRSA bacteremia-? Line sepsis (has PICC line and Port-A-Cath): Less likely from Cholangitis. Patient was transferred to ICU a couple of days ago for hypotension and tachycardia. He was empirically placed on vancomycin and Zosyn. Zosyn has since then been discontinued. His blood pressure stabilized without pressors. Critical care medicine consulted and signed off. His blood cultures shows MRSA in 2 bottles. Patient has opted continued aggressive care. Discussed with ID-recommend removing both PICC line and Port-A-Cath simultaneously followed by 48-72 hours of line holiday, repeating surveillance blood cultures after lines are out and if negative then placed new PICC line. Discussed with patient that in the  absence of central line and current n.p.o. status, he will be without nutrition for 48-72 hours when the current PICC line and Port-A-Cath are removed. He will only get IV fluids and antibiotics via peripheral line. He wishes to wait until further clarification as to enteral feeding. Advised him of the risk of worsening sepsis, endocarditis and even death. He verbalizes understanding. Dr. [name] discussed with GI who do not think PEG is an option. Surgical feeding tube would be very difficult to place due to complicated anatomy from multiple surgeries. NG tube still draining copious amounts. 2. Pancytopenia: Leukopenia and thrombocytopenia have resolved. Anemia is stable.? Secondary to sepsis. Follow CBC closely. 3. Hypokalemia and hypomagnesemia: Pharmacy repeating IV potassium and magnesium. Follow BMP. 4. 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 Patient has had multiple surgical procedures (refer to surgeon's notes). Currently Surgery and GI does not have any further interventions to offer. No further interventions planned. Palliative care team met with patient and extended family on 1/8 and patient wishes to pursue further cancer treatment possibly at [facility]. [name] assisting with coordination-much appreciated. 5. Small bowel obstruction/duodenal obstruction despite stent: Currently conservative management-n.p.o., NG tube and TNA. GI, IR and general surgery R. consulting-do not see a role for any further aggressive intervention. Please see discussion in problem #1  6. Cholecystitis status post cholecystostomy drain placed at [facility] - IR performed cholecystostomy tube change on 1/6.  Code Status: Full Family Communication: Discussed with patient. Disposition Plan: To be determined   Consultants:  Oncology   [facility]   IR  General surgery.  Critical care  medicine.  Palliative care medicine  ID  Procedures:  NG tube  Change of cholecystostomy tube by IR on 1/6  Antibiotics:  IV vancomycin  1/6 >  IV Zosyn 1/6 > 1/8  HPI/Subjective: No further nasal bleeding. Denies any other complaints.  Objective: Filed Vitals:   12/25/12 1622 12/25/12 2141 12/26/12 0614 12/26/12 1509  BP: 116/78 124/79 110/72 119/79  Pulse: 67 69 71 70  Temp: 97.3 F (36.3 C) 97.9 F (36.6 C) 98.1 F (36.7 C) 97.9 F (36.6 C)  TempSrc: Oral Oral Other (Comment) Oral  Resp: 20 20 20 18   Height:      Weight:      SpO2: 100% 98% 100% 99%    Intake/Output Summary (Last 24 hours) at 12/26/12 1846 Last data filed at 12/26/12 1358  Gross per 24 hour  Intake   4493 ml  Output   4650 ml  Net   -157 ml   Filed Weights   12/19/12 1646 12/23/12 0315 12/24/12 0400  Weight: 92.534 kg (204 lb) 93.5 kg (206 lb 2.1 oz) 92.7 kg (204 lb 5.9 oz)    Exam:   General exam: Comfortable and pleasant.   Respiratory system: Clear to auscultation. No increased work of breathing.  Cardiovascular system: First and second heart sounds heard, regular rate and rhythm. No JVD, murmurs or pedal edema.   Gastroenterology: Abdomen is nondistended, soft and nontender. RLQ ostomy. RUQ cholecystostomy drain. Upper midline Eakin's  pouch. LLQ drain. NGT  Central nervous system: Alert and oriented. No focal neurological deficits.  Extremities: Symmetric 5 x 5 power.  ENT: no epistaxis. Right NG tube  Data Reviewed: Basic Metabolic Panel:  Lab 12/26/12 1478 12/25/12 0500 12/24/12 0400 12/23/12 0445 12/22/12 0920 12/21/12 0640 12/20/12 0420  NA 133* 134* 134* 132* 130* -- --  K 3.7 3.4* 3.1* 3.8 4.2 -- --  CL 102 104 106 106 95* -- --  CO2 23 21 20 19 28  -- --  GLUCOSE 122* 126* 123* 148* 164* -- --  BUN 17 18 23  45* 38* -- --  CREATININE 0.62 0.64 0.79 1.18 0.64 -- --  CALCIUM 9.5 9.3 9.0 8.2* 10.5 -- --  MG 1.4* 1.3* 1.5 1.3* 1.5 -- --  PHOS -- 4.3 3.3 -- 4.1  4.2 4.7*   Liver Function Tests:  Lab 12/25/12 0500 12/22/12 0920 12/21/12 0640 12/20/12 0420  AST 34 66* 127* 88*  ALT 63* 115* 157* 96*  ALKPHOS 449* 290* 269* 270*  BILITOT 1.2 1.5* 1.9* 1.2  PROT 5.2* 6.7 6.5 6.8  ALBUMIN 1.9* 2.5* 2.6* 2.7*   No results found for this basename: LIPASE:5,AMYLASE:5 in the last 168 hours No results found for this basename: AMMONIA:5 in the last 168 hours CBC:  Lab 12/26/12 0530 12/24/12 1645 12/24/12 0400 12/22/12 0920 12/20/12 0420  WBC 4.1 4.6 3.8* 7.3 6.0  NEUTROABS -- -- -- 4.3 3.5  HGB 7.8* 7.8* 7.0* 10.2* 10.8*  HCT 24.3* 23.2* 20.7* 30.3* 32.4*  MCV 84.4 83.8 84.5 83.7 84.4  PLT 155 140* 108* 201 223   Cardiac Enzymes: No results found for this basename: CKTOTAL:5,CKMB:5,CKMBINDEX:5,TROPONINI:5 in the last 168 hours BNP (last 3 results)  Basename 12/22/12 1700  PROBNP 33.8   CBG:  Lab 12/26/12 1813 12/26/12 1217 12/26/12 0612 12/26/12 12/25/12 1754  GLUCAP 144* 143* 127* 127* 130*    Recent Results (from the past 240 hour(s))  CULTURE, BLOOD (ROUTINE X 2)     Status: Normal   Collection Time   12/22/12 12:55 PM      Component Value Range Status Comment   Specimen Description BLOOD LEFT HAND   Final    Special  Requests BOTTLES DRAWN AEROBIC AND ANAEROBIC [facility]   Final    Culture  Setup Time 12/22/2012 22:48   Final    Culture     Final    Value: METHICILLIN RESISTANT STAPHYLOCOCCUS AUREUS     Note: RIFAMPIN AND GENTAMICIN SHOULD NOT BE USED AS SINGLE DRUGS FOR TREATMENT OF STAPH INFECTIONS. CRITICAL RESULT CALLED TO, READ BACK BY AND VERIFIED WITH: JENNIFER FUQUAY @ 1258 12/24/12 BY KRAWS     Note: Gram Stain Report Called to,Read Back By and Verified With: Burnell Blanks @ 1254 12/23/12 BY KRAWS   Report Status 12/25/2012 FINAL   Final    Organism ID, Bacteria METHICILLIN RESISTANT STAPHYLOCOCCUS AUREUS   Final   CULTURE, BLOOD (ROUTINE X 2)     Status: Normal   Collection Time   12/22/12  1:05 PM      Component Value Range Status  Comment   Specimen Description BLOOD LEFT HAND   Final    Special Requests BOTTLES DRAWN AEROBIC AND ANAEROBIC [facility]   Final    Culture  Setup Time 12/22/2012 22:48   Final    Culture     Final    Value: STAPHYLOCOCCUS AUREUS     Note: SUSCEPTIBILITIES PERFORMED ON PREVIOUS CULTURE WITHIN THE LAST 5 DAYS.     Note: Gram Stain Report Called to,Read Back By and Verified With: KIM Richardson Dopp @ 1254 12/23/12 BY KRAWS   Report Status 12/25/2012 FINAL   Final   MRSA PCR SCREENING     Status: Abnormal   Collection Time   12/22/12  5:42 PM      Component Value Range Status Comment   MRSA by PCR POSITIVE (*) NEGATIVE Final      Studies: Dg Abd Portable 1v  12/25/2012  *RADIOLOGY REPORT*  Clinical Data: NG tube placement  PORTABLE ABDOMEN - 1 VIEW  Comparison: 12/15/2012  Findings: Enteric stent remains in place.  Surgical drain along the left side of abdomen.  Cholecystostomy tube projects over the gallbladder lumen.  NG tube placed with its tip in the fundus of the stomach.  No obvious free intraperitoneal gas.  No disproportionate dilatation of bowel.  IMPRESSION: NG tube tip is in the fundus of the stomach.   Original Report Authenticated By: Jolaine Click, M.D.    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  UPPER GI SERIES WITH KUB  Technique:  Routine upper GI series was performed with Omnipaque- 300  Fluoroscopy Time: 1.47 minutes  Comparison:  CT scan 12/16/2012  Findings: Contrast was infused via the NG tube in the stomach. Stomach is unremarkable.  Very little peristaltic action was demonstrated but contrast did enter into the duodenum with the patient on the right side and elevated.  Occasional GE reflux was noted.  There was to and fro movement of contrast in the second portion of the duodenum which was mildly dilated.  Very little contrast entered the duodenal stent but a small amount did pass through it.   Very little contrast was seen beyond the stent.  IMPRESSION:  1.  Mild dilatation of the duodenum with very little contrast seen entering the duodenal stent. 2.  No dilated distal small bowel to suggest obstruction.   Original Report Authenticated By: Rudie Meyer, M.D.     Scheduled Meds:    . antiseptic oral rinse  15 mL Mouth Rinse BID  . Chlorhexidine Gluconate Cloth  6 each  Topical Q0600  . enoxaparin (LOVENOX) injection  40 mg Subcutaneous Q24H  . insulin aspart  0-9 Units Subcutaneous Q6H  . lip balm  1 application Topical BID  . metoCLOPramide  10 mg Intravenous QID  . mupirocin ointment  1 application Nasal BID  . pantoprazole  40 mg Intravenous Q12H  . sodium chloride  10-40 mL Intracatheter Q12H  . vancomycin  1,000 mg Intravenous Q8H   Continuous Infusions:    . sodium chloride 10 mL/hr at 12/24/12 1441  . fat emulsion 240 mL (12/26/12 1703)  . TPN (CLINIMIX) +/- additives 115 mL/hr at 12/26/12 1703    Principal Problem:  *Peritoneal carcinomatosis Active Problems:  ANEMIA  SBO (small bowel obstruction)  Hypokalemia  Nausea and vomiting in adult  Anemia of chronic disease  Colo-enteric fistula  Acute cholecystitis s/p perc draianage [id number]  Fistula of small intestine  Shock  Sepsis  Septic shock due to Staphylococcus aureus  Pancytopenia  Hypomagnesemia     [facility]  [facility] Pager [phone]. If 8PM-8AM, please contact night-coverage at [email or web address], password [facility] 12/26/2012, 6:46 PM  LOS: 7 days

## 2012-12-26 NOTE — Progress Notes (Signed)
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  Specific aspects of clinical management are critical to optimizing the outcome of patients with SAB.  Therefore, the [facility] Antimicrobial Management Team (CHAMP) have initiated an intervention aimed at improving the management of SAB at [facility].  To do so, Infectious Diseases Consultants are providing evidence-based recommendations for the management of all patients with SAB.    At this time, the patient is aware of the need for line removal including the PICC and port a cath, prolonged IV antibiotics, and repeat blood cultures to assure clearance, however he has refused these interventions at this time.  In discussion with the primary team, he is aware of the need to have lines removed and risks of seeding other sites including the heart.    Staci Righter, MD

## 2012-12-26 NOTE — Consult Note (Signed)
WOC ostomy consult  Stoma type/location: Pt states he has had ileostomy for about a year, and was independent with emptying and pouch application when at home.  He states he has been wearing an Eakin pouch to fistula site "for months" and was previously receiving pouch changes at [facility] by ostomy nurse twice a week. Both pouches were leaking behind barrier when removed. Pt states pouches have not been changed in a week. Stomal assessment/size: Right lower quad with ileostomy stoma flush with skin, red and viable, approx 3/4 inch.  Applied one piece flexible pouch since stoma located in deep crease.  Small amt  (50cc) yellow liquid in pouch. Peristomal skin intact. Midline abd with fistula site, opening approx 1inch located in deep valley.  Applied small Eakin pouch to bedside drainage bag.  Mod amt yellow liquid.  Skin intact surrounding fistula site. Education provided:  Reviewed pouching steps with patient for Eakin pouch and ileostomy pouch application, so he can talk the bedside nurses through if leakage occurs. He appears to be well informed regarding application and emptying, but is unable to be independent at this time while ill.  Supplies at bedside for staff use. Recommend pouch changes Q 3-4 days to avoid leakage and maceration to skin.     Cammie Mcgee, RN, MSN, [employer]  [phone]

## 2012-12-27 DIAGNOSIS — D649 Anemia, unspecified: Secondary | ICD-10-CM

## 2012-12-27 DIAGNOSIS — K632 Fistula of intestine: Secondary | ICD-10-CM

## 2012-12-27 LAB — COMPREHENSIVE METABOLIC PANEL
AST: 24 U/L (ref 0–37)
Albumin: 2.4 g/dL — ABNORMAL LOW (ref 3.5–5.2)
Calcium: 9.7 mg/dL (ref 8.4–10.5)
Chloride: 101 mEq/L (ref 96–112)
Creatinine, Ser: 0.64 mg/dL (ref 0.50–1.35)

## 2012-12-27 LAB — GLUCOSE, CAPILLARY
Glucose-Capillary: 135 mg/dL — ABNORMAL HIGH (ref 70–99)
Glucose-Capillary: 134 mg/dL — ABNORMAL HIGH (ref 70–99)
Glucose-Capillary: 130 mg/dL — ABNORMAL HIGH (ref 70–99)

## 2012-12-27 LAB — CBC
MCH: 27.7 pg (ref 26.0–34.0)
MCV: 84.6 fL (ref 78.0–100.0)
Platelets: 205 10*3/uL (ref 150–400)
RDW: 15.9 % — ABNORMAL HIGH (ref 11.5–15.5)
WBC: 6.5 10*3/uL (ref 4.0–10.5)

## 2012-12-27 MED ORDER — INSULIN REGULAR HUMAN 100 UNIT/ML IJ SOLN
INTRAVENOUS | Status: AC
  Administered 2012-12-27: 17:00:00 via INTRAVENOUS
  Filled 2012-12-27: qty 2000

## 2012-12-27 MED ORDER — DEXTROSE 10 % IV SOLN: INTRAVENOUS | Status: AC | PRN

## 2012-12-27 MED ORDER — INSULIN REGULAR HUMAN 100 UNIT/ML IJ SOLN
INTRAVENOUS | Status: DC
  Filled 2012-12-27: qty 2760

## 2012-12-27 NOTE — Progress Notes (Signed)
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 Percocet (Oxycodone-Acetaminophen)     hallucination   Patient Measurements: Height: 6\' 4"  (193 cm) Weight: 204 lb 5.9 oz (92.7 kg) IBW/kg (Calculated) : 86.8   Vital Signs: Temp: 97.5 F (36.4 C) (01/11 0600) Temp src: Axillary (01/11 0600) BP: 121/80 mmHg (01/11 0600) Pulse Rate: 82  (01/11 0600) Intake/Output from previous day: 01/10 0701 - 01/11 0700 In: 6690.2 [I.V.:1214.3; IV Piggyback:800; ZOX:0960.4] Out: 3775 [Urine:1950; Emesis/NG output:1750; Drains:75]  Labs:  [facility] 12/27/12 0430 12/26/12 0530 12/24/12 1645  WBC 6.5 4.1 4.6  HGB 8.6* 7.8* 7.8*  HCT 26.3* 24.3* 23.2*  PLT 205 155 140*  APTT -- -- --  INR -- -- --     Basename 12/27/12 0430 12/26/12 0530 12/25/12 0500  NA 133* 133* 134*  K 3.7 3.7 3.4*  CL 101 102 104  CO2 23 23 21   GLUCOSE 129* 122* 126*  BUN 20 17 18   CREATININE 0.64 0.62 0.64  LABCREA -- -- --  CREAT24HRUR -- -- --  CALCIUM 9.7 9.5 9.3  MG -- 1.4* 1.3*  PHOS -- -- 4.3  PROT 6.2 -- 5.2*  ALBUMIN 2.4* -- 1.9*  AST 24 -- 34  ALT 52 -- 63*  ALKPHOS 519* -- 449*  BILITOT 1.0 -- 1.2  BILIDIR -- -- --  IBILI -- -- --  PREALBUMIN -- -- --  TRIG -- -- --  CHOLHDL -- -- --  CHOL -- -- --   Estimated Creatinine Clearance: 126.6 ml/min (by C-G formula based on Cr of 0.64).   Medications:  Scheduled:     . antiseptic oral rinse  15 mL Mouth Rinse BID  . Chlorhexidine Gluconate Cloth  6 each Topical Q0600  . enoxaparin (LOVENOX) injection  40 mg Subcutaneous Q24H  . insulin aspart  0-9 Units Subcutaneous Q6H  . lip balm  1 application Topical BID  . [COMPLETED] magnesium sulfate 1 - 4 g bolus IVPB  2 g Intravenous Once  . metoCLOPramide  10 mg Intravenous QID  . mupirocin ointment  1 application Nasal BID  . pantoprazole  40 mg Intravenous Q12H  . [COMPLETED]  potassium chloride  10 mEq Intravenous Q1 Hr x 2  . sodium chloride  10-40 mL Intracatheter Q12H  . vancomycin  1,000 mg Intravenous Q8H   Infusions:     . sodium chloride 10 mL/hr at 12/24/12 1441  . fat emulsion 240 mL (12/26/12 1703)  . [EXPIRED] TPN (CLINIMIX) +/- additives 115 mL/hr at 12/25/12 1726  . TPN (CLINIMIX) +/- additives 115 mL/hr at 12/26/12 1703   Insulin Requirements in the past 24 hours:  CBG 124-144 4 units Novolog SSI 30 units regular insulin in TPN bag Per [facility] med list, patient on SSI and regular insulin in TPN (44.6 units)  Nutritional Goals:  RD recs (12/21/12):  Kcal: 2600-2800, Protein: 130-140 gm, Fluid: >2.6L/day Continuing approx same formula as LTACH  Current Nutrition:   Goal rate:  Clinimix E5/20 at 150ml/hr + Lipids MWF to provide Protein 138gm and 2909 kcal MWF, 2429 Kcal TTSS for avg of 2634 kcal  TPN PTA provide 2624 Kcals, 140g Protein, fluid (125 ml/hr over 24 hrs)  NPO (as of 12/20/12)  Assessment:  56 YOM with complex history including terminal peritoneal carcinomatosis s/p chemo.  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  Pt with LLQ  drain, LUQ drain, ileostomy, choly drain, NG tube.    Pt transferred to [facility] on 12/20/11 from [facility] for SBO management, N/V, high NG output.  No further plans for chemotherapy or surgery at this time.  Pt developed hypotension and tachycardia and transferred to ICU on 1/6 with MRSA bacteremia.  ID has recommended line removal, but at this time, the pt elects to continue central lines despite risks.  Continue to follow up plans for PEG or surgically place feeding tube.  Labs:   Na remains low (133), cannot adjust with Clinimix TNA  Potassium 3.7 after supplementing with 40 mEq KCL on 1/9 and 20 mEq KCl on 1/10  Mag is improved to 1.6 after bolus on 1/10, Phos is elevated at 5 today (will reduce TNA rate instead of changing to electrolyte free  formula) Renal:  SCr improved, stable LFTs: ALT/ALT and Tbili wnl (1/11), Alk phos increased to 519 (1/11) CBGs:  CBGs at goal < 150 mg/dl IVF: NS at kvo GI PPx: Protonix  Chol/Trigs: 115/66 Prealbumin: 29.8 (1/4)  Plan:   Continue Clinimix E 5/20 with reduced rate of 83 ml/hr.   F/u plan for removal of central lines  Will Continue TNA for tonight as long term plan of care is being determined    Follow up Phos, Mag, Potassium   IV Lipids MWF d/t backorder.    Decrease insulin 20 units/bag and monitor CBGs   Standard multivitamins and trace elements (MWF only due to ongoing shortage).  IVF per Md.  TNA lab panels on Mondays & Thursdays  BMET, Mag, Phos tomorrow.  Lynann Beaver PharmD, BCPS Pager [phone] 12/27/2012 9:32 AM

## 2012-12-27 NOTE — Progress Notes (Signed)
Patient MW:[id number] KIN GALBRAITH      DOB: [birth year]      NUU:[id number]   Spoke with Dr. Christella Hartigan today regarding options for enteral nutrition.  Dr. Arlyce Dice will return to service on Monday and was the proceduralist involved in placing his stent .  Will enlist his opinion asap on any other options for trying to allow patient to have nutrition enterally and a way to vent his gastric content.  I updated the patient by phone and he told me that they did trial him with a clamped tube and lasted 5.5 hours before vomiting.  NGT back to suction.  Patient understands the limitations of his situation but needs to check off his options one at a time.  Will continue to work with him on his goals of care.  He is being really brave.  This is so hard for him as he works through his options.   Janet Decesare L. [name], MD MBA The Palliative Medicine Team at [facility] Phone: [phone] Pager: [phone]

## 2012-12-27 NOTE — Progress Notes (Addendum)
Patient ID: [name]  male  GNF:[id number]    DOB: 20-Nov-1956    DOA: 12/19/2012  PCP: [name], MD  Assessment/Plan: Principal Problem:  *Peritoneal carcinomatosis Active Problems:  ANEMIA  SBO (small bowel obstruction)  Hypokalemia  Nausea and vomiting in adult  Anemia of chronic disease  Colo-enteric fistula  Acute cholecystitis s/p perc draianage [id number]  Fistula of small intestine  Shock  Sepsis  Septic shock due to Staphylococcus aureus  Pancytopenia  Hypomagnesemia  1. Septic Shock secondary to MRSA bacteremia-? Line sepsis (has PICC line and Port-A-Cath): Less likely from Cholangitis.  - currently on vancomycin Patient was transferred to ICU a couple of days ago for hypotension and tachycardia. - ID following, recommended removal of PICC line, Port-A-Cath, prolonged IV antibiotics cultures. Blood cultures were 2 x 2 positive for MRSA. Repeat blood cultures drawn this morning.  - general surgery following, placed on NG tube clamping trial today. Per rec's If this fails, will need to consult interventional radiology for percutaneous gastrostomy  2. Pancytopenia: Leukopenia and thrombocytopenia have resolved. H/h stable. Follow CBC closely. 3. Hypokalemia and hypomagnesemia: Pharmacy replacing potassium and magnesium. Follow BMP. 4. Peritoneal carcinomatosis diagnosed in December 2012 s/p extensive debulking surgery at [facility] twice (1st time January 2013 second time 08/2012) . Patient has had multiple surgical procedures (refer to surgeon's notes). Palliative care team met with patient and extended family on 1/8 and patient wishes to pursue further cancer treatment possibly at [facility]. [name] assisting with coordination-much appreciated.  5. Small bowel obstruction/duodenal obstruction despite stent: Currently conservative management-n.p.o., and TNA. NG clamped today. 6. Cholecystitis status post cholecystostomy drain placed at [place] - IR performed  cholecystostomy tube change on 1/6.  DVT Prophylaxis:  Code Status: Full code  Disposition: Not medically ready    Subjective: So far doing well with clamping of NG, no nausea, vomiting or worsening abdominal pain  Objective: Weight change:   Intake/Output Summary (Last 24 hours) at 12/27/12 1343 Last data filed at 12/27/12 1217  Gross per 24 hour  Intake 6690.16 ml  Output   3725 ml  Net 2965.16 ml   Blood pressure 121/80, pulse 82, temperature 97.5 F (36.4 C), temperature source Axillary, resp. rate 20, height 6\' 4"  (1.93 m), weight 92.7 kg (204 lb 5.9 oz), SpO2 100.00%.  Physical Exam: General: Alert and awake, oriented x3, not in any acute distress. NG tube clamped HEENT: anicteric sclera, pupils reactive to light and accommodation, EOMI CVS: S1-S2 clear, no murmur rubs or gallops Chest: clear to auscultation bilaterally, no wheezing, rales or rhonchi Abdomen: soft nontender, nondistended,RLQ ostomy. RUQ cholecystostomy drain. Upper midline Eakin's pouch. LLQ drain.  Neuro: Cranial nerves II-XII intact, no focal neurological deficits  Lab Results: Basic Metabolic Panel:  Lab 12/27/12 5284 12/26/12 0530  NA 133* 133*  K 3.7 3.7  CL 101 102  CO2 23 23  GLUCOSE 129* 122*  BUN 20 17  CREATININE 0.64 0.62  CALCIUM 9.7 9.5  MG 1.6 --  PHOS 5.0* --   Liver Function Tests:  Lab 12/27/12 0430 12/25/12 0500  AST 24 34  ALT 52 63*  ALKPHOS 519* 449*  BILITOT 1.0 1.2  PROT 6.2 5.2*  ALBUMIN 2.4* 1.9*  CBC:  Lab 12/27/12 0430 12/26/12 0530 12/22/12 0920  WBC 6.5 4.1 --  NEUTROABS -- -- 4.3  HGB 8.6* 7.8* --  HCT 26.3* 24.3* --  MCV 84.6 84.4 --  PLT 205 155 --   CBG:  Lab 12/27/12 1238 12/27/12  1610 12/27/12 0630 12/27/12 0028 12/26/12 1813  GLUCAP 134* 124* 125* 135* 144*     Micro Results: Recent Results (from the past 240 hour(s))  CULTURE, BLOOD (ROUTINE X 2)     Status: Normal   Collection Time   12/22/12 12:55 PM      Component Value Range  Status Comment   Specimen Description BLOOD LEFT HAND   Final    Special Requests BOTTLES DRAWN AEROBIC AND ANAEROBIC [facility]   Final    Culture  Setup Time 12/22/2012 22:48   Final    Culture     Final    Value: METHICILLIN RESISTANT STAPHYLOCOCCUS AUREUS     Note: RIFAMPIN AND GENTAMICIN SHOULD NOT BE USED AS SINGLE DRUGS FOR TREATMENT OF STAPH INFECTIONS. CRITICAL RESULT CALLED TO, READ BACK BY AND VERIFIED WITH: JENNIFER FUQUAY @ 1258 12/24/12 BY KRAWS     Note: Gram Stain Report Called to,Read Back By and Verified With: Burnell Blanks @ 1254 12/23/12 BY KRAWS   Report Status 12/25/2012 FINAL   Final    Organism ID, Bacteria METHICILLIN RESISTANT STAPHYLOCOCCUS AUREUS   Final   CULTURE, BLOOD (ROUTINE X 2)     Status: Normal   Collection Time   12/22/12  1:05 PM      Component Value Range Status Comment   Specimen Description BLOOD LEFT HAND   Final    Special Requests BOTTLES DRAWN AEROBIC AND ANAEROBIC [facility]   Final    Culture  Setup Time 12/22/2012 22:48   Final    Culture     Final    Value: STAPHYLOCOCCUS AUREUS     Note: SUSCEPTIBILITIES PERFORMED ON PREVIOUS CULTURE WITHIN THE LAST 5 DAYS.     Note: Gram Stain Report Called to,Read Back By and Verified With: KIM Richardson Dopp @ 1254 12/23/12 BY KRAWS   Report Status 12/25/2012 FINAL   Final   MRSA PCR SCREENING     Status: Abnormal   Collection Time   12/22/12  5:42 PM      Component Value Range Status Comment   MRSA by PCR POSITIVE (*) NEGATIVE Final     Studies/Results: Dg Abd 1 View - Kub  12/11/2012  *RADIOLOGY REPORT*  Clinical Data: Duodenal stricture.  Attempted endoscopy.  DG C-ARM GT 120 MIN,ABDOMEN - 1 VIEW  Technique: 20 minutes and 33 seconds of fluoroscopy was utilized.  Comparison:  Upper GI 12/09/2012.  Findings: Three images are submitted.  The biliary drain and abdominal drain are in place.  The endoscope remains in the stomach, all three images.  IMPRESSION:  1.  Intraoperative fluoroscopy for guidance of endoscopy.   Original Report  Authenticated By: Marin Roberts, M.D.    Ct Abdomen Pelvis W Contrast  12/16/2012  *RADIOLOGY REPORT*  Clinical Data: Small bowel obstruction  CT ABDOMEN AND PELVIS WITH CONTRAST  Technique:  Multidetector CT imaging of the abdomen and pelvis was performed following the standard protocol during bolus administration of intravenous contrast.  Contrast: 80mL OMNIPAQUE IOHEXOL 300 MG/ML  SOLN  Comparison: Abdominal x-ray is of multiple recent base.  Findings: Images which include the lower chest shows small bilateral pleural effusions with bibasilar collapse / consolidation.  NG tube tip is noted in the mid stomach.  There appears to be some wall thickening in the distal stomach, but this may be related to the underdistended state.  Duodenum is opacified.  There is a stent in the proximal jejunum.  Contrast material enters the proximal end of the stent and  a very thin string of contrast can be seen to track through the stent lumen in the small bowel beyond the stent.  There is soft tissue or debris filling the stent lumen creating only is a very thin channel for flow of contrast through the stent.  Small bowel loops distal to the stent are decompressed.  There is a small amount of free fluid in the abdomen. Bowel anatomy is difficult to discern given the fairly substantial amount of adhesions and scarring in this patient with peritoneal carcinomatosis.  There is a right lower quadrant stoma, presumably ileostomy.  The patient appears to be status post omentectomy and probably right hemicolectomy.  A small bowel anastomoses is seen in the right abdomen.  No focal abnormalities seen in the liver or spleen.  Pancreas is unremarkable.  The adrenal glands are normal.  No evidence for hydronephrosis.  Probable cyst noted in the right kidney.  No abdominal aortic aneurysm.  Pigtail catheter is seen coiled in the anterior midline in or just deep to the rectus sheath.  A second to pigtail catheter in the right upper  quadrant adjacent to the gallbladder fundus is consistent with a reported history of cholecystostomy.  The gallbladder is decompressed.  Surgical drain is positioned in the left paracolic gutter.  Imaging through the pelvis shows enlarged prostate gland.  The bladder is decompressed.  Left colon is decompressed.  Bone windows reveal no worrisome lytic or sclerotic osseous lesions.  IMPRESSION: The proximal jejunal stent device is patent although only of very thin string of contrast material passes through the stent into the decompressed small bowel distal to the stent. Even with the stent in place, this segment of bowel appears to represent a region of luminal stenosis.  The duodenum proximal to the stent is distended.  Distal stomach appears to have abnormal circumferential wall thickening with some luminal compromise, but no outlet obstruction at this time.   Original Report Authenticated By: Kennith Center, M.D.    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  The patient is also status post prior percutaneous cholecystostomy tube placement for acalculous cholecystitis.  Assessment of the cholecystostomy tube is performed.  1.  CHOLANGIOGRAM THROUGH PREEXISTING CATHETER 2.  PERCUTANEOUS CHOLECYSTOSTOMY TUBE EXCHANGE  Contrast:  50 ml Omnipaque-300  Fluoroscopy Time: 7.5 minutes.  Procedure:  The procedure, risks, benefits, and alternatives were explained to the patient.  Questions regarding the procedure were encouraged and answered.  The patient understands and consents to the procedure.  The preexisting percutaneous cholecystostomy tube was prepped with Betadine in a sterile fashion, and a sterile drape was applied covering the operative field.  A sterile gown and sterile gloves were used for the procedure.  The preexisting catheter was injected with contrast material under fluoroscopy.   It was then removed over a guidewire.  A 5-French catheter was then introduced over a guidewire and advanced into the lumen of the gallbladder.  Additional contrast injection was performed.  The catheter was further advanced through the cystic duct and into the common bile duct and additional cholangiogram performed through the catheter.  The catheter was then retracted into the lumen of the gallbladder.  A new 10-French multipurpose drainage catheter was advanced over a wire and formed in the gallbladder lumen.  Final catheter position was confirmed with a fluoroscopic spot image obtained after injection of contrast.  The catheter was secured  at the skin with a Prolene retention suture and Stat-Lock device.  The catheter was connected to a gravity drainage bag.  Complications:  None  Findings:  Injection of a preexisting drain shows the catheter to lie just outside the lumen of the gallbladder.  Contrast injection does partially fill the gallbladder lumen and demonstrates a significant amount of internal debris and sludge within the gallbladder.  After removal of the preexisting catheter, a percutaneous tract to the gallbladder lumen was able to be catheterized.  Cholangiogram shows very slow and sluggish flow of contrast via the cystic duct into the common bile duct.  A significant amount of debris is present extending into the cystic duct and also within the distal aspect of the common bile duct.  With a catheter further advanced into the CBD, contrast injection does show a patent common bile duct with flow of contrast noted via the ampulla into the duodenum.  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  There is a fistula demonstrated to adjacent small bowel with eventual opacification of several right sided small bowel loops demonstrating poor motility.  An additional fistula extends medially into a space adjacent to the descending duodenum.   Given demonstration of fistulas as well as poor flow of contrast from the gallbladder into the common bile duct, a new cholecystostomy tube was placed and formed at the level of the gallbladder lumen.  This will be left to gravity drainage.  IMPRESSION: The preexisting catheter lies just outside the lumen of the gallbladder.  After recanalizing a tract to the gallbladder lumen, contrast injection demonstrates significant debris in the lumen of the gallbladder and poor flow of contrast into the common bile duct.  Debris was also present in the distal aspect of the CBD. The study also demonstrated complex fistulas communicating with the gallbladder lumen and opacifying both small bowel as well as a tract to a cavity lying adjacent to the descending duodenum.  A new 10-French catheter was formed in the gallbladder and will be left to gravity drainage.   Original Report Authenticated By: Irish Lack, M.D.    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  The patient is also status post prior percutaneous cholecystostomy tube placement for acalculous cholecystitis.  Assessment of the cholecystostomy tube is performed.  1.  CHOLANGIOGRAM THROUGH PREEXISTING CATHETER 2.  PERCUTANEOUS CHOLECYSTOSTOMY TUBE EXCHANGE  Contrast:  50 ml Omnipaque-300  Fluoroscopy Time: 7.5 minutes.  Procedure:  The procedure, risks, benefits, and alternatives were explained to the patient.  Questions regarding the procedure were encouraged and answered.  The patient understands and consents to the procedure.  The preexisting percutaneous cholecystostomy tube was prepped with Betadine in a sterile fashion, and a sterile drape was applied covering the operative field.  A sterile gown and sterile gloves were used for the procedure.  The preexisting catheter was injected with contrast material under fluoroscopy.  It was  then removed over a guidewire.  A 5-French catheter was then introduced over a guidewire and advanced into the lumen of the gallbladder.  Additional contrast injection was performed.  The catheter was further advanced through the cystic duct and into the common bile duct and additional cholangiogram performed through the catheter.  The catheter was then retracted into the lumen of the gallbladder.  A new 10-French multipurpose drainage catheter was advanced  over a wire and formed in the gallbladder lumen.  Final catheter position was confirmed with a fluoroscopic spot image obtained after injection of contrast.  The catheter was secured at the skin with a Prolene retention suture and Stat-Lock device.  The catheter was connected to a gravity drainage bag.  Complications:  None  Findings:  Injection of a preexisting drain shows the catheter to lie just outside the lumen of the gallbladder.  Contrast injection does partially fill the gallbladder lumen and demonstrates a significant amount of internal debris and sludge within the gallbladder.  After removal of the preexisting catheter, a percutaneous tract to the gallbladder lumen was able to be catheterized.  Cholangiogram shows very slow and sluggish flow of contrast via the cystic duct into the common bile duct.  A significant amount of debris is present extending into the cystic duct and also within the distal aspect of the common bile duct.  With a catheter further advanced into the CBD, contrast injection does show a patent common bile duct with flow of contrast noted via the ampulla into the duodenum.  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  There is a fistula demonstrated to adjacent small bowel with eventual opacification of several right sided small bowel loops demonstrating poor motility.  An additional fistula extends medially into a space adjacent to the descending duodenum.  Given  demonstration of fistulas as well as poor flow of contrast from the gallbladder into the common bile duct, a new cholecystostomy tube was placed and formed at the level of the gallbladder lumen.  This will be left to gravity drainage.  IMPRESSION: The preexisting catheter lies just outside the lumen of the gallbladder.  After recanalizing a tract to the gallbladder lumen, contrast injection demonstrates significant debris in the lumen of the gallbladder and poor flow of contrast into the common bile duct.  Debris was also present in the distal aspect of the CBD. The study also demonstrated complex fistulas communicating with the gallbladder lumen and opacifying both small bowel as well as a tract to a cavity lying adjacent to the descending duodenum.  A new 10-French catheter was formed in the gallbladder and will be left to gravity drainage.   Original Report Authenticated By: Irish Lack, M.D.    Dg Sinus/fist Tube Chk-non Gi  12/22/2012  *RADIOLOGY REPORT*  Clinical Data:Evaluate for fistula  ABSCESS INJECTION  Fluoroscopy Time: 1.09 minutes  Comparison: CT of the abdomen and pelvis dated 12/16/2012  Findings: Scout radiograph was obtained.  This shows a nasogastric tube within the stomach.  There is a metallic stent identified within the proximal jejunum.  JP drain overlies the left lower quadrant of the abdomen there is a percutaneous cholecystostomy tube within the right abdomen.  Left lower quadrant the JP drain was accessed and 40 ml of omni 300 was injected into the drain.  No significant extravasation of contrast material from the draining identified to suggest residual abscess or fistula formation.  Mild intra vascular extravasation with opacification of the left lower quadrant venous structures noted.  IMPRESSION: 1.  No evidence for residual abscess or fistula formation surrounding the left lower quadrant JP drain.  Findings were discussed with Dr. Michaell Cowing at the time the procedure was performed on  12/22/2012.   Original Report Authenticated By: Signa Kell, M.D.    Dg Chest Port 1 View  12/22/2012  *RADIOLOGY REPORT*  Clinical Data: Chills.  Shortness of breath.  PORTABLE CHEST - 1 VIEW  Comparison: 11/27/2012  Findings: Left Port-A-Cath and right PICC line remain in place, unchanged.  NG tube has been placed into the stomach.  Lungs are clear.  No effusions.  Heart is normal size.  IMPRESSION: No acute cardiopulmonary disease.   Original Report Authenticated By: Charlett Nose, M.D.    Dg Chest Portable 1 View  11/27/2012  *RADIOLOGY REPORT*  Clinical Data: Port-A-Cath and PICC line.  PORTABLE CHEST - 1 VIEW  Comparison: 10/26/2012  Findings: Shallow inspiration.  Mild cardiac enlargement with normal pulmonary vascularity.  There is increased opacity in the left base suggesting left pleural effusion with basilar atelectasis or infiltration.  Tortuous aorta.  Left subclavian Infuse-A-Port remains in place with tip over the mid SVC region.  A right PICC line remains in place with tip in the SVC / RA junction. No pneumothorax.  No significant change since previous study.  IMPRESSION: Probable left pleural effusion and basilar atelectasis or infiltration.  PICC line and central venous catheter remain unchanged in position.   Original Report Authenticated By: Burman Nieves, M.D.    Dg Ercp  12/13/2012  *RADIOLOGY REPORT*  Clinical Data: Stenotic small bowel segment just distal to the ligament of Treitz.  ERCP  Comparison:  Small bowel series from 12/09/2012  Technique:  Multiple spot images obtained with the fluoroscopic device and submitted for interpretation post-procedure.  ERCP was performed by Dr.  Arlyce Dice.  Findings: Four spot fluoro images are submitted.  I discussed this study by telephone with Dr. Arlyce Dice immediately after the procedure was completed.  The first image demonstrates a small bowel stent over the left abdomen.  The second image shows injection of contrast material which opacifies  the dilated loop just proximal to the intraluminal stent. The stent is opacified throughout its entire length and there does appear to be contrast draining from the distal end of the stent into nondilated small bowel.  The third image again shows contrast in the dilated loop proximal to the stent with opacification of the stent lumen down to the distal end of the stent.  The fourth image shows contrast material in the dilated small bowel loop just proximal to the stent.  There is contrast within the stent device with a small amount of contrast visible in the small bowel just distal to the stent.  Comparing this see image to image 16 of the previous small bowel series, the orientation of the bowel is very similar and confirms that the stent device is in the stenotic segment.  There is also some intraluminal contrast visible within small bowel superimposed on the distal stent which I think is probably some retrograde reflux of contrast back up along the stent, between the stent device and the bowel mucosa.  IMPRESSION: Endoscopic small bowel stent placement just distal to the ligament of Treitz.  These images are consistent with flow of contrast material from the dilated proximal small bowel through the stent into the decompressed small bowel distal to the stent.  These images were submitted for radiologic interpretation only. Please see the procedural report for the amount of contrast and the fluoroscopy time utilized.   Original Report Authenticated By: Kennith Center, M.D.    Dg Abd Portable 1v  12/25/2012  *RADIOLOGY REPORT*  Clinical Data: NG tube placement  PORTABLE ABDOMEN - 1 VIEW  Comparison: 12/15/2012  Findings: Enteric stent remains in place.  Surgical drain along the left side of abdomen.  Cholecystostomy tube projects over the gallbladder lumen.  NG tube placed with its tip in the fundus of the stomach.  No obvious free intraperitoneal gas.  No disproportionate dilatation of bowel.  IMPRESSION: NG tube  tip is in the fundus of the stomach.   Original Report Authenticated By: Jolaine Click, M.D.    Dg Abd Portable 1v  12/15/2012  *RADIOLOGY REPORT*  Clinical Data: NG tube placed  PORTABLE ABDOMEN - 1 VIEW  Comparison: 2:40 hours  Findings: NG tube has been placed with its tip in the fundus of the stomach.  The amount of contrast in the stomach has diminished.  No disproportionate dilatation of bowel.  Bowel stent is stable in the left side of the abdomen.  IMPRESSION: NG tube placed into the stomach.  Diminished contrast in the stomach.   Original Report Authenticated By: Jolaine Click, M.D.    Dg Abd Portable 1v  12/15/2012  *RADIOLOGY REPORT*  Clinical Data: Check contrast progression through the small bowel.  PORTABLE ABDOMEN - 1 VIEW  Comparison: Abdominal radiograph performed 12/14/2012  Findings: Contrast is again seen filling the stomach, and is now better seen filling the duodenum.  There is distension of the fourth segment of the duodenum to 7.2 cm.  This is highly suspicious for occlusion at the level of the jejunal stent.  No contrast is seen progressing through the jejunal stent.  The visualized bowel gas pattern is grossly unremarkable, though there is a relative paucity of bowel gas within the abdomen.  No definite free abdominal air is identified, though evaluation for free air is suboptimal on a single supine view.  The right-sided cholecystostomy tube is grossly unremarkable in appearance.  Left-sided drains are grossly stable, though the position of the more superior drain has shifted mildly.  No acute osseous abnormalities are identified.  IMPRESSION: Contrast again seen filling the stomach, and better seen filling the duodenum.  Distension of the fourth segment of the duodenum to 7.2 cm.  This is highly suspicious for occlusion at the level of the jejunal stent.  No evidence of contrast progression beyond the jejunal stent.   Original Report Authenticated By: Tonia Ghent, M.D.    Dg Abd  Portable 1v  12/14/2012  *RADIOLOGY REPORT*  Clinical Data: Small bowel obstruction; assess for patency of jejunal stent.  PORTABLE ABDOMEN - 1 VIEW  Comparison: Abdominal radiograph performed earlier today at 09:06 p.m.  Findings: Contrast is noted filling the stomach; previously noted contrast within the duodenum is no longer well seen and may have become more dilute, or may have refluxed back into the stomach.  No contrast is seen within the jejunal stent or more distally.  The visualized bowel gas pattern is grossly unremarkable. A right- sided cholecystostomy tube is again noted; left-sided drains are seen.  No acute osseous abnormalities are seen.  Retrocardiac airspace opacification is again noted.  IMPRESSION:  1.  Contrast noted filling the stomach; previously noted contrast within the duodenum is no longer well seen and may have become more dilute, or may have refluxed back into the stomach.  No contrast seen within the jejunal stent or more distally.  To assess for obstruction at the jejunal stent, a formal upper GI series and small bowel follow-through should be performed, when clinically appropriate.  In the meantime, a follow-up film may be considered in 3-4 hours.  2.  Persistent retrocardiac airspace opacification.   Original Report Authenticated By: Tonia Ghent, M.D.    Dg Abd Portable 1v  12/14/2012  *RADIOLOGY REPORT*  Clinical Data: Small  bowel follow-through; follow-up image at 3 hours 10 minutes.  PORTABLE ABDOMEN - 1 VIEW  Comparison: Abdominal radiograph performed earlier today at 06:24 p.m.  Findings: Ingested contrast remains mostly within the stomach, with a small amount of contrast seen extending through the duodenum to the level of the jejunal stent.  No definite contrast is seen extending through the majority of the jejunal stent.  The visualized bowel gas pattern is grossly unremarkable, though there is a relative paucity of bowel gas within the abdomen.  A right-sided  cholecystostomy tube is grossly unchanged in appearance.  No definite free intra-abdominal air is identified, though evaluation for free air is suboptimal on supine views.  No acute osseous abnormalities are identified.  Persistent retrocardiac airspace opacification is noted.  IMPRESSION:  1.  Ingested contrast remains mostly within the stomach, with a small amount of contrast seen extending through the duodenum to the level of the jejunal stent.  No definite contrast seen extending through the majority of the jejunal stent.  A follow-up image is planned in 2 hours.  2.  Persistent retrocardiac airspace opacification seen.   Original Report Authenticated By: Tonia Ghent, M.D.    Dg Abd Portable 1v  12/13/2012  *RADIOLOGY REPORT*  Clinical Data: Status post proximal jejunal stent placement for stenotic segment.  PORTABLE ABDOMEN - 1 VIEW  Comparison: ERCP images from earlier the same day  Findings: The contrast material which was present in the dilated small bowel just proximal to the stent is not readily evident on this film.  There is some contrast posterior wall in the fundus of the stomach. Unfortunately, it is not possible to know whether the contrast seen in the dilated small bowel proximal to the stent on the ERCP has passed through the stent device or refluxed back up into the stomach.  IMPRESSION: No contrast material visible within dilated small bowel proximal to the stent, but since no contrast can be definitely visualized distal to the stent it is unclear whether the contrast refluxed back into the stomach or has migrated distally through the stent and become diluted out in the more distal small bowel loops.   Original Report Authenticated By: Kennith Center, M.D.    Dg Abd Portable 1v  12/09/2012  *RADIOLOGY REPORT*  Clinical Data: Nasogastric tube placement.  PORTABLE ABDOMEN - 1 VIEW  Comparison: Abdominal radiograph performed 12/07/2012  Findings: The patient's enteric tube is seen ending  overlying the body of the stomach.  The visualized bowel gas pattern is grossly unremarkable.  An apparent ostomy site is noted at the right lower quadrant.  A small right-sided IJ catheter is unchanged in position.  Left-sided drains are also grossly stable.  No acute osseous abnormalities are identified.  Mild sclerotic change is seen at the sacroiliac joints.  IMPRESSION: Enteric tube noted ending overlying the body of the stomach.   Original Report Authenticated By: Tonia Ghent, M.D.    Dg Abd Portable 1v  12/07/2012  *RADIOLOGY REPORT*  Clinical Data: For NG tube placement  PORTABLE ABDOMEN - 1 VIEW  Comparison: Abdomen film of 03/26/2012  Findings: The feeding tube enters the stomach and coils with the tip in the fundus of the stomach.  There is abnormal opacity at the left lung base suspicious for atelectasis and effusion.  Pneumonia cannot be excluded.  IMPRESSION:  1.  Feeding tube enters the stomach with the tip coiled within the fundus. 2.  Opacity at the left lung base.  Consider left basilar atelectasis, effusion, and  possibly pneumonia.   Original Report Authenticated By: Dwyane Dee, M.D.    Varney Biles Kayleen Memos Hans Eden  12/14/2012  *RADIOLOGY REPORT*  Clinical Data:  Nausea and vomiting after small bowel wall stent placement.  UPPER GI SERIES WITH KUB  Technique:  Routine upper GI series was performed with thin barium.  Fluoroscopy Time: Zero minutes  Comparison:  None.  Findings: Scout view of the abdomen shows and metallic wall stent in the left abdomen.  Surgical drain is also seen in the left abdomen. Postoperative changes and a pigtail drain are seen in the right abdomen.  Relative paucity of gas overall.  An extremely limited examination was performed at the request of the referring physician.  The patient drank one couple of barium. A single AP supine image shows contrast in the stomach.  IMPRESSION: Contrast remains in the stomach after ingestion of one cup of barium.  The patient will drink another  half cup of barium and will be reimaged in 2 hours to assess patency of the small bowel stent.   Original Report Authenticated By: Leanna Battles, M.D.    Dg Kayleen Memos W/small Bowel  12/09/2012  *RADIOLOGY REPORT*  Clinical Data:Persistent vomiting.  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 Thin barium also used.  Subsequently, serial images of the small bowel were obtained including spot views of the terminal ileum.  Fluoroscopy Time: 4.2-minute  Comparison: CT abdomen 11/27/2012  Findings: Thin barium was injected through the indwelling NG tube into  the stomach.  The stomach is mildly dilated.  The duodenum is markedly dilated.  There is a transition point at the ligament of Treitz with a stricture present at the ligament of Treitz.  There is very little contrast in the proximal jejunum.  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  Delayed images were obtained for 1 hour 15 minutes showing only a small amount of barium in the proximal jejunum.  IMPRESSION: High-grade obstruction at the ligament of Treitz with dilatation of the duodenum.  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  I discussed the findings with Willette Cluster, PA [facility] [place] [facility]   Original Report Authenticated By: Janeece Riggers, M.D.    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  UPPER GI SERIES WITH KUB  Technique:  Routine upper GI series was performed with Omnipaque- 300  Fluoroscopy Time: 1.47 minutes  Comparison:  CT scan 12/16/2012  Findings: Contrast was infused via the NG tube in the stomach. Stomach is unremarkable.  Very little peristaltic action was demonstrated but contrast did enter into the  duodenum with the patient on the right side and elevated.  Occasional GE reflux was noted.  There was to and fro movement of contrast in the second portion of the duodenum which was mildly dilated.  Very little contrast entered the duodenal stent but a small amount did pass through it.  Very little contrast was seen beyond the stent.  IMPRESSION:  1.  Mild dilatation of the duodenum with very little contrast seen entering the duodenal stent. 2.  No dilated distal small bowel to suggest obstruction.   Original Report Authenticated By: Rudie Meyer, M.D.    Dg C-arm Gt 120 Min  12/11/2012  *  RADIOLOGY REPORT*  Clinical Data: Duodenal stricture.  Attempted endoscopy.  DG C-ARM GT 120 MIN,ABDOMEN - 1 VIEW  Technique: 20 minutes and 33 seconds of fluoroscopy was utilized.  Comparison:  Upper GI 12/09/2012.  Findings: Three images are submitted.  The biliary drain and abdominal drain are in place.  The endoscope remains in the stomach, all three images.  IMPRESSION:  1.  Intraoperative fluoroscopy for guidance of endoscopy.   Original Report Authenticated By: Marin Roberts, M.D.     Medications: Scheduled Meds:   . antiseptic oral rinse  15 mL Mouth Rinse BID  . Chlorhexidine Gluconate Cloth  6 each Topical Q0600  . enoxaparin (LOVENOX) injection  40 mg Subcutaneous Q24H  . insulin aspart  0-9 Units Subcutaneous Q6H  . lip balm  1 application Topical BID  . metoCLOPramide  10 mg Intravenous QID  . pantoprazole  40 mg Intravenous Q12H  . sodium chloride  10-40 mL Intracatheter Q12H  . vancomycin  1,000 mg Intravenous Q8H      LOS: 8 days   RAI,RIPUDEEP M.D. [facility] 12/27/2012, 1:43 PM Pager: [phone]  If 7PM-7AM, please contact night-coverage [email or web address] Password [email or web address]

## 2012-12-27 NOTE — Progress Notes (Signed)
Subjective: Comfortable.  Objective: Vital signs in last 24 hours: Temp:  [97.5 F (36.4 C)-98.3 F (36.8 C)] 97.5 F (36.4 C) (01/11 0600) Pulse Rate:  [70-92] 82  (01/11 0600) Resp:  [18-20] 20  (01/11 0600) BP: (118-121)/(79-83) 121/80 mmHg (01/11 0600) SpO2:  [99 %-100 %] 100 % (01/11 0600) Last BM Date: 12/25/12  Intake/Output from previous day: 01/10 0701 - 01/11 0700 In: 6690.2 [I.V.:1214.3; IV Piggyback:800; ZOX:0960.4] Out: 3775 [Urine:1950; Emesis/NG output:1750; Drains:75] Intake/Output this shift: Total I/O In: -  Out: 700 [Urine:450; Emesis/NG output:250]  PE: General- In NAD Abdomen-soft, fistula well drained with Eakin's pouch. Other drains remain.  Lab Results:   Basename 12/27/12 0430 12/26/12 0530  WBC 6.5 4.1  HGB 8.6* 7.8*  HCT 26.3* 24.3*  PLT 205 155   BMET  Basename 12/27/12 0430 12/26/12 0530  NA 133* 133*  K 3.7 3.7  CL 101 102  CO2 23 23  GLUCOSE 129* 122*  BUN 20 17  CREATININE 0.64 0.62  CALCIUM 9.7 9.5   PT/INR No results found for this basename: LABPROT:2,INR:2 in the last 72 hours Comprehensive Metabolic Panel:    Component Value Date/Time   NA 133* 12/27/2012 0430   K 3.7 12/27/2012 0430   CL 101 12/27/2012 0430   CO2 23 12/27/2012 0430   BUN 20 12/27/2012 0430   CREATININE 0.64 12/27/2012 0430   GLUCOSE 129* 12/27/2012 0430   CALCIUM 9.7 12/27/2012 0430   AST 24 12/27/2012 0430   ALT 52 12/27/2012 0430   ALKPHOS 519* 12/27/2012 0430   BILITOT 1.0 12/27/2012 0430   PROT 6.2 12/27/2012 0430   ALBUMIN 2.4* 12/27/2012 0430     Studies/Results: Dg Abd Portable 1v  12/25/2012  *RADIOLOGY REPORT*  Clinical Data: NG tube placement  PORTABLE ABDOMEN - 1 VIEW  Comparison: 12/15/2012  Findings: Enteric stent remains in place.  Surgical drain along the left side of abdomen.  Cholecystostomy tube projects over the gallbladder lumen.  NG tube placed with its tip in the fundus of the stomach.  No obvious free intraperitoneal gas.  No  disproportionate dilatation of bowel.  IMPRESSION: NG tube tip is in the fundus of the stomach.   Original Report Authenticated By: Jolaine Click, M.D.    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  UPPER GI SERIES WITH KUB  Technique:  Routine upper GI series was performed with Omnipaque- 300  Fluoroscopy Time: 1.47 minutes  Comparison:  CT scan 12/16/2012  Findings: Contrast was infused via the NG tube in the stomach. Stomach is unremarkable.  Very little peristaltic action was demonstrated but contrast did enter into the duodenum with the patient on the right side and elevated.  Occasional GE reflux was noted.  There was to and fro movement of contrast in the second portion of the duodenum which was mildly dilated.  Very little contrast entered the duodenal stent but a small amount did pass through it.  Very little contrast was seen beyond the stent.  IMPRESSION:  1.  Mild dilatation of the duodenum with very little contrast seen entering the duodenal stent. 2.  No dilated distal small bowel to suggest obstruction.   Original Report Authenticated By: Rudie Meyer, M.D.     Anti-infectives: Anti-infectives     Start     Dose/Rate Route Frequency Ordered Stop   12/24/12 1000   vancomycin (VANCOCIN) IVPB 1000 mg/200 mL premix  1,000 mg 200 mL/hr over 60 Minutes Intravenous Every 8 hours 12/24/12 0836     12/23/12 2200   vancomycin (VANCOCIN) IVPB 1000 mg/200 mL premix  Status:  Discontinued        1,000 mg 200 mL/hr over 60 Minutes Intravenous Every 12 hours 12/23/12 1040 12/24/12 0836   12/22/12 1600   vancomycin (VANCOCIN) IVPB 1000 mg/200 mL premix  Status:  Discontinued        1,000 mg 200 mL/hr over 60 Minutes Intravenous Every 8 hours 12/22/12 1539 12/23/12 1040   12/22/12 1600   piperacillin-tazobactam (ZOSYN) IVPB 3.375 g  Status:  Discontinued        3.375 g 12.5 mL/hr over 240  Minutes Intravenous Every 8 hours 12/22/12 1539 12/24/12 1441          Assessment Principal Problem:  *Peritoneal carcinomatosis Active Problems:  ANEMIA  Duodenal obstruction s/p stenting  Anemia of chronic disease  Colo-enteric fistula  Acute cholecystitis s/p perc draianage [id number]  Fistula of small intestine      LOS: 8 days   Plan: Ng clamping trial.  If this fails then will ask IR to evaluate for percutaneous gastrostomy.   Dorwin Fitzhenry J 12/27/2012

## 2012-12-27 NOTE — Progress Notes (Signed)
Clamped NG tube at 12:00 per MD order.  Patient continually denied nausea however did vomit at 5:30 p.m. Nurse hooked NG back to suction and immediately removed 250 ccs of green fluid.  Patient stated he felt no nausea but that the vomiting just hit him suddenly.  NG currently running at [facility].  Patient states he feels relieved.

## 2012-12-28 LAB — BASIC METABOLIC PANEL
Chloride: 99 mEq/L (ref 96–112)
Creatinine, Ser: 0.71 mg/dL (ref 0.50–1.35)
GFR calc Af Amer: >90 mL/min (ref >90)

## 2012-12-28 LAB — GLUCOSE, CAPILLARY: Glucose-Capillary: 122 mg/dL — ABNORMAL HIGH (ref 70–99)

## 2012-12-28 LAB — MAGNESIUM: Magnesium: 1.5 mg/dL (ref 1.5–2.5)

## 2012-12-28 MED ORDER — VANCOMYCIN HCL IN DEXTROSE 1-5 GM/200ML-% IV SOLN
1000.0000 mg | Freq: Two times a day (BID) | INTRAVENOUS | Status: DC
  Administered 2012-12-29 – 2012-12-31 (×5): 1000 mg via INTRAVENOUS
  Filled 2012-12-28 (×8): qty 200

## 2012-12-28 MED ORDER — INSULIN REGULAR HUMAN 100 UNIT/ML IJ SOLN
INTRAVENOUS | Status: AC
  Administered 2012-12-28: 17:00:00 via INTRAVENOUS
  Filled 2012-12-28: qty 2000

## 2012-12-28 NOTE — Progress Notes (Signed)
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 Percocet (Oxycodone-Acetaminophen)     hallucination   Patient Measurements: Height: 6\' 4"  (193 cm) Weight: 204 lb 5.9 oz (92.7 kg) IBW/kg (Calculated) : 86.8   Vital Signs: Temp: 98.3 F (36.8 C) (01/12 0525) Temp src: Oral (01/12 0525) BP: 110/70 mmHg (01/12 0525) Pulse Rate: 85  (01/12 0525) Intake/Output from previous day: 01/11 0701 - 01/12 0700 In: 2985.6 [IV Piggyback:600; TPN:2385.6] Out: 2850 [Urine:550; Emesis/NG output:1700; Drains:300; Stool:300]  Labs:  [facility] 12/27/12 0430 12/26/12 0530  WBC 6.5 4.1  HGB 8.6* 7.8*  HCT 26.3* 24.3*  PLT 205 155  APTT -- --  INR -- --     Basename 12/28/12 0320 12/27/12 0430 12/26/12 0530  NA 133* 133* 133*  K 3.8 3.7 3.7  CL 99 101 102  CO2 25 23 23   GLUCOSE 121* 129* 122*  BUN 23 20 17   CREATININE 0.71 0.64 0.62  LABCREA -- -- --  CREAT24HRUR -- -- --  CALCIUM 9.9 9.7 9.5  MG 1.5 1.6 1.4*  PHOS 4.8* 5.0* --  PROT -- 6.2 --  ALBUMIN -- 2.4* --  AST -- 24 --  ALT -- 52 --  ALKPHOS -- 519* --  BILITOT -- 1.0 --  BILIDIR -- -- --  IBILI -- -- --  PREALBUMIN -- -- --  TRIG -- -- --  CHOLHDL -- -- --  CHOL -- -- --   Estimated Creatinine Clearance: 126.6 ml/min (by C-G formula based on Cr of 0.71).   Medications:  Scheduled:     . antiseptic oral rinse  15 mL Mouth Rinse BID  . Chlorhexidine Gluconate Cloth  6 each Topical Q0600  . enoxaparin (LOVENOX) injection  40 mg Subcutaneous Q24H  . insulin aspart  0-9 Units Subcutaneous Q6H  . lip balm  1 application Topical BID  . metoCLOPramide  10 mg Intravenous QID  . pantoprazole  40 mg Intravenous Q12H  . sodium chloride  10-40 mL Intracatheter Q12H  . vancomycin  1,000 mg Intravenous Q8H   Infusions:     . sodium chloride 10 mL/hr at 12/24/12 1441  . [EXPIRED] fat emulsion 240 mL  (12/26/12 1703)  . [EXPIRED] TPN (CLINIMIX) +/- additives 115 mL/hr at 12/26/12 1703  . TPN (CLINIMIX) +/- additives 83 mL/hr at 12/27/12 1721  . [DISCONTINUED] TPN (CLINIMIX) +/- additives     Insulin Requirements in the past 24 hours:  CBG 122-136 4 units Novolog SSI 20 units regular insulin in TPN bag Per [facility] med list, patient on SSI and regular insulin in TPN (44.6 units)  Nutritional Goals:  RD recs (12/21/12):  Kcal: 2600-2800, Protein: 130-140 gm, Fluid: >2.6L/day Continuing approx same formula as LTACH  Current Nutrition:   Goal rate:  Clinimix E5/20 at 166ml/hr + Lipids MWF to provide Protein 138gm and 2909 kcal MWF, 2429 Kcal TTSS for avg of 2634 kcal  TPN PTA provide 2624 Kcals, 140g Protein, fluid (125 ml/hr over 24 hrs)  NPO (as of 12/20/12)  TPN rate reduced 12/27/12 d/t elevated Phos.  Clinimix E5/20 at 83 ml/hr to provide Protein 100 gm and 1753 kcal/day   Assessment:  56 YOM with complex history including terminal peritoneal carcinomatosis s/p chemo.  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  Pt with LLQ drain, LUQ drain, ileostomy, choly drain, NG tube.    Pt transferred  to [facility] on 12/20/11 from [facility] [place] for SBO management, N/V, high NG output.  No further plans for chemotherapy or surgery at this time.  Pt developed hypotension and tachycardia and transferred to ICU on 1/6 with MRSA bacteremia.  ID has recommended line removal, but at this time, the pt elects to continue central lines despite risks.  Failed NG clamping trial 12/27/12.  Follow up plans for PEG.  Labs:   Na remains low (133), cannot adjust with Clinimix TNA  Potassium and mag wnl.   Phos (4.8) remains elevated, but improved.  Will continue reduced TNA rate. Renal:  SCr improved, stable LFTs: ALT/ALT and Tbili wnl (1/11), Alk phos increased to 519 (1/11) CBGs:  CBGs at goal < 150 mg/dl IVF: NS at kvo GI PPx: Protonix  Chol/Trigs:  115/66 Prealbumin: 29.8 (1/4)  Plan:   Continue Clinimix E 5/20 with reduced rate of 83 ml/hr.   F/u plan for removal of central lines.  Will Continue TNA while long term plan of care is being determined    Continue insulin 20 units/bag and monitor CBGs  IV Lipids, Standard MVI and trace elements on MWF only due to ongoing shortage.  IVF per Md.  TNA lab panels on Mondays & Thursdays  [facility] PharmD, BCPS Pager [phone] 12/28/2012 10:15 AM

## 2012-12-28 NOTE — Progress Notes (Signed)
ANTIBIOTIC CONSULT NOTE - FOLLOW UP  Pharmacy Consult for Vancomycin Indication: MRSA Bacteremia   Allergies  Allergen Reactions  . Percocet (Oxycodone-Acetaminophen)     hallucination    Patient Measurements: Height: 6\' 4"  (193 cm) Weight: 204 lb 5.9 oz (92.7 kg) IBW/kg (Calculated) : 86.8  Adjusted Body Weight:   Vital Signs: Temp: 97.3 F (36.3 C) (01/12 1451) Temp src: Oral (01/12 1451) BP: 107/70 mmHg (01/12 1451) Pulse Rate: 99  (01/12 1451) Intake/Output from previous day: 01/11 0701 - 01/12 0700 In: 2985.6 [IV Piggyback:600; TPN:2385.6] Out: 2850 [Urine:550; Emesis/NG output:1700; Drains:300; Stool:300] Intake/Output from this shift: Total I/O In: 1212.6 [P.O.:200; TPN:1012.6] Out: 800 [Emesis/NG output:700; Stool:100]  Labs:  Basename 12/28/12 0320 12/27/12 0430 12/26/12 0530  WBC -- 6.5 4.1  HGB -- 8.6* 7.8*  PLT -- 205 155  LABCREA -- -- --  CREATININE 0.71 0.64 0.62   Estimated Creatinine Clearance: 126.6 ml/min (by C-G formula based on Cr of 0.71).  Basename 12/28/12 1744  VANCOTROUGH 29.9*  VANCOPEAK --  Drue Dun --  GENTTROUGH --  GENTPEAK --  GENTRANDOM --  TOBRATROUGH --  TOBRAPEAK --  TOBRARND --  AMIKACINPEAK --  AMIKACINTROU --  AMIKACIN --     Microbiology: Recent Results (from the past 720 hour(s))  CULTURE, BLOOD (ROUTINE X 2)     Status: Normal   Collection Time   12/22/12 12:55 PM      Component Value Range Status Comment   Specimen Description BLOOD LEFT HAND   Final    Special Requests BOTTLES DRAWN AEROBIC AND ANAEROBIC [facility]   Final    Culture  Setup Time 12/22/2012 22:48   Final    Culture     Final    Value: METHICILLIN RESISTANT STAPHYLOCOCCUS AUREUS     Note: RIFAMPIN AND GENTAMICIN SHOULD NOT BE USED AS SINGLE DRUGS FOR TREATMENT OF STAPH INFECTIONS. CRITICAL RESULT CALLED TO, READ BACK BY AND VERIFIED WITH: JENNIFER FUQUAY @ 1258 12/24/12 BY KRAWS     Note: Gram Stain Report Called to,Read Back By and Verified  With: Burnell Blanks @ 1254 12/23/12 BY KRAWS   Report Status 12/25/2012 FINAL   Final    Organism ID, Bacteria METHICILLIN RESISTANT STAPHYLOCOCCUS AUREUS   Final   CULTURE, BLOOD (ROUTINE X 2)     Status: Normal   Collection Time   12/22/12  1:05 PM      Component Value Range Status Comment   Specimen Description BLOOD LEFT HAND   Final    Special Requests BOTTLES DRAWN AEROBIC AND ANAEROBIC [facility]   Final    Culture  Setup Time 12/22/2012 22:48   Final    Culture     Final    Value: STAPHYLOCOCCUS AUREUS     Note: SUSCEPTIBILITIES PERFORMED ON PREVIOUS CULTURE WITHIN THE LAST 5 DAYS.     Note: Gram Stain Report Called to,Read Back By and Verified With: KIM Richardson Dopp @ 1254 12/23/12 BY KRAWS   Report Status 12/25/2012 FINAL   Final   MRSA PCR SCREENING     Status: Abnormal   Collection Time   12/22/12  5:42 PM      Component Value Range Status Comment   MRSA by PCR POSITIVE (*) NEGATIVE Final   CULTURE, BLOOD (ROUTINE X 2)     Status: Normal (Preliminary result)   Collection Time   12/27/12  4:30 AM      Component Value Range Status Comment   Specimen Description BLOOD RIGHT PICC   Final  Special Requests BOTTLES DRAWN AEROBIC AND ANAEROBIC 5CC   Final    Culture  Setup Time 12/27/2012 13:21   Final    Culture     Final    Value:        BLOOD CULTURE RECEIVED NO GROWTH TO DATE CULTURE WILL BE HELD FOR 5 DAYS BEFORE ISSUING A FINAL NEGATIVE REPORT   Report Status PENDING   Incomplete     Anti-infectives     Start     Dose/Rate Route Frequency Ordered Stop   12/24/12 1000   vancomycin (VANCOCIN) IVPB 1000 mg/200 mL premix  Status:  Discontinued        1,000 mg 200 mL/hr over 60 Minutes Intravenous Every 8 hours 12/24/12 0836 12/28/12 1840   12/23/12 2200   vancomycin (VANCOCIN) IVPB 1000 mg/200 mL premix  Status:  Discontinued        1,000 mg 200 mL/hr over 60 Minutes Intravenous Every 12 hours 12/23/12 1040 12/24/12 0836   12/22/12 1600   vancomycin (VANCOCIN) IVPB 1000 mg/200 mL premix   Status:  Discontinued        1,000 mg 200 mL/hr over 60 Minutes Intravenous Every 8 hours 12/22/12 1539 12/23/12 1040   12/22/12 1600   piperacillin-tazobactam (ZOSYN) IVPB 3.375 g  Status:  Discontinued        3.375 g 12.5 mL/hr over 240 Minutes Intravenous Every 8 hours 12/22/12 1539 12/24/12 1441          Assessment:  57 YO M with h/o terminal peritoneal carcinomatosis s/p chemo. Pt developed hypotension and tachycardia 1/6 with possible sepsis (cholangitis after biliary drain manipulation)   Day #7 vancomycin 1g q8 for MRSA bacteremia  Vancomycin trough= 29.9    Goal of Therapy:  Vancomycin trough level 15-20 mcg/ml Eradication of infection  Plan:  1) Due supratherapeutic trough, will change vancomycin from 1g q8 to 1g q12 to restart tomorrow AM 2) continue to follow renal function and recheck vanc trough prn   Hessie Knows, PharmD, BCPS Pager [phone] 12/28/2012 6:47 PM

## 2012-12-28 NOTE — Progress Notes (Signed)
Patient ID: [name]  male  WUJ:[id number]    DOB: 1956/08/31    DOA: 12/19/2012  PCP: [name], MD  Assessment/Plan: Principal Problem:  *Peritoneal carcinomatosis Active Problems:  ANEMIA  SBO (small bowel obstruction)  Hypokalemia  Nausea and vomiting in adult  Anemia of chronic disease  Colo-enteric fistula  Acute cholecystitis s/p perc draianage [id number]  Fistula of small intestine  Shock  Sepsis  Septic shock due to Staphylococcus aureus  Pancytopenia  Hypomagnesemia  1. Septic Shock secondary to MRSA bacteremia-? Line sepsis (has PICC line and Port-A-Cath): Less likely from Cholangitis.  - currently on vancomycin. Patient was transferred to ICU a couple of days ago for hypotension and tachycardia. - ID following, recommended removal of PICC line, Port-A-Cath, line holiday and prolonged IV antibiotics. Blood cultures were 2 x 2 positive for MRSA. Repeat blood cultures 1/11 negative to date.  - patient wishes to wait until 1/12 so that feeding options are sorted out before deciding to remove central lines. He is aware of risk of endocarditis. 2. Pancytopenia: Leukopenia and thrombocytopenia have resolved. H/h stable.  3. Hypokalemia and hypomagnesemia: Pharmacy replacing potassium and magnesium. Improved. 4. Peritoneal carcinomatosis diagnosed in December 2012 s/p extensive debulking surgery at [facility] twice (1st time January 2013 second time 08/2012) . Patient has had multiple surgical procedures (refer to surgeon's notes). Palliative care team met with patient and extended family on 1/8 and patient wishes to pursue further cancer treatment possibly at [facility]. [name] assisting with coordination-much appreciated.  5. Small bowel obstruction/duodenal obstruction despite stent: Currently conservative management-n.p.o., and TNA. Failed NGT clamping trial after 5-1/2 hours on 1/11. Surgery has consulted IR for PEG placement. Dr. Arlyce Dice, GI who had performed initial duodenal  stunt will be available 1/12. To discuss with Dr. Arlyce Dice on 1/12 regarding intervention by him Versus PEG placement by IR. 6. Cholecystitis status post cholecystostomy drain placed at [facility] - IR performed cholecystostomy tube change on 1/6.  Code Status: Full  Family Communication: Discussed with patient and daughter at bedside.  Disposition Plan: To be determined-not medically ready   Consultants:  Oncology  [facility]  IR  General surgery.  Critical care medicine.  Palliative care medicine  ID  Procedures:  NG tube  Change of cholecystostomy tube by IR on 1/6  Antibiotics:  IV vancomycin 1/6 >  IV Zosyn 1/6 > 1/8   Subjective: Denies complaints.  Objective: Weight change:   Intake/Output Summary (Last 24 hours) at 12/28/12 1508 Last data filed at 12/28/12 1100  Gross per 24 hour  Intake 2018.6 ml  Output   1300 ml  Net  718.6 ml   Blood pressure 107/70, pulse 99, temperature 97.3 F (36.3 C), temperature source Oral, resp. rate 20, height 6\' 4"  (1.93 m), weight 92.7 kg (204 lb 5.9 oz), SpO2 99.00%.  Physical Exam: General: Alert and awake, oriented x3, not in any acute distress.  CVS: S1-S2 clear, no murmur rubs or gallops Chest: clear to auscultation bilaterally, no wheezing, rales or rhonchi Abdomen: soft nontender, nondistended,RLQ ostomy. RUQ cholecystostomy drain. Upper midline Eakin's pouch. LLQ drain.  Neuro: Cranial nerves II-XII intact, no focal neurological deficits  Lab Results: Basic Metabolic Panel:  Lab 12/28/12 8657 12/27/12 0430  NA 133* 133*  K 3.8 3.7  CL 99 101  CO2 25 23  GLUCOSE 121* 129*  BUN 23 20  CREATININE 0.71 0.64  CALCIUM 9.9 9.7  MG 1.5 --  PHOS 4.8* --   Liver Function Tests:  Lab 12/27/12 0430 12/25/12 0500  AST 24 34  ALT 52 63*  ALKPHOS 519* 449*  BILITOT 1.0 1.2  PROT 6.2 5.2*  ALBUMIN 2.4* 1.9*  CBC:  Lab 12/27/12 0430 12/26/12 0530 12/22/12 0920  WBC 6.5 4.1 --  NEUTROABS -- -- 4.3  HGB 8.6*  7.8* --  HCT 26.3* 24.3* --  MCV 84.6 84.4 --  PLT 205 155 --   CBG:  Lab 12/28/12 1130 12/28/12 0542 12/28/12 0015 12/27/12 1736 12/27/12 1238  GLUCAP 162* 136* 122* 130* 134*     Micro Results: Recent Results (from the past 240 hour(s))  CULTURE, BLOOD (ROUTINE X 2)     Status: Normal   Collection Time   12/22/12 12:55 PM      Component Value Range Status Comment   Specimen Description BLOOD LEFT HAND   Final    Special Requests BOTTLES DRAWN AEROBIC AND ANAEROBIC [facility]   Final    Culture  Setup Time 12/22/2012 22:48   Final    Culture     Final    Value: METHICILLIN RESISTANT STAPHYLOCOCCUS AUREUS     Note: RIFAMPIN AND GENTAMICIN SHOULD NOT BE USED AS SINGLE DRUGS FOR TREATMENT OF STAPH INFECTIONS. CRITICAL RESULT CALLED TO, READ BACK BY AND VERIFIED WITH: JENNIFER FUQUAY @ 1258 12/24/12 BY KRAWS     Note: Gram Stain Report Called to,Read Back By and Verified With: Burnell Blanks @ 1254 12/23/12 BY KRAWS   Report Status 12/25/2012 FINAL   Final    Organism ID, Bacteria METHICILLIN RESISTANT STAPHYLOCOCCUS AUREUS   Final   CULTURE, BLOOD (ROUTINE X 2)     Status: Normal   Collection Time   12/22/12  1:05 PM      Component Value Range Status Comment   Specimen Description BLOOD LEFT HAND   Final    Special Requests BOTTLES DRAWN AEROBIC AND ANAEROBIC [facility]   Final    Culture  Setup Time 12/22/2012 22:48   Final    Culture     Final    Value: STAPHYLOCOCCUS AUREUS     Note: SUSCEPTIBILITIES PERFORMED ON PREVIOUS CULTURE WITHIN THE LAST 5 DAYS.     Note: Gram Stain Report Called to,Read Back By and Verified With: KIM Richardson Dopp @ 1254 12/23/12 BY KRAWS   Report Status 12/25/2012 FINAL   Final   MRSA PCR SCREENING     Status: Abnormal   Collection Time   12/22/12  5:42 PM      Component Value Range Status Comment   MRSA by PCR POSITIVE (*) NEGATIVE Final   CULTURE, BLOOD (ROUTINE X 2)     Status: Normal (Preliminary result)   Collection Time   12/27/12  4:30 AM      Component Value Range Status  Comment   Specimen Description BLOOD RIGHT PICC   Final    Special Requests BOTTLES DRAWN AEROBIC AND ANAEROBIC 5CC   Final    Culture  Setup Time 12/27/2012 13:21   Final    Culture     Final    Value:        BLOOD CULTURE RECEIVED NO GROWTH TO DATE CULTURE WILL BE HELD FOR 5 DAYS BEFORE ISSUING A FINAL NEGATIVE REPORT   Report Status PENDING   Incomplete     Studies/Results: Dg Abd 1 View - Kub  12/11/2012  *RADIOLOGY REPORT*  Clinical Data: Duodenal stricture.  Attempted endoscopy.  DG C-ARM GT 120 MIN,ABDOMEN - 1 VIEW  Technique: 20 minutes and 33 seconds  of fluoroscopy was utilized.  Comparison:  Upper GI 12/09/2012.  Findings: Three images are submitted.  The biliary drain and abdominal drain are in place.  The endoscope remains in the stomach, all three images.  IMPRESSION:  1.  Intraoperative fluoroscopy for guidance of endoscopy.   Original Report Authenticated By: Marin Roberts, M.D.    Ct Abdomen Pelvis W Contrast  12/16/2012  *RADIOLOGY REPORT*  Clinical Data: Small bowel obstruction  CT ABDOMEN AND PELVIS WITH CONTRAST  Technique:  Multidetector CT imaging of the abdomen and pelvis was performed following the standard protocol during bolus administration of intravenous contrast.  Contrast: 80mL OMNIPAQUE IOHEXOL 300 MG/ML  SOLN  Comparison: Abdominal x-ray is of multiple recent base.  Findings: Images which include the lower chest shows small bilateral pleural effusions with bibasilar collapse / consolidation.  NG tube tip is noted in the mid stomach.  There appears to be some wall thickening in the distal stomach, but this may be related to the underdistended state.  Duodenum is opacified.  There is a stent in the proximal jejunum.  Contrast material enters the proximal end of the stent and a very thin string of contrast can be seen to track through the stent lumen in the small bowel beyond the stent.  There is soft tissue or debris filling the stent lumen creating only is a very  thin channel for flow of contrast through the stent.  Small bowel loops distal to the stent are decompressed.  There is a small amount of free fluid in the abdomen. Bowel anatomy is difficult to discern given the fairly substantial amount of adhesions and scarring in this patient with peritoneal carcinomatosis.  There is a right lower quadrant stoma, presumably ileostomy.  The patient appears to be status post omentectomy and probably right hemicolectomy.  A small bowel anastomoses is seen in the right abdomen.  No focal abnormalities seen in the liver or spleen.  Pancreas is unremarkable.  The adrenal glands are normal.  No evidence for hydronephrosis.  Probable cyst noted in the right kidney.  No abdominal aortic aneurysm.  Pigtail catheter is seen coiled in the anterior midline in or just deep to the rectus sheath.  A second to pigtail catheter in the right upper quadrant adjacent to the gallbladder fundus is consistent with a reported history of cholecystostomy.  The gallbladder is decompressed.  Surgical drain is positioned in the left paracolic gutter.  Imaging through the pelvis shows enlarged prostate gland.  The bladder is decompressed.  Left colon is decompressed.  Bone windows reveal no worrisome lytic or sclerotic osseous lesions.  IMPRESSION: The proximal jejunal stent device is patent although only of very thin string of contrast material passes through the stent into the decompressed small bowel distal to the stent. Even with the stent in place, this segment of bowel appears to represent a region of luminal stenosis.  The duodenum proximal to the stent is distended.  Distal stomach appears to have abnormal circumferential wall thickening with some luminal compromise, but no outlet obstruction at this time.   Original Report Authenticated By: Kennith Center, M.D.    Ir Cholan Exist Tube  12/22/2012  *RADIOLOGY REPORT*  Clinical Data:  Peritoneal carcinomatosis and prior bowel surgery for colonic  perforation and small bowel fistula.  The patient is also status post prior percutaneous cholecystostomy tube placement for acalculous cholecystitis.  Assessment of the cholecystostomy tube is performed.  1.  CHOLANGIOGRAM THROUGH PREEXISTING CATHETER 2.  PERCUTANEOUS CHOLECYSTOSTOMY TUBE EXCHANGE  Contrast:  50 ml Omnipaque-300  Fluoroscopy Time: 7.5 minutes.  Procedure:  The procedure, risks, benefits, and alternatives were explained to the patient.  Questions regarding the procedure were encouraged and answered.  The patient understands and consents to the procedure.  The preexisting percutaneous cholecystostomy tube was prepped with Betadine in a sterile fashion, and a sterile drape was applied covering the operative field.  A sterile gown and sterile gloves were used for the procedure.  The preexisting catheter was injected with contrast material under fluoroscopy.  It was then removed over a guidewire.  A 5-French catheter was then introduced over a guidewire and advanced into the lumen of the gallbladder.  Additional contrast injection was performed.  The catheter was further advanced through the cystic duct and into the common bile duct and additional cholangiogram performed through the catheter.  The catheter was then retracted into the lumen of the gallbladder.  A new 10-French multipurpose drainage catheter was advanced over a wire and formed in the gallbladder lumen.  Final catheter position was confirmed with a fluoroscopic spot image obtained after injection of contrast.  The catheter was secured at the skin with a Prolene retention suture and Stat-Lock device.  The catheter was connected to a gravity drainage bag.  Complications:  None  Findings:  Injection of a preexisting drain shows the catheter to lie just outside the lumen of the gallbladder.  Contrast injection does partially fill the gallbladder lumen and demonstrates a significant amount of internal debris and sludge within the gallbladder.   After removal of the preexisting catheter, a percutaneous tract to the gallbladder lumen was able to be catheterized.  Cholangiogram shows very slow and sluggish flow of contrast via the cystic duct into the common bile duct.  A significant amount of debris is present extending into the cystic duct and also within the distal aspect of the common bile duct.  With a catheter further advanced into the CBD, contrast injection does show a patent common bile duct with flow of contrast noted via the ampulla into the duodenum.  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  There is a fistula demonstrated to adjacent small bowel with eventual opacification of several right sided small bowel loops demonstrating poor motility.  An additional fistula extends medially into a space adjacent to the descending duodenum.  Given demonstration of fistulas as well as poor flow of contrast from the gallbladder into the common bile duct, a new cholecystostomy tube was placed and formed at the level of the gallbladder lumen.  This will be left to gravity drainage.  IMPRESSION: The preexisting catheter lies just outside the lumen of the gallbladder.  After recanalizing a tract to the gallbladder lumen, contrast injection demonstrates significant debris in the lumen of the gallbladder and poor flow of contrast into the common bile duct.  Debris was also present in the distal aspect of the CBD. The study also demonstrated complex fistulas communicating with the gallbladder lumen and opacifying both small bowel as well as a tract to a cavity lying adjacent to the descending duodenum.  A new 10-French catheter was formed in the gallbladder and will be left to gravity drainage.   Original Report Authenticated By: Irish Lack, M.D.    Ir Catheter Tube Change  12/22/2012  *RADIOLOGY REPORT*  Clinical Data:  Peritoneal carcinomatosis and prior bowel surgery for colonic  perforation and small bowel fistula.  The patient is also status post  prior percutaneous cholecystostomy tube placement for acalculous cholecystitis.  Assessment of the cholecystostomy tube is performed.  1.  CHOLANGIOGRAM THROUGH PREEXISTING CATHETER 2.  PERCUTANEOUS CHOLECYSTOSTOMY TUBE EXCHANGE  Contrast:  50 ml Omnipaque-300  Fluoroscopy Time: 7.5 minutes.  Procedure:  The procedure, risks, benefits, and alternatives were explained to the patient.  Questions regarding the procedure were encouraged and answered.  The patient understands and consents to the procedure.  The preexisting percutaneous cholecystostomy tube was prepped with Betadine in a sterile fashion, and a sterile drape was applied covering the operative field.  A sterile gown and sterile gloves were used for the procedure.  The preexisting catheter was injected with contrast material under fluoroscopy.  It was then removed over a guidewire.  A 5-French catheter was then introduced over a guidewire and advanced into the lumen of the gallbladder.  Additional contrast injection was performed.  The catheter was further advanced through the cystic duct and into the common bile duct and additional cholangiogram performed through the catheter.  The catheter was then retracted into the lumen of the gallbladder.  A new 10-French multipurpose drainage catheter was advanced over a wire and formed in the gallbladder lumen.  Final catheter position was confirmed with a fluoroscopic spot image obtained after injection of contrast.  The catheter was secured at the skin with a Prolene retention suture and Stat-Lock device.  The catheter was connected to a gravity drainage bag.  Complications:  None  Findings:  Injection of a preexisting drain shows the catheter to lie just outside the lumen of the gallbladder.  Contrast injection does partially fill the gallbladder lumen and demonstrates a significant amount of internal debris and sludge within the gallbladder.   After removal of the preexisting catheter, a percutaneous tract to the gallbladder lumen was able to be catheterized.  Cholangiogram shows very slow and sluggish flow of contrast via the cystic duct into the common bile duct.  A significant amount of debris is present extending into the cystic duct and also within the distal aspect of the common bile duct.  With a catheter further advanced into the CBD, contrast injection does show a patent common bile duct with flow of contrast noted via the ampulla into the duodenum.  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  There is a fistula demonstrated to adjacent small bowel with eventual opacification of several right sided small bowel loops demonstrating poor motility.  An additional fistula extends medially into a space adjacent to the descending duodenum.  Given demonstration of fistulas as well as poor flow of contrast from the gallbladder into the common bile duct, a new cholecystostomy tube was placed and formed at the level of the gallbladder lumen.  This will be left to gravity drainage.  IMPRESSION: The preexisting catheter lies just outside the lumen of the gallbladder.  After recanalizing a tract to the gallbladder lumen, contrast injection demonstrates significant debris in the lumen of the gallbladder and poor flow of contrast into the common bile duct.  Debris was also present in the distal aspect of the CBD. The study also demonstrated complex fistulas communicating with the gallbladder lumen and opacifying both small bowel as well as a tract to a cavity lying adjacent to the descending duodenum.  A new 10-French catheter was formed in the gallbladder and will be left to gravity drainage.   Original Report Authenticated By: Irish Lack, M.D.    Dg Sinus/fist Tube Chk-non  Gi  12/22/2012  *RADIOLOGY REPORT*  Clinical Data:Evaluate for fistula  ABSCESS INJECTION  Fluoroscopy Time: 1.09  minutes  Comparison: CT of the abdomen and pelvis dated 12/16/2012  Findings: Scout radiograph was obtained.  This shows a nasogastric tube within the stomach.  There is a metallic stent identified within the proximal jejunum.  JP drain overlies the left lower quadrant of the abdomen there is a percutaneous cholecystostomy tube within the right abdomen.  Left lower quadrant the JP drain was accessed and 40 ml of omni 300 was injected into the drain.  No significant extravasation of contrast material from the draining identified to suggest residual abscess or fistula formation.  Mild intra vascular extravasation with opacification of the left lower quadrant venous structures noted.  IMPRESSION: 1.  No evidence for residual abscess or fistula formation surrounding the left lower quadrant JP drain.  Findings were discussed with Dr. Michaell Cowing at the time the procedure was performed on 12/22/2012.   Original Report Authenticated By: Signa Kell, M.D.    Dg Chest Port 1 View  12/22/2012  *RADIOLOGY REPORT*  Clinical Data: Chills.  Shortness of breath.  PORTABLE CHEST - 1 VIEW  Comparison: 11/27/2012  Findings: Left Port-A-Cath and right PICC line remain in place, unchanged.  NG tube has been placed into the stomach.  Lungs are clear.  No effusions.  Heart is normal size.  IMPRESSION: No acute cardiopulmonary disease.   Original Report Authenticated By: Charlett Nose, M.D.    Dg Chest Portable 1 View  11/27/2012  *RADIOLOGY REPORT*  Clinical Data: Port-A-Cath and PICC line.  PORTABLE CHEST - 1 VIEW  Comparison: 10/26/2012  Findings: Shallow inspiration.  Mild cardiac enlargement with normal pulmonary vascularity.  There is increased opacity in the left base suggesting left pleural effusion with basilar atelectasis or infiltration.  Tortuous aorta.  Left subclavian Infuse-A-Port remains in place with tip over the mid SVC region.  A right PICC line remains in place with tip in the SVC / RA junction. No pneumothorax.  No  significant change since previous study.  IMPRESSION: Probable left pleural effusion and basilar atelectasis or infiltration.  PICC line and central venous catheter remain unchanged in position.   Original Report Authenticated By: Burman Nieves, M.D.    Dg Ercp  12/13/2012  *RADIOLOGY REPORT*  Clinical Data: Stenotic small bowel segment just distal to the ligament of Treitz.  ERCP  Comparison:  Small bowel series from 12/09/2012  Technique:  Multiple spot images obtained with the fluoroscopic device and submitted for interpretation post-procedure.  ERCP was performed by Dr.  Arlyce Dice.  Findings: Four spot fluoro images are submitted.  I discussed this study by telephone with Dr. Arlyce Dice immediately after the procedure was completed.  The first image demonstrates a small bowel stent over the left abdomen.  The second image shows injection of contrast material which opacifies the dilated loop just proximal to the intraluminal stent. The stent is opacified throughout its entire length and there does appear to be contrast draining from the distal end of the stent into nondilated small bowel.  The third image again shows contrast in the dilated loop proximal to the stent with opacification of the stent lumen down to the distal end of the stent.  The fourth image shows contrast material in the dilated small bowel loop just proximal to the stent.  There is contrast within the stent device with a small amount of contrast visible in the small bowel just distal to the stent.  Comparing  this see image to image 16 of the previous small bowel series, the orientation of the bowel is very similar and confirms that the stent device is in the stenotic segment.  There is also some intraluminal contrast visible within small bowel superimposed on the distal stent which I think is probably some retrograde reflux of contrast back up along the stent, between the stent device and the bowel mucosa.  IMPRESSION: Endoscopic small bowel  stent placement just distal to the ligament of Treitz.  These images are consistent with flow of contrast material from the dilated proximal small bowel through the stent into the decompressed small bowel distal to the stent.  These images were submitted for radiologic interpretation only. Please see the procedural report for the amount of contrast and the fluoroscopy time utilized.   Original Report Authenticated By: Kennith Center, M.D.    Dg Abd Portable 1v  12/25/2012  *RADIOLOGY REPORT*  Clinical Data: NG tube placement  PORTABLE ABDOMEN - 1 VIEW  Comparison: 12/15/2012  Findings: Enteric stent remains in place.  Surgical drain along the left side of abdomen.  Cholecystostomy tube projects over the gallbladder lumen.  NG tube placed with its tip in the fundus of the stomach.  No obvious free intraperitoneal gas.  No disproportionate dilatation of bowel.  IMPRESSION: NG tube tip is in the fundus of the stomach.   Original Report Authenticated By: Jolaine Click, M.D.    Dg Abd Portable 1v  12/15/2012  *RADIOLOGY REPORT*  Clinical Data: NG tube placed  PORTABLE ABDOMEN - 1 VIEW  Comparison: 2:40 hours  Findings: NG tube has been placed with its tip in the fundus of the stomach.  The amount of contrast in the stomach has diminished.  No disproportionate dilatation of bowel.  Bowel stent is stable in the left side of the abdomen.  IMPRESSION: NG tube placed into the stomach.  Diminished contrast in the stomach.   Original Report Authenticated By: Jolaine Click, M.D.    Dg Abd Portable 1v  12/15/2012  *RADIOLOGY REPORT*  Clinical Data: Check contrast progression through the small bowel.  PORTABLE ABDOMEN - 1 VIEW  Comparison: Abdominal radiograph performed 12/14/2012  Findings: Contrast is again seen filling the stomach, and is now better seen filling the duodenum.  There is distension of the fourth segment of the duodenum to 7.2 cm.  This is highly suspicious for occlusion at the level of the jejunal stent.  No  contrast is seen progressing through the jejunal stent.  The visualized bowel gas pattern is grossly unremarkable, though there is a relative paucity of bowel gas within the abdomen.  No definite free abdominal air is identified, though evaluation for free air is suboptimal on a single supine view.  The right-sided cholecystostomy tube is grossly unremarkable in appearance.  Left-sided drains are grossly stable, though the position of the more superior drain has shifted mildly.  No acute osseous abnormalities are identified.  IMPRESSION: Contrast again seen filling the stomach, and better seen filling the duodenum.  Distension of the fourth segment of the duodenum to 7.2 cm.  This is highly suspicious for occlusion at the level of the jejunal stent.  No evidence of contrast progression beyond the jejunal stent.   Original Report Authenticated By: Tonia Ghent, M.D.    Dg Abd Portable 1v  12/14/2012  *RADIOLOGY REPORT*  Clinical Data: Small bowel obstruction; assess for patency of jejunal stent.  PORTABLE ABDOMEN - 1 VIEW  Comparison: Abdominal radiograph performed earlier today at  09:06 p.m.  Findings: Contrast is noted filling the stomach; previously noted contrast within the duodenum is no longer well seen and may have become more dilute, or may have refluxed back into the stomach.  No contrast is seen within the jejunal stent or more distally.  The visualized bowel gas pattern is grossly unremarkable. A right- sided cholecystostomy tube is again noted; left-sided drains are seen.  No acute osseous abnormalities are seen.  Retrocardiac airspace opacification is again noted.  IMPRESSION:  1.  Contrast noted filling the stomach; previously noted contrast within the duodenum is no longer well seen and may have become more dilute, or may have refluxed back into the stomach.  No contrast seen within the jejunal stent or more distally.  To assess for obstruction at the jejunal stent, a formal upper GI series and  small bowel follow-through should be performed, when clinically appropriate.  In the meantime, a follow-up film may be considered in 3-4 hours.  2.  Persistent retrocardiac airspace opacification.   Original Report Authenticated By: Tonia Ghent, M.D.    Dg Abd Portable 1v  12/14/2012  *RADIOLOGY REPORT*  Clinical Data: Small bowel follow-through; follow-up image at 3 hours 10 minutes.  PORTABLE ABDOMEN - 1 VIEW  Comparison: Abdominal radiograph performed earlier today at 06:24 p.m.  Findings: Ingested contrast remains mostly within the stomach, with a small amount of contrast seen extending through the duodenum to the level of the jejunal stent.  No definite contrast is seen extending through the majority of the jejunal stent.  The visualized bowel gas pattern is grossly unremarkable, though there is a relative paucity of bowel gas within the abdomen.  A right-sided cholecystostomy tube is grossly unchanged in appearance.  No definite free intra-abdominal air is identified, though evaluation for free air is suboptimal on supine views.  No acute osseous abnormalities are identified.  Persistent retrocardiac airspace opacification is noted.  IMPRESSION:  1.  Ingested contrast remains mostly within the stomach, with a small amount of contrast seen extending through the duodenum to the level of the jejunal stent.  No definite contrast seen extending through the majority of the jejunal stent.  A follow-up image is planned in 2 hours.  2.  Persistent retrocardiac airspace opacification seen.   Original Report Authenticated By: Tonia Ghent, M.D.    Dg Abd Portable 1v  12/13/2012  *RADIOLOGY REPORT*  Clinical Data: Status post proximal jejunal stent placement for stenotic segment.  PORTABLE ABDOMEN - 1 VIEW  Comparison: ERCP images from earlier the same day  Findings: The contrast material which was present in the dilated small bowel just proximal to the stent is not readily evident on this film.  There is some  contrast posterior wall in the fundus of the stomach. Unfortunately, it is not possible to know whether the contrast seen in the dilated small bowel proximal to the stent on the ERCP has passed through the stent device or refluxed back up into the stomach.  IMPRESSION: No contrast material visible within dilated small bowel proximal to the stent, but since no contrast can be definitely visualized distal to the stent it is unclear whether the contrast refluxed back into the stomach or has migrated distally through the stent and become diluted out in the more distal small bowel loops.   Original Report Authenticated By: Kennith Center, M.D.    Dg Abd Portable 1v  12/09/2012  *RADIOLOGY REPORT*  Clinical Data: Nasogastric tube placement.  PORTABLE ABDOMEN - 1 VIEW  Comparison:  Abdominal radiograph performed 12/07/2012  Findings: The patient's enteric tube is seen ending overlying the body of the stomach.  The visualized bowel gas pattern is grossly unremarkable.  An apparent ostomy site is noted at the right lower quadrant.  A small right-sided IJ catheter is unchanged in position.  Left-sided drains are also grossly stable.  No acute osseous abnormalities are identified.  Mild sclerotic change is seen at the sacroiliac joints.  IMPRESSION: Enteric tube noted ending overlying the body of the stomach.   Original Report Authenticated By: Tonia Ghent, M.D.    Dg Abd Portable 1v  12/07/2012  *RADIOLOGY REPORT*  Clinical Data: For NG tube placement  PORTABLE ABDOMEN - 1 VIEW  Comparison: Abdomen film of 03/26/2012  Findings: The feeding tube enters the stomach and coils with the tip in the fundus of the stomach.  There is abnormal opacity at the left lung base suspicious for atelectasis and effusion.  Pneumonia cannot be excluded.  IMPRESSION:  1.  Feeding tube enters the stomach with the tip coiled within the fundus. 2.  Opacity at the left lung base.  Consider left basilar atelectasis, effusion, and possibly  pneumonia.   Original Report Authenticated By: Dwyane Dee, M.D.    Varney Biles Kayleen Memos Hans Eden  12/14/2012  *RADIOLOGY REPORT*  Clinical Data:  Nausea and vomiting after small bowel wall stent placement.  UPPER GI SERIES WITH KUB  Technique:  Routine upper GI series was performed with thin barium.  Fluoroscopy Time: Zero minutes  Comparison:  None.  Findings: Scout view of the abdomen shows and metallic wall stent in the left abdomen.  Surgical drain is also seen in the left abdomen. Postoperative changes and a pigtail drain are seen in the right abdomen.  Relative paucity of gas overall.  An extremely limited examination was performed at the request of the referring physician.  The patient drank one couple of barium. A single AP supine image shows contrast in the stomach.  IMPRESSION: Contrast remains in the stomach after ingestion of one cup of barium.  The patient will drink another half cup of barium and will be reimaged in 2 hours to assess patency of the small bowel stent.   Original Report Authenticated By: Leanna Battles, M.D.    Dg Kayleen Memos W/small Bowel  12/09/2012  *RADIOLOGY REPORT*  Clinical Data:Persistent vomiting.  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 Thin barium also used.  Subsequently, serial images of the small bowel were obtained including spot views of the terminal ileum.  Fluoroscopy Time: 4.2-minute  Comparison: CT abdomen 11/27/2012  Findings: Thin barium was injected through the indwelling NG tube into  the stomach.  The stomach is mildly dilated.  The duodenum is markedly dilated.  There is a transition point at the ligament of Treitz with a stricture present at the ligament of Treitz.  There is very little contrast in the proximal jejunum.  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  Delayed  images were obtained for 1 hour 15 minutes showing only a small amount of barium in the proximal jejunum.  IMPRESSION: High-grade obstruction at the ligament of Treitz with dilatation of the duodenum.  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  I discussed the findings with Willette Cluster, PA [place] [facility]   Original Report  Authenticated By: Janeece Riggers, M.D.    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  UPPER GI SERIES WITH KUB  Technique:  Routine upper GI series was performed with Omnipaque- 300  Fluoroscopy Time: 1.47 minutes  Comparison:  CT scan 12/16/2012  Findings: Contrast was infused via the NG tube in the stomach. Stomach is unremarkable.  Very little peristaltic action was demonstrated but contrast did enter into the duodenum with the patient on the right side and elevated.  Occasional GE reflux was noted.  There was to and fro movement of contrast in the second portion of the duodenum which was mildly dilated.  Very little contrast entered the duodenal stent but a small amount did pass through it.  Very little contrast was seen beyond the stent.  IMPRESSION:  1.  Mild dilatation of the duodenum with very little contrast seen entering the duodenal stent. 2.  No dilated distal small bowel to suggest obstruction.   Original Report Authenticated By: Rudie Meyer, M.D.    Dg C-arm Gt 120 Min  12/11/2012  *RADIOLOGY REPORT*  Clinical Data: Duodenal stricture.  Attempted endoscopy.  DG C-ARM GT 120 MIN,ABDOMEN - 1 VIEW  Technique: 20 minutes and 33 seconds of fluoroscopy was utilized.  Comparison:  Upper GI 12/09/2012.  Findings: Three images are submitted.  The biliary drain and abdominal drain are in place.  The endoscope remains in the stomach, all three images.  IMPRESSION:  1.  Intraoperative fluoroscopy for guidance of endoscopy.   Original Report  Authenticated By: Marin Roberts, M.D.     Medications: Scheduled Meds:    . antiseptic oral rinse  15 mL Mouth Rinse BID  . Chlorhexidine Gluconate Cloth  6 each Topical Q0600  . enoxaparin (LOVENOX) injection  40 mg Subcutaneous Q24H  . insulin aspart  0-9 Units Subcutaneous Q6H  . lip balm  1 application Topical BID  . metoCLOPramide  10 mg Intravenous QID  . pantoprazole  40 mg Intravenous Q12H  . sodium chloride  10-40 mL Intracatheter Q12H  . vancomycin  1,000 mg Intravenous Q8H      LOS: 9 days   [facility] M.D. [facility] 12/28/2012, 3:08 PM Pager: [phone] If 7PM-7AM, please contact night-coverage [email or web address] Password [email or web address]

## 2012-12-28 NOTE — Progress Notes (Signed)
  Subjective: Failed ng clamping trial after 5.5 hours.  Ng back on suction.  Objective: Vital signs in last 24 hours: Temp:  [97.8 F (36.6 C)-98.3 F (36.8 C)] 98.3 F (36.8 C) (01/12 0525) Pulse Rate:  [85-88] 85  (01/12 0525) Resp:  [20] 20  (01/12 0525) BP: (106-110)/(70-73) 110/70 mmHg (01/12 0525) SpO2:  [96 %-100 %] 96 % (01/12 0525) Last BM Date: 12/27/12  Intake/Output from previous day: 01/11 0701 - 01/12 0700 In: 2985.6 [IV Piggyback:600; UJW:1191.4] Out: 2850 [Urine:550; Emesis/NG output:1700; Drains:300; Stool:300] Intake/Output this shift:    PE: General- In NAD Abdomen-soft, fistula well drained with Eakin's pouch. Other drains remain. Old gastrostomy tube site scar in LUQ  Lab Results:   Basename 12/27/12 0430 12/26/12 0530  WBC 6.5 4.1  HGB 8.6* 7.8*  HCT 26.3* 24.3*  PLT 205 155   BMET  Basename 12/28/12 0320 12/27/12 0430  NA 133* 133*  K 3.8 3.7  CL 99 101  CO2 25 23  GLUCOSE 121* 129*  BUN 23 20  CREATININE 0.71 0.64  CALCIUM 9.9 9.7   PT/INR No results found for this basename: LABPROT:2,INR:2 in the last 72 hours Comprehensive Metabolic Panel:    Component Value Date/Time   NA 133* 12/28/2012 0320   K 3.8 12/28/2012 0320   CL 99 12/28/2012 0320   CO2 25 12/28/2012 0320   BUN 23 12/28/2012 0320   CREATININE 0.71 12/28/2012 0320   GLUCOSE 121* 12/28/2012 0320   CALCIUM 9.9 12/28/2012 0320   AST 24 12/27/2012 0430   ALT 52 12/27/2012 0430   ALKPHOS 519* 12/27/2012 0430   BILITOT 1.0 12/27/2012 0430   PROT 6.2 12/27/2012 0430   ALBUMIN 2.4* 12/27/2012 0430     Studies/Results: No results found.  Anti-infectives: Anti-infectives     Start     Dose/Rate Route Frequency Ordered Stop   12/24/12 1000   vancomycin (VANCOCIN) IVPB 1000 mg/200 mL premix        1,000 mg 200 mL/hr over 60 Minutes Intravenous Every 8 hours 12/24/12 0836     12/23/12 2200   vancomycin (VANCOCIN) IVPB 1000 mg/200 mL premix  Status:  Discontinued        1,000  mg 200 mL/hr over 60 Minutes Intravenous Every 12 hours 12/23/12 1040 12/24/12 0836   12/22/12 1600   vancomycin (VANCOCIN) IVPB 1000 mg/200 mL premix  Status:  Discontinued        1,000 mg 200 mL/hr over 60 Minutes Intravenous Every 8 hours 12/22/12 1539 12/23/12 1040   12/22/12 1600   piperacillin-tazobactam (ZOSYN) IVPB 3.375 g  Status:  Discontinued        3.375 g 12.5 mL/hr over 240 Minutes Intravenous Every 8 hours 12/22/12 1539 12/24/12 1441          Assessment Principal Problem:  *Peritoneal carcinomatosis Active Problems:  ANEMIA  Duodenal obstruction s/p stenting-failed ng clamping trial.  Anemia of chronic disease  Colo-enteric fistula  Acute cholecystitis s/p perc draianage [id number]  Fistula of small intestine      LOS: 9 days   Plan: IR consult for percutaneous gastrostomy tube placement.   [name] 12/28/2012

## 2012-12-29 LAB — DIFFERENTIAL
Basophils Absolute: 0.1 10*3/uL (ref 0.0–0.1)
Basophils Relative: 1 % (ref 0–1)
Eosinophils Absolute: 0.3 10*3/uL (ref 0.0–0.7)
Eosinophils Relative: 3 % (ref 0–5)
Lymphocytes Relative: 22 % (ref 12–46)
Lymphs Abs: 2.1 10*3/uL (ref 0.7–4.0)
Monocytes Absolute: 1.1 10*3/uL — ABNORMAL HIGH (ref 0.1–1.0)
Monocytes Relative: 12 % (ref 3–12)
Neutro Abs: 5.8 10*3/uL (ref 1.7–7.7)
Neutrophils Relative %: 62 % (ref 43–77)

## 2012-12-29 LAB — TRIGLYCERIDES: Triglycerides: 110 mg/dL (ref <150)

## 2012-12-29 LAB — PHOSPHORUS: Phosphorus: 5.3 mg/dL — ABNORMAL HIGH (ref 2.3–4.6)

## 2012-12-29 LAB — COMPREHENSIVE METABOLIC PANEL
ALT: 49 U/L (ref 0–53)
Alkaline Phosphatase: 484 U/L — ABNORMAL HIGH (ref 39–117)
CO2: 25 mEq/L (ref 19–32)
Calcium: 10.4 mg/dL (ref 8.4–10.5)
GFR calc Af Amer: >90 mL/min (ref >90)
GFR calc non Af Amer: >90 mL/min (ref >90)
Glucose, Bld: 120 mg/dL — ABNORMAL HIGH (ref 70–99)
Potassium: 3.8 mEq/L (ref 3.5–5.1)
Sodium: 130 mEq/L — ABNORMAL LOW (ref 135–145)

## 2012-12-29 LAB — GLUCOSE, CAPILLARY
Glucose-Capillary: 139 mg/dL — ABNORMAL HIGH (ref 70–99)
Glucose-Capillary: 142 mg/dL — ABNORMAL HIGH (ref 70–99)
Glucose-Capillary: 142 mg/dL — ABNORMAL HIGH (ref 70–99)

## 2012-12-29 LAB — CBC
MCH: 27.7 pg (ref 26.0–34.0)
MCV: 85.8 fL (ref 78.0–100.0)
Platelets: 258 10*3/uL (ref 150–400)
RDW: 16.3 % — ABNORMAL HIGH (ref 11.5–15.5)

## 2012-12-29 LAB — CHOLESTEROL, TOTAL: Cholesterol: 146 mg/dL (ref 0–200)

## 2012-12-29 LAB — PREALBUMIN: Prealbumin: 29 mg/dL (ref 17.0–34.0)

## 2012-12-29 MED ORDER — FAT EMULSION 20 % IV EMUL
240.0000 mL | INTRAVENOUS | Status: AC
  Administered 2012-12-29: 240 mL via INTRAVENOUS
  Administered 2012-12-30: 10 mL via INTRAVENOUS
  Filled 2012-12-29: qty 250

## 2012-12-29 MED ORDER — TRACE MINERALS CR-CU-F-FE-I-MN-MO-SE-ZN IV SOLN
INTRAVENOUS | Status: AC
  Administered 2012-12-29: 18:00:00 via INTRAVENOUS
  Filled 2012-12-29: qty 2760

## 2012-12-29 NOTE — Progress Notes (Signed)
  Subjective: No change, alert and cooperative.  Objective: Vital signs in last 24 hours: Temp:  [97.3 F (36.3 C)-98.8 F (37.1 C)] 98.8 F (37.1 C) (01/13 0648) Pulse Rate:  [83-99] 83  (01/13 0648) Resp:  [18-20] 18  (01/13 0648) BP: (107-110)/(70-80) 110/78 mmHg (01/13 0648) SpO2:  [99 %-100 %] 100 % (01/13 0648) Last BM Date: 12/27/12  700 ml thru NG yesterday, afebrile, VSS, NPO/TNA Na is up along with Alk Phos  Intake/Output from previous day: 01/12 0701 - 01/13 0700 In: 1212.6 [P.O.:200; TPN:1012.6] Out: 1400 [Emesis/NG output:700; Drains:600; Stool:100] Intake/Output this shift:    General appearance: alert, cooperative and no distress GI: soft, ostomy bag and Eakins pouch same.  Brown colored drainage in cholecystostomy bag tubing.  Lab Results:   [facility] 12/29/12 0355 12/27/12 0430  WBC 9.4 6.5  HGB 9.4* 8.6*  HCT 29.1* 26.3*  PLT 258 205    BMET  Basename 12/29/12 0355 12/28/12 0320  NA 130* 133*  K 3.8 3.8  CL 95* 99  CO2 25 25  GLUCOSE 120* 121*  BUN 27* 23  CREATININE 0.83 0.71  CALCIUM 10.4 9.9   PT/INR No results found for this basename: LABPROT:2,INR:2 in the last 72 hours   Lab 12/29/12 0355 12/27/12 0430 12/25/12 0500  AST 27 24 34  ALT 49 52 63*  ALKPHOS 484* 519* 449*  BILITOT 0.9 1.0 1.2  PROT 7.2 6.2 5.2*  ALBUMIN 2.7* 2.4* 1.9*     Lipase     Component Value Date/Time   LIPASE 35 11/07/2011 1734     Studies/Results: No results found.  Medications:    . antiseptic oral rinse  15 mL Mouth Rinse BID  . enoxaparin (LOVENOX) injection  40 mg Subcutaneous Q24H  . insulin aspart  0-9 Units Subcutaneous Q6H  . lip balm  1 application Topical BID  . metoCLOPramide  10 mg Intravenous QID  . pantoprazole  40 mg Intravenous Q12H  . sodium chloride  10-40 mL Intracatheter Q12H  . vancomycin  1,000 mg Intravenous Q12H    Assessment/Plan Duodenal obstruction s/p stenting-failed ng clamping trial Septic Shock secondary  to MRSA bacteremia-? Line sepsis (has PICC line and Port-A-Cath):  Cholecystitis status post cholecystostomy drain placed at [facility] - IR performed cholecystostomy tube change on 1/6.  Metastatic Adenocarcinoma with signet ring feature, abdominal carcinomatosis, with debulking surgery 12/2011 and 9/2013with post op complications, requiring drainage, and JP drains.  Small bowel obstruction  Duodenal obstruction with Duodenal stent 12/27 by Dr. Arlyce Dice  LLQ drain, LUQ drain, ileostomy, Cholecystostomy drain, and now with NG.  MRSA positive blood culture  Transferred from [facility] for [facility]ology opinion.  Hypertension  GERD (gastroesophageal reflux disease)  Anemia  Plan:  From our standpoint IR gastrostomy tube planned to maintain decompression.  Nothing more to add at this point.  Eakins and ostomy being changed by floor staff.  He was seen by ostomy nurse after admit.     LOS: 10 days    [name] 12/29/2012

## 2012-12-29 NOTE — Progress Notes (Signed)
[facility] Progress Note  Subjective:  Feels ok.  No pain.  Still with a lot of NGT output.  Only was able to be clamped for 5.5 hours before he started vomiting and needed to be placed back to suction.  Objective:  Vital signs in last 24 hours: Temp:  [97.3 F (36.3 C)-98.8 F (37.1 C)] 98.8 F (37.1 C) (01/13 0648) Pulse Rate:  [83-99] 83  (01/13 0648) Resp:  [18-20] 18  (01/13 0648) BP: (107-110)/(70-80) 110/78 mmHg (01/13 0648) SpO2:  [99 %-100 %] 100 % (01/13 0648) Last BM Date: 12/27/12 General:   Alert, Well-developed, in NAD Heart:  Regular rate and rhythm; no murmurs Pulm:  CTAB.  No W/R/R. Abdomen:  Soft, nontender and nondistended.  Several drains and ileostomy noted without much output. Extremities:  Without edema. Neurologic:  Alert and  oriented x4;  grossly normal neurologically. Psych:  Alert and cooperative. Normal mood and affect.  Intake/Output from previous day: 01/12 0701 - 01/13 0700 In: 1212.6 [P.O.:200; TPN:1012.6] Out: 1400 [Emesis/NG output:700; Drains:600; Stool:100]  Lab Results:  [facility] 12/29/12 0355 12/27/12 0430  WBC 9.4 6.5  HGB 9.4* 8.6*  HCT 29.1* 26.3*  PLT 258 205   BMET  Basename 12/29/12 0355 12/28/12 0320 12/27/12 0430  NA 130* 133* 133*  K 3.8 3.8 3.7  CL 95* 99 101  CO2 25 25 23   GLUCOSE 120* 121* 129*  BUN 27* 23 20  CREATININE 0.83 0.71 0.64  CALCIUM 10.4 9.9 9.7   LFT  Basename 12/29/12 0355  PROT 7.2  ALBUMIN 2.7*  AST 27  ALT 49  ALKPHOS 484*  BILITOT 0.9  BILIDIR --  IBILI --   Assessment / Plan: *Peritoneal carcinomatosis. He is s/p small bowel resection and intraperitoneal chemotherapy. 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 Patient seen by [place] for nausea and vomiting last month while at [facility]. He was found by UGI series to have an obstruction at ligament of Treitz. We attempted stent placement but couldn't traverse stricture.  A second attempt 12/13/12 was successful at placing the stent. Clinically patient is still obstructed with high NGT output, little ostomy output (about 100 cc of clear liquid daily). Surgery has reevaluated and no other surgical options left.  [name] doesn't feel patient candidate for chemotherapy at this time.    -? Possibility of re-attempting enteroscopy with dilation of stent and maybe another stent placement; no guarantee that it will work...could be other areas of obstruction more distal, risk of perforation which would obviously worsen him acutely and require surgery that the surgeons may or may not be willing to perform.  There has been discussion of placing a decompression/vent gastrostomy tube via IR (if they are even able/willing to place this) as well to at least be able to remove the NGT even thought he would still require TPN.  Discussed with patient.  Dr. Arlyce Dice will be by to discuss further.     LOS: 10 days   ZEHR, JESSICA D.  12/29/2012, 8:25 AM  Pager number [phone]  The situation was discussed at morning rounds with other [place] gastroenterologist and then discussed with the patient. He continues to have a functional obstruction in the proximal GI tract. He clearly demonstrates very poor gastric emptying. It is unclear whether there is a persistent obstruction at the distal end of the jejunal stent. Obstruction further down in the GI tract cannot be ruled out. I discussed the therapeutic  options with the patient including attempt to revise the jejunal stent. Even if successful, I explained that this did not by any means guarantee that his clinical situation would improve since he has very poor gastric emptying and may have obstruction more distally. In addition, I carefully explained that there is a risk for perforation for a complex endoscopy with placement of additional stenting. Should any perforation occurred, he would be a significantly higher risk for surgical morbidity and  mortality.  The patient wishes to discuss this further with his family before rendering a decision on whether to attempt revision of the jejunal stent.

## 2012-12-29 NOTE — Progress Notes (Signed)
Patient NW:GNFAOZH DMARIO RUSSOM      DOB: 08-Jul-1956      YQM:[id number]   Palliative Medicine Team at [facility] Progress Note    Subjective: Tonio is struggling with is decisions.  He knows that he has few options.  He agrees to go forward with possible revision of his stent with Dr. Arlyce Dice in the am.  I have checked with IR . It is not possible to maximize his anesthesia by doing both procedures together.  I have updated Merel with this information.  We continue to try to discuss plan B.  Hunter needs to check off all options before making future decisions which puts his family at a disadvantage.  He is feeling the weight of being a spouse , and a father who is supporting his daughter and grandson.  We continue to support them by presenting his options and allowing him to make the choices that he can.  He looks a little more drawn in the face today but is giving life all that he has.     Filed Vitals:   12/29/12 1344  BP: 97/69  Pulse: 90  Temp: 98.5 F (36.9 C)  Resp: 20   Physical exam:   Exam deferred.  Patient comfortable    Assessment and plan: 57 year old with metastatic adenocarcinoma with very few options for maintaining nutrition as he as developed an obstruction in the duodenum.  Initial duodenal stent has failed to fully open his GI tract and so he is completely dependent on TPN for nutrition and his NGT for decompressing his stomach.  Currently , we are planning to give one more try at getting the stent to work and then will pursue a gastrotomy tube to vent his stomach.  This will promote dignity and comfort as his disease progresses.  His course has been complicated by MRSA bacteremia which will prevent him from safely keeping his PICC and port.  Once we have stabilized his GI track we will know if we can dc the lines and continue to give antibiotics.  After a line holiday ,  We could then introduce a PICC if he desires to continue to use TPN.  Patient continues to be a  full code but is working through his goals one at at time.  Will continue to support and try to help him talk with his wife and daughter who are his primary support system.   He is trying to do this alone but with them in mind.   1. Full code  2. SBO;  GI to eval for stent revision  At 9:15 am tomorrow.  3.  Disposition:  Faraaz talks about going home.  He has been working with PT and doing well.  We will know better when we see where we are left after tomorrow and after the gastrotomy tube.   Total time 30 min  Greater than 50%  of this time was spent counseling and coordinating care related to the above assessment and plan.  Dedra Matsuo L. [name], MD MBA The Palliative Medicine Team at [facility] Phone: [phone] Pager: [phone]

## 2012-12-29 NOTE — Progress Notes (Signed)
Events noted. Patient clinically stable.  Labs reviewed as well with counts are stable at this point.  I agree with the current efforts by the primary team and the palliative care team to help the patient in defining goals of care.  I still do not think and likely will not be a chemotherapy candidate.  I will continue to be available to answer questions as needed by the family or the any members of the managing team.

## 2012-12-29 NOTE — Progress Notes (Signed)
Physical Therapy Treatment Patient Details Name: [name] MRN: [id number] DOB: 02/09/1956 Today's Date: 12/29/2012 Time: 8413-2440 PT Time Calculation (min): 23 min  PT Assessment / Plan / Recommendation Comments on Treatment Session  pt progressing; pt states his D/C plan depends on his progress     Follow Up Recommendations  SNF;LTACH     Does the patient have the potential to tolerate intense rehabilitation     Barriers to Discharge        Equipment Recommendations  Rolling walker with 5" wheels;Wheelchair (measurements PT)    Recommendations for Other Services    Frequency Min 3X/week   Plan Discharge plan remains appropriate;Frequency remains appropriate    Precautions / Restrictions Precautions Precautions: Fall Precaution Comments: Multiple lines/drains    Pertinent Vitals/Pain    Mobility  Bed Mobility Bed Mobility: Sit to Supine Supine to Sit: With rails;5: Supervision;HOB elevated Details for Bed Mobility Assistance: Pt did not require any physical A to sit upright. Min cues for technique. Transfers Transfers: Sit to Stand;Stand to Sit Sit to Stand: 4: Min assist;With armrests;With upper extremity assist;From chair/3-in-1;From bed (repeated for tasks and strengthening) Stand to Sit: 4: Min assist;With upper extremity assist;With armrests;To chair/3-in-1;4: Min guard Details for Transfer Assistance: Assist to rise and steady with cues for hand placement, safety  Ambulation/Gait Ambulation/Gait Assistance: 1: +2 Total assist Ambulation/Gait: Patient Percentage: 80% Ambulation Distance (Feet): 40 Feet Assistive device: Rolling walker Ambulation/Gait Assistance Details: cues for Rw position, steps length and posture;  Gait Pattern: Step-through pattern;Decreased stride length Gait velocity: decreased, fatigues quickly General Gait Details: pt feels weak during amb, chair follow for safety and due to pt fatigue    Exercises     PT Diagnosis:    PT  Problem List:   PT Treatment Interventions:     PT Goals Acute Rehab PT Goals Time For Goal Achievement: 01/06/13 Potential to Achieve Goals: Good Pt will go Supine/Side to Sit: with modified independence PT Goal: Supine/Side to Sit - Progress: Updated due to goal met Pt will go Sit to Stand: with supervision PT Goal: Sit to Stand - Progress: Progressing toward goal Pt will go Stand to Sit: with supervision PT Goal: Stand to Sit - Progress: Progressing toward goal Pt will Ambulate: 16 - 50 feet;with mod assist;with least restrictive assistive device PT Goal: Ambulate - Progress: Progressing toward goal  Visit Information  Last PT Received On: 12/29/12 Assistance Needed: +2 (for lines/chair) PT/OT Co-Evaluation/Treatment: Yes    Subjective Data  Subjective: Are you going to follow me with the chair? I feel weak Patient Stated Goal: to be able to walk again.    Cognition  Overall Cognitive Status: Appears within functional limits for tasks assessed/performed Arousal/Alertness: Awake/alert Orientation Level: Appears intact for tasks assessed Behavior During Session: [facility] for tasks performed    Balance  Static Standing Balance Static Standing - Balance Support: Bilateral upper extremity supported;During functional activity Static Standing - Level of Assistance: 5: Stand by assistance Static Standing - Comment/# of Minutes: decreased standing tolerance, had to sit to brush teeth  End of Session PT - End of Session Activity Tolerance: Patient tolerated treatment well Patient left: in chair;with call bell/phone within reach   GP     [facility] 12/29/2012, 1:49 PM

## 2012-12-29 NOTE — Progress Notes (Addendum)
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 Percocet (Oxycodone-Acetaminophen)     hallucination   Patient Measurements: Height: 6\' 4"  (193 cm) Weight: 204 lb 5.9 oz (92.7 kg) IBW/kg (Calculated) : 86.8   Vital Signs: Temp: 98.8 F (37.1 C) (01/13 0648) Temp src: Oral (01/13 0648) BP: 110/78 mmHg (01/13 0648) Pulse Rate: 83  (01/13 0648) Intake/Output from previous day: 01/12 0701 - 01/13 0700 In: 1212.6 [P.O.:200; TPN:1012.6] Out: 1400 [Emesis/NG output:700; Drains:600; Stool:100]  Labs:  [facility] 12/29/12 0355 12/27/12 0430  WBC 9.4 6.5  HGB 9.4* 8.6*  HCT 29.1* 26.3*  PLT 258 205  APTT -- --  INR -- --     Basename 12/29/12 0355 12/28/12 0320 12/27/12 0430  NA 130* 133* 133*  K 3.8 3.8 3.7  CL 95* 99 101  CO2 25 25 23   GLUCOSE 120* 121* 129*  BUN 27* 23 20  CREATININE 0.83 0.71 0.64  LABCREA -- -- --  CREAT24HRUR -- -- --  CALCIUM 10.4 9.9 9.7  MG 1.6 1.5 1.6  PHOS 5.3* 4.8* 5.0*  PROT 7.2 -- 6.2  ALBUMIN 2.7* -- 2.4*  AST 27 -- 24  ALT 49 -- 52  ALKPHOS 484* -- 519*  BILITOT 0.9 -- 1.0  BILIDIR -- -- --  IBILI -- -- --  PREALBUMIN -- -- --  TRIG -- -- --  CHOLHDL -- -- --  CHOL -- -- --   Estimated Creatinine Clearance: 122 ml/min (by C-G formula based on Cr of 0.83).    Insulin Requirements in the past 24 hours:  CBG < 150, required 3 units Novolog SSI, 20 units regular insulin in TPN bag.  Per [facility] med list, patient was on SSI and regular insulin in TPN (44.6 units total).    Nutritional Goals:   RD recs (1/8):  Kcal: 2600-2800, Protein: 130-140 gm, Fluid: >2.6L/day.    TPN @  [facility] = 2624 Kcals, 140g Protein, fluid (125 ml/hr over 24 hrs)  Goal rate inpatient: Clinimix E5/20 at 192ml/hr + Lipids MWF to provide Protein 138gm and 2909 kcal MWF, 2429 Kcal TTSS for avg of 2634 kcal  Current Nutrition:    NPO (as of 12/20/12)  TPN rate reduced to on 1/11 to Clinimix E5/20 at 83 ml/hr secondary to elevated phosphorus ( will provide 100 g protein and 1753 kcal/day)  mIVF = NS @ KVO  Assessment: 40 YOM with complex history including terminal peritoneal carcinomatosis s/p chemo.  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  Pt with LLQ drain, LUQ drain, ileostomy, choly drain, NG tube.  Pt transferred to [facility] on 12/20/11 from [facility] for SBO management, N/V, high NG output.  No further plans for chemotherapy or surgery at this time.  Pt developed hypotension and tachycardia and transferred to ICU on 1/6 with MRSA bacteremia.  ID has recommended line removal, but at this time, the pt elects to continue central lines despite risks.   Patient failed NG clamping trial 1/11, IR consulted for PEG placement - awaiting decision, awaiting input from Dr. Christella Hartigan today regarding options for enteral nutrition. GI noted patient would still need TNA so will go ahead and order today's TNA and await further decision from MD.  Labs:  Lytes:  Na remains low (130) - cannot adjust TNA bag.  K wnl, Mag lower end of normal, Phos continues to rise to  5.3 despite TNA rate reduction.   Renal/Hepatic Function:  SCr stable/wnl. ALT/ALT, Tbili wnl, alk phos 484 (slightly improving)  CBGs:  At goal < 150 mg/dl on SSI and insulin in TNA bag Chol/Trigs:  wnl 1/4, 1/13 results pending Prealbumin:  29.8 (1/4), 1/13 results pending  Plan:   Remove electrolytes from Clinimix bag today due to elevated Phos.  Clinimix 5/25 @ goal rate of 115 ml/hr.    MD - with removal of electrolytes from Clinimix, consider alternative therapy to prevent Na+ from dropping down further (IVF with NS, lasix?).  Also, could Alk phos elevation be due to patient's current disease state?  (potentially could be due to TNA too) - Clinimix 5/15 will help.   Will add 30 units of regular insulin to TNA bag today  TNA to contain  IV fat emulsion 20% at 10 ml/hr on MWF only due to ongoing shortage  Standard multivitamins and trace elements on MWF only due to ongoing shortage  TNA labs Monday/Thursdays  Pharmacy will follow up daily  Geoffry Paradise, PharmD, BCPS Pager: [phone] 8:53 AM Pharmacy #: [phone]

## 2012-12-29 NOTE — Progress Notes (Signed)
Patient ID: [name]  male  WJX:[id number]    DOB: Sep 20, 1956    DOA: 12/19/2012  PCP: [name], MD  Assessment/Plan: Principal Problem:  *Peritoneal carcinomatosis Active Problems:  ANEMIA  SBO (small bowel obstruction)  Hypokalemia  Nausea and vomiting in adult  Anemia of chronic disease  Colo-enteric fistula  Acute cholecystitis s/p perc draianage [id number]  Fistula of small intestine  Shock  Sepsis  Septic shock due to Staphylococcus aureus  Pancytopenia  Hypomagnesemia  1. Septic Shock secondary to MRSA bacteremia-? Line sepsis (has PICC line and Port-A-Cath): Less likely from Cholangitis.  - currently on vancomycin. Patient was transferred to ICU a couple of days ago for hypotension and tachycardia. - ID following, recommended removal of PICC line, Port-A-Cath, line holiday and prolonged IV antibiotics. Blood cultures were 2 x 2 positive for MRSA. Repeat blood cultures 1/11 negative to date.  - patient wishes to wait until  feeding options are sorted out before deciding to remove central lines. He is aware of risk of endocarditis. 2. Pancytopenia: Leukopenia and thrombocytopenia have resolved. H/h stable.  3. Hypokalemia and hypomagnesemia: Pharmacy replacing potassium and magnesium. Improved. New hyponatremia-monitor closely. 4. Peritoneal carcinomatosis diagnosed in December 2012 s/p extensive debulking surgery at [facility] twice (1st time January 2013 second time 08/2012) . Patient has had multiple surgical procedures (refer to surgeon's notes). Palliative did goals of care and patient has chosen to proceed with medical treatment options as much as possible. 5. Small bowel obstruction/duodenal obstruction despite stent: Currently conservative management-n.p.o., and TNA. Failed NGT clamping trial after 5-1/2 hours on 1/11. Surgery has consulted IR for PEG placement. Dr. Arlyce Dice has seen today and is for possible stent revision on 1/14.  6. Cholecystitis status post  cholecystostomy drain placed at [facility] - IR performed cholecystostomy tube change on 1/6.  Code Status: Full  Family Communication: Discussed with patient. Disposition Plan: To be determined-not medically ready   Consultants:  Oncology   GI  IR  General surgery.  Critical care medicine.  Palliative care medicine  ID  Procedures:  NG tube  Change of cholecystostomy tube by IR on 1/6  Antibiotics:  IV vancomycin 1/6 >  IV Zosyn 1/6 > 1/8   Subjective: Denies complaints.  Objective: Weight change:   Intake/Output Summary (Last 24 hours) at 12/29/12 1821 Last data filed at 12/29/12 1129  Gross per 24 hour  Intake      0 ml  Output   1030 ml  Net  -1030 ml   Blood pressure 97/69, pulse 90, temperature 98.5 F (36.9 C), temperature source Oral, resp. rate 20, height 6\' 4"  (1.93 m), weight 92.7 kg (204 lb 5.9 oz), SpO2 100.00%.  Physical Exam: General: Alert and awake, oriented x3, not in any acute distress.  CVS: S1-S2 clear, no murmur rubs or gallops Chest: clear to auscultation bilaterally, no wheezing, rales or rhonchi Abdomen: soft nontender, nondistended,RLQ ostomy. RUQ cholecystostomy drain. Upper midline Eakin's pouch. LLQ drain.  Neuro: Cranial nerves II-XII intact, no focal neurological deficits  Lab Results: Basic Metabolic Panel:  Lab 12/29/12 7846 12/28/12 0320  NA 130* 133*  K 3.8 3.8  CL 95* 99  CO2 25 25  GLUCOSE 120* 121*  BUN 27* 23  CREATININE 0.83 0.71  CALCIUM 10.4 9.9  MG 1.6 --  PHOS 5.3* --   Liver Function Tests:  Lab 12/29/12 0355 12/27/12 0430  AST 27 24  ALT 49 52  ALKPHOS 484* 519*  BILITOT 0.9 1.0  PROT  7.2 6.2  ALBUMIN 2.7* 2.4*  CBC:  Lab 12/29/12 0355 12/27/12 0430  WBC 9.4 6.5  NEUTROABS 5.8 --  HGB 9.4* 8.6*  HCT 29.1* 26.3*  MCV 85.8 84.6  PLT 258 205   CBG:  Lab 12/29/12 1124 12/29/12 0535 12/29/12 0029 12/28/12 1826 12/28/12 1130  GLUCAP 176* 142* 139* 140* 162*     Micro  Results: Recent Results (from the past 240 hour(s))  CULTURE, BLOOD (ROUTINE X 2)     Status: Normal   Collection Time   12/22/12 12:55 PM      Component Value Range Status Comment   Specimen Description BLOOD LEFT HAND   Final    Special Requests BOTTLES DRAWN AEROBIC AND ANAEROBIC [facility]   Final    Culture  Setup Time 12/22/2012 22:48   Final    Culture     Final    Value: METHICILLIN RESISTANT STAPHYLOCOCCUS AUREUS     Note: RIFAMPIN AND GENTAMICIN SHOULD NOT BE USED AS SINGLE DRUGS FOR TREATMENT OF STAPH INFECTIONS. CRITICAL RESULT CALLED TO, READ BACK BY AND VERIFIED WITH: JENNIFER FUQUAY @ 1258 12/24/12 BY KRAWS     Note: Gram Stain Report Called to,Read Back By and Verified With: Burnell Blanks @ 1254 12/23/12 BY KRAWS   Report Status 12/25/2012 FINAL   Final    Organism ID, Bacteria METHICILLIN RESISTANT STAPHYLOCOCCUS AUREUS   Final   CULTURE, BLOOD (ROUTINE X 2)     Status: Normal   Collection Time   12/22/12  1:05 PM      Component Value Range Status Comment   Specimen Description BLOOD LEFT HAND   Final    Special Requests BOTTLES DRAWN AEROBIC AND ANAEROBIC [facility]   Final    Culture  Setup Time 12/22/2012 22:48   Final    Culture     Final    Value: STAPHYLOCOCCUS AUREUS     Note: SUSCEPTIBILITIES PERFORMED ON PREVIOUS CULTURE WITHIN THE LAST 5 DAYS.     Note: Gram Stain Report Called to,Read Back By and Verified With: KIM Richardson Dopp @ 1254 12/23/12 BY KRAWS   Report Status 12/25/2012 FINAL   Final   MRSA PCR SCREENING     Status: Abnormal   Collection Time   12/22/12  5:42 PM      Component Value Range Status Comment   MRSA by PCR POSITIVE (*) NEGATIVE Final   CULTURE, BLOOD (ROUTINE X 2)     Status: Normal (Preliminary result)   Collection Time   12/27/12  4:30 AM      Component Value Range Status Comment   Specimen Description BLOOD RIGHT PICC   Final    Special Requests BOTTLES DRAWN AEROBIC AND ANAEROBIC 5CC   Final    Culture  Setup Time 12/27/2012 13:21   Final    Culture     Final     Value:        BLOOD CULTURE RECEIVED NO GROWTH TO DATE CULTURE WILL BE HELD FOR 5 DAYS BEFORE ISSUING A FINAL NEGATIVE REPORT   Report Status PENDING   Incomplete     Studies/Results: Dg Abd 1 View - Kub  12/11/2012  *RADIOLOGY REPORT*  Clinical Data: Duodenal stricture.  Attempted endoscopy.  DG C-ARM GT 120 MIN,ABDOMEN - 1 VIEW  Technique: 20 minutes and 33 seconds of fluoroscopy was utilized.  Comparison:  Upper GI 12/09/2012.  Findings: Three images are submitted.  The biliary drain and abdominal drain are in place.  The endoscope remains in  the stomach, all three images.  IMPRESSION:  1.  Intraoperative fluoroscopy for guidance of endoscopy.   Original Report Authenticated By: Marin Roberts, M.D.    Ct Abdomen Pelvis W Contrast  12/16/2012  *RADIOLOGY REPORT*  Clinical Data: Small bowel obstruction  CT ABDOMEN AND PELVIS WITH CONTRAST  Technique:  Multidetector CT imaging of the abdomen and pelvis was performed following the standard protocol during bolus administration of intravenous contrast.  Contrast: 80mL OMNIPAQUE IOHEXOL 300 MG/ML  SOLN  Comparison: Abdominal x-ray is of multiple recent base.  Findings: Images which include the lower chest shows small bilateral pleural effusions with bibasilar collapse / consolidation.  NG tube tip is noted in the mid stomach.  There appears to be some wall thickening in the distal stomach, but this may be related to the underdistended state.  Duodenum is opacified.  There is a stent in the proximal jejunum.  Contrast material enters the proximal end of the stent and a very thin string of contrast can be seen to track through the stent lumen in the small bowel beyond the stent.  There is soft tissue or debris filling the stent lumen creating only is a very thin channel for flow of contrast through the stent.  Small bowel loops distal to the stent are decompressed.  There is a small amount of free fluid in the abdomen. Bowel anatomy is difficult to  discern given the fairly substantial amount of adhesions and scarring in this patient with peritoneal carcinomatosis.  There is a right lower quadrant stoma, presumably ileostomy.  The patient appears to be status post omentectomy and probably right hemicolectomy.  A small bowel anastomoses is seen in the right abdomen.  No focal abnormalities seen in the liver or spleen.  Pancreas is unremarkable.  The adrenal glands are normal.  No evidence for hydronephrosis.  Probable cyst noted in the right kidney.  No abdominal aortic aneurysm.  Pigtail catheter is seen coiled in the anterior midline in or just deep to the rectus sheath.  A second to pigtail catheter in the right upper quadrant adjacent to the gallbladder fundus is consistent with a reported history of cholecystostomy.  The gallbladder is decompressed.  Surgical drain is positioned in the left paracolic gutter.  Imaging through the pelvis shows enlarged prostate gland.  The bladder is decompressed.  Left colon is decompressed.  Bone windows reveal no worrisome lytic or sclerotic osseous lesions.  IMPRESSION: The proximal jejunal stent device is patent although only of very thin string of contrast material passes through the stent into the decompressed small bowel distal to the stent. Even with the stent in place, this segment of bowel appears to represent a region of luminal stenosis.  The duodenum proximal to the stent is distended.  Distal stomach appears to have abnormal circumferential wall thickening with some luminal compromise, but no outlet obstruction at this time.   Original Report Authenticated By: Kennith Center, M.D.    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  The patient is also status post prior percutaneous cholecystostomy tube placement for acalculous cholecystitis.  Assessment of the cholecystostomy tube is  performed.  1.  CHOLANGIOGRAM THROUGH PREEXISTING CATHETER 2.  PERCUTANEOUS CHOLECYSTOSTOMY TUBE EXCHANGE  Contrast:  50 ml Omnipaque-300  Fluoroscopy Time: 7.5 minutes.  Procedure:  The procedure, risks, benefits, and alternatives were explained to the patient.  Questions regarding the procedure were  encouraged and answered.  The patient understands and consents to the procedure.  The preexisting percutaneous cholecystostomy tube was prepped with Betadine in a sterile fashion, and a sterile drape was applied covering the operative field.  A sterile gown and sterile gloves were used for the procedure.  The preexisting catheter was injected with contrast material under fluoroscopy.  It was then removed over a guidewire.  A 5-French catheter was then introduced over a guidewire and advanced into the lumen of the gallbladder.  Additional contrast injection was performed.  The catheter was further advanced through the cystic duct and into the common bile duct and additional cholangiogram performed through the catheter.  The catheter was then retracted into the lumen of the gallbladder.  A new 10-French multipurpose drainage catheter was advanced over a wire and formed in the gallbladder lumen.  Final catheter position was confirmed with a fluoroscopic spot image obtained after injection of contrast.  The catheter was secured at the skin with a Prolene retention suture and Stat-Lock device.  The catheter was connected to a gravity drainage bag.  Complications:  None  Findings:  Injection of a preexisting drain shows the catheter to lie just outside the lumen of the gallbladder.  Contrast injection does partially fill the gallbladder lumen and demonstrates a significant amount of internal debris and sludge within the gallbladder.  After removal of the preexisting catheter, a percutaneous tract to the gallbladder lumen was able to be catheterized.  Cholangiogram shows very slow and sluggish flow of contrast via the cystic  duct into the common bile duct.  A significant amount of debris is present extending into the cystic duct and also within the distal aspect of the common bile duct.  With a catheter further advanced into the CBD, contrast injection does show a patent common bile duct with flow of contrast noted via the ampulla into the duodenum.  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  There is a fistula demonstrated to adjacent small bowel with eventual opacification of several right sided small bowel loops demonstrating poor motility.  An additional fistula extends medially into a space adjacent to the descending duodenum.  Given demonstration of fistulas as well as poor flow of contrast from the gallbladder into the common bile duct, a new cholecystostomy tube was placed and formed at the level of the gallbladder lumen.  This will be left to gravity drainage.  IMPRESSION: The preexisting catheter lies just outside the lumen of the gallbladder.  After recanalizing a tract to the gallbladder lumen, contrast injection demonstrates significant debris in the lumen of the gallbladder and poor flow of contrast into the common bile duct.  Debris was also present in the distal aspect of the CBD. The study also demonstrated complex fistulas communicating with the gallbladder lumen and opacifying both small bowel as well as a tract to a cavity lying adjacent to the descending duodenum.  A new 10-French catheter was formed in the gallbladder and will be left to gravity drainage.   Original Report Authenticated By: Irish Lack, M.D.    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  The patient is also status post prior percutaneous cholecystostomy tube placement for acalculous cholecystitis.  Assessment of the cholecystostomy tube is  performed.  1.  CHOLANGIOGRAM THROUGH PREEXISTING CATHETER 2.  PERCUTANEOUS CHOLECYSTOSTOMY TUBE EXCHANGE  Contrast:  50 ml Omnipaque-300  Fluoroscopy Time: 7.5 minutes.  Procedure:  The procedure, risks, benefits, and alternatives were explained to the patient.  Questions regarding the procedure were encouraged and answered.  The patient understands and consents to the procedure.  The preexisting percutaneous cholecystostomy tube was prepped with Betadine in a sterile fashion, and a sterile drape was applied covering the operative field.  A sterile gown and sterile gloves were used for the procedure.  The preexisting catheter was injected with contrast material under fluoroscopy.  It was then removed over a guidewire.  A 5-French catheter was then introduced over a guidewire and advanced into the lumen of the gallbladder.  Additional contrast injection was performed.  The catheter was further advanced through the cystic duct and into the common bile duct and additional cholangiogram performed through the catheter.  The catheter was then retracted into the lumen of the gallbladder.  A new 10-French multipurpose drainage catheter was advanced over a wire and formed in the gallbladder lumen.  Final catheter position was confirmed with a fluoroscopic spot image obtained after injection of contrast.  The catheter was secured at the skin with a Prolene retention suture and Stat-Lock device.  The catheter was connected to a gravity drainage bag.  Complications:  None  Findings:  Injection of a preexisting drain shows the catheter to lie just outside the lumen of the gallbladder.  Contrast injection does partially fill the gallbladder lumen and demonstrates a significant amount of internal debris and sludge within the gallbladder.  After removal of the preexisting catheter, a percutaneous tract to the gallbladder lumen was able to be catheterized.  Cholangiogram shows very slow and sluggish flow of contrast via the cystic  duct into the common bile duct.  A significant amount of debris is present extending into the cystic duct and also within the distal aspect of the common bile duct.  With a catheter further advanced into the CBD, contrast injection does show a patent common bile duct with flow of contrast noted via the ampulla into the duodenum.  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  There is a fistula demonstrated to adjacent small bowel with eventual opacification of several right sided small bowel loops demonstrating poor motility.  An additional fistula extends medially into a space adjacent to the descending duodenum.  Given demonstration of fistulas as well as poor flow of contrast from the gallbladder into the common bile duct, a new cholecystostomy tube was placed and formed at the level of the gallbladder lumen.  This will be left to gravity drainage.  IMPRESSION: The preexisting catheter lies just outside the lumen of the gallbladder.  After recanalizing a tract to the gallbladder lumen, contrast injection demonstrates significant debris in the lumen of the gallbladder and poor flow of contrast into the common bile duct.  Debris was also present in the distal aspect of the CBD. The study also demonstrated complex fistulas communicating with the gallbladder lumen and opacifying both small bowel as well as a tract to a cavity lying adjacent to the descending duodenum.  A new 10-French catheter was formed in the gallbladder and will be left to gravity drainage.   Original Report Authenticated By: Irish Lack, M.D.    Dg Sinus/fist Tube Chk-non Gi  12/22/2012  *RADIOLOGY REPORT*  Clinical Data:Evaluate for fistula  ABSCESS INJECTION  Fluoroscopy Time: 1.09 minutes  Comparison: CT of the abdomen and pelvis dated 12/16/2012  Findings: Scout radiograph was obtained.  This shows a nasogastric tube within the stomach.  There is a metallic stent  identified within the proximal jejunum.  JP drain overlies the left lower quadrant of the abdomen there is a percutaneous cholecystostomy tube within the right abdomen.  Left lower quadrant the JP drain was accessed and 40 ml of omni 300 was injected into the drain.  No significant extravasation of contrast material from the draining identified to suggest residual abscess or fistula formation.  Mild intra vascular extravasation with opacification of the left lower quadrant venous structures noted.  IMPRESSION: 1.  No evidence for residual abscess or fistula formation surrounding the left lower quadrant JP drain.  Findings were discussed with Dr. Michaell Cowing at the time the procedure was performed on 12/22/2012.   Original Report Authenticated By: Signa Kell, M.D.    Dg Chest Port 1 View  12/22/2012  *RADIOLOGY REPORT*  Clinical Data: Chills.  Shortness of breath.  PORTABLE CHEST - 1 VIEW  Comparison: 11/27/2012  Findings: Left Port-A-Cath and right PICC line remain in place, unchanged.  NG tube has been placed into the stomach.  Lungs are clear.  No effusions.  Heart is normal size.  IMPRESSION: No acute cardiopulmonary disease.   Original Report Authenticated By: Charlett Nose, M.D.    Dg Chest Portable 1 View  11/27/2012  *RADIOLOGY REPORT*  Clinical Data: Port-A-Cath and PICC line.  PORTABLE CHEST - 1 VIEW  Comparison: 10/26/2012  Findings: Shallow inspiration.  Mild cardiac enlargement with normal pulmonary vascularity.  There is increased opacity in the left base suggesting left pleural effusion with basilar atelectasis or infiltration.  Tortuous aorta.  Left subclavian Infuse-A-Port remains in place with tip over the mid SVC region.  A right PICC line remains in place with tip in the SVC / RA junction. No pneumothorax.  No significant change since previous study.  IMPRESSION: Probable left pleural effusion and basilar atelectasis or infiltration.  PICC line and central venous catheter remain unchanged in  position.   Original Report Authenticated By: Burman Nieves, M.D.    Dg Ercp  12/13/2012  *RADIOLOGY REPORT*  Clinical Data: Stenotic small bowel segment just distal to the ligament of Treitz.  ERCP  Comparison:  Small bowel series from 12/09/2012  Technique:  Multiple spot images obtained with the fluoroscopic device and submitted for interpretation post-procedure.  ERCP was performed by Dr.  Arlyce Dice.  Findings: Four spot fluoro images are submitted.  I discussed this study by telephone with Dr. Arlyce Dice immediately after the procedure was completed.  The first image demonstrates a small bowel stent over the left abdomen.  The second image shows injection of contrast material which opacifies the dilated loop just proximal to the intraluminal stent. The stent is opacified throughout its entire length and there does appear to be contrast draining from the distal end of the stent into nondilated small bowel.  The third image again shows contrast in the dilated loop proximal to the stent with opacification of the stent lumen down to the distal end of the stent.  The fourth image shows contrast material in the dilated small bowel loop just proximal to the stent.  There is contrast within the stent device with a small amount of contrast visible in the small bowel just distal to the stent.  Comparing this see image to image 16 of the previous small bowel series, the orientation of the bowel is very similar and confirms that the stent device is in the stenotic  segment.  There is also some intraluminal contrast visible within small bowel superimposed on the distal stent which I think is probably some retrograde reflux of contrast back up along the stent, between the stent device and the bowel mucosa.  IMPRESSION: Endoscopic small bowel stent placement just distal to the ligament of Treitz.  These images are consistent with flow of contrast material from the dilated proximal small bowel through the stent into the  decompressed small bowel distal to the stent.  These images were submitted for radiologic interpretation only. Please see the procedural report for the amount of contrast and the fluoroscopy time utilized.   Original Report Authenticated By: Kennith Center, M.D.    Dg Abd Portable 1v  12/25/2012  *RADIOLOGY REPORT*  Clinical Data: NG tube placement  PORTABLE ABDOMEN - 1 VIEW  Comparison: 12/15/2012  Findings: Enteric stent remains in place.  Surgical drain along the left side of abdomen.  Cholecystostomy tube projects over the gallbladder lumen.  NG tube placed with its tip in the fundus of the stomach.  No obvious free intraperitoneal gas.  No disproportionate dilatation of bowel.  IMPRESSION: NG tube tip is in the fundus of the stomach.   Original Report Authenticated By: Jolaine Click, M.D.    Dg Abd Portable 1v  12/15/2012  *RADIOLOGY REPORT*  Clinical Data: NG tube placed  PORTABLE ABDOMEN - 1 VIEW  Comparison: 2:40 hours  Findings: NG tube has been placed with its tip in the fundus of the stomach.  The amount of contrast in the stomach has diminished.  No disproportionate dilatation of bowel.  Bowel stent is stable in the left side of the abdomen.  IMPRESSION: NG tube placed into the stomach.  Diminished contrast in the stomach.   Original Report Authenticated By: Jolaine Click, M.D.    Dg Abd Portable 1v  12/15/2012  *RADIOLOGY REPORT*  Clinical Data: Check contrast progression through the small bowel.  PORTABLE ABDOMEN - 1 VIEW  Comparison: Abdominal radiograph performed 12/14/2012  Findings: Contrast is again seen filling the stomach, and is now better seen filling the duodenum.  There is distension of the fourth segment of the duodenum to 7.2 cm.  This is highly suspicious for occlusion at the level of the jejunal stent.  No contrast is seen progressing through the jejunal stent.  The visualized bowel gas pattern is grossly unremarkable, though there is a relative paucity of bowel gas within the  abdomen.  No definite free abdominal air is identified, though evaluation for free air is suboptimal on a single supine view.  The right-sided cholecystostomy tube is grossly unremarkable in appearance.  Left-sided drains are grossly stable, though the position of the more superior drain has shifted mildly.  No acute osseous abnormalities are identified.  IMPRESSION: Contrast again seen filling the stomach, and better seen filling the duodenum.  Distension of the fourth segment of the duodenum to 7.2 cm.  This is highly suspicious for occlusion at the level of the jejunal stent.  No evidence of contrast progression beyond the jejunal stent.   Original Report Authenticated By: Tonia Ghent, M.D.    Dg Abd Portable 1v  12/14/2012  *RADIOLOGY REPORT*  Clinical Data: Small bowel obstruction; assess for patency of jejunal stent.  PORTABLE ABDOMEN - 1 VIEW  Comparison: Abdominal radiograph performed earlier today at 09:06 p.m.  Findings: Contrast is noted filling the stomach; previously noted contrast within the duodenum is no longer well seen and may have become more dilute, or may have  refluxed back into the stomach.  No contrast is seen within the jejunal stent or more distally.  The visualized bowel gas pattern is grossly unremarkable. A right- sided cholecystostomy tube is again noted; left-sided drains are seen.  No acute osseous abnormalities are seen.  Retrocardiac airspace opacification is again noted.  IMPRESSION:  1.  Contrast noted filling the stomach; previously noted contrast within the duodenum is no longer well seen and may have become more dilute, or may have refluxed back into the stomach.  No contrast seen within the jejunal stent or more distally.  To assess for obstruction at the jejunal stent, a formal upper GI series and small bowel follow-through should be performed, when clinically appropriate.  In the meantime, a follow-up film may be considered in 3-4 hours.  2.  Persistent retrocardiac  airspace opacification.   Original Report Authenticated By: Tonia Ghent, M.D.    Dg Abd Portable 1v  12/14/2012  *RADIOLOGY REPORT*  Clinical Data: Small bowel follow-through; follow-up image at 3 hours 10 minutes.  PORTABLE ABDOMEN - 1 VIEW  Comparison: Abdominal radiograph performed earlier today at 06:24 p.m.  Findings: Ingested contrast remains mostly within the stomach, with a small amount of contrast seen extending through the duodenum to the level of the jejunal stent.  No definite contrast is seen extending through the majority of the jejunal stent.  The visualized bowel gas pattern is grossly unremarkable, though there is a relative paucity of bowel gas within the abdomen.  A right-sided cholecystostomy tube is grossly unchanged in appearance.  No definite free intra-abdominal air is identified, though evaluation for free air is suboptimal on supine views.  No acute osseous abnormalities are identified.  Persistent retrocardiac airspace opacification is noted.  IMPRESSION:  1.  Ingested contrast remains mostly within the stomach, with a small amount of contrast seen extending through the duodenum to the level of the jejunal stent.  No definite contrast seen extending through the majority of the jejunal stent.  A follow-up image is planned in 2 hours.  2.  Persistent retrocardiac airspace opacification seen.   Original Report Authenticated By: Tonia Ghent, M.D.    Dg Abd Portable 1v  12/13/2012  *RADIOLOGY REPORT*  Clinical Data: Status post proximal jejunal stent placement for stenotic segment.  PORTABLE ABDOMEN - 1 VIEW  Comparison: ERCP images from earlier the same day  Findings: The contrast material which was present in the dilated small bowel just proximal to the stent is not readily evident on this film.  There is some contrast posterior wall in the fundus of the stomach. Unfortunately, it is not possible to know whether the contrast seen in the dilated small bowel proximal to the stent on  the ERCP has passed through the stent device or refluxed back up into the stomach.  IMPRESSION: No contrast material visible within dilated small bowel proximal to the stent, but since no contrast can be definitely visualized distal to the stent it is unclear whether the contrast refluxed back into the stomach or has migrated distally through the stent and become diluted out in the more distal small bowel loops.   Original Report Authenticated By: Kennith Center, M.D.    Dg Abd Portable 1v  12/09/2012  *RADIOLOGY REPORT*  Clinical Data: Nasogastric tube placement.  PORTABLE ABDOMEN - 1 VIEW  Comparison: Abdominal radiograph performed 12/07/2012  Findings: The patient's enteric tube is seen ending overlying the body of the stomach.  The visualized bowel gas pattern is grossly unremarkable.  An  apparent ostomy site is noted at the right lower quadrant.  A small right-sided IJ catheter is unchanged in position.  Left-sided drains are also grossly stable.  No acute osseous abnormalities are identified.  Mild sclerotic change is seen at the sacroiliac joints.  IMPRESSION: Enteric tube noted ending overlying the body of the stomach.   Original Report Authenticated By: Tonia Ghent, M.D.    Dg Abd Portable 1v  12/07/2012  *RADIOLOGY REPORT*  Clinical Data: For NG tube placement  PORTABLE ABDOMEN - 1 VIEW  Comparison: Abdomen film of 03/26/2012  Findings: The feeding tube enters the stomach and coils with the tip in the fundus of the stomach.  There is abnormal opacity at the left lung base suspicious for atelectasis and effusion.  Pneumonia cannot be excluded.  IMPRESSION:  1.  Feeding tube enters the stomach with the tip coiled within the fundus. 2.  Opacity at the left lung base.  Consider left basilar atelectasis, effusion, and possibly pneumonia.   Original Report Authenticated By: Dwyane Dee, M.D.    Varney Biles Kayleen Memos [place]  12/14/2012  *RADIOLOGY REPORT*  Clinical Data:  Nausea and vomiting after small bowel wall  stent placement.  UPPER GI SERIES WITH KUB  Technique:  Routine upper GI series was performed with thin barium.  Fluoroscopy Time: Zero minutes  Comparison:  None.  Findings: Scout view of the abdomen shows and metallic wall stent in the left abdomen.  Surgical drain is also seen in the left abdomen. Postoperative changes and a pigtail drain are seen in the right abdomen.  Relative paucity of gas overall.  An extremely limited examination was performed at the request of the referring physician.  The patient drank one couple of barium. A single AP supine image shows contrast in the stomach.  IMPRESSION: Contrast remains in the stomach after ingestion of one cup of barium.  The patient will drink another half cup of barium and will be reimaged in 2 hours to assess patency of the small bowel stent.   Original Report Authenticated By: Leanna Battles, M.D.    Dg Kayleen Memos W/small Bowel  12/09/2012  *RADIOLOGY REPORT*  Clinical Data:Persistent vomiting.  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 Thin barium also used.  Subsequently, serial images of the small bowel were obtained including spot views of the terminal ileum.  Fluoroscopy Time: 4.2-minute  Comparison: CT abdomen 11/27/2012  Findings: Thin barium was injected through the indwelling NG tube into  the stomach.  The stomach is mildly dilated.  The duodenum is markedly dilated.  There is a transition point at the ligament of Treitz with a stricture present at the ligament of Treitz.  There is very little contrast in the proximal jejunum.  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  Delayed images were obtained for 1 hour 15 minutes showing only a small amount of barium in the proximal jejunum.  IMPRESSION: High-grade obstruction at the ligament of Treitz with  dilatation of the duodenum.  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  I discussed the findings with Willette Cluster, PA [facility]   Original Report Authenticated By: Janeece Riggers, M.D.    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  UPPER GI SERIES WITH KUB  Technique:  Routine upper GI series was performed with Omnipaque- 300  Fluoroscopy Time: 1.47 minutes  Comparison:  CT scan 12/16/2012  Findings: Contrast was infused via the NG tube in the stomach. Stomach is unremarkable.  Very little peristaltic action was demonstrated but contrast did enter into the duodenum with the patient on the right side and elevated.  Occasional GE reflux was noted.  There was to and fro movement of contrast in the second portion of the duodenum which was mildly dilated.  Very little contrast entered the duodenal stent but a small amount did pass through it.  Very little contrast was seen beyond the stent.  IMPRESSION:  1.  Mild dilatation of the duodenum with very little contrast seen entering the duodenal stent. 2.  No dilated distal small bowel to suggest obstruction.   Original Report Authenticated By: Rudie Meyer, M.D.    Dg C-arm Gt 120 Min  12/11/2012  *RADIOLOGY REPORT*  Clinical Data: Duodenal stricture.  Attempted endoscopy.  DG C-ARM GT 120 MIN,ABDOMEN - 1 VIEW  Technique: 20 minutes and 33 seconds of fluoroscopy was utilized.  Comparison:  Upper GI 12/09/2012.  Findings: Three images are submitted.  The biliary drain and abdominal drain are in place.  The endoscope remains in the stomach, all three images.  IMPRESSION:  1.  Intraoperative fluoroscopy for guidance of endoscopy.   Original Report Authenticated By: Marin Roberts, M.D.     Medications: Scheduled Meds:    . antiseptic oral rinse  15 mL Mouth Rinse BID  . enoxaparin (LOVENOX) injection  40 mg  Subcutaneous Q24H  . insulin aspart  0-9 Units Subcutaneous Q6H  . lip balm  1 application Topical BID  . metoCLOPramide  10 mg Intravenous QID  . pantoprazole  40 mg Intravenous Q12H  . sodium chloride  10-40 mL Intracatheter Q12H  . vancomycin  1,000 mg Intravenous Q12H      LOS: 10 days   [facility] M.D. [facility] 12/29/2012, 6:21 PM Pager: [phone] If 7PM-7AM, please contact night-coverage [email or web address] Password [email or web address]

## 2012-12-29 NOTE — Progress Notes (Signed)
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  Specific aspects of clinical management are critical to optimizing the outcome of patients with SAB.  Therefore, the [facility] Antimicrobial Management Team ([facility]) have initiated an intervention aimed at improving the management of SAB at [facility].    Events noted, patient still with lines, awaiting feeding tube options before possibly proceeding to line removals.    Will need 6 weeks of IV therapy after line removals and after assuring clearance.    Repeat (1) blood culture negative to date.    Staci Righter, MD

## 2012-12-29 NOTE — Progress Notes (Signed)
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   Agree w trying to switch to palliative Gtube - may not be feasible w hostile abdomen & carcinomatosis

## 2012-12-29 NOTE — Progress Notes (Signed)
Occupational Therapy Treatment Patient Details Name: [name] MRN: [id number] DOB: 1956/02/02 Today's Date: 12/29/2012 Time: 4696-2952 OT Time Calculation (min): 25 min  OT Assessment / Plan / Recommendation Comments on Treatment Session Pt mobilizing well but continues to be limited by fatigue.    Follow Up Recommendations  SNF;LTACH    Barriers to Discharge       Equipment Recommendations  3 in 1 bedside comode    Recommendations for Other Services    Frequency Min 2X/week   Plan Discharge plan remains appropriate    Precautions / Restrictions Precautions Precautions: Fall Precaution Comments: Multiple lines/drains    Pertinent Vitals/Pain Pt denied pain.    ADL  Grooming: Teeth care;Min guard;Minimal assistance Where Assessed - Grooming: Supported sitting;Supported standing Toilet Transfer: Minimal Dentist Method: Sit to Barista: Other (comment) (from low chair with armrests.) Transfers/Ambulation Related to ADLs: Pt ambulated ~40 feet with min A and RW. ADL Comments: Attepmted to have pt brush his teeth while standing at sink level. Pt became fatigued and had to sit at the beginning of task. Required assist to undo toothpaste cap. Reporting numbness in hands and feet.    OT Diagnosis:    OT Problem List:   OT Treatment Interventions:     OT Goals ADL Goals ADL Goal: Grooming - Progress: Progressing toward goals ADL Goal: Toilet Transfer - Progress: Progressing toward goals  Visit Information  Last OT Received On: 12/29/12 Assistance Needed: +2 (for lines and drains.) PT/OT Co-Evaluation/Treatment: Yes    Subjective Data  Subjective: I feel so weak. I need to sit.   Prior Functioning       Cognition  Overall Cognitive Status: Appears within functional limits for tasks assessed/performed Arousal/Alertness: Awake/alert Orientation Level: Appears intact for tasks assessed Behavior During Session: [facility] for  tasks performed    Mobility  Shoulder Instructions Bed Mobility Supine to Sit: With rails;5: Supervision;HOB elevated Details for Bed Mobility Assistance: Pt did not require any physical A to sit upright. Min cues for technique. Transfers Sit to Stand: 4: Min assist;With upper extremity assist;With armrests;From chair/3-in-1;From bed Stand to Sit: 4: Min assist;With upper extremity assist;With armrests;To chair/3-in-1;4: Min guard Details for Transfer Assistance: Assist to rise and steady with cues for hand placement, safety        Exercises      Balance Static Standing Balance Static Standing - Balance Support: Bilateral upper extremity supported;During functional activity Static Standing - Level of Assistance: 5: Stand by assistance Static Standing - Comment/# of Minutes: Pt only able to tolerate a static standing position for a few seconds before requiring RB.   End of Session OT - End of Session Activity Tolerance: Patient limited by fatigue Patient left: in chair;with call bell/phone within reach  GO     Adora Yeh A OTR/L [phone] 12/29/2012, 12:23 PM

## 2012-12-29 NOTE — Progress Notes (Signed)
Aware of request for possible IR placed decompressive G-tube. Have discussed with GI and reviewed their notes/recs. Will await further input as patient is discussing treatment options with his family before making a decision.

## 2012-12-30 ENCOUNTER — Inpatient Hospital Stay (HOSPITAL_COMMUNITY): Payer: 59 | Admitting: Anesthesiology

## 2012-12-30 ENCOUNTER — Encounter (HOSPITAL_COMMUNITY): Payer: Self-pay | Admitting: Anesthesiology

## 2012-12-30 ENCOUNTER — Encounter (HOSPITAL_COMMUNITY)
Admission: AD | Disposition: A | Discharge status: Home / Self Care | Payer: Self-pay | Source: Ambulatory Visit | Attending: Internal Medicine

## 2012-12-30 ENCOUNTER — Inpatient Hospital Stay (HOSPITAL_COMMUNITY): Payer: 59

## 2012-12-30 ENCOUNTER — Encounter (HOSPITAL_COMMUNITY): Payer: Self-pay | Admitting: *Deleted

## 2012-12-30 DIAGNOSIS — C482 Malignant neoplasm of peritoneum, unspecified: Secondary | ICD-10-CM

## 2012-12-30 HISTORY — PX: DUODENAL STENT PLACEMENT: SHX5541

## 2012-12-30 HISTORY — PX: ENTEROSCOPY: SHX5533

## 2012-12-30 LAB — GLUCOSE, CAPILLARY
Glucose-Capillary: 120 mg/dL — ABNORMAL HIGH (ref 70–99)
Glucose-Capillary: 132 mg/dL — ABNORMAL HIGH (ref 70–99)
Glucose-Capillary: 133 mg/dL — ABNORMAL HIGH (ref 70–99)
Glucose-Capillary: 135 mg/dL — ABNORMAL HIGH (ref 70–99)

## 2012-12-30 LAB — PHOSPHORUS: Phosphorus: 4.4 mg/dL (ref 2.3–4.6)

## 2012-12-30 SURGERY — ENTEROSCOPY
Anesthesia: Monitor Anesthesia Care

## 2012-12-30 MED ORDER — PROPOFOL 10 MG/ML IV EMUL
INTRAVENOUS | Status: DC | PRN
  Administered 2012-12-30: 100 ug/kg/min via INTRAVENOUS

## 2012-12-30 MED ORDER — MIDAZOLAM HCL 5 MG/5ML IJ SOLN
INTRAMUSCULAR | Status: DC | PRN
  Administered 2012-12-30: 2 mg via INTRAVENOUS

## 2012-12-30 MED ORDER — SODIUM CHLORIDE 0.9 % IV SOLN
INTRAVENOUS | Status: DC | PRN
  Administered 2012-12-30: 10:00:00 via INTRAVENOUS

## 2012-12-30 MED ORDER — LACTATED RINGERS IV SOLN
INTRAVENOUS | Status: DC | PRN
  Administered 2012-12-30: 10:00:00 via INTRAVENOUS

## 2012-12-30 MED ORDER — LACTATED RINGERS IV SOLN
INTRAVENOUS | Status: DC
  Administered 2012-12-30: 1000 mL via INTRAVENOUS

## 2012-12-30 MED ORDER — FENTANYL CITRATE 0.05 MG/ML IJ SOLN
25.0000 ug | INTRAMUSCULAR | Status: DC | PRN
  Administered 2012-12-31 – 2013-01-02 (×3): 50 ug via INTRAVENOUS
  Filled 2012-12-30 (×3): qty 2

## 2012-12-30 MED ORDER — INSULIN REGULAR HUMAN 100 UNIT/ML IJ SOLN
INTRAVENOUS | Status: AC
  Administered 2012-12-30: 18:00:00 via INTRAVENOUS
  Administered 2012-12-30: 115 mL/h via INTRAVENOUS
  Filled 2012-12-30: qty 2760

## 2012-12-30 MED ORDER — FENTANYL CITRATE 0.05 MG/ML IJ SOLN
INTRAMUSCULAR | Status: DC | PRN
  Administered 2012-12-30 (×2): 25 ug via INTRAVENOUS
  Administered 2012-12-30: 12.5 ug via INTRAVENOUS
  Administered 2012-12-30 (×3): 25 ug via INTRAVENOUS
  Administered 2012-12-30: 12.5 ug via INTRAVENOUS
  Administered 2012-12-30 (×2): 25 ug via INTRAVENOUS

## 2012-12-30 NOTE — Progress Notes (Signed)
Enteroscopy shows marked progression of tumor infiltration at the level of the jejunal stent and beyond. He is obstructed beyond the stent. Despite herculeon Efforts, I was unable to pass a wire to the area of obstruction and hence could not place another stent. Unfortunately, there is nothing more to offer in the way of endoscopic procedures.

## 2012-12-30 NOTE — Progress Notes (Signed)
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 [name] in [facility] which has left him with multiple drains and fistulas. He also had recent cholecystitis. [facility]. 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 A duodenal stent was placed by GI during second attempt. He however continued to be obstructed. He was transferred to [facility] on 1/3 for evaluation and management. Since hospitalization, he has continued to have copious NG tube drainage. Surgeons and GI do not see any surgical/intervention options. Oncology does not recommend any further chemotherapy at this time. Patient in the interim developed septic shock from MRSA bacteremia/likely line sepsis. Palliative team consulted for goals of care-patient wishes to continue aggressive course.  Assessment/Plan: Principal Problem:  *Peritoneal carcinomatosis Active Problems:  ANEMIA  SBO (small bowel obstruction)  Hypokalemia  Nausea and vomiting in adult  Anemia of chronic disease  Colo-enteric fistula  Acute cholecystitis s/p perc draianage [id number]  Fistula of small intestine  Shock  Sepsis  Septic shock due to Staphylococcus aureus  Pancytopenia  Hypomagnesemia  1. Septic Shock secondary to MRSA bacteremia-? Line sepsis (has PICC line and Port-A-Cath): Less likely from Cholangitis.  - currently on vancomycin. Patient was transferred to ICU a couple of days ago for hypotension and tachycardia. - ID following, recommended removal of PICC line & Port-A-Cath, line holiday and prolonged IV antibiotics. Blood cultures were 2 x 2 positive for  MRSA. Repeat blood cultures 1/11 negative to date. Please see Dr. Luciana Axe, ID notes 1/14 for recommendations - patient wishes to wait until  feeding options are sorted out before deciding to remove central lines. He is aware of risk of endocarditis. 2. Pancytopenia: Leukopenia and thrombocytopenia have resolved. H/h stable.  3. Hypokalemia and hypomagnesemia: Pharmacy replacing potassium and magnesium. Improved. New hyponatremia-monitor closely. 4. Peritoneal carcinomatosis diagnosed in December 2012 s/p extensive debulking surgery at [facility] twice (1st time January 2013 second time 08/2012) . Patient has had multiple surgical procedures (refer to surgeon's notes). Palliative did goals of care and patient has chosen to proceed with medical treatment options as much as possible. 5. Small bowel obstruction/duodenal obstruction despite stent: Currently conservative management-n.p.o., and TNA. Failed NGT clamping trial after 5-1/2 hours on 1/11. Surgery has consulted IR for PEG placement. Failed attempt at stent revision on 1/14 - progression of tumor infiltration .  6. Cholecystitis status post cholecystostomy drain placed at [facility] - IR performed cholecystostomy tube change on 1/6.  Code Status: Full  Family Communication: Discussed with patient, spouse and sister at bedside. Disposition Plan: To be determined-not medically ready   Consultants:  Oncology  [facility]  IR  General surgery.  Critical care medicine.  Palliative care medicine  ID  Procedures:  NG tube  Change of cholecystostomy tube by IR on 1/6 Diagnostic small bowel enteroscopy 1/14  Antibiotics:  IV vancomycin 1/6 >  IV Zosyn 1/6 > 1/8   Subjective: Denies complaints. Feels dejected.  Objective: Weight change:   Intake/Output Summary (Last 24 hours) at 12/30/12 1927 Last data filed at 12/30/12 1612  Gross per 24 hour  Intake   2655 ml  Output   2300 ml  Net  355 ml   Blood pressure 110/80, pulse 90,  temperature 97.6 F (36.4 C), temperature source Oral, resp. rate 25, height 6\' 4"  (1.93 m), weight 92.534 kg (204 lb), SpO2 98.00%.  Physical Exam: General: Alert and awake, oriented x3, not in any acute distress.  CVS: S1-S2 clear, no murmur rubs or gallops Chest: clear to auscultation bilaterally, no wheezing, rales or rhonchi Abdomen: soft nontender, nondistended,RLQ ostomy. RUQ cholecystostomy drain. Upper midline Eakin's pouch. LLQ drain.  Neuro: Cranial nerves II-XII intact, no focal neurological deficits  Lab Results: Basic Metabolic Panel:  Lab 12/30/12 1610 12/29/12 0355 12/28/12 0320  NA -- 130* 133*  K -- 3.8 3.8  CL -- 95* 99  CO2 -- 25 25  GLUCOSE -- 120* 121*  BUN -- 27* 23  CREATININE -- 0.83 0.71  CALCIUM -- 10.4 9.9  MG -- 1.6 --  PHOS 4.4 -- --   Liver Function Tests:  Lab 12/29/12 0355 12/27/12 0430  AST 27 24  ALT 49 52  ALKPHOS 484* 519*  BILITOT 0.9 1.0  PROT 7.2 6.2  ALBUMIN 2.7* 2.4*  CBC:  Lab 12/29/12 0355 12/27/12 0430  WBC 9.4 6.5  NEUTROABS 5.8 --  HGB 9.4* 8.6*  HCT 29.1* 26.3*  MCV 85.8 84.6  PLT 258 205   CBG:  Lab 12/30/12 1713 12/30/12 1454 12/30/12 0524 12/30/12 0029 12/29/12 1656  GLUCAP 132* 140* 135* 120* 142*     Micro Results: Recent Results (from the past 240 hour(s))  CULTURE, BLOOD (ROUTINE X 2)     Status: Normal   Collection Time   12/22/12 12:55 PM      Component Value Range Status Comment   Specimen Description BLOOD LEFT HAND   Final    Special Requests BOTTLES DRAWN AEROBIC AND ANAEROBIC [facility]   Final    Culture  Setup Time 12/22/2012 22:48   Final    Culture     Final    Value: METHICILLIN RESISTANT STAPHYLOCOCCUS AUREUS     Note: RIFAMPIN AND GENTAMICIN SHOULD NOT BE USED AS SINGLE DRUGS FOR TREATMENT OF STAPH INFECTIONS. CRITICAL RESULT CALLED TO, READ BACK BY AND VERIFIED WITH: JENNIFER FUQUAY @ 1258 12/24/12 BY KRAWS     Note: Gram Stain Report Called to,Read Back By and Verified With: Burnell Blanks @ 1254  12/23/12 BY KRAWS   Report Status 12/25/2012 FINAL   Final    Organism ID, Bacteria METHICILLIN RESISTANT STAPHYLOCOCCUS AUREUS   Final   CULTURE, BLOOD (ROUTINE X 2)     Status: Normal   Collection Time   12/22/12  1:05 PM      Component Value Range Status Comment   Specimen Description BLOOD LEFT HAND   Final    Special Requests BOTTLES DRAWN AEROBIC AND ANAEROBIC [facility]   Final    Culture  Setup Time 12/22/2012 22:48   Final    Culture     Final    Value: STAPHYLOCOCCUS AUREUS     Note: SUSCEPTIBILITIES PERFORMED ON PREVIOUS CULTURE WITHIN THE LAST 5 DAYS.     Note: Gram Stain Report Called to,Read Back By and Verified With: KIM Richardson Dopp @ 1254 12/23/12 BY KRAWS   Report Status 12/25/2012 FINAL   Final   MRSA PCR SCREENING     Status: Abnormal   Collection Time   12/22/12  5:42 PM      Component Value Range Status Comment   MRSA by PCR POSITIVE (*) NEGATIVE Final   CULTURE, BLOOD (ROUTINE X 2)  Status: Normal (Preliminary result)   Collection Time   12/27/12  4:30 AM      Component Value Range Status Comment   Specimen Description BLOOD RIGHT PICC   Final    Special Requests BOTTLES DRAWN AEROBIC AND ANAEROBIC 5CC   Final    Culture  Setup Time 12/27/2012 13:21   Final    Culture     Final    Value:        BLOOD CULTURE RECEIVED NO GROWTH TO DATE CULTURE WILL BE HELD FOR 5 DAYS BEFORE ISSUING A FINAL NEGATIVE REPORT   Report Status PENDING   Incomplete     Studies/Results: Dg Abd 1 View - Kub  12/11/2012  *RADIOLOGY REPORT*  Clinical Data: Duodenal stricture.  Attempted endoscopy.  DG C-ARM GT 120 MIN,ABDOMEN - 1 VIEW  Technique: 20 minutes and 33 seconds of fluoroscopy was utilized.  Comparison:  Upper GI 12/09/2012.  Findings: Three images are submitted.  The biliary drain and abdominal drain are in place.  The endoscope remains in the stomach, all three images.  IMPRESSION:  1.  Intraoperative fluoroscopy for guidance of endoscopy.   Original Report Authenticated By: Marin Roberts,  M.D.    Ct Abdomen Pelvis W Contrast  12/16/2012  *RADIOLOGY REPORT*  Clinical Data: Small bowel obstruction  CT ABDOMEN AND PELVIS WITH CONTRAST  Technique:  Multidetector CT imaging of the abdomen and pelvis was performed following the standard protocol during bolus administration of intravenous contrast.  Contrast: 80mL OMNIPAQUE IOHEXOL 300 MG/ML  SOLN  Comparison: Abdominal x-ray is of multiple recent base.  Findings: Images which include the lower chest shows small bilateral pleural effusions with bibasilar collapse / consolidation.  NG tube tip is noted in the mid stomach.  There appears to be some wall thickening in the distal stomach, but this may be related to the underdistended state.  Duodenum is opacified.  There is a stent in the proximal jejunum.  Contrast material enters the proximal end of the stent and a very thin string of contrast can be seen to track through the stent lumen in the small bowel beyond the stent.  There is soft tissue or debris filling the stent lumen creating only is a very thin channel for flow of contrast through the stent.  Small bowel loops distal to the stent are decompressed.  There is a small amount of free fluid in the abdomen. Bowel anatomy is difficult to discern given the fairly substantial amount of adhesions and scarring in this patient with peritoneal carcinomatosis.  There is a right lower quadrant stoma, presumably ileostomy.  The patient appears to be status post omentectomy and probably right hemicolectomy.  A small bowel anastomoses is seen in the right abdomen.  No focal abnormalities seen in the liver or spleen.  Pancreas is unremarkable.  The adrenal glands are normal.  No evidence for hydronephrosis.  Probable cyst noted in the right kidney.  No abdominal aortic aneurysm.  Pigtail catheter is seen coiled in the anterior midline in or just deep to the rectus sheath.  A second to pigtail catheter in the right upper quadrant adjacent to the gallbladder  fundus is consistent with a reported history of cholecystostomy.  The gallbladder is decompressed.  Surgical drain is positioned in the left paracolic gutter.  Imaging through the pelvis shows enlarged prostate gland.  The bladder is decompressed.  Left colon is decompressed.  Bone windows reveal no worrisome lytic or sclerotic osseous lesions.  IMPRESSION: The proximal jejunal  stent device is patent although only of very thin string of contrast material passes through the stent into the decompressed small bowel distal to the stent. Even with the stent in place, this segment of bowel appears to represent a region of luminal stenosis.  The duodenum proximal to the stent is distended.  Distal stomach appears to have abnormal circumferential wall thickening with some luminal compromise, but no outlet obstruction at this time.   Original Report Authenticated By: Kennith Center, M.D.    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  The patient is also status post prior percutaneous cholecystostomy tube placement for acalculous cholecystitis.  Assessment of the cholecystostomy tube is performed.  1.  CHOLANGIOGRAM THROUGH PREEXISTING CATHETER 2.  PERCUTANEOUS CHOLECYSTOSTOMY TUBE EXCHANGE  Contrast:  50 ml Omnipaque-300  Fluoroscopy Time: 7.5 minutes.  Procedure:  The procedure, risks, benefits, and alternatives were explained to the patient.  Questions regarding the procedure were encouraged and answered.  The patient understands and consents to the procedure.  The preexisting percutaneous cholecystostomy tube was prepped with Betadine in a sterile fashion, and a sterile drape was applied covering the operative field.  A sterile gown and sterile gloves were used for the procedure.  The preexisting catheter was injected with contrast material under fluoroscopy.  It was then removed over a guidewire.   A 5-French catheter was then introduced over a guidewire and advanced into the lumen of the gallbladder.  Additional contrast injection was performed.  The catheter was further advanced through the cystic duct and into the common bile duct and additional cholangiogram performed through the catheter.  The catheter was then retracted into the lumen of the gallbladder.  A new 10-French multipurpose drainage catheter was advanced over a wire and formed in the gallbladder lumen.  Final catheter position was confirmed with a fluoroscopic spot image obtained after injection of contrast.  The catheter was secured at the skin with a Prolene retention suture and Stat-Lock device.  The catheter was connected to a gravity drainage bag.  Complications:  None  Findings:  Injection of a preexisting drain shows the catheter to lie just outside the lumen of the gallbladder.  Contrast injection does partially fill the gallbladder lumen and demonstrates a significant amount of internal debris and sludge within the gallbladder.  After removal of the preexisting catheter, a percutaneous tract to the gallbladder lumen was able to be catheterized.  Cholangiogram shows very slow and sluggish flow of contrast via the cystic duct into the common bile duct.  A significant amount of debris is present extending into the cystic duct and also within the distal aspect of the common bile duct.  With a catheter further advanced into the CBD, contrast injection does show a patent common bile duct with flow of contrast noted via the ampulla into the duodenum.  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  There is a fistula demonstrated to adjacent small bowel with eventual opacification of several right sided small bowel loops demonstrating poor motility.  An additional fistula extends medially into a space adjacent to the descending duodenum.  Given demonstration of fistulas as well  as poor flow of contrast from the gallbladder into the common bile duct, a new cholecystostomy tube was placed and formed at the level of the gallbladder lumen.  This will be left to  gravity drainage.  IMPRESSION: The preexisting catheter lies just outside the lumen of the gallbladder.  After recanalizing a tract to the gallbladder lumen, contrast injection demonstrates significant debris in the lumen of the gallbladder and poor flow of contrast into the common bile duct.  Debris was also present in the distal aspect of the CBD. The study also demonstrated complex fistulas communicating with the gallbladder lumen and opacifying both small bowel as well as a tract to a cavity lying adjacent to the descending duodenum.  A new 10-French catheter was formed in the gallbladder and will be left to gravity drainage.   Original Report Authenticated By: Irish Lack, M.D.    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  The patient is also status post prior percutaneous cholecystostomy tube placement for acalculous cholecystitis.  Assessment of the cholecystostomy tube is performed.  1.  CHOLANGIOGRAM THROUGH PREEXISTING CATHETER 2.  PERCUTANEOUS CHOLECYSTOSTOMY TUBE EXCHANGE  Contrast:  50 ml Omnipaque-300  Fluoroscopy Time: 7.5 minutes.  Procedure:  The procedure, risks, benefits, and alternatives were explained to the patient.  Questions regarding the procedure were encouraged and answered.  The patient understands and consents to the procedure.  The preexisting percutaneous cholecystostomy tube was prepped with Betadine in a sterile fashion, and a sterile drape was applied covering the operative field.  A sterile gown and sterile gloves were used for the procedure.  The preexisting catheter was injected with contrast material under fluoroscopy.  It was then removed over a guidewire.  A  5-French catheter was then introduced over a guidewire and advanced into the lumen of the gallbladder.  Additional contrast injection was performed.  The catheter was further advanced through the cystic duct and into the common bile duct and additional cholangiogram performed through the catheter.  The catheter was then retracted into the lumen of the gallbladder.  A new 10-French multipurpose drainage catheter was advanced over a wire and formed in the gallbladder lumen.  Final catheter position was confirmed with a fluoroscopic spot image obtained after injection of contrast.  The catheter was secured at the skin with a Prolene retention suture and Stat-Lock device.  The catheter was connected to a gravity drainage bag.  Complications:  None  Findings:  Injection of a preexisting drain shows the catheter to lie just outside the lumen of the gallbladder.  Contrast injection does partially fill the gallbladder lumen and demonstrates a significant amount of internal debris and sludge within the gallbladder.  After removal of the preexisting catheter, a percutaneous tract to the gallbladder lumen was able to be catheterized.  Cholangiogram shows very slow and sluggish flow of contrast via the cystic duct into the common bile duct.  A significant amount of debris is present extending into the cystic duct and also within the distal aspect of the common bile duct.  With a catheter further advanced into the CBD, contrast injection does show a patent common bile duct with flow of contrast noted via the ampulla into the duodenum.  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  There is a fistula demonstrated to adjacent small bowel with eventual opacification of several right sided small bowel loops demonstrating poor motility.  An additional fistula extends medially into a space adjacent to the descending duodenum.  Given demonstration of fistulas as well as  poor flow of  contrast from the gallbladder into the common bile duct, a new cholecystostomy tube was placed and formed at the level of the gallbladder lumen.  This will be left to gravity drainage.  IMPRESSION: The preexisting catheter lies just outside the lumen of the gallbladder.  After recanalizing a tract to the gallbladder lumen, contrast injection demonstrates significant debris in the lumen of the gallbladder and poor flow of contrast into the common bile duct.  Debris was also present in the distal aspect of the CBD. The study also demonstrated complex fistulas communicating with the gallbladder lumen and opacifying both small bowel as well as a tract to a cavity lying adjacent to the descending duodenum.  A new 10-French catheter was formed in the gallbladder and will be left to gravity drainage.   Original Report Authenticated By: Irish Lack, M.D.    Dg Sinus/fist Tube Chk-non Gi  12/22/2012  *RADIOLOGY REPORT*  Clinical Data:Evaluate for fistula  ABSCESS INJECTION  Fluoroscopy Time: 1.09 minutes  Comparison: CT of the abdomen and pelvis dated 12/16/2012  Findings: Scout radiograph was obtained.  This shows a nasogastric tube within the stomach.  There is a metallic stent identified within the proximal jejunum.  JP drain overlies the left lower quadrant of the abdomen there is a percutaneous cholecystostomy tube within the right abdomen.  Left lower quadrant the JP drain was accessed and 40 ml of omni 300 was injected into the drain.  No significant extravasation of contrast material from the draining identified to suggest residual abscess or fistula formation.  Mild intra vascular extravasation with opacification of the left lower quadrant venous structures noted.  IMPRESSION: 1.  No evidence for residual abscess or fistula formation surrounding the left lower quadrant JP drain.  Findings were discussed with Dr. Michaell Cowing at the time the procedure was performed on 12/22/2012.   Original Report  Authenticated By: Signa Kell, M.D.    Dg Chest Port 1 View  12/22/2012  *RADIOLOGY REPORT*  Clinical Data: Chills.  Shortness of breath.  PORTABLE CHEST - 1 VIEW  Comparison: 11/27/2012  Findings: Left Port-A-Cath and right PICC line remain in place, unchanged.  NG tube has been placed into the stomach.  Lungs are clear.  No effusions.  Heart is normal size.  IMPRESSION: No acute cardiopulmonary disease.   Original Report Authenticated By: Charlett Nose, M.D.    Dg Chest Portable 1 View  11/27/2012  *RADIOLOGY REPORT*  Clinical Data: Port-A-Cath and PICC line.  PORTABLE CHEST - 1 VIEW  Comparison: 10/26/2012  Findings: Shallow inspiration.  Mild cardiac enlargement with normal pulmonary vascularity.  There is increased opacity in the left base suggesting left pleural effusion with basilar atelectasis or infiltration.  Tortuous aorta.  Left subclavian Infuse-A-Port remains in place with tip over the mid SVC region.  A right PICC line remains in place with tip in the SVC / RA junction. No pneumothorax.  No significant change since previous study.  IMPRESSION: Probable left pleural effusion and basilar atelectasis or infiltration.  PICC line and central venous catheter remain unchanged in position.   Original Report Authenticated By: Burman Nieves, M.D.    Dg Ercp  12/13/2012  *RADIOLOGY REPORT*  Clinical Data: Stenotic small bowel segment just distal to the ligament of Treitz.  ERCP  Comparison:  Small bowel series from 12/09/2012  Technique:  Multiple spot images obtained with the fluoroscopic device and submitted for interpretation post-procedure.  ERCP was performed by Dr.  Arlyce Dice.  Findings: Four spot fluoro images are submitted.  I discussed this study by telephone with Dr. Arlyce Dice immediately after the procedure was completed.  The first image demonstrates a small bowel stent over the left abdomen.  The second image shows injection of contrast material which opacifies the dilated loop just proximal  to the intraluminal stent. The stent is opacified throughout its entire length and there does appear to be contrast draining from the distal end of the stent into nondilated small bowel.  The third image again shows contrast in the dilated loop proximal to the stent with opacification of the stent lumen down to the distal end of the stent.  The fourth image shows contrast material in the dilated small bowel loop just proximal to the stent.  There is contrast within the stent device with a small amount of contrast visible in the small bowel just distal to the stent.  Comparing this see image to image 16 of the previous small bowel series, the orientation of the bowel is very similar and confirms that the stent device is in the stenotic segment.  There is also some intraluminal contrast visible within small bowel superimposed on the distal stent which I think is probably some retrograde reflux of contrast back up along the stent, between the stent device and the bowel mucosa.  IMPRESSION: Endoscopic small bowel stent placement just distal to the ligament of Treitz.  These images are consistent with flow of contrast material from the dilated proximal small bowel through the stent into the decompressed small bowel distal to the stent.  These images were submitted for radiologic interpretation only. Please see the procedural report for the amount of contrast and the fluoroscopy time utilized.   Original Report Authenticated By: Kennith Center, M.D.    Dg Abd Portable 1v  12/25/2012  *RADIOLOGY REPORT*  Clinical Data: NG tube placement  PORTABLE ABDOMEN - 1 VIEW  Comparison: 12/15/2012  Findings: Enteric stent remains in place.  Surgical drain along the left side of abdomen.  Cholecystostomy tube projects over the gallbladder lumen.  NG tube placed with its tip in the fundus of the stomach.  No obvious free intraperitoneal gas.  No disproportionate dilatation of bowel.  IMPRESSION: NG tube tip is in the fundus of the  stomach.   Original Report Authenticated By: Jolaine Click, M.D.    Dg Abd Portable 1v  12/15/2012  *RADIOLOGY REPORT*  Clinical Data: NG tube placed  PORTABLE ABDOMEN - 1 VIEW  Comparison: 2:40 hours  Findings: NG tube has been placed with its tip in the fundus of the stomach.  The amount of contrast in the stomach has diminished.  No disproportionate dilatation of bowel.  Bowel stent is stable in the left side of the abdomen.  IMPRESSION: NG tube placed into the stomach.  Diminished contrast in the stomach.   Original Report Authenticated By: Jolaine Click, M.D.    Dg Abd Portable 1v  12/15/2012  *RADIOLOGY REPORT*  Clinical Data: Check contrast progression through the small bowel.  PORTABLE ABDOMEN - 1 VIEW  Comparison: Abdominal radiograph performed 12/14/2012  Findings: Contrast is again seen filling the stomach, and is now better seen filling the duodenum.  There is distension of the fourth segment of the duodenum to 7.2 cm.  This is highly suspicious for occlusion at the level of the jejunal stent.  No contrast is seen progressing through the jejunal stent.  The visualized bowel gas pattern is grossly unremarkable, though there is a relative paucity of bowel gas within the abdomen.  No definite  free abdominal air is identified, though evaluation for free air is suboptimal on a single supine view.  The right-sided cholecystostomy tube is grossly unremarkable in appearance.  Left-sided drains are grossly stable, though the position of the more superior drain has shifted mildly.  No acute osseous abnormalities are identified.  IMPRESSION: Contrast again seen filling the stomach, and better seen filling the duodenum.  Distension of the fourth segment of the duodenum to 7.2 cm.  This is highly suspicious for occlusion at the level of the jejunal stent.  No evidence of contrast progression beyond the jejunal stent.   Original Report Authenticated By: Tonia Ghent, M.D.    Dg Abd Portable 1v  12/14/2012   *RADIOLOGY REPORT*  Clinical Data: Small bowel obstruction; assess for patency of jejunal stent.  PORTABLE ABDOMEN - 1 VIEW  Comparison: Abdominal radiograph performed earlier today at 09:06 p.m.  Findings: Contrast is noted filling the stomach; previously noted contrast within the duodenum is no longer well seen and may have become more dilute, or may have refluxed back into the stomach.  No contrast is seen within the jejunal stent or more distally.  The visualized bowel gas pattern is grossly unremarkable. A right- sided cholecystostomy tube is again noted; left-sided drains are seen.  No acute osseous abnormalities are seen.  Retrocardiac airspace opacification is again noted.  IMPRESSION:  1.  Contrast noted filling the stomach; previously noted contrast within the duodenum is no longer well seen and may have become more dilute, or may have refluxed back into the stomach.  No contrast seen within the jejunal stent or more distally.  To assess for obstruction at the jejunal stent, a formal upper GI series and small bowel follow-through should be performed, when clinically appropriate.  In the meantime, a follow-up film may be considered in 3-4 hours.  2.  Persistent retrocardiac airspace opacification.   Original Report Authenticated By: Tonia Ghent, M.D.    Dg Abd Portable 1v  12/14/2012  *RADIOLOGY REPORT*  Clinical Data: Small bowel follow-through; follow-up image at 3 hours 10 minutes.  PORTABLE ABDOMEN - 1 VIEW  Comparison: Abdominal radiograph performed earlier today at 06:24 p.m.  Findings: Ingested contrast remains mostly within the stomach, with a small amount of contrast seen extending through the duodenum to the level of the jejunal stent.  No definite contrast is seen extending through the majority of the jejunal stent.  The visualized bowel gas pattern is grossly unremarkable, though there is a relative paucity of bowel gas within the abdomen.  A right-sided cholecystostomy tube is grossly  unchanged in appearance.  No definite free intra-abdominal air is identified, though evaluation for free air is suboptimal on supine views.  No acute osseous abnormalities are identified.  Persistent retrocardiac airspace opacification is noted.  IMPRESSION:  1.  Ingested contrast remains mostly within the stomach, with a small amount of contrast seen extending through the duodenum to the level of the jejunal stent.  No definite contrast seen extending through the majority of the jejunal stent.  A follow-up image is planned in 2 hours.  2.  Persistent retrocardiac airspace opacification seen.   Original Report Authenticated By: Tonia Ghent, M.D.    Dg Abd Portable 1v  12/13/2012  *RADIOLOGY REPORT*  Clinical Data: Status post proximal jejunal stent placement for stenotic segment.  PORTABLE ABDOMEN - 1 VIEW  Comparison: ERCP images from earlier the same day  Findings: The contrast material which was present in the dilated small bowel just proximal to  the stent is not readily evident on this film.  There is some contrast posterior wall in the fundus of the stomach. Unfortunately, it is not possible to know whether the contrast seen in the dilated small bowel proximal to the stent on the ERCP has passed through the stent device or refluxed back up into the stomach.  IMPRESSION: No contrast material visible within dilated small bowel proximal to the stent, but since no contrast can be definitely visualized distal to the stent it is unclear whether the contrast refluxed back into the stomach or has migrated distally through the stent and become diluted out in the more distal small bowel loops.   Original Report Authenticated By: Kennith Center, M.D.    Dg Abd Portable 1v  12/09/2012  *RADIOLOGY REPORT*  Clinical Data: Nasogastric tube placement.  PORTABLE ABDOMEN - 1 VIEW  Comparison: Abdominal radiograph performed 12/07/2012  Findings: The patient's enteric tube is seen ending overlying the body of the stomach.   The visualized bowel gas pattern is grossly unremarkable.  An apparent ostomy site is noted at the right lower quadrant.  A small right-sided IJ catheter is unchanged in position.  Left-sided drains are also grossly stable.  No acute osseous abnormalities are identified.  Mild sclerotic change is seen at the sacroiliac joints.  IMPRESSION: Enteric tube noted ending overlying the body of the stomach.   Original Report Authenticated By: Tonia Ghent, M.D.    Dg Abd Portable 1v  12/07/2012  *RADIOLOGY REPORT*  Clinical Data: For NG tube placement  PORTABLE ABDOMEN - 1 VIEW  Comparison: Abdomen film of 03/26/2012  Findings: The feeding tube enters the stomach and coils with the tip in the fundus of the stomach.  There is abnormal opacity at the left lung base suspicious for atelectasis and effusion.  Pneumonia cannot be excluded.  IMPRESSION:  1.  Feeding tube enters the stomach with the tip coiled within the fundus. 2.  Opacity at the left lung base.  Consider left basilar atelectasis, effusion, and possibly pneumonia.   Original Report Authenticated By: Dwyane Dee, M.D.    Varney Biles Kayleen Memos [place]  12/14/2012  *RADIOLOGY REPORT*  Clinical Data:  Nausea and vomiting after small bowel wall stent placement.  UPPER GI SERIES WITH KUB  Technique:  Routine upper GI series was performed with thin barium.  Fluoroscopy Time: Zero minutes  Comparison:  None.  Findings: Scout view of the abdomen shows and metallic wall stent in the left abdomen.  Surgical drain is also seen in the left abdomen. Postoperative changes and a pigtail drain are seen in the right abdomen.  Relative paucity of gas overall.  An extremely limited examination was performed at the request of the referring physician.  The patient drank one couple of barium. A single AP supine image shows contrast in the stomach.  IMPRESSION: Contrast remains in the stomach after ingestion of one cup of barium.  The patient will drink another half cup of barium and will be  reimaged in 2 hours to assess patency of the small bowel stent.   Original Report Authenticated By: Leanna Battles, M.D.    Dg Kayleen Memos W/small Bowel  12/09/2012  *RADIOLOGY REPORT*  Clinical Data:Persistent vomiting.  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 Thin barium also used.  Subsequently, serial images of the small bowel were obtained including spot views of the  terminal ileum.  Fluoroscopy Time: 4.2-minute  Comparison: CT abdomen 11/27/2012  Findings: Thin barium was injected through the indwelling NG tube into  the stomach.  The stomach is mildly dilated.  The duodenum is markedly dilated.  There is a transition point at the ligament of Treitz with a stricture present at the ligament of Treitz.  There is very little contrast in the proximal jejunum.  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  Delayed images were obtained for 1 hour 15 minutes showing only a small amount of barium in the proximal jejunum.  IMPRESSION: High-grade obstruction at the ligament of Treitz with dilatation of the duodenum.  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  I discussed the findings with Willette Cluster, PA [place] [facility]   Original Report Authenticated By: Janeece Riggers, M.D.    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  UPPER GI SERIES WITH KUB  Technique:  Routine upper GI series was performed with Omnipaque- 300  Fluoroscopy Time: 1.47 minutes  Comparison:  CT scan 12/16/2012  Findings: Contrast was infused via the NG tube in the stomach. Stomach is unremarkable.  Very little peristaltic action was demonstrated but contrast did enter into the duodenum with the patient on  the right side and elevated.  Occasional GE reflux was noted.  There was to and fro movement of contrast in the second portion of the duodenum which was mildly dilated.  Very little contrast entered the duodenal stent but a small amount did pass through it.  Very little contrast was seen beyond the stent.  IMPRESSION:  1.  Mild dilatation of the duodenum with very little contrast seen entering the duodenal stent. 2.  No dilated distal small bowel to suggest obstruction.   Original Report Authenticated By: Rudie Meyer, M.D.    Dg C-arm Gt 120 Min  12/11/2012  *RADIOLOGY REPORT*  Clinical Data: Duodenal stricture.  Attempted endoscopy.  DG C-ARM GT 120 MIN,ABDOMEN - 1 VIEW  Technique: 20 minutes and 33 seconds of fluoroscopy was utilized.  Comparison:  Upper GI 12/09/2012.  Findings: Three images are submitted.  The biliary drain and abdominal drain are in place.  The endoscope remains in the stomach, all three images.  IMPRESSION:  1.  Intraoperative fluoroscopy for guidance of endoscopy.   Original Report Authenticated By: Marin Roberts, M.D.     Medications: Scheduled Meds:    . antiseptic oral rinse  15 mL Mouth Rinse BID  . enoxaparin (LOVENOX) injection  40 mg Subcutaneous Q24H  . insulin aspart  0-9 Units Subcutaneous Q6H  . lip balm  1 application Topical BID  . metoCLOPramide  10 mg Intravenous QID  . pantoprazole  40 mg Intravenous Q12H  . sodium chloride  10-40 mL Intracatheter Q12H  . vancomycin  1,000 mg Intravenous Q12H      LOS: 11 days   [facility] M.D. [facility] 12/30/2012, 7:27 PM Pager: [phone] If 7PM-7AM, please contact night-coverage [email or web address] Password [email or web address]

## 2012-12-30 NOTE — Progress Notes (Deleted)
Patient states that he lacks an appetite and does not want to eat breakfast this morning.  Reviewed the menu options and he states not interested in breakfast, only wants a coke to drink.  He verbalized he just wants to rest for now.

## 2012-12-30 NOTE — Anesthesia Preprocedure Evaluation (Addendum)
Anesthesia Evaluation  Patient identified by MRN, date of birth, ID band Patient awake    Reviewed: Allergy & Precautions, H&P , NPO status , Patient's Chart, lab work & pertinent test results, reviewed documented beta blocker date and time   Airway Mallampati: II TM Distance: >3 FB Neck ROM: full    Dental  (+) Missing and Dental Advisory Given,    Pulmonary neg pulmonary ROS,  breath sounds clear to auscultation  Pulmonary exam normal       Cardiovascular Exercise Tolerance: Good hypertension, Pt. on home beta blockers and Pt. on medications Rhythm:regular Rate:Normal     Neuro/Psych negative neurological ROS  negative psych ROS   GI/Hepatic negative GI ROS, Neg liver ROS, GERD-  Medicated and Controlled,  Endo/Other  negative endocrine ROS  Renal/GU negative Renal ROS  negative genitourinary   Musculoskeletal   Abdominal   Peds  Hematology  (+) Blood dyscrasia, anemia , Pancytopenia.  Hgb. 9.4   Anesthesia Other Findings Metastatic peritoneal carcinoma  Reproductive/Obstetrics negative OB ROS                          Anesthesia Physical Anesthesia Plan  ASA: III  Anesthesia Plan: MAC   Post-op Pain Management:    Induction:   Airway Management Planned: Simple Face Mask  Additional Equipment:   Intra-op Plan:   Post-operative Plan:   Informed Consent: I have reviewed the patients History and Physical, chart, labs and discussed the procedure including the risks, benefits and alternatives for the proposed anesthesia with the patient or authorized representative who has indicated his/her understanding and acceptance.   Dental Advisory Given  Plan Discussed with: CRNA and Surgeon  Anesthesia Plan Comments:         Anesthesia Quick Evaluation

## 2012-12-30 NOTE — Progress Notes (Signed)
Report called to 3 east.

## 2012-12-30 NOTE — Transfer of Care (Signed)
Immediate Anesthesia Transfer of Care Note  Patient: [name]  Procedure(s) Performed: Procedure(s) (LRB): ENTEROSCOPY (N/A) DUODENAL STENT PLACEMENT (N/A)  Patient Location: PACU  Anesthesia Type: MAC  Level of Consciousness: sedated, patient cooperative and responds to stimulaton  Airway & Oxygen Therapy: Patient Spontanous Breathing and Patient connected to face mask oxgen  Post-op Assessment: Report given to PACU RN and Post -op Vital signs reviewed and stable  Post vital signs: Reviewed and stable  Complications: No apparent anesthesia complications

## 2012-12-30 NOTE — Consult Note (Signed)
[facility]     Reason for Consult: MRSA bacteremia    Referring Physician: Dr. Bennie Pierini  Principal Problem:  *Peritoneal carcinomatosis Active Problems:  ANEMIA  SBO (small bowel obstruction)  Hypokalemia  Nausea and vomiting in adult  Anemia of chronic disease  Colo-enteric fistula  Acute cholecystitis s/p perc draianage [id number]  Fistula of small intestine  Shock  Sepsis  Septic shock due to Staphylococcus aureus  Pancytopenia  Hypomagnesemia      . antiseptic oral rinse  15 mL Mouth Rinse BID  . enoxaparin (LOVENOX) injection  40 mg Subcutaneous Q24H  . insulin aspart  0-9 Units Subcutaneous Q6H  . lip balm  1 application Topical BID  . metoCLOPramide  10 mg Intravenous QID  . pantoprazole  40 mg Intravenous Q12H  . sodium chloride  10-40 mL Intracatheter Q12H  . vancomycin  1,000 mg Intravenous Q12H    Recommendations: Further discussion with primary team Pull lines, continue vancomycin for 6 weeks following line removal, repeat blood cultures x 2 after pulling lines.   Replace picc if blood culture negative at 48 hours -above is IF/WHEN patient is agreeable  Assessment: He has MRSA and chonically ill.  Ideally, he would have lines removed and get echo, however his situation is obviously unique.  I think a reasonable approach would be to remove lines and put a picc in 48 hours later if blood cultures negative.  He seems agreeable to this but would like to discuss further with Dr. [name] before deciding "when".     Antibiotics: vancomycin  HPI: [name] is a 57 y.o. male with peritoneal carcinomatosis s/p chemotherapy who was in [facility] and transferred due to SBO.  His course has been notable 2/2 blood cultures positive for MRSA.  He has a picc and port-a-cath and despite blood cultures, he has not wanted to have his lines removed up to this point.  He was hopeful to have a feeding tube placed so he could stop TPN and  have his lines removed, but unfortunately, GI was unable to place it due to obstruction.  He therefore remains TPN dependent.  He is amenable to having lines removed at some point, but has not yet decided when.  He has had no fever or chills.     Review of Systems: Pertinent items are noted in HPI.  Past Medical History  Diagnosis Date  . Hypertension   . GERD (gastroesophageal reflux disease)   . Anemia   . Small bowel obstruction 2013  . 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 Acute cholecystitis s/p perc draianage [id number] 12/20/2012    History  Substance Use Topics  . Smoking status: Never Smoker   . Smokeless tobacco: Never Used  . Alcohol Use: No     Comment: Stopped in July    History reviewed. No pertinent family history. Allergies  Allergen Reactions  . Percocet (Oxycodone-Acetaminophen)     hallucination    OBJECTIVE: Blood pressure 110/80, pulse 90, temperature 97.6 F (36.4 C), temperature source Oral, resp. rate 25, height 6\' 4"  (1.93 m), weight 204 lb (92.534 kg), SpO2 98.00%. General: Awake, chronically ill appearing, pleasant Skin: no rashes Lungs: CTA B   Microbiology: Recent Results (from the past 240 hour(s))  CULTURE, BLOOD (ROUTINE X 2)     Status: Normal   Collection Time   12/22/12 12:55 PM  Component Value Range Status Comment   Specimen Description BLOOD LEFT HAND   Final    Special Requests BOTTLES DRAWN AEROBIC AND ANAEROBIC [facility]   Final    Culture  Setup Time 12/22/2012 22:48   Final    Culture     Final    Value: METHICILLIN RESISTANT STAPHYLOCOCCUS AUREUS     Note: RIFAMPIN AND GENTAMICIN SHOULD NOT BE USED AS SINGLE DRUGS FOR TREATMENT OF STAPH INFECTIONS. CRITICAL RESULT CALLED TO, READ BACK BY AND VERIFIED WITH: JENNIFER FUQUAY @ 1258 12/24/12 BY KRAWS     Note: Gram Stain Report Called to,Read Back By and Verified With: Burnell Blanks @ 1254 12/23/12 BY KRAWS   Report  Status 12/25/2012 FINAL   Final    Organism ID, Bacteria METHICILLIN RESISTANT STAPHYLOCOCCUS AUREUS   Final   CULTURE, BLOOD (ROUTINE X 2)     Status: Normal   Collection Time   12/22/12  1:05 PM      Component Value Range Status Comment   Specimen Description BLOOD LEFT HAND   Final    Special Requests BOTTLES DRAWN AEROBIC AND ANAEROBIC [facility]   Final    Culture  Setup Time 12/22/2012 22:48   Final    Culture     Final    Value: STAPHYLOCOCCUS AUREUS     Note: SUSCEPTIBILITIES PERFORMED ON PREVIOUS CULTURE WITHIN THE LAST 5 DAYS.     Note: Gram Stain Report Called to,Read Back By and Verified With: KIM Richardson Dopp @ 1254 12/23/12 BY KRAWS   Report Status 12/25/2012 FINAL   Final   MRSA PCR SCREENING     Status: Abnormal   Collection Time   12/22/12  5:42 PM      Component Value Range Status Comment   MRSA by PCR POSITIVE (*) NEGATIVE Final   CULTURE, BLOOD (ROUTINE X 2)     Status: Normal (Preliminary result)   Collection Time   12/27/12  4:30 AM      Component Value Range Status Comment   Specimen Description BLOOD RIGHT PICC   Final    Special Requests BOTTLES DRAWN AEROBIC AND ANAEROBIC 5CC   Final    Culture  Setup Time 12/27/2012 13:21   Final    Culture     Final    Value:        BLOOD CULTURE RECEIVED NO GROWTH TO DATE CULTURE WILL BE HELD FOR 5 DAYS BEFORE ISSUING A FINAL NEGATIVE REPORT   Report Status PENDING   Incomplete     Staci Righter, MD [facility] [phone] pager  [phone] cell 12/30/2012, 4:25 PM

## 2012-12-30 NOTE — Anesthesia Postprocedure Evaluation (Signed)
  Anesthesia Post-op Note  Patient: [name]  Procedure(s) Performed: Procedure(s) (LRB): ENTEROSCOPY (N/A) DUODENAL STENT PLACEMENT (N/A)  Patient Location: PACU  Anesthesia Type: MAC  Level of Consciousness: awake and alert   Airway and Oxygen Therapy: Patient Spontanous Breathing  Post-op Pain: mild  Post-op Assessment: Post-op Vital signs reviewed, Patient's Cardiovascular Status Stable, Respiratory Function Stable, Patent Airway and No signs of Nausea or vomiting  Last Vitals:  Filed Vitals:   12/30/12 1423  BP: 113/81  Pulse:   Temp: 36.2 C  Resp:     Post-op Vital Signs: stable   Complications: No apparent anesthesia complications

## 2012-12-30 NOTE — Progress Notes (Signed)
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 Percocet (Oxycodone-Acetaminophen)     hallucination   Patient Measurements: Height: 6\' 4"  (193 cm) Weight: 204 lb (92.534 kg) IBW/kg (Calculated) : 86.8   Vital Signs: Temp: 97.7 F (36.5 C) (01/14 0845) Temp src: Oral (01/14 0852) BP: 101/73 mmHg (01/14 0852) Pulse Rate: 94  (01/14 0852) Intake/Output from previous day: 01/13 0701 - 01/14 0700 In: 5210.1 [I.V.:726; IV Piggyback:400; TPN:4084.1] Out: 2730 [Urine:350; Emesis/NG output:1950; Drains:230; Stool:200]  Labs:  [facility] 12/29/12 0355  WBC 9.4  HGB 9.4*  HCT 29.1*  PLT 258  APTT --  INR --     Basename 12/30/12 0505 12/29/12 0355 12/28/12 0320  NA -- 130* 133*  K -- 3.8 3.8  CL -- 95* 99  CO2 -- 25 25  GLUCOSE -- 120* 121*  BUN -- 27* 23  CREATININE -- 0.83 0.71  LABCREA -- -- --  CREAT24HRUR -- -- --  CALCIUM -- 10.4 9.9  MG -- 1.6 1.5  PHOS 4.4 5.3* 4.8*  PROT -- 7.2 --  ALBUMIN -- 2.7* --  AST -- 27 --  ALT -- 49 --  ALKPHOS -- 484* --  BILITOT -- 0.9 --  BILIDIR -- -- --  IBILI -- -- --  PREALBUMIN -- 29.0 --  TRIG -- 110 --  CHOLHDL -- -- --  CHOL -- 146 --   Estimated Creatinine Clearance: 122 ml/min (by C-G formula based on Cr of 0.83).    Insulin Requirements in the past 24 hours:  CBG < 150, required 4 units SSI, 11 units/L in TPN.  Per [facility] med list, patient was on SSI and regular insulin in TPN (15 units/L).    Nutritional Goals:   RD recs (1/8):  Kcal: 2600-2800, Protein: 130-140 gm, Fluid: >2.6L/day.    TPN @  [facility] = 2624 Kcals, 140g Protein, fluid (125 ml/hr over 24 hrs)  Goal rate inpatient: Clinimix E5/20 at 148ml/hr + Lipids MWF to provide Protein 138gm and 2909 kcal MWF, 2429 Kcal TTSS for weekly avg of 2634 kcal.  Current Nutrition:   NPO (as of 12/20/12)  Clinimix 5/15(without  electrolytes) at 167ml/hr.  mIVF = NS @ KVO  Assessment: 43 YOM with complex history including terminal peritoneal carcinomatosis s/p chemo.  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  Pt with LLQ drain, LUQ drain, ileostomy, choly drain, NG tube.  Pt transferred to [facility] on 12/[facility]11 from [facility] for SBO management, N/V, high NG output.  No further plans for chemotherapy or surgery at this time.  Pt developed hypotension and tachycardia and transferred to ICU on 1/6 with MRSA bacteremia.  ID has recommended line removal, but at this time, the pt elects to continue central lines despite risks.   Patient failed NG clamping trial 1/11, IR consulted for PEG placement.   In Endo 1/14 getting PEG.  Labs:  Lytes:  All electrolytes removed from TNA currently d/t elevated Phos which is improved today. No other labs today.  Renal/Hepatic Function:  (1/13)SCr stable/wnl. ALT/ALT, Tbili wnl, alk phos 484 (slightly improving)  CBGs:  At goal < 150 mg/dl on SSI and insulin in TNA bag Chol/Trigs:  WNL(1/13) Prealbumin:  29(1/13)  Plan:   Continue Clinimix 5/15(without electrolytes) at 115 ml/hr.   MD - with removal of electrolytes from Clinimix, consider alternative therapy to prevent Na+ from dropping  further(NS at [facility] currently). Alk phos elevation could be due to patient's current disease state vs. TNA effects - TNA with less dextrose now could help.  Continue 11 units/L of regular insulin in TNA.  IV fat emulsion 20% at 10 ml/hr on MWF only due to ongoing shortage.  Standard multivitamins and trace elements on MWF only due to ongoing shortage.  F/u plan for starting enteral nutrition.  Check Bmet, Phos, Mag in am.  Charolotte Eke, PharmD, pager [phone]. 12/30/2012,10:10 AM.

## 2012-12-30 NOTE — Op Note (Signed)
[facility] [address] [place], [address]   OPERATIVE PROCEDURE REPORT  PATIENT: [name], [name]  MR#: [id number] BIRTHDATE: 1956-08-25 , 56  yrs. old GENDER: Male ENDOSCOPIST: Louis Meckel, MD REFERRED BY: PROCEDURE DATE: 12/30/2012 PROCEDURE:   Diagnostic small bowel enteroscopy ASA CLASS:   Class IV INDICATIONS:1.  xray showed obstruction. MEDICATIONS: MAC sedation, administered by CRNA TOPICAL ANESTHETIC:  DESCRIPTION OF PROCEDURE:   After the risks benefits and alternatives of the procedure were thoroughly explained, informed consent was obtained.  The     endoscope was introduced through the mouth  and advanced to the proximal jejunum jejunum , limited by Without limitations.   The instrument was slowly withdrawn as the mucosa was fully examined.    Inflammation was found.  Again noted was moderate edema  of the gastric folds. Duodenum was grossly dilated. There was fluid pulled at the proximal end of the jejunal stent. Both the colonoscope and pediatric colonoscope were passed into the distal portion of the stent but could not be passed beyond. Multiple attempts were made to pass a guidewire beyond the jejunal stent but were unsuccessful. There was obstruction distal to the stent with what appeared to be extrinsic compression and possible infiltration of the mucosa by tumor.  Similar changes were seen surrounding the jejunal stent. Retroflexed views revealed no abnormalities.    The scope was then withdrawn from the patient and the procedure terminated.  COMPLICATIONS: There were no complications. ENDOSCOPIC IMPRESSION:  Obstruction of the jejunum  distal to the jejunal stent from both extrinsic compression and possible tumor infiltration of the jejunum Progression of tumor infiltration of the jejunum RECOMMENDATIONS:  REPEAT Continue NG tube decompressionEXAM:  _______________________________ eSignedLouis Meckel, MD 12/30/2012  12:38 PM   CC:

## 2012-12-31 ENCOUNTER — Encounter (HOSPITAL_COMMUNITY): Payer: Self-pay | Admitting: Gastroenterology

## 2012-12-31 DIAGNOSIS — E871 Hypo-osmolality and hyponatremia: Secondary | ICD-10-CM

## 2012-12-31 LAB — GLUCOSE, CAPILLARY
Glucose-Capillary: 130 mg/dL — ABNORMAL HIGH (ref 70–99)
Glucose-Capillary: 131 mg/dL — ABNORMAL HIGH (ref 70–99)
Glucose-Capillary: 135 mg/dL — ABNORMAL HIGH (ref 70–99)

## 2012-12-31 LAB — BASIC METABOLIC PANEL
BUN: 44 mg/dL — ABNORMAL HIGH (ref 6–23)
Chloride: 95 mEq/L — ABNORMAL LOW (ref 96–112)
Glucose, Bld: 122 mg/dL — ABNORMAL HIGH (ref 70–99)
Potassium: 3.5 mEq/L (ref 3.5–5.1)
Sodium: 126 mEq/L — ABNORMAL LOW (ref 135–145)

## 2012-12-31 LAB — MAGNESIUM: Magnesium: 1.2 mg/dL — ABNORMAL LOW (ref 1.5–2.5)

## 2012-12-31 MED ORDER — M.V.I. ADULT IV INJ
INTRAVENOUS | Status: AC
  Administered 2012-12-31: 17:00:00 via INTRAVENOUS
  Filled 2012-12-31: qty 2760

## 2012-12-31 MED ORDER — MAGNESIUM SULFATE 40 MG/ML IJ SOLN
2.0000 g | Freq: Once | INTRAMUSCULAR | Status: AC
  Administered 2012-12-31: 2 g via INTRAVENOUS
  Filled 2012-12-31: qty 50

## 2012-12-31 MED ORDER — ENOXAPARIN SODIUM 40 MG/0.4ML ~~LOC~~ SOLN
40.0000 mg | SUBCUTANEOUS | Status: DC
  Administered 2013-01-01 – 2013-01-08 (×8): 40 mg via SUBCUTANEOUS
  Filled 2012-12-31 (×10): qty 0.4

## 2012-12-31 MED ORDER — FAT EMULSION 20 % IV EMUL
250.0000 mL | INTRAVENOUS | Status: AC
  Administered 2012-12-31: 250 mL via INTRAVENOUS
  Filled 2012-12-31: qty 250

## 2012-12-31 MED ORDER — CEFAZOLIN SODIUM 1-5 GM-% IV SOLN
1.0000 g | Freq: Once | INTRAVENOUS | Status: AC
  Administered 2013-01-01: 1 g via INTRAVENOUS
  Filled 2012-12-31: qty 50

## 2012-12-31 NOTE — Consult Note (Signed)
WOC ostomy consult  Stoma type/location: RLQ Ileostomy, midline upper quadrant fistula Stomal assessment/size: 1 inch round stoma (1pc pouch); 7/8 inch oval wound (medium Eakin pouch) Peristomal assessment: intact Treatment options for stomal/peristomal skin: none indicated Output: scant in each of two pouches Ostomy pouching: 1pc.  Education provided: Patient reports he is preparing for discharge with HH/Hospice followup.  Pouching supplies at bedside for 3 pouch changes.  Recommend twice weekly to once weekly: current systems are holding greater than 4 days with little to no output. I will not follow closely.  Please re-consult if needed between visits. Thanks, Ladona Mow, MSN, RN, [facility], CWOCN [phone])

## 2012-12-31 NOTE — Progress Notes (Signed)
Late entry This Clinical research associate spoke with patient; patient's son at bedside and wife on phone to follow up on call from Sharmon Leyden [facility] to inform of family request for [facility]) services after discharge; wife voiced many questions and is concerned about being able to care for patient at home given his complex physical care needs; per notes no set discharge date and plan in place for possible IR PEG placement-  this writer will speak again with Mrs Blanke tomorrow to further address concerns and questions related to home with [facility].     Per Palliative MD notes re-meet with patient and family Thursday 1/16 to assist with solidifying discharge plans; this writer spoke with patient's wife and other family members - time for PMT re-meet confirmed for Thursday @ 5:30 pm to accommodate patient's wife who only recently started back to work.    Valente David, RN 12/31/2012, 9:06 PM [facility] Palliative Medicine Team RN Liaison [phone]

## 2012-12-31 NOTE — Progress Notes (Signed)
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 Percocet (Oxycodone-Acetaminophen)     hallucination   Patient Measurements: Height: 6\' 4"  (193 cm) Weight: 204 lb (92.534 kg) IBW/kg (Calculated) : 86.8   Vital Signs: Temp: 98.4 F (36.9 C) (01/15 0501) Temp src: Oral (01/15 0501) BP: 105/68 mmHg (01/15 0501) Pulse Rate: 93  (01/15 0501) Intake/Output from previous day: 01/14 0701 - 01/15 0700 In: 4029.6 [P.O.:50; I.V.:1314.2; TPN:2665.4] Out: 2232.5 [Urine:675; Emesis/NG output:1450; Drains:107.5]  Labs:  [facility] 12/29/12 0355  WBC 9.4  HGB 9.4*  HCT 29.1*  PLT 258  APTT --  INR --     Basename 12/31/12 0435 12/30/12 0505 12/29/12 0355  NA 126* -- 130*  K 3.5 -- 3.8  CL 95* -- 95*  CO2 20 -- 25  GLUCOSE 122* -- 120*  BUN 44* -- 27*  CREATININE 0.97 -- 0.83  LABCREA -- -- --  CREAT24HRUR -- -- --  CALCIUM 10.1 -- 10.4  MG 1.2* -- 1.6  PHOS 3.9 4.4 5.3*  PROT -- -- 7.2  ALBUMIN -- -- 2.7*  AST -- -- 27  ALT -- -- 49  ALKPHOS -- -- 484*  BILITOT -- -- 0.9  BILIDIR -- -- --  IBILI -- -- --  PREALBUMIN -- -- 29.0  TRIG -- -- 110  CHOLHDL -- -- --  CHOL -- -- 146   Estimated Creatinine Clearance: 104.4 ml/min (by C-G formula based on Cr of 0.97).    Insulin Requirements in the past 24 hours:  CBG < 150, required 4 units SSI, 11 units/L in TPN.  Per [facility] med list, patient was on SSI and regular insulin in TPN (15 units/L).    Nutritional Goals:   RD recs (1/8):  Kcal: 2600-2800, Protein: 130-140 gm, Fluid: >2.6L/day.    TPN @  [facility] = 2624 Kcals, 140g Protein, fluid (125 ml/hr over 24 hrs)  Goal rate inpatient: Clinimix E5/20 at 128ml/hr + Lipids MWF to provide Protein 138gm and 2909 kcal MWF, 2429 Kcal TTSS for weekly avg of 2634 kcal.  Current Nutrition:   NPO (as of 12/20/12)  Clinimix 5/15(without  electrolytes) at 140ml/hr.  mIVF = NS @ KVO  Assessment: 46 YOM with complex history including terminal peritoneal carcinomatosis s/p chemo.  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  Pt with LLQ drain, LUQ drain, ileostomy, choly drain, NG tube.  Pt transferred to [facility] on 12/20[facility]11 from [facility] for SBO management, N/V, high NG output.  No further plans for chemotherapy or surgery at this time.  Pt developed hypotension and tachycardia and transferred to ICU on 1/6 with MRSA bacteremia.  ID has recommended line removal, but at this time, the pt elects to continue central lines despite risks.   Patient failed NG clamping trial 1/11, IR consulted for PEG placement.   Failed attempt at stent revision on 1/14 due to progression of tumor infiltration.  Labs:  Lytes:  All lytes now dropping due to no lytes in TNA currently including mag and Na being low and K being at low end of normal. Phos now WNL after being above goal.  Renal/Hepatic Function:  (1/13)SCr stable/wnl. ALT/ALT, Tbili wnl, alk phos 484 (slightly improving)  CBGs:  At goal < 150 mg/dl on SSI and insulin in TNA bag Chol/Trigs:  WNL(1/13) Prealbumin:  29(1/13)  Plan:   Change Clinimix 5/15(without electrolytes)  to E 5/15 with electrolytes at same rate of 115 ml/hr.   Will replacement low mag level with 2g of IV magnesium sulfate but cannot adjust Na -  management per Md  Continue 11 units/L of regular insulin in TNA.  IV fat emulsion 20% at 10 ml/hr on MWF only due to ongoing shortage.  Standard multivitamins and trace elements on MWF only due to ongoing shortage.  F/u plan for starting enteral nutrition.  Check Bmet, Phos, Mag in am.   Hessie Knows, PharmD, BCPS Pager [phone] 12/31/2012 8:19 AM

## 2012-12-31 NOTE — Progress Notes (Signed)
Patient ZO:XWRUEAV SHANTANU STRAUCH      DOB: [birth year]      WUJ:[id number]  Met with Lahoma Rocker with Dr. Sharl Ma .  Updated Deivi on inability to open obstructions and that this would mean he can eat by mouth. Discussed what this mean for the future and how we will likely only be able to support him with TPN and try to get a gastrotomy tube to day his gastric secretions .  He is trying to cope with being at the end of his life despite his brave fight.  He finally sad to me" I have lived a good life".  He is now will to let us help him have a good death.  He has given me permission to talk with Hospice about services.  I will addend this note after speaking with them.  I have updated the primary physician and care manager.  I will contact [name] and Dr. [name] later this am to update them as well.   Full note to follow with updates  Lashe Oliveira L. [name], MD MBA The Palliative Medicine Team at [facility] Phone: [phone] Pager: [phone]

## 2012-12-31 NOTE — Care Management (Signed)
CM spoke with patient concerning dc planing. Pt spoke of dc plan home with [facility]. Pt provided with choice lsit, per pt choice [facility] to provide services upon d/c. CM spoke with [facility] rep Melva @ [phone]. Per rep TPN covered 90%. Pt request hospital bed, bedside table, & RW for home use. CM left contact infor for pt's wife to call to discuss any further needs.   Leonie Green [phone]

## 2012-12-31 NOTE — Progress Notes (Signed)
Physical Therapy Treatment Patient Details Name: [name] MRN: [id number] DOB: 12-26-1955 Today's Date: 12/31/2012 Time: 4098-1191 PT Time Calculation (min): 26 min  PT Assessment / Plan / Recommendation Comments on Treatment Session  Pt limited today by pain, numbness in right leg and need for continous NG suction.  However, he was able to move well to transfer to chair.  Recommend pt try to stand with RW and step in place as he is able to feel better with mobiilty and possibly progress to walking if he feels better    Follow Up Recommendations        Does the patient have the potential to tolerate intense rehabilitation     Barriers to Discharge        Equipment Recommendations       Recommendations for Other Services    Frequency     Plan Discharge plan remains appropriate;Frequency remains appropriate (pt will benefit from PT since he is so motivated)    Precautions / Restrictions Precautions Precautions: Fall Precaution Comments: pt with multiple lines and drains with NG tube collecting copious continuous outpt.  Restrictions Weight Bearing Restrictions: No   Pertinent Vitals/Pain Pt c/o cramping chest pain.  Nursing aware    Mobility  Bed Mobility Bed Mobility: Supine to Sit Supine to Sit: With rails;HOB elevated;4: Min assist (min assist for tubes and drains) Sitting - Scoot to Edge of Bed: 4: Min assist Details for Bed Mobility Assistance: pt is able to move himself, but needs assist for ng tube and drains Transfers Transfers: Sit to Stand;Stand to Sit Sit to Stand: 4: Min assist;With upper extremity assist;From bed Sit to Stand: Patient Percentage: 90% Stand to Sit: With upper extremity assist;To chair/3-in-1;5: Supervision;To bed Details for Transfer Assistance:  repeated sit to stand 3 times for strengthening in empasis on controlled descent to strengthen quads Ambulation/Gait Ambulation/Gait Assistance: 4: Min assist Ambulation/Gait: Patient  Percentage: 60% Ambulation Distance (Feet): 3 Feet Ambulation/Gait Assistance Details: pt needs assist for balance and  stability.  He needs to use RW for support Gait Pattern: Decreased step length - right;Decreased weight shift to right;Antalgic Gait velocity: decreased General Gait Details: pt with diffuculty with right leg due to numbness. He has some c/o cramping pain in ches with transfer.  Nursing notified.  Did not advance gait due to chest pain (pt reports he has had it for a couple of days) and because he needs to stay hooked up to suction via NG tube due to copious output. Stairs: No Wheelchair Mobility Wheelchair Mobility: No    Exercises General Exercises - Lower Extremity Ankle Circles/Pumps: AROM;Both;10 reps;Supine Quad Sets: AROM;Both;10 reps;Supine Gluteal Sets: AROM;Both;5 reps;Standing Hip ABduction/ADduction: AROM;Both;10 reps;Supine Hip Flexion/Marching: AAROM;10 reps;Supine   PT Diagnosis:    PT Problem List:   PT Treatment Interventions:     PT Goals Acute Rehab PT Goals Time For Goal Achievement: 01/06/13 Potential to Achieve Goals: Good Pt will go Supine/Side to Sit: with modified independence PT Goal: Supine/Side to Sit - Progress: Met Pt will go Sit to Stand: with supervision PT Goal: Sit to Stand - Progress: Progressing toward goal Pt will go Stand to Sit: with supervision PT Goal: Stand to Sit - Progress: Progressing toward goal Pt will Ambulate: 16 - 50 feet;with mod assist;with least restrictive assistive device  Visit Information  Last PT Received On: 12/31/12 Assistance Needed: +2    Subjective Data  Subjective: "I want to walk" Patient Stated Goal: to get stronger   Cognition  Overall  Cognitive Status: Appears within functional limits for tasks assessed/performed Arousal/Alertness: Awake/alert Orientation Level: Appears intact for tasks assessed Behavior During Session: [facility] for tasks performed    Balance  Static Sitting  Balance Static Sitting - Balance Support: No upper extremity supported;Feet supported Static Sitting - Level of Assistance: 7: Independent Static Standing Balance Static Standing - Balance Support: Bilateral upper extremity supported;During functional activity Static Standing - Level of Assistance: 5: Stand by assistance  End of Session PT - End of Session Activity Tolerance: Patient limited by pain Patient left: in chair;with nursing in room Nurse Communication: Mobility status   GP     Rosey Bath K. [name], [place] [phone] 12/31/2012, 11:36 AM

## 2012-12-31 NOTE — Progress Notes (Signed)
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 [name] in [facility] which has left him with multiple drains and fistulas. He also had recent cholecystitis. [facility]. 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 A duodenal stent was placed by GI during second attempt. He however continued to be obstructed. He was transferred to [facility] on 1/3 for evaluation and management. Since hospitalization, he has continued to have copious NG tube drainage. Surgeons and GI do not see any surgical/intervention options. Oncology does not recommend any further chemotherapy at this time. Patient in the interim developed septic shock from MRSA bacteremia/likely line sepsis. Palliative team consulted for goals of care-patient wishes to continue aggressive course.    Assessment/Plan:  Septic Shock secondary to MRSA bacteremia -? Line sepsis (has PICC line and Port-A-Cath): Less likely from Cholangitis.  - currently on vancomycin. Patient was transferred to ICU  for hypotension and tachycardia. - ID following, recommended removal of PICC line & Port-A-Cath, line holiday and prolonged IV antibiotics. Blood cultures were 2 x 2 positive for MRSA. Repeat blood cultures 1/11 negative to date. Patient wishes to keep the lines until any alternative meals of feeding is decided. Patient finally agreed on getting hospice involved on Dr [name] 's request. We will continue IV vancomycin for now through the PICC. patient knows that continuing the PICC puts him at high risks for infection.Pancytopenia: Leukopenia and thrombocytopenia  have resolved. H/h stable.   Hypokalemia and hypomagnesemia:  Pharmacy replacing potassium and magnesium. Improved. Has hyponatremia which is new.  Peritoneal carcinomatosis  diagnosed in December 2012 s/p extensive debulking surgery at [facility] twice (1st time January 2013 second time 08/2012) . Patient has had multiple surgical procedures . Palliative did goals of care and patient has chosen to proceed with medical treatment options as much as possible.  Small bowel obstruction/duodenal obstruction despite stent: Currently conservative management-n.p.o., and TPN. Failed NGT clamping trial after 5-1/2 hours on 1/11. Surgery has consulted IR for PEG placement. Failed attempt at stent revision on 1/14 - progression of tumor infiltration . GI have no further recommendations. Awaiting  evaluation by IR  -Cholecystitis status post cholecystostomy drain placed at [facility] - IR performed cholecystostomy tube change on 1/6.  Code Status: Full  Family Communication: Discussed with patient, spouse and sister at bedside.   Disposition Plan:  Patient unable to have a gastrostomy tube placed endoscopically for decompression. Pending IR evaluation. Unclear if IR guided gastrostomy tube will be successfully placed. If so, he will need to be on NG tube. Giver no alternate means of feeding , he will need to be on TPN despite being bacteremic. Patient refuses to have his PICC and Port A cath removed.  Hospice will be following patient.  Consultants:  Oncology  [facility]  IR  General surgery.  Critical care medicine.  Palliative care medicine  ID   Procedures:  NG tube  Change of cholecystostomy tube by IR on 1/6  Diagnostic small bowel enteroscopy 1/14   Antibiotics:  IV vancomycin 1/6 >  IV Zosyn 1/6 > 1/8  Subjective:  C/o left sided chest discomfort with movement and coughing   Objective: Filed Vitals:   12/30/12 1556 12/30/12  2051 12/31/12 0501 12/31/12 1015  BP: 110/80 107/73  105/68 108/63  Pulse: 90 94 93 102  Temp: 97.6 F (36.4 C) 98 F (36.7 C) 98.4 F (36.9 C) 97.6 F (36.4 C)  TempSrc: Oral Oral Oral Oral  Resp:  21 18 19   Height:      Weight:      SpO2: 98% 100% 100% 100%    Intake/Output Summary (Last 24 hours) at 12/31/12 1648 Last data filed at 12/31/12 0759  Gross per 24 hour  Intake 3199.59 ml  Output 1782.5 ml  Net 1417.09 ml   Filed Weights   12/23/12 0315 12/24/12 0400 12/30/12 0843  Weight: 93.5 kg (206 lb 2.1 oz) 92.7 kg (204 lb 5.9 oz) 92.534 kg (204 lb)    Exam: General: Alert and awake, oriented x3, not in any acute distress.  CVS: N s1&s2  no murmur rubs or gallops  Chest: clear to auscultation bilaterally, no wheezing, rales or rhonchi  Abdomen: soft nontender, nondistended,RLQ ostomy. RUQ cholecystostomy drain. Upper midline Eakin's pouch. LLQ drain Ext: warm, no edema Neuro: Cranial nerves II-XII intact, no focal neurological deficits     Data Reviewed: Basic Metabolic Panel:  Lab 12/31/12 4098 12/30/12 0505 12/29/12 0355 12/28/12 0320 12/27/12 0430 12/26/12 0530  NA 126* -- 130* 133* 133* 133*  K 3.5 -- 3.8 3.8 3.7 3.7  CL 95* -- 95* 99 101 102  CO2 20 -- 25 25 23 23   GLUCOSE 122* -- 120* 121* 129* 122*  BUN 44* -- 27* 23 20 17   CREATININE 0.97 -- 0.83 0.71 0.64 0.62  CALCIUM 10.1 -- 10.4 9.9 9.7 9.5  MG 1.2* -- 1.6 1.5 1.6 1.4*  PHOS 3.9 4.4 5.3* 4.8* 5.0* --   Liver Function Tests:  Lab 12/29/12 0355 12/27/12 0430 12/25/12 0500  AST 27 24 34  ALT 49 52 63*  ALKPHOS 484* 519* 449*  BILITOT 0.9 1.0 1.2  PROT 7.2 6.2 5.2*  ALBUMIN 2.7* 2.4* 1.9*   No results found for this basename: LIPASE:5,AMYLASE:5 in the last 168 hours No results found for this basename: AMMONIA:5 in the last 168 hours CBC:  Lab 12/29/12 0355 12/27/12 0430 12/26/12 0530  WBC 9.4 6.5 4.1  NEUTROABS 5.8 -- --  HGB 9.4* 8.6* 7.8*  HCT 29.1* 26.3* 24.3*  MCV 85.8 84.6 84.4  PLT 258 205 155   Cardiac Enzymes: No results  found for this basename: CKTOTAL:5,CKMB:5,CKMBINDEX:5,TROPONINI:5 in the last 168 hours BNP (last 3 results)  Basename 12/22/12 1700  PROBNP 33.8   CBG:  Lab 12/31/12 1307 12/31/12 0611 12/30/12 2353 12/30/12 1713 12/30/12 1454  GLUCAP 143* 135* 133* 132* 140*    Recent Results (from the past 240 hour(s))  CULTURE, BLOOD (ROUTINE X 2)     Status: Normal   Collection Time   12/22/12 12:55 PM      Component Value Range Status Comment   Specimen Description BLOOD LEFT HAND   Final    Special Requests BOTTLES DRAWN AEROBIC AND ANAEROBIC [facility]   Final    Culture  Setup Time 12/22/2012 22:48   Final    Culture     Final    Value: METHICILLIN RESISTANT STAPHYLOCOCCUS AUREUS     Note: RIFAMPIN AND GENTAMICIN SHOULD NOT BE USED AS SINGLE DRUGS FOR TREATMENT OF STAPH INFECTIONS. CRITICAL RESULT CALLED TO, READ BACK BY AND VERIFIED WITH: JENNIFER FUQUAY @ 1258 12/24/12 BY KRAWS     Note: Gram Stain Report Called to,Read Back By and Verified  With: Burnell Blanks @ 1254 12/23/12 BY KRAWS   Report Status 12/25/2012 FINAL   Final    Organism ID, Bacteria METHICILLIN RESISTANT STAPHYLOCOCCUS AUREUS   Final   CULTURE, BLOOD (ROUTINE X 2)     Status: Normal   Collection Time   12/22/12  1:05 PM      Component Value Range Status Comment   Specimen Description BLOOD LEFT HAND   Final    Special Requests BOTTLES DRAWN AEROBIC AND ANAEROBIC [facility]   Final    Culture  Setup Time 12/22/2012 22:48   Final    Culture     Final    Value: STAPHYLOCOCCUS AUREUS     Note: SUSCEPTIBILITIES PERFORMED ON PREVIOUS CULTURE WITHIN THE LAST 5 DAYS.     Note: Gram Stain Report Called to,Read Back By and Verified With: KIM Richardson Dopp @ 1254 12/23/12 BY KRAWS   Report Status 12/25/2012 FINAL   Final   MRSA PCR SCREENING     Status: Abnormal   Collection Time   12/22/12  5:42 PM      Component Value Range Status Comment   MRSA by PCR POSITIVE (*) NEGATIVE Final   CULTURE, BLOOD (ROUTINE X 2)     Status: Normal (Preliminary result)    Collection Time   12/27/12  4:30 AM      Component Value Range Status Comment   Specimen Description BLOOD RIGHT PICC   Final    Special Requests BOTTLES DRAWN AEROBIC AND ANAEROBIC 5CC   Final    Culture  Setup Time 12/27/2012 13:21   Final    Culture     Final    Value:        BLOOD CULTURE RECEIVED NO GROWTH TO DATE CULTURE WILL BE HELD FOR 5 DAYS BEFORE ISSUING A FINAL NEGATIVE REPORT   Report Status PENDING   Incomplete      Studies: Dg C-arm 61-120 Min-no Report  12/30/2012  CLINICAL DATA: duodenal stricture   C-ARM 61-120 MINUTES  Fluoroscopy was utilized by the requesting physician.  No radiographic  interpretation.      Scheduled Meds:   . antiseptic oral rinse  15 mL Mouth Rinse BID  . enoxaparin (LOVENOX) injection  40 mg Subcutaneous Q24H  . insulin aspart  0-9 Units Subcutaneous Q6H  . lip balm  1 application Topical BID  . metoCLOPramide  10 mg Intravenous QID  . pantoprazole  40 mg Intravenous Q12H  . sodium chloride  10-40 mL Intracatheter Q12H  . vancomycin  1,000 mg Intravenous Q12H   Continuous Infusions:   . sodium chloride 10 mL/hr at 12/24/12 1441  . TPN (CLINIMIX) +/- additives     And  . fat emulsion    . TPN (CLINIMIX) +/- additives 115 mL/hr at 12/30/12 1801      Time spent:25 minutes    Luwana Butrick  [facility] Pager [phone]. If 8PM-8AM, please contact night-coverage at [email or web address], password [facility] 12/31/2012, 4:48 PM  LOS: 12 days

## 2012-12-31 NOTE — Progress Notes (Signed)
Unfortunately there is nothing further to offer [name] endoscopically.  May try placing a gastrostomy tube for decompression but this could not be done endoscopically because of inability to approximate the stomach to the abdominal wall.

## 2012-12-31 NOTE — Progress Notes (Signed)
[facility]  Date of Admission:  12/19/2012  Antibiotics: Vancomycin  Subjective: No complaints  Objective: Temp:  [97.6 F (36.4 C)-98.4 F (36.9 C)] 97.6 F (36.4 C) (01/15 1015) Pulse Rate:  [93-102] 102  (01/15 1015) Resp:  [18-21] 19  (01/15 1015) BP: (105-108)/(63-73) 108/63 mmHg (01/15 1015) SpO2:  [100 %] 100 % (01/15 1015)   Lab Results Lab Results  Component Value Date   WBC 9.4 12/29/2012   HGB 9.4* 12/29/2012   HCT 29.1* 12/29/2012   MCV 85.8 12/29/2012   PLT 258 12/29/2012    Lab Results  Component Value Date   CREATININE 0.97 12/31/2012   BUN 44* 12/31/2012   NA 126* 12/31/2012   K 3.5 12/31/2012   CL 95* 12/31/2012   CO2 20 12/31/2012    Lab Results  Component Value Date   ALT 49 12/29/2012   AST 27 12/29/2012   ALKPHOS 484* 12/29/2012   BILITOT 0.9 12/29/2012      Microbiology: Recent Results (from the past 240 hour(s))  CULTURE, BLOOD (ROUTINE X 2)     Status: Normal   Collection Time   12/22/12 12:55 PM      Component Value Range Status Comment   Specimen Description BLOOD LEFT HAND   Final    Special Requests BOTTLES DRAWN AEROBIC AND ANAEROBIC [facility]   Final    Culture  Setup Time 12/22/2012 22:48   Final    Culture     Final    Value: METHICILLIN RESISTANT STAPHYLOCOCCUS AUREUS     Note: RIFAMPIN AND GENTAMICIN SHOULD NOT BE USED AS SINGLE DRUGS FOR TREATMENT OF STAPH INFECTIONS. CRITICAL RESULT CALLED TO, READ BACK BY AND VERIFIED WITH: JENNIFER FUQUAY @ 1258 12/24/12 BY KRAWS     Note: Gram Stain Report Called to,Read Back By and Verified With: Burnell Blanks @ 1254 12/23/12 BY KRAWS   Report Status 12/25/2012 FINAL   Final    Organism ID, Bacteria METHICILLIN RESISTANT STAPHYLOCOCCUS AUREUS   Final   CULTURE, BLOOD (ROUTINE X 2)     Status: Normal   Collection Time   12/22/12  1:05 PM      Component Value Range Status Comment   Specimen Description BLOOD LEFT HAND   Final    Special Requests BOTTLES DRAWN AEROBIC AND ANAEROBIC  [facility]   Final    Culture  Setup Time 12/22/2012 22:48   Final    Culture     Final    Value: STAPHYLOCOCCUS AUREUS     Note: SUSCEPTIBILITIES PERFORMED ON PREVIOUS CULTURE WITHIN THE LAST 5 DAYS.     Note: Gram Stain Report Called to,Read Back By and Verified With: KIM Richardson Dopp @ 1254 12/23/12 BY KRAWS   Report Status 12/25/2012 FINAL   Final   MRSA PCR SCREENING     Status: Abnormal   Collection Time   12/22/12  5:42 PM      Component Value Range Status Comment   MRSA by PCR POSITIVE (*) NEGATIVE Final   CULTURE, BLOOD (ROUTINE X 2)     Status: Normal (Preliminary result)   Collection Time   12/27/12  4:30 AM      Component Value Range Status Comment   Specimen Description BLOOD RIGHT PICC   Final    Special Requests BOTTLES DRAWN AEROBIC AND ANAEROBIC 5CC   Final    Culture  Setup Time 12/27/2012 13:21   Final    Culture     Final  Value:        BLOOD CULTURE RECEIVED NO GROWTH TO DATE CULTURE WILL BE HELD FOR 5 DAYS BEFORE ISSUING A FINAL NEGATIVE REPORT   Report Status PENDING   Incomplete     Studies/Results: Dg C-arm 61-120 Min-no Report  12/30/2012  CLINICAL DATA: duodenal stricture   C-ARM 61-120 MINUTES  Fluoroscopy was utilized by the requesting physician.  No radiographic  interpretation.      Assessment/Plan: 1)  MRSA bacteremia - I discussed case with Dr. [name] and understand that he is unable to be fed other than with TPN through PICC.  I think a reasonable option is to continue with IV vancomycin with hospice indefinitely at this time to avoid interrupting his TPN.   -he should continue with vancomycin with a trough goal of 15-20 -he should have a weekly CBC with diff, CMP and vanco trough while on therapy to assure no side effects  I will sign off, please call for any other needs  Staci Righter, MD [facility] [phone] pager   12/31/2012, 4:26 PM

## 2012-12-31 NOTE — Progress Notes (Signed)
Occupational Therapy Treatment Patient Details Name: [name] MRN: [id number] DOB: 14-Apr-1956 Today's Date: 12/31/2012 Time: 1308-6578 OT Time Calculation (min): 31 min  OT Assessment / Plan / Recommendation Comments on Treatment Session Pt tolerated working on bath at sit to stand level from the chair but fatigues rapidly with standing activity. Will benefit from more OT.    Follow Up Recommendations  SNF;LTACH    Barriers to Discharge       Equipment Recommendations  3 in 1 bedside comode    Recommendations for Other Services    Frequency Min 2X/week   Plan Discharge plan remains appropriate    Precautions / Restrictions Precautions Precautions: Fall Precaution Comments: multiple lines/drains Restrictions Weight Bearing Restrictions: No        ADL  Upper Body Bathing: Performed;Chest;Right arm;Left arm;Abdomen;Supervision/safety;Set up Where Assessed - Upper Body Bathing: Unsupported sitting Lower Body Bathing: Performed;Minimal assistance;Other (comment) (mostly for lower legs and feet. Slight min assist to stand and balance while washing periareas.) Where Assessed - Lower Body Bathing: Supported sit to stand Upper Body Dressing: Simulated;Minimal assistance;Other (comment) (due to IV/lines) Where Assessed - Upper Body Dressing: Unsupported sitting Lower Body Dressing: Simulated;Maximal assistance;Other (comment) (socks. unable to reach to feet.) Where Assessed - Lower Body Dressing: Unsupported sitting Toilet Transfer: Simulated;Minimal assistance Toilet Transfer Method: Sit to stand Equipment Used: Rolling walker ADL Comments: Pt does fairly well with sit to stand but once standing, fatigues rapidly with functional task. Was only able to tolerate about 30 seconds of standing in order for pt to wash periareas and then had to sit down. He is unable to reach to feet to wash, don socks, etc. May benefit from AE education.     OT Diagnosis:    OT Problem List:     OT Treatment Interventions:     OT Goals ADL Goals ADL Goal: Additional Goal #1 - Progress: Progressing toward goals  Visit Information  Last OT Received On: 12/31/12 Assistance Needed: +2 (for lines)    Subjective Data  Subjective: I get so weak standing Patient Stated Goal: agreeable to work with OT; none stated   Prior Functioning       Cognition  Overall Cognitive Status: Appears within functional limits for tasks assessed/performed Arousal/Alertness: Awake/alert Orientation Level: Appears intact for tasks assessed Behavior During Session: [facility] for tasks performed    Mobility  Shoulder Instructions Transfers Transfers: Sit to Stand;Stand to Sit Sit to Stand: 4: Min assist;With upper extremity assist;From chair/3-in-1 Stand to Sit: 4: Min assist;With upper extremity assist;To chair/3-in-1 Details for Transfer Assistance: slight min assist to rise and steady. cues for hand placement and overall safety       Exercises      Balance     End of Session OT - End of Session Activity Tolerance: Patient limited by fatigue Patient left: in chair;with call bell/phone within reach  GO     Lennox Laity [phone] 12/31/2012, 10:46 AM

## 2012-12-31 NOTE — Progress Notes (Addendum)
Pt. Stated that after ambulating to the chair with PT he had a pain in his chest that was cramping but not severe. He stated it happens with both rest and ambulation and it only happens occasionally. VS were taken and his BP was 108/63, Pulse 102, Resp 19 and O2 was 100% on room air. He had no other symptoms or complaints. Dr. Gonzella Lex was notified. Will continue to monitor.

## 2012-12-31 NOTE — Progress Notes (Signed)
ANTIBIOTIC CONSULT NOTE - FOLLOW UP  Pharmacy Consult for Vancomycin Indication: MRSA Bacteremia   Allergies  Allergen Reactions  . Percocet (Oxycodone-Acetaminophen)     hallucination    Patient Measurements: Height: 6\' 4"  (193 cm) Weight: 204 lb (92.534 kg) IBW/kg (Calculated) : 86.8  Adjusted Body Weight:   Vital Signs: Temp: 98.4 F (36.9 C) (01/15 0501) Temp src: Oral (01/15 0501) BP: 105/68 mmHg (01/15 0501) Pulse Rate: 93  (01/15 0501) Intake/Output from previous day: 01/14 0701 - 01/15 0700 In: 4029.6 [P.O.:50; I.V.:1314.2; TPN:2665.4] Out: 2232.5 [Urine:675; Emesis/NG output:1450; Drains:107.5] Intake/Output from this shift: Total I/O In: -  Out: 250 [Emesis/NG output:250]  Labs:  [facility] 12/31/12 0435 12/29/12 0355  WBC -- 9.4  HGB -- 9.4*  PLT -- 258  LABCREA -- --  CREATININE 0.97 0.83   Estimated Creatinine Clearance: 104.4 ml/min (by C-G formula based on Cr of 0.97).  Basename 12/28/12 1744  VANCOTROUGH 29.9*  VANCOPEAK --  Drue Dun --  GENTTROUGH --  GENTPEAK --  GENTRANDOM --  TOBRATROUGH --  TOBRAPEAK --  TOBRARND --  AMIKACINPEAK --  AMIKACINTROU --  AMIKACIN --     Microbiology: Recent Results (from the past 720 hour(s))  CULTURE, BLOOD (ROUTINE X 2)     Status: Normal   Collection Time   12/22/12 12:55 PM      Component Value Range Status Comment   Specimen Description BLOOD LEFT HAND   Final    Special Requests BOTTLES DRAWN AEROBIC AND ANAEROBIC [facility]   Final    Culture  Setup Time 12/22/2012 22:48   Final    Culture     Final    Value: METHICILLIN RESISTANT STAPHYLOCOCCUS AUREUS     Note: RIFAMPIN AND GENTAMICIN SHOULD NOT BE USED AS SINGLE DRUGS FOR TREATMENT OF STAPH INFECTIONS. CRITICAL RESULT CALLED TO, READ BACK BY AND VERIFIED WITH: JENNIFER FUQUAY @ 1258 12/24/12 BY KRAWS     Note: Gram Stain Report Called to,Read Back By and Verified With: Burnell Blanks @ 1254 12/23/12 BY KRAWS   Report Status 12/25/2012 FINAL   Final    Organism ID, Bacteria METHICILLIN RESISTANT STAPHYLOCOCCUS AUREUS   Final   CULTURE, BLOOD (ROUTINE X 2)     Status: Normal   Collection Time   12/22/12  1:05 PM      Component Value Range Status Comment   Specimen Description BLOOD LEFT HAND   Final    Special Requests BOTTLES DRAWN AEROBIC AND ANAEROBIC [facility]   Final    Culture  Setup Time 12/22/2012 22:48   Final    Culture     Final    Value: STAPHYLOCOCCUS AUREUS     Note: SUSCEPTIBILITIES PERFORMED ON PREVIOUS CULTURE WITHIN THE LAST 5 DAYS.     Note: Gram Stain Report Called to,Read Back By and Verified With: KIM Richardson Dopp @ 1254 12/23/12 BY KRAWS   Report Status 12/25/2012 FINAL   Final   MRSA PCR SCREENING     Status: Abnormal   Collection Time   12/22/12  5:42 PM      Component Value Range Status Comment   MRSA by PCR POSITIVE (*) NEGATIVE Final   CULTURE, BLOOD (ROUTINE X 2)     Status: Normal (Preliminary result)   Collection Time   12/27/12  4:30 AM      Component Value Range Status Comment   Specimen Description BLOOD RIGHT PICC   Final    Special Requests BOTTLES DRAWN AEROBIC AND ANAEROBIC 5CC  Final    Culture  Setup Time 12/27/2012 13:21   Final    Culture     Final    Value:        BLOOD CULTURE RECEIVED NO GROWTH TO DATE CULTURE WILL BE HELD FOR 5 DAYS BEFORE ISSUING A FINAL NEGATIVE REPORT   Report Status PENDING   Incomplete     Anti-infectives     Start     Dose/Rate Route Frequency Ordered Stop   12/29/12 1000   vancomycin (VANCOCIN) IVPB 1000 mg/200 mL premix        1,000 mg 200 mL/hr over 60 Minutes Intravenous Every 12 hours 12/28/12 1849     12/24/12 1000   vancomycin (VANCOCIN) IVPB 1000 mg/200 mL premix  Status:  Discontinued        1,000 mg 200 mL/hr over 60 Minutes Intravenous Every 8 hours 12/24/12 0836 12/28/12 1840   12/23/12 2200   vancomycin (VANCOCIN) IVPB 1000 mg/200 mL premix  Status:  Discontinued        1,000 mg 200 mL/hr over 60 Minutes Intravenous Every 12 hours 12/23/12 1040 12/24/12 0836    12/22/12 1600   vancomycin (VANCOCIN) IVPB 1000 mg/200 mL premix  Status:  Discontinued        1,000 mg 200 mL/hr over 60 Minutes Intravenous Every 8 hours 12/22/12 1539 12/23/12 1040   12/22/12 1600   piperacillin-tazobactam (ZOSYN) IVPB 3.375 g  Status:  Discontinued        3.375 g 12.5 mL/hr over 240 Minutes Intravenous Every 8 hours 12/22/12 1539 12/24/12 1441          Assessment:  57 YO M with h/o terminal peritoneal carcinomatosis s/p chemo. Pt developed hypotension and tachycardia 1/6 with possible sepsis (cholangitis after biliary drain manipulation)   Day #10/42 vancomycin 1g q12 for MRSA bacteremia - dose adjusted from q8 on 1/12  SCr still WNL but did increase some  Patient continues to be afebrile  Noted plan per ID note  Goal of Therapy:  Vancomycin trough level 15-20 mcg/ml Eradication of infection  Plan:  1) Continue current vancomycin dosing of 1g IV q12 for now 2) Will recheck a vanc trough tonight   Hessie Knows, PharmD, BCPS Pager [phone] 12/31/2012 8:38 AM

## 2012-12-31 NOTE — Progress Notes (Signed)
Patient [name]      DOB: 1956/06/14      WUJ:[id number]   Palliative Medicine Team at [facility] Progress Note    Subjective: Met with Lahoma Rocker and Dr. Sharl Ma to discuss failed revision of stent.  Heaven knows that his options are now further limited and that death is closer than he wants it to be because we can' t feed  him enterally.  He is now going to need a gastrostomy tube to vent his gastric secretions if it can be placed.  If the gastrostomy can not be placed he will be left with permanent NGT.  With this news it seems probable that the palliative approach would be to leave his picc and port in place as we can not remove them for any length of time without disrupting feeding, and it makes no sense to remove the picc and replace it as the port is likely infected.  I would suggest that we continue antibiotics as long as it is giving quality knowing  that we can meet the standard of care without compromising him further.  We checked with his insurance company they will pay 90 % for TPN and will pay for antibiotics.  We checked with the hospice of his choice and they have given tentative approval to care at home WITH TPN and Antibiotics. I believe that this is the best plan to give him time at home with his family and time to plan for his death.  He is willing to talk with Dr. Sharl Ma further about his code status and any other directives that will help his family.   Filed Vitals:   12/31/12 1015  BP: 108/63  Pulse: 102  Temp: 97.6 F (36.4 C)  Resp: 19   Physical exam:  General:  No acute distress, appears more withdrawn and gaunt today PERRL, EOMI , anicteric, mmm Chest: decreased but clear Abd:  Choley tube intact, ostomy minimally draining, fistualized area copious drainage Ext: interosseous wasting right greater than left with some clawing Neuro: awake , alert , oriented to time place and person and with capacity for making his own choices.  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  Patient has no other options for enteral feeding.  Best palliative choice for him to promote comfort and give him time with his family is to dc home with hospice keeping the TPN, and ABx going until he is overwhelmed by his disease.  He is thinking through his code status knowing that we have limited options and is agreeable to talk with Dr. Sharl Ma further.  I updated his daughter who has been helping her mom and dad with decisions. I reviewed the case with Dr. Gonzella Lex who will contact IR to get the attempt for gastrostomy scheduled.  Bland knows this may fail as well leaving him with an NGT to suction at discharge.  I have spoken with Dr. Luciana Axe.  He will make recommendations for home therapy and monitoring that therapy.  1.  FULL Code;  Dr. Sharl Ma will engage [facility] further on this matter.  Hospice WILL accept him even if he is a full code.  2.  MRSA Bacteremia:  Recommend no picc removal, no port removal.  Dr. Luciana Axe to make dosing recommendations  3.  Bowel obstruction terminal:  Gastrotomy tube if possible to vent secretions.  Could consider octreotide if unable to place and patient does not desire NGT to remain in place.  Octreotide , however, is expensive and recent literature questions its true worth as an antisecretory drug.  4.  Pain:  The patient has been using fentanyl IV.  I will ask my team to consider switching to a trial of MSIR to facilitate discharge to home.  5.  Wound care through hospice at discharge.  6.  Disposition:  Currently patient considering  home with hospice our team will remeet to solidify plans which may change depending on outcomes.   Prognosis: weeks at best, can switch to days very quickly if infection takes over.  PPS 40% mainly in bed but has been working with PT can get out of bed with walker and assistance.   Total Time: 830 am to 900 am  Christien Frankl L. [name], MD MBA The Palliative Medicine Team at [facility] Phone: [phone] Pager: [phone]'

## 2013-01-01 ENCOUNTER — Inpatient Hospital Stay (HOSPITAL_COMMUNITY): Payer: 59

## 2013-01-01 ENCOUNTER — Encounter (HOSPITAL_COMMUNITY): Payer: Self-pay | Admitting: Radiology

## 2013-01-01 LAB — COMPREHENSIVE METABOLIC PANEL
ALT: 67 U/L — ABNORMAL HIGH (ref 0–53)
AST: 29 U/L (ref 0–37)
Albumin: 2.7 g/dL — ABNORMAL LOW (ref 3.5–5.2)
Albumin: 2.6 g/dL — ABNORMAL LOW (ref 3.5–5.2)
BUN: 50 mg/dL — ABNORMAL HIGH (ref 6–23)
CO2: 19 mEq/L (ref 19–32)
Calcium: 10.4 mg/dL (ref 8.4–10.5)
Chloride: 94 mEq/L — ABNORMAL LOW (ref 96–112)
Creatinine, Ser: 0.93 mg/dL (ref 0.50–1.35)
GFR calc Af Amer: >90 mL/min (ref >90)
GFR calc non Af Amer: >90 mL/min (ref >90)
Sodium: 122 mEq/L — ABNORMAL LOW (ref 135–145)
Total Bilirubin: 1 mg/dL (ref 0.3–1.2)
Total Protein: 7.4 g/dL (ref 6.0–8.3)

## 2013-01-01 LAB — GLUCOSE, CAPILLARY

## 2013-01-01 LAB — MAGNESIUM: Magnesium: 1.8 mg/dL (ref 1.5–2.5)

## 2013-01-01 LAB — PHOSPHORUS: Phosphorus: 4.8 mg/dL — ABNORMAL HIGH (ref 2.3–4.6)

## 2013-01-01 MED ORDER — METHOCARBAMOL 100 MG/ML IJ SOLN
500.0000 mg | Freq: Three times a day (TID) | INTRAVENOUS | Status: DC | PRN
  Administered 2013-01-01: 500 mg via INTRAVENOUS
  Filled 2013-01-01 (×2): qty 5

## 2013-01-01 MED ORDER — SODIUM CHLORIDE 0.9 % IV SOLN
INTRAVENOUS | Status: DC
  Administered 2013-01-02 – 2013-01-09 (×7): via INTRAVENOUS

## 2013-01-01 MED ORDER — IOHEXOL 300 MG/ML  SOLN
50.0000 mL | Freq: Once | INTRAMUSCULAR | Status: AC | PRN
  Administered 2013-01-01: 10 mL

## 2013-01-01 MED ORDER — FENTANYL CITRATE 0.05 MG/ML IJ SOLN
INTRAMUSCULAR | Status: AC | PRN
  Administered 2013-01-01 (×2): 50 ug via INTRAVENOUS

## 2013-01-01 MED ORDER — MIDAZOLAM HCL 2 MG/2ML IJ SOLN
INTRAMUSCULAR | Status: AC | PRN
  Administered 2013-01-01 (×2): 1 mg via INTRAVENOUS

## 2013-01-01 MED ORDER — INSULIN REGULAR HUMAN 100 UNIT/ML IJ SOLN
INTRAVENOUS | Status: AC
  Administered 2013-01-01: 18:00:00 via INTRAVENOUS
  Filled 2013-01-01: qty 2760

## 2013-01-01 MED ORDER — VANCOMYCIN HCL 1000 MG IV SOLR
750.0000 mg | Freq: Two times a day (BID) | INTRAVENOUS | Status: DC
  Administered 2013-01-01 – 2013-01-07 (×13): 750 mg via INTRAVENOUS
  Filled 2013-01-01 (×14): qty 750

## 2013-01-01 NOTE — Progress Notes (Signed)
Received critical vanc trough of 25.3 at 3:59. Called JC in pharmacy. Holding dose per pharmacists orders.

## 2013-01-01 NOTE — Progress Notes (Signed)
Spoke with Dr Archer Asa through [facility] to clarify orders for suction for Gastric tube, leave connected to [facility] as before. Dr Gonzella Lex will need to discuss if pt can have anything by mouth for comfort and if so, not until tomorrow.

## 2013-01-01 NOTE — Progress Notes (Signed)
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 [name] in [facility] which has left him with multiple drains and fistulas. He also had recent cholecystitis. [facility]. 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 A duodenal stent was placed by GI during second attempt. He however continued to be obstructed. He was transferred to [facility] on 1/3 for evaluation and management. Since hospitalization, he has continued to have copious NG tube drainage. Surgeons and GI do not see any surgical/intervention options. Oncology does not recommend any further chemotherapy at this time. Patient in the interim developed septic shock from MRSA bacteremia/likely line sepsis. Palliative team consulted for goals of care-patient wishes to continue aggressive course.   Assessment/Plan:  Septic Shock secondary to MRSA bacteremia  -? Line sepsis (has PICC line and Port-A-Cath): Less likely from Cholangitis.  - currently on vancomycin. Patient was transferred to ICU for hypotension and tachycardia. - ID following, recommended removal of PICC line & Port-A-Cath, line holiday and prolonged IV antibiotics. Blood cultures were 2 x 2 positive for MRSA. Repeat blood cultures 1/11 negative to date. Patient wishes to keep the lines until any alternative meals of feeding is decided. Patient finally agreed on getting hospice involved on Dr [name] 's request. We will continue IV vancomycin for now through the PICC. patient knows that continuing the PICC puts him at high risks for infection.Pancytopenia: Leukopenia and thrombocytopenia  have resolved. H/h stable.  Hypokalemia and hypomagnesemia:  Pharmacy replacing potassium and magnesium. Improved.    Hyponatremia  new finding. Likely with hypovolemia. Ordered IV NS. Monitor in am.  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 Patient has had multiple surgical procedures . Palliative did goals of care . Now agrees to d/w hospice. Further GOC meeting today.  Small bowel obstruction/duodenal obstruction despite stent: Currently conservative management-n.p.o., and TPN. Failed NGT clamping trial after 5-1/2 hours on 1/11. Surgery has consulted IR for PEG placement. Failed attempt at stent revision on 1/14 - progression of tumor infiltration . GI have no further recommendations. Plan for gastrostomy tube by IR.   -Cholecystitis status post cholecystostomy drain placed at [facility] - IR performed cholecystostomy tube change on 1/6.   Code Status: Full  Family Communication: Discussed with patient,   Disposition Plan:  Patient unable to have a gastrostomy tube placed endoscopically for decompression. Pending IR evaluation. Unclear if IR guided gastrostomy tube will be successfully placed. If so, he will need to be on NG tube. Giver no alternate means of feeding , he will need to be on TPN despite being bacteremic. Patient refuses to have his PICC and Port A cath removed.  Hospice will be following patient.   Consultants:  Oncology  [facility]  IR  General surgery.  Critical care medicine.  Palliative care medicine  ID   Procedures:  NG tube  Change of cholecystostomy tube by IR on 1/6  Diagnostic small bowel enteroscopy 1/14   Antibiotics:  IV vancomycin 1/6 >  IV Zosyn 1/6 > 1/8   Subjective:  patient again c/o some rib pains and leg spasms. Ordered IV baclofen  Objective: Filed Vitals:   12/31/12 1015 12/31/12 2159 01/01/13 0516 01/01/13  1321  BP: 108/63 103/69 109/64 96/62  Pulse: 102 96 100 105  Temp: 97.6 F (36.4 C) 97.6 F (36.4 C) 99.2 F (37.3 C) 98.2 F (36.8 C)  TempSrc: Oral Oral Oral Oral  Resp: 19 18 18 18   Height:      Weight:      SpO2: 100% 100% 100% 100%    Intake/Output Summary (Last 24 hours) at 01/01/13 1431 Last data filed at 01/01/13 1321  Gross per 24 hour  Intake   1595 ml  Output   2475 ml  Net   -880 ml   Filed Weights   12/23/12 0315 12/24/12 0400 12/30/12 0843  Weight: 93.5 kg (206 lb 2.1 oz) 92.7 kg (204 lb 5.9 oz) 92.534 kg (204 lb)    Exam: General: No acute distress.  CVS: N s1&s2 no murmur rubs or gallops  Chest: clear to auscultation bilaterally, no wheezing, rales or rhonchi  Abdomen: soft nontender, nondistended,RLQ ostomy. RUQ cholecystostomy drain. Upper midline Eakin's pouch. LLQ drain  Ext: warm, no edema  Neuro: AAOX 3 no focal neurological deficits    Data Reviewed: Basic Metabolic Panel:  Lab 01/01/13 1610 12/31/12 0435 12/30/12 0505 12/29/12 0355 12/28/12 0320 12/27/12 0430  NA 122* 126* -- 130* 133* 133*  K 3.7 3.5 -- 3.8 3.8 3.7  CL 91* 95* -- 95* 99 101  CO2 20 20 -- 25 25 23   GLUCOSE 131* 122* -- 120* 121* 129*  BUN 48* 44* -- 27* 23 20  CREATININE 0.95 0.97 -- 0.83 0.71 0.64  CALCIUM 10.4 10.1 -- 10.4 9.9 9.7  MG 1.8 1.2* -- 1.6 1.5 1.6  PHOS 5.0* 3.9 4.4 5.3* 4.8* --   Liver Function Tests:  Lab 01/01/13 0255 12/29/12 0355 12/27/12 0430  AST 29 27 24   ALT 67* 49 52  ALKPHOS 385* 484* 519*  BILITOT 1.0 0.9 1.0  PROT 7.4 7.2 6.2  ALBUMIN 2.7* 2.7* 2.4*   No results found for this basename: LIPASE:5,AMYLASE:5 in the last 168 hours No results found for this basename: AMMONIA:5 in the last 168 hours CBC:  Lab 12/29/12 0355 12/27/12 0430 12/26/12 0530  WBC 9.4 6.5 4.1  NEUTROABS 5.8 -- --  HGB 9.4* 8.6* 7.8*  HCT 29.1* 26.3* 24.3*  MCV 85.8 84.6 84.4  PLT 258 205 155   Cardiac Enzymes: No results found for this basename:  CKTOTAL:5,CKMB:5,CKMBINDEX:5,TROPONINI:5 in the last 168 hours BNP (last 3 results)  Basename 12/22/12 1700  PROBNP 33.8   CBG:  Lab 01/01/13 1202 01/01/13 0601 12/31/12 2350 12/31/12 1750 12/31/12 1307  GLUCAP 159* 151* 131* 130* 143*    Recent Results (from the past 240 hour(s))  MRSA PCR SCREENING     Status: Abnormal   Collection Time   12/22/12  5:42 PM      Component Value Range Status Comment   MRSA by PCR POSITIVE (*) NEGATIVE Final   CULTURE, BLOOD (ROUTINE X 2)     Status: Normal (Preliminary result)   Collection Time   12/27/12  4:30 AM      Component Value Range Status Comment   Specimen Description BLOOD RIGHT PICC   Final    Special Requests BOTTLES DRAWN AEROBIC AND ANAEROBIC 5CC   Final    Culture  Setup Time 12/27/2012 13:21   Final    Culture     Final    Value:  BLOOD CULTURE RECEIVED NO GROWTH TO DATE CULTURE WILL BE HELD FOR 5 DAYS BEFORE ISSUING A FINAL NEGATIVE REPORT   Report Status PENDING   Incomplete      Studies: No results found.  Scheduled Meds:   . antiseptic oral rinse  15 mL Mouth Rinse BID  . enoxaparin (LOVENOX) injection  40 mg Subcutaneous Q24H  . insulin aspart  0-9 Units Subcutaneous Q6H  . lip balm  1 application Topical BID  . metoCLOPramide  10 mg Intravenous QID  . pantoprazole  40 mg Intravenous Q12H  . sodium chloride  10-40 mL Intracatheter Q12H  . vancomycin  750 mg Intravenous Q12H   Continuous Infusions:   . sodium chloride 10 mL/hr at 12/24/12 1441  . sodium chloride 100 mL/hr at 01/01/13 1257  . TPN (CLINIMIX) +/- additives 115 mL/hr at 12/31/12 1714   And  . fat emulsion 250 mL (12/31/12 1714)  . TPN (CLINIMIX) +/- additives        Time spent: 25 minutes    Jocob Dambach  [facility] Pager [phone]. If 8PM-8AM, please contact night-coverage at [email or web address], password [facility] 01/01/2013, 2:31 PM  LOS: 13 days

## 2013-01-01 NOTE — ED Notes (Signed)
SS 6

## 2013-01-01 NOTE — ED Notes (Signed)
SS 6 

## 2013-01-01 NOTE — Progress Notes (Addendum)
Brief Pharmacy Note: TNA/Lytes  S/O: Spoke with Dr. Sharl Ma re: electrolytes. Na is low (122), Phos high (5). Reports that pt is complaining of muscle cramps. Explained that TNA has standard concentration of electrolytes and cannot adjust concentration in TNA. Dr Sharl Ma gave verbal order over telephone for STAT CMET and magnesium. He would like pharmacy to replace electrolytes as appropriate and adjust MIVF as necessary   A/P Ordered STAT CMET, Mg and Phos Will f/u results when they are available. MIVF @ 114ml/hr and TNA @ 160ml/hr - question if hyponatremia is from fluid overload although negative I/O documented   Gwen Her PharmD  [phone] 01/01/2013 7:04 PM    ADDENDUM  Labs resulted. Mg and K wnl. Phos still slightly elevated. Na low but up from this AM value.   Plan No change for now Re-evaluate in AM  Gwen Her PharmD  [phone] 01/01/2013 9:52 PM

## 2013-01-01 NOTE — Progress Notes (Signed)
ANTIBIOTIC CONSULT NOTE - FOLLOW UP  Pharmacy Consult for vancomycin Indication: MRSA Bacteremia   Allergies  Allergen Reactions  . Percocet (Oxycodone-Acetaminophen)     hallucination    Patient Measurements: Height: 6\' 4"  (193 cm) Weight: 204 lb (92.534 kg) IBW/kg (Calculated) : 86.8  Adjusted Body Weight:   Vital Signs: Temp: 97.6 F (36.4 C) (01/15 2159) Temp src: Oral (01/15 2159) BP: 103/69 mmHg (01/15 2159) Pulse Rate: 96  (01/15 2159) Intake/Output from previous day: 01/15 0701 - 01/16 0700 In: -  Out: 1200 [Urine:300; Emesis/NG output:900] Intake/Output from this shift: Total I/O In: -  Out: 300 [Urine:300]  Labs:  [facility] 01/01/13 0255 12/31/12 0435  WBC -- --  HGB -- --  PLT -- --  LABCREA -- --  CREATININE 0.95 0.97   Estimated Creatinine Clearance: 106.6 ml/min (by C-G formula based on Cr of 0.95).  Basename 01/01/13 0230  VANCOTROUGH 25.3*  VANCOPEAK --  Drue Dun --  GENTTROUGH --  GENTPEAK --  GENTRANDOM --  TOBRATROUGH --  TOBRAPEAK --  TOBRARND --  AMIKACINPEAK --  AMIKACINTROU --  AMIKACIN --     Microbiology: Recent Results (from the past 720 hour(s))  CULTURE, BLOOD (ROUTINE X 2)     Status: Normal   Collection Time   12/22/12 12:55 PM      Component Value Range Status Comment   Specimen Description BLOOD LEFT HAND   Final    Special Requests BOTTLES DRAWN AEROBIC AND ANAEROBIC [facility]   Final    Culture  Setup Time 12/22/2012 22:48   Final    Culture     Final    Value: METHICILLIN RESISTANT STAPHYLOCOCCUS AUREUS     Note: RIFAMPIN AND GENTAMICIN SHOULD NOT BE USED AS SINGLE DRUGS FOR TREATMENT OF STAPH INFECTIONS. CRITICAL RESULT CALLED TO, READ BACK BY AND VERIFIED WITH: JENNIFER FUQUAY @ 1258 12/24/12 BY KRAWS     Note: Gram Stain Report Called to,Read Back By and Verified With: Burnell Blanks @ 1254 12/23/12 BY KRAWS   Report Status 12/25/2012 FINAL   Final    Organism ID, Bacteria METHICILLIN RESISTANT STAPHYLOCOCCUS AUREUS    Final   CULTURE, BLOOD (ROUTINE X 2)     Status: Normal   Collection Time   12/22/12  1:05 PM      Component Value Range Status Comment   Specimen Description BLOOD LEFT HAND   Final    Special Requests BOTTLES DRAWN AEROBIC AND ANAEROBIC [facility]   Final    Culture  Setup Time 12/22/2012 22:48   Final    Culture     Final    Value: STAPHYLOCOCCUS AUREUS     Note: SUSCEPTIBILITIES PERFORMED ON PREVIOUS CULTURE WITHIN THE LAST 5 DAYS.     Note: Gram Stain Report Called to,Read Back By and Verified With: KIM Richardson Dopp @ 1254 12/23/12 BY KRAWS   Report Status 12/25/2012 FINAL   Final   MRSA PCR SCREENING     Status: Abnormal   Collection Time   12/22/12  5:42 PM      Component Value Range Status Comment   MRSA by PCR POSITIVE (*) NEGATIVE Final   CULTURE, BLOOD (ROUTINE X 2)     Status: Normal (Preliminary result)   Collection Time   12/27/12  4:30 AM      Component Value Range Status Comment   Specimen Description BLOOD RIGHT PICC   Final    Special Requests BOTTLES DRAWN AEROBIC AND ANAEROBIC 5CC   Final  Culture  Setup Time 12/27/2012 13:21   Final    Culture     Final    Value:        BLOOD CULTURE RECEIVED NO GROWTH TO DATE CULTURE WILL BE HELD FOR 5 DAYS BEFORE ISSUING A FINAL NEGATIVE REPORT   Report Status PENDING   Incomplete     Anti-infectives     Start     Dose/Rate Route Frequency Ordered Stop   01/01/13 1200   vancomycin (VANCOCIN) 750 mg in sodium chloride 0.9 % 150 mL IVPB        750 mg 150 mL/hr over 60 Minutes Intravenous Every 12 hours 01/01/13 0417     01/01/13 0800   ceFAZolin (ANCEF) IVPB 1 g/50 mL premix        1 g 100 mL/hr over 30 Minutes Intravenous  Once 12/31/12 1657     12/29/12 1000   vancomycin (VANCOCIN) IVPB 1000 mg/200 mL premix  Status:  Discontinued        1,000 mg 200 mL/hr over 60 Minutes Intravenous Every 12 hours 12/28/12 1849 01/01/13 0406   12/24/12 1000   vancomycin (VANCOCIN) IVPB 1000 mg/200 mL premix  Status:  Discontinued        1,000 mg 200  mL/hr over 60 Minutes Intravenous Every 8 hours 12/24/12 0836 12/28/12 1840   12/23/12 2200   vancomycin (VANCOCIN) IVPB 1000 mg/200 mL premix  Status:  Discontinued        1,000 mg 200 mL/hr over 60 Minutes Intravenous Every 12 hours 12/23/12 1040 12/24/12 0836   12/22/12 1600   vancomycin (VANCOCIN) IVPB 1000 mg/200 mL premix  Status:  Discontinued        1,000 mg 200 mL/hr over 60 Minutes Intravenous Every 8 hours 12/22/12 1539 12/23/12 1040   12/22/12 1600   piperacillin-tazobactam (ZOSYN) IVPB 3.375 g  Status:  Discontinued        3.375 g 12.5 mL/hr over 240 Minutes Intravenous Every 8 hours 12/22/12 1539 12/24/12 1441          Assessment: Patient with high vancomycin level.  0300 dose NOT given.  Prior three doses given at proper times.  Goal of Therapy:  Vancomycin trough level 15-20 mcg/ml  Plan:  Measure antibiotic drug levels at steady state Follow up culture results Change to vancomycin 750mg  iv q12hr, next dose at [address] [place] 01/01/2013,4:17 AM

## 2013-01-01 NOTE — H&P (Signed)
HPI: [name] is an 57 y.o. male who unfortunately has terminal peritoneal carcinomatosis. He is now at the stage where he has persistent N/V unless he has gastric decompression. He currently has an NG and is in process of moving forward with palliative care. IR is requested to place decompressive gastrostomy tube. Case and all pertinent imaging reviewed with multiple IR MDs. Pt is very high risk but can attempt perc g-tube/drain today. PMHx, meds, and events of this admission reviewed.  Past Medical History:  Past Medical History  Diagnosis Date  . Hypertension   . GERD (gastroesophageal reflux disease)   . Anemia   . Small bowel obstruction 2013  . 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 Acute cholecystitis s/p perc draianage [id number] 12/20/2012    Past Surgical History:  Past Surgical History  Procedure Date  . Tonsillectomy   . Esophagogastroduodenoscopy 12/11/2012    Procedure: ESOPHAGOGASTRODUODENOSCOPY (EGD);  Surgeon: Louis Meckel, MD;  Location[facility] ENDOSCOPY;  Service: Endoscopy;  Laterality: N/A;  . Duodenal stent placement 12/11/2012    Procedure: DUODENAL STENT PLACEMENT;  Surgeon: Louis Meckel, MD;  Location: [facility] ENDOSCOPY;  Service: Endoscopy;  Laterality: N/A;  . Esophagogastroduodenoscopy 12/13/2012    Procedure: ESOPHAGOGASTRODUODENOSCOPY (EGD);  Surgeon: Louis Meckel, MD;  Location: [facility] OR;  Service: Endoscopy;  Laterality: N/A;  with attempt to stent duodenal stricture  . Peg placement 12/12/2012    Procedure: PERCUTANEOUS ENDOSCOPIC GASTROSTOMY (PEG) PLACEMENT;  Surgeon: Louis Meckel, MD;  Location: [facility] ENDOSCOPY;  Service: Endoscopy;  Laterality: N/A;  . Small intestine surgery jan 2013    [facility].  SB resection, ileostomy, gastrostomy  . Debulking sep 2013    [facility].  ileal SB resection, debulking of peritoneal carcinomatosis, intraperitoneal chemotherapy  . Colon surgery oct  2013    [facility] transverse colon perforation  . Enteroscopy 12/30/2012    Procedure: ENTEROSCOPY;  Surgeon: Louis Meckel, MD;  Location: [facility] ENDOSCOPY;  Service: Endoscopy;  Laterality: N/A;  With possible stent placement; please have duodenal stent available  . Duodenal stent placement 12/30/2012    Procedure: DUODENAL STENT PLACEMENT;  Surgeon: Louis Meckel, MD;  Location: [facility] ENDOSCOPY;  Service: Endoscopy;  Laterality: N/A;    Family History: History reviewed. No pertinent family history.  Social History:  reports that he has never smoked. He has never used smokeless tobacco. He reports that he does not drink alcohol or use illicit drugs.  Allergies:  Allergies  Allergen Reactions  . Percocet (Oxycodone-Acetaminophen)     hallucination    Medications: Medications Prior to Admission  Medication Sig Dispense Refill  . allopurinol (ZYLOPRIM) 100 MG tablet Take 100 mg by mouth daily.      Marland Kitchen aluminum-magnesium hydroxide-simethicone (MAALOX) 200-200-20 MG/5ML SUSP Take 30 mLs by mouth every 6 (six) hours as needed. For indigestion      . cholecalciferol (VITAMIN D) 1000 UNITS tablet Take 1,000 Units by mouth daily.      . cloNIDine (CATAPRES - DOSED IN MG/24 HR) 0.2 mg/24hr patch Place 1 patch onto the skin once a week. Apply every Friday      . gabapentin (NEURONTIN) 300 MG capsule Take 300 mg by mouth 3 (three) times daily.      Marland Kitchen HYDROcodone-acetaminophen (NORCO/VICODIN) 5-325 MG per tablet Take 1 tablet by mouth every 4 (four) hours as needed. For moderate pain      . ibuprofen (ADVIL,MOTRIN) 200 MG tablet  Take 200 mg by mouth every 6 (six) hours as needed. For mild pain      . menthol-cetylpyridinium (CEPACOL) 3 MG lozenge Take 1 lozenge by mouth every 2 (two) hours as needed. Sore throat      . metoCLOPramide (REGLAN) 5 MG/ML injection Inject 10 mg into the vein 4 (four) times daily.      . metoprolol succinate (TOPROL-XL) 25 MG 24 hr tablet Take 25 mg by mouth 2 (two) times  daily.      . Multiple Vitamins-Minerals (MULTIVITAMIN WITH MINERALS) tablet Take 1 tablet by mouth daily.      . nitroGLYCERIN (NITROSTAT) 0.4 MG SL tablet Place 0.4 mg under the tongue every 5 (five) minutes as needed. For chest pain, Hold for SBP<100      . ondansetron (ZOFRAN) 4 MG/2ML SOLN injection Inject 4 mg into the vein 4 (four) times daily.      . Ondansetron HCl (ZOFRAN) 2 MG/ML SOLN injection Inject 4 mg into the vein every 4 (four) hours as needed. For nausea/vomiting      . pantoprazole (PROTONIX) 40 MG injection Inject 40 mg into the vein every 12 (twelve) hours.      . pregabalin (LYRICA) 25 MG capsule Take 25 mg by mouth 2 (two) times daily.      . promethazine (PHENERGAN) 25 MG/ML injection Inject 25 mg into the vein every 8 (eight) hours as needed. Nausea/vomiting      . sennosides-docusate sodium (SENOKOT-S) 8.6-50 MG tablet Take 1 tablet by mouth 3 (three) times daily as needed. Constipation      . sodium bicarbonate 650 MG tablet Take 650 mg by mouth as needed. For clogged tube      . sodium chloride (OCEAN) 0.65 % nasal spray Place 1 spray into the nose as needed. Nasal congestion      . [DISCONTINUED] ondansetron (ZOFRAN) 4 MG tablet Take 4 mg by mouth every 8 (eight) hours as needed. For nausea        Please HPI for pertinent positives, otherwise complete 10 system ROS negative.  Physical Exam: Blood pressure 109/64, pulse 100, temperature 99.2 F (37.3 C), temperature source Oral, resp. rate 18, height 6\' 4"  (1.93 m), weight 204 lb (92.534 kg), SpO2 100.00%. Body mass index is 24.83 kg/(m^2).   General Appearance:  Alert, cooperative, no distress, appears stated age  Head:  Normocephalic, without obvious abnormality, atraumatic  ENT: Unremarkable, NG in rt nare  Neck: Supple, symmetrical, trachea midline, no adenopathy, thyroid: not enlarged, symmetric, no tenderness/mass/nodules  Lungs:   Clear to auscultation bilaterally, no w/r/r, respirations unlabored without  use of accessory muscles.  Chest Wall:  No tenderness or deformity  Heart:  Regular rate and rhythm, S1, S2 normal, no murmur, rub or gallop. Carotids 2+ without bruit.  Abdomen:   Soft, midline open wound. LUQ scar from previous surgical drain. RUQ chole drain intact, small amount bilious output.  Neurologic: Normal affect, no gross deficits.   Results for orders placed during the hospital encounter of 12/19/12 (from the past 48 hour(s))  GLUCOSE, CAPILLARY     Status: Abnormal   Collection Time   12/30/12  2:54 PM      Component Value Range Comment   Glucose-Capillary 140 (*) 70 - 99 mg/dL   GLUCOSE, CAPILLARY     Status: Abnormal   Collection Time   12/30/12  5:13 PM      Component Value Range Comment   Glucose-Capillary 132 (*) 70 - 99 mg/dL  GLUCOSE, CAPILLARY     Status: Abnormal   Collection Time   12/30/12 11:53 PM      Component Value Range Comment   Glucose-Capillary 133 (*) 70 - 99 mg/dL    Comment 1 Notify RN     BASIC METABOLIC PANEL     Status: Abnormal   Collection Time   12/31/12  4:35 AM      Component Value Range Comment   Sodium 126 (*) 135 - 145 mEq/L    Potassium 3.5  3.5 - 5.1 mEq/L    Chloride 95 (*) 96 - 112 mEq/L    CO2 20  19 - 32 mEq/L    Glucose, Bld 122 (*) 70 - 99 mg/dL    BUN 44 (*) 6 - 23 mg/dL    Creatinine, Ser 1.61  0.50 - 1.35 mg/dL    Calcium 09.6  8.4 - 10.5 mg/dL    GFR calc non Af Amer >90  >90 mL/min    GFR calc Af Amer >90  >90 mL/min   PHOSPHORUS     Status: Normal   Collection Time   12/31/12  4:35 AM      Component Value Range Comment   Phosphorus 3.9  2.3 - 4.6 mg/dL   MAGNESIUM     Status: Abnormal   Collection Time   12/31/12  4:35 AM      Component Value Range Comment   Magnesium 1.2 (*) 1.5 - 2.5 mg/dL   GLUCOSE, CAPILLARY     Status: Abnormal   Collection Time   12/31/12  6:11 AM      Component Value Range Comment   Glucose-Capillary 135 (*) 70 - 99 mg/dL    Comment 1 Notify RN     GLUCOSE, CAPILLARY     Status:  Abnormal   Collection Time   12/31/12  1:07 PM      Component Value Range Comment   Glucose-Capillary 143 (*) 70 - 99 mg/dL   GLUCOSE, CAPILLARY     Status: Abnormal   Collection Time   12/31/12  5:50 PM      Component Value Range Comment   Glucose-Capillary 130 (*) 70 - 99 mg/dL   GLUCOSE, CAPILLARY     Status: Abnormal   Collection Time   12/31/12 11:50 PM      Component Value Range Comment   Glucose-Capillary 131 (*) 70 - 99 mg/dL    Comment 1 Notify RN     VANCOMYCIN, TROUGH     Status: Abnormal   Collection Time   01/01/13  2:30 AM      Component Value Range Comment   Vancomycin Tr 25.3 (*) 10.0 - 20.0 ug/mL   COMPREHENSIVE METABOLIC PANEL     Status: Abnormal   Collection Time   01/01/13  2:55 AM      Component Value Range Comment   Sodium 122 (*) 135 - 145 mEq/L    Potassium 3.7  3.5 - 5.1 mEq/L    Chloride 91 (*) 96 - 112 mEq/L    CO2 20  19 - 32 mEq/L    Glucose, Bld 131 (*) 70 - 99 mg/dL    BUN 48 (*) 6 - 23 mg/dL    Creatinine, Ser 0.45  0.50 - 1.35 mg/dL    Calcium 40.9  8.4 - 10.5 mg/dL    Total Protein 7.4  6.0 - 8.3 g/dL    Albumin 2.7 (*) 3.5 - 5.2 g/dL    AST 29  0 -  37 U/L    ALT 67 (*) 0 - 53 U/L    Alkaline Phosphatase 385 (*) 39 - 117 U/L    Total Bilirubin 1.0  0.3 - 1.2 mg/dL    GFR calc non Af Amer >90  >90 mL/min    GFR calc Af Amer >90  >90 mL/min   MAGNESIUM     Status: Normal   Collection Time   01/01/13  2:55 AM      Component Value Range Comment   Magnesium 1.8  1.5 - 2.5 mg/dL   PHOSPHORUS     Status: Abnormal   Collection Time   01/01/13  2:55 AM      Component Value Range Comment   Phosphorus 5.0 (*) 2.3 - 4.6 mg/dL   GLUCOSE, CAPILLARY     Status: Abnormal   Collection Time   01/01/13  6:01 AM      Component Value Range Comment   Glucose-Capillary 151 (*) 70 - 99 mg/dL    Comment 1 Notify RN      Dg C-arm 61-120 Min-no Report  12/30/2012  CLINICAL DATA: duodenal stricture   C-ARM 61-120 MINUTES  Fluoroscopy was utilized by the  requesting physician.  No radiographic  interpretation.      Assessment/Plan Extensive peritoneal carcinomatosis with inoperable/untreatable SBO Needs decompression G-tube. Explained procedure to pt in detail, including high risk for bowel injury, risks and complications of sedation, and the possibility that we may not be successful and he may have to keep the NGT. Labs reviewed. Lovenox has been held since [employer] Consent signed in chart  Brayton El PA-C 01/01/2013, 8:54 AM

## 2013-01-01 NOTE — Procedures (Signed)
Interventional Radiology Procedure Note  Procedure:  Successful placement of a 47F percutaneous gastrostomy tube for decompression.  This tube is not designed for deeds or medicines to be pushed through it.  Complications: None Recommendations: - Connect tube to LWS PRN for relief of gastric distension  Signed,  Sterling Big, MD Vascular & Interventional Radiologist [facility] Radiology

## 2013-01-01 NOTE — Progress Notes (Signed)
NUTRITION FOLLOW UP  Intervention:   Parenteral nutrition; continued management per PharmD. Further nutrition support use to be discussed in palliative care meeting.   Nutrition Dx:   Inadequate oral intake, ongoing  Goal:   1. Parenteral nutrition; continued tolerance - met 2. Wt/wt change; monitor trends - wt stable 3. 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 Pt has been on TPN since September 2013.   Patient is receiving TPN with Clinimix E 5/15 @ 115 ml/hr. Lipids (20% IVFE @ 10 ml/hr), multivitamins, and trace elements are provided 3 times weekly (MWF) due to national backorder. Provides 2166 kcal and 138 grams protein daily (based on weekly average). Meets 83% minimum estimated kcal and 100% minimum estimated protein needs. Discussed calorie goals with pharmacist, awaiting further clarification of nutrition plans with palliative meeting this evening.   Pt with persistent low sodium which cannot be adjusted in TPN, MD made aware. Pt with elevated Alk phos however trending down. ALT slightly elevated today. PALB on 12/29/12 WNL. CBGs slightly elevated, pt getting 11 units/L of insulin in TPN.   Despite recommendations for removal of PICC and port-a-cath to reduce complications and mortality from staphylococcus aureus bacteria, pt refused their removal per infectious disease MD notes.   Pt had upper GI study 1/0 which showed very little contrast even threading its way through his stent which would mean that he could not safely remove the NG tube or eat per MD notes. Pt started NGT clamping trial 1/11 however pt had emesis later on that evening after 5.5 hours of tube being clamped and NGT suction was resumed.    Pt had small bowel enteroscopy 1/14 which showed obstruction of the jejunum distal to jejunal stent per GI notes which prevented pt from having a feeding tube placed. Pt remains TPN dependent. Pt is to have G tube placed today by IR for gastric decompression. NGT with 1.8L total output yesterday. Palliative care following pt with plans to have further discussion of pt's goals later on today.   Height: Ht Readings from Last 1 Encounters:  12/30/12 6\' 4"  (1.93 m)    Weight Status:   Wt Readings from Last 1 Encounters:  12/30/12 204 lb (92.534 kg)    Re-estimated needs:  Kcal: 2600-2800 Protein: 130-140g Fluid: >2.5L/day  Skin: Intact  Diet Order: NPO   Intake/Output Summary (Last 24 hours) at 01/01/13 1230 Last data filed at 01/01/13 1028  Gross per 24 hour  Intake   1595 ml  Output   2275 ml  Net   -680 ml    Last BM: 1/15   Labs:   Lab 01/01/13 0255 12/31/12 0435 12/30/12 0505 12/29/12 0355  NA 122* 126* -- 130*  K 3.7 3.5 -- 3.8  CL 91* 95* -- 95*  CO2 20 20 -- 25  BUN 48* 44* -- 27*  CREATININE 0.95 0.97 -- 0.83  CALCIUM 10.4 10.1 -- 10.4  MG 1.8 1.2* -- 1.6  PHOS 5.0* 3.9 4.4 --  GLUCOSE 131* 122* -- 120*    CBG (last 3)   Basename 01/01/13 1202 01/01/13 0601 12/31/12 2350  GLUCAP 159* 151* 131*    Scheduled Meds:   . antiseptic oral rinse  15 mL Mouth Rinse BID  . enoxaparin (  LOVENOX) injection  40 mg Subcutaneous Q24H  . insulin aspart  0-9 Units Subcutaneous Q6H  . lip balm  1 application Topical BID  . metoCLOPramide  10 mg Intravenous QID  . pantoprazole  40 mg Intravenous Q12H  . sodium chloride  10-40 mL Intracatheter Q12H  . vancomycin  750 mg Intravenous Q12H    Continuous Infusions:   . sodium chloride 10 mL/hr at 12/24/12 1441  . sodium chloride    . TPN (CLINIMIX) +/- additives 115 mL/hr at 12/31/12 1714   And  . fat emulsion 250 mL (12/31/12 1714)  . TPN [facility]) +/- additives      Levon Hedger MS, RD, LDN [phone]  Pager [phone] After Hours Pager

## 2013-01-01 NOTE — H&P (Signed)
Agree with PA note.  Will proceed with attempted placement of a decompressive gastrostomy tube.  Signed,  Sterling Big, MD Vascular & Interventional Radiologist [facility] Radiology

## 2013-01-01 NOTE — Progress Notes (Signed)
Brief note.. 57 yr old male with h/o adenocarcinoma, omental carcinomatosis, s/p extensive abdominal surgeries, multiple fistulas and drains and recent cholecystitis requiring cholecystostomy tube. Patient had jejunal stent, tumor caused obstruction beyond the stent. Now has venting gastrostomy tube. He also has MRSA bacteremia, with picc line and port in place. He is currently on IV vancomycin. ID recommends removing picc line and port a cath and replacing picc in 48 hrs after blood cultures are negative. Discussed in detail with patient and his family.  Recommendations: 1. Patient is partial code, wants CPR but no intubation or mechanical ventilation DNI 2. Patient wants the picc line removed and replaced. 3. Patient complains of muscle cramps, called the pharmacy to check electrolytes and replace as needed.

## 2013-01-01 NOTE — Progress Notes (Signed)
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 Percocet (Oxycodone-Acetaminophen)     hallucination   Patient Measurements: Height: 6\' 4"  (193 cm) Weight: 204 lb (92.534 kg) IBW/kg (Calculated) : 86.8   Vital Signs: Temp: 99.2 F (37.3 C) (01/16 0516) Temp src: Oral (01/16 0516) BP: 109/64 mmHg (01/16 0516) Pulse Rate: 100  (01/16 0516) Intake/Output from previous day: 01/15 0701 - 01/16 0700 In: 1595 [I.V.:220; TPN:1375] Out: 2325 [Urine:500; Emesis/NG output:1800; Drains:25]  Labs: No results found for this basename: WBC:3,HGB:3,HCT:3,PLT:3,APTT:3,INR:3 in the last 72 hours   Basename 01/01/13 0255 12/31/12 0435 12/30/12 0505  NA 122* 126* --  K 3.7 3.5 --  CL 91* 95* --  CO2 20 20 --  GLUCOSE 131* 122* --  BUN 48* 44* --  CREATININE 0.95 0.97 --  LABCREA -- -- --  CREAT24HRUR -- -- --  CALCIUM 10.4 10.1 --  MG 1.8 1.2* --  PHOS 5.0* 3.9 4.4  PROT 7.4 -- --  ALBUMIN 2.7* -- --  AST 29 -- --  ALT 67* -- --  ALKPHOS 385* -- --  BILITOT 1.0 -- --  BILIDIR -- -- --  IBILI -- -- --  PREALBUMIN -- -- --  TRIG -- -- --  CHOLHDL -- -- --  CHOL -- -- --   Estimated Creatinine Clearance: 106.6 ml/min (by C-G formula based on Cr of 0.95).   Insulin Requirements in the past 24 hours:  CBG < 150, required 3 units SSI, 11 units/L in TPN.  Per [facility] med list, patient was on SSI and regular insulin in TPN (15 units/L).    Nutritional Goals:   RD recs (1/8):  Kcal: 2600-2800, Protein: 130-140 gm, Fluid: >2.6L/day.    TPN @  [facility] = 2624 Kcals, 140g Protein, fluid (125 ml/hr over 24 hrs)  Goal rate inpatient: Clinimix E5/20 at 118ml/hr + Lipids MWF to provide Protein 138gm and 2909 kcal MWF, 2429 Kcal TTSS for weekly avg of 2634 kcal.  Current Nutrition:   NPO (as of 12/20/12)  Clinimix 5/15(without electrolytes) at  139ml/hr.  mIVF = NS @ KVO  Assessment: 84 YOM with complex history including terminal peritoneal carcinomatosis s/p chemo.  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  Pt with LLQ drain, LUQ drain, ileostomy, choly drain, NG tube.  Pt transferred to [facility] on 12/20/11 from [facility] for SBO management, N/V, high NG output.  No further plans for chemotherapy or surgery at this time.  Pt developed hypotension and tachycardia and transferred to ICU on 1/6 with MRSA bacteremia.  ID has recommended line removal, but at this time, the pt elects to continue central lines despite risks.   Patient failed NG clamping trial 1/11, IR consulted for PEG placement.   Failed attempt at stent revision on 1/14 due to progression of tumor infiltration.  Labs:  Judieth KeensJudieth Keens decreased yesterday: no lytes in TNA 1/14 Na remains low, cannot adjust in TNA. Mag replaced 1/15 **now Na low, Phos elevated, other lytes in range  Renal/Hepatic Function:  (1/13)SCr stable/wnl. ALT/ALT, Tbili wnl, alk phos 484 (slightly improving)  CBGs:  At goal < 150 mg/dl on SSI and insulin in TNA bag Chol/Trigs:  WNL(1/13) Prealbumin:  29(1/13)  Plan: at 1800  Change Clinimix back to plain 5/15 (no electrolytes) at 115 ml/hr.  MD management of low NA, cannot adjust in TNA  Continue 11 units/L of regular insulin in TNA.  IV fat emulsion 20% at 10 ml/hr on MWF only due to ongoing shortage.  Standard multivitamins and trace elements on MWF only due to ongoing shortage.  For attempt to place PEG for decompression only.  Check Bmet, Phos, in am.  Otho Bellows PharmD Pager [phone] 01/01/2013 11:15 AM

## 2013-01-02 DIAGNOSIS — R109 Unspecified abdominal pain: Secondary | ICD-10-CM | POA: Diagnosis present

## 2013-01-02 LAB — CULTURE, BLOOD (ROUTINE X 2)

## 2013-01-02 LAB — BASIC METABOLIC PANEL
BUN: 52 mg/dL — ABNORMAL HIGH (ref 6–23)
Chloride: 97 mEq/L (ref 96–112)
Creatinine, Ser: 1.05 mg/dL (ref 0.50–1.35)
GFR calc Af Amer: 90 mL/min — ABNORMAL LOW (ref >90)

## 2013-01-02 LAB — GLUCOSE, CAPILLARY
Glucose-Capillary: 137 mg/dL — ABNORMAL HIGH (ref 70–99)
Glucose-Capillary: 146 mg/dL — ABNORMAL HIGH (ref 70–99)

## 2013-01-02 MED ORDER — FAT EMULSION 20 % IV EMUL
250.0000 mL | INTRAVENOUS | Status: AC
  Administered 2013-01-02: 250 mL via INTRAVENOUS
  Filled 2013-01-02: qty 250

## 2013-01-02 MED ORDER — MORPHINE SULFATE 2 MG/ML IJ SOLN
2.0000 mg | Freq: Four times a day (QID) | INTRAMUSCULAR | Status: DC | PRN
  Administered 2013-01-02 – 2013-01-04 (×4): 2 mg via INTRAVENOUS
  Filled 2013-01-02 (×6): qty 1

## 2013-01-02 MED ORDER — TRACE MINERALS CR-CU-F-FE-I-MN-MO-SE-ZN IV SOLN
INTRAVENOUS | Status: AC
  Administered 2013-01-02: 17:00:00 via INTRAVENOUS
  Filled 2013-01-02: qty 2760

## 2013-01-02 MED ORDER — METHOCARBAMOL 100 MG/ML IJ SOLN
1000.0000 mg | Freq: Four times a day (QID) | INTRAVENOUS | Status: DC | PRN
  Administered 2013-01-02: 1000 mg via INTRAVENOUS
  Filled 2013-01-02 (×2): qty 10

## 2013-01-02 NOTE — Progress Notes (Signed)
Physical Therapy Treatment Patient Details Name: [name] MRN: [id number] DOB: 19-Jan-1956 Today's Date: 01/02/2013 Time: 8295-6213 PT Time Calculation (min): 28 min  PT Assessment / Plan / Recommendation Comments on Treatment Session  POD # 3 Duodenal Stent placement with multiple drains and gastric tube currently on suction.  RN detatched gastric tube for session.  Assisted pt OOB to amb in hallway 2 shorrt distances.  Amb limited by c/o ABD/chest pain and R foot 'Gout pain' stated pt.  Positioned in recliner, RN reconnected gastric tube and encouraged pt to stay OOB in recliner atleast 2 hours if possible.  Pt progressing slowly.    Follow Up Recommendations  SNF;LTACH     Does the patient have the potential to tolerate intense rehabilitation     Barriers to Discharge        Equipment Recommendations  Rolling walker with 5" wheels    Recommendations for Other Services    Frequency Min 3X/week   Plan Discharge plan remains appropriate    Precautions / Restrictions Precautions Precautions: Fall Precaution Comments: Multiple drains and gastric tube.Marland KitchenMarland KitchenMarland KitchenMarland Kitchenpt is NPO Restrictions Weight Bearing Restrictions: No   Pertinent Vitals/Pain C/o mod ABD/chest pain and MAX R LE pain (gout pt stated)   Mobility  Bed Mobility Bed Mobility: Supine to Sit Supine to Sit: 4: Min guard Details for Bed Mobility Assistance: increased time due to multiple lines/tube and c/o AND pain/pressure Transfers Transfers: Sit to Stand;Stand to Sit Sit to Stand: From bed;4: Min assist Stand to Sit: 4: Min assist;To chair/3-in-1 Details for Transfer Assistance: increased time and 25% VBCs on proper tech and hand placement, pt did have uncontrolled stand to sit despite VC's. Ambulation/Gait Ambulation/Gait Assistance: 4: Min assist Ambulation Distance (Feet): 35 Feet (22' then 64' with one sitting rest break) Assistive device: Rolling walker Ambulation/Gait Assistance Details: Pt c/o gout R LE along  with neuropathy demon more of a toe touch WB due to pain. Amb pt twice with one sitting rest break. Pt shollow breathing due to ABD discomfort. Positioned pt in recliner and encouraged him to stay OOB atleast 2 hrs. Gait Pattern: Step-to pattern;Decreased stance time - right;Trunk flexed;Decreased stride length Gait velocity: decreased     PT Goals                                                  progressing    Visit Information  Last PT Received On: 01/02/13 Assistance Needed: +2    Subjective Data  Subjective: I get muscle spasms and I have gout in R foot. Patient Stated Goal: to get better   Cognition       Balance     End of Session PT - End of Session Equipment Utilized During Treatment: Gait belt Activity Tolerance: Patient limited by fatigue;Patient limited by pain Patient left: in chair;with call bell/phone within reach;with nursing in room   Felecia Shelling  PTA [facility]  Acute  Rehab Pager     [phone]

## 2013-01-02 NOTE — Progress Notes (Signed)
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 Percocet (Oxycodone-Acetaminophen)     hallucination   Patient Measurements: Height: 6\' 4"  (193 cm) Weight: 204 lb (92.534 kg) IBW/kg (Calculated) : 86.8   Vital Signs: Temp: 98.9 F (37.2 C) (01/17 0620) Temp src: Oral (01/17 0620) BP: 96/65 mmHg (01/17 0620) Pulse Rate: 102  (01/17 0620) Intake/Output from previous day: 01/16 0701 - 01/17 0700 In: 3141.5 [I.V.:1234; TPN:1907.5] Out: 2055 [Urine:1055; Emesis/NG output:1000]  Labs: No results found for this basename: WBC:3,HGB:3,HCT:3,PLT:3,APTT:3,INR:3 in the last 72 hours   Basename 01/02/13 0619 01/01/13 1851 01/01/13 0255 12/31/12 0435  NA 125* 125* 122* --  K 3.9 3.8 3.7 --  CL 97 94* 91* --  CO2 17* 19 20 --  GLUCOSE 143* 131* 131* --  BUN 52* 50* 48* --  CREATININE 1.05 0.93 0.95 --  LABCREA -- -- -- --  CREAT24HRUR -- -- -- --  CALCIUM 9.6 10.2 10.4 --  MG -- 1.8 1.8 1.2*  PHOS 3.9 4.8* 5.0* --  PROT -- 7.3 7.4 --  ALBUMIN -- 2.6* 2.7* --  AST -- 24 29 --  ALT -- 57* 67* --  ALKPHOS -- 357* 385* --  BILITOT -- 1.0 1.0 --  BILIDIR -- -- -- --  IBILI -- -- -- --  PREALBUMIN -- -- -- --  TRIG -- -- -- --  CHOLHDL -- -- -- --  CHOL -- -- -- --   Estimated Creatinine Clearance: 96.4 ml/min (by C-G formula based on Cr of 1.05).   Insulin Requirements in the past 24 hours:  CBG < 150, requiring 4 units SSI/24hrs, 11 units/L in TPN. (Per [employer] med list, patient was on SSI and regular insulin in TPN at 15 units/L.)    Nutritional Goals:   RD recs(1/16):  Kcal: 2600-2800, Protein: 130-140 gm, Fluid: >2.5L/day.    TPN @ [facility] = 2624 Kcals, 140g Protein, fluid (125 ml/hr over 24 hrs)  Goal rate inpatient: Clinimix E5/20 at 181ml/hr + Lipids MWF to provide Protein 138gm and 2909 kcal MWF, 2429 Kcal TTSS for weekly avg of 2634  kcal.  Current Nutrition:   NPO (as of 12/20/12)  Clinimix 5/15(without electrolytes) at 179ml/hr.  IVF = NS @ 136ml/hr  Assessment: 44 YOM with complex history including terminal peritoneal carcinomatosis s/p chemo.  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  Pt with LLQ drain, LUQ drain, ileostomy, choly drain, NG tube.  Pt transferred to [facility] on 12/20/11 from [facility] for SBO management, N/V, high NG output.  No further plans for chemotherapy or surgery at this time.  Pt developed hypotension and tachycardia and transferred to ICU on 1/6 with MRSA bacteremia.  ID has recommended line removal, but at this time, the pt elects to continue central lines despite risks.   Patient failed NG clamping trial 1/11, IR consulted for PEG placement.   Failed attempt at stent revision on 1/14 due to progression of tumor infiltration.   Electrolytes - Na remains low but stable. Phos improved on electrolyte-free formula.   LFTs - (1/16) ALT and AlkPhos elevated but improving.  TGs - (1/13) 110  Prealbumin - (1/13) 29  Plan:  Cont Clinimix 5/15(without electrolytes) at 115 ml/hr.  MD management of low Na.  Continue 11 units/L of regular insulin in TNA.  IV fat emulsion 20% at 10 ml/hr on MWF only  due to ongoing shortage.  Standard multivitamins and trace elements on MWF only due to ongoing shortage.  Check Bmet and phosphorous in am.  Charolotte Eke, PharmD, pager [phone]. 01/02/2013,9:01 AM.

## 2013-01-02 NOTE — Progress Notes (Signed)
Progress Note from the Palliative Medicine Team at [facility]  Subjective: 57 yr old male with h/o adenocarcinoma, omental carcinomatosis, s/p extensive abdominal surgeries, multiple fistulas and drains and recent cholecystitis requiring cholecystostomy tube. Patient had jejunal stent, tumor caused obstruction beyond the stent. Now has venting gastrostomy tube. He also has MRSA bacteremia, with picc line and port in place. He is currently on IV vancomycin. ID recommends removing picc line and port a cath and replacing picc in 48 hrs after blood cultures are negative. Discussed in detail with family regarding coe status, CPR, intubation, mechanical ventilation, artificial nutrition and hydration.     Objective: Allergies  Allergen Reactions  . Percocet (Oxycodone-Acetaminophen)     hallucination   Scheduled Meds:   . antiseptic oral rinse  15 mL Mouth Rinse BID  . enoxaparin (LOVENOX) injection  40 mg Subcutaneous Q24H  . insulin aspart  0-9 Units Subcutaneous Q6H  . lip balm  1 application Topical BID  . metoCLOPramide  10 mg Intravenous QID  . pantoprazole  40 mg Intravenous Q12H  . sodium chloride  10-40 mL Intracatheter Q12H  . vancomycin  750 mg Intravenous Q12H   Continuous Infusions:   . sodium chloride 10 mL/hr at 12/24/12 1441  . sodium chloride 100 mL/hr at 01/02/13 0801  . TPN (CLINIMIX) +/- additives 115 mL/hr at 01/01/13 1732   PRN Meds:.bisacodyl, diphenhydrAMINE, fentaNYL, magic mouthwash, menthol-cetylpyridinium, methocarbamol (ROBAXIN) IV, ondansetron, phenol, promethazine, sodium chloride  BP 96/65  Pulse 102  Temp 98.9 F (37.2 C) (Oral)  Resp 18  Ht 6\' 4"  (1.93 m)  Wt 92.534 kg (204 lb)  BMI 24.83 kg/m2  SpO2 100%      Intake/Output Summary (Last 24 hours) at 01/02/13 0849 Last data filed at 01/02/13 0801  Gross per 24 hour  Intake 3141.5 ml  Output   2410 ml  Net  731.5 ml        Labs: CBC    Component Value Date/Time   WBC 9.4  12/29/2012 0355   WBC 5.8 12/06/2011 1405   RBC 3.39* 12/29/2012 0355   RBC 4.32 12/06/2011 1405   HGB 9.4* 12/29/2012 0355   HGB 12.5* 12/06/2011 1405   HCT 29.1* 12/29/2012 0355   HCT 37.4* 12/06/2011 1405   PLT 258 12/29/2012 0355   PLT 228 12/06/2011 1405   MCV 85.8 12/29/2012 0355   MCV 86.6 12/06/2011 1405   MCH 27.7 12/29/2012 0355   MCH 28.9 12/06/2011 1405   MCHC 32.3 12/29/2012 0355   MCHC 33.4 12/06/2011 1405   RDW 16.3* 12/29/2012 0355   RDW 14.9* 12/06/2011 1405   LYMPHSABS 2.1 12/29/2012 0355   LYMPHSABS 1.3 12/06/2011 1405   MONOABS 1.1* 12/29/2012 0355   MONOABS 0.8 12/06/2011 1405   EOSABS 0.3 12/29/2012 0355   EOSABS 0.1 12/06/2011 1405   BASOSABS 0.1 12/29/2012 0355   BASOSABS 0.0 12/06/2011 1405    BMET    Component Value Date/Time   NA 125* 01/02/2013 0619   K 3.9 01/02/2013 0619   CL 97 01/02/2013 0619   CO2 17* 01/02/2013 0619   GLUCOSE 143* 01/02/2013 0619   BUN 52* 01/02/2013 0619   CREATININE 1.05 01/02/2013 0619   CALCIUM 9.6 01/02/2013 0619   GFRNONAA 78* 01/02/2013 0619   GFRAA 90* 01/02/2013 0619    CMP     Component Value Date/Time   NA 125* 01/02/2013 0619   K 3.9 01/02/2013 0619   CL 97 01/02/2013 0619   CO2 17* 01/02/2013  1610   GLUCOSE 143* 01/02/2013 0619   BUN 52* 01/02/2013 0619   CREATININE 1.05 01/02/2013 0619   CALCIUM 9.6 01/02/2013 0619   PROT 7.3 01/01/2013 1851   ALBUMIN 2.6* 01/01/2013 1851   AST 24 01/01/2013 1851   ALT 57* 01/01/2013 1851   ALKPHOS 357* 01/01/2013 1851   BILITOT 1.0 01/01/2013 1851   GFRNONAA 78* 01/02/2013 0619   GFRAA 90* 01/02/2013 0619       1 Assessment and Plan:  Discussed in detail with the patient and his family. They are hopeful that patient will improve, and they will be able to get better treatment for patient's cancer. They also would like to get PICC line removed and reinserted after 48 hours for MRSA bacteremia. Patient is very optimistic about his treatment plan, though he wants to be DO NOT INTUBATE. He  does not want mechanical ventilation. Patient agrees to go home with home hospice  Recommendations:  1. Patient is partial code, wants CPR but no intubation or mechanical ventilation DNI  2. Patient wants the picc line removed and replaced.  3. Patient complains of muscle cramps, called the pharmacy to check electrolytes and replace as needed.    1. Code Status: DNI 2. Symptom Control: fentanyl prn 3. Disposition: Home with hospice   Patient Documents Completed or Given: Document Given Completed  Advanced Directives Pkt    MOST    DNR    Gone from My Sight    Hard Choices      Time In Time Out Total Time Spent with Patient Total Overall Time  5:15 PM   7 PM   75 minutes   120 minutes     Greater than 50%  of this time was spent counseling and coordinating care related to the above assessment and plan.     1

## 2013-01-02 NOTE — Progress Notes (Signed)
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 [name] in [facility] which has left him with multiple drains and fistulas. He also had recent cholecystitis. [facility]. 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 A duodenal stent was placed by GI during second attempt. He however continued to be obstructed. He was transferred to [facility] on 1/3 for evaluation and management. Since hospitalization, he has continued to have copious NG tube drainage. Surgeons and GI do not see any surgical/intervention options. Oncology does not recommend any further chemotherapy at this time. Patient in the interim developed septic shock from MRSA bacteremia/likely line sepsis. Palliative team consulted for goals of care-patient wishes to continue aggressive course.   Assessment/Plan:  Septic Shock secondary to MRSA bacteremia  -? Line sepsis (has PICC line and Port-A-Cath): Less likely from Cholangitis.  - currently on vancomycin. Patient was transferred to ICU for hypotension and tachycardia. - ID following, recommended removal of PICC line & Port-A-Cath, line holiday and prolonged IV antibiotics. Blood cultures were 2 x 2 positive for MRSA. Repeat blood cultures 1/11 negative to date. Now Patient wishes to keep the lines until he feels better and stronger  on TPN. I have told him I will reassess in 2 days and remove the lines. Patient finally agreed on getting hospice involved on Dr [name] 's request. We will continue IV vancomycin for now through the PICC. patient knows that continuing the PICC puts him at high risks  for infection.  Pancytopenia: Leukopenia and thrombocytopenia have resolved. H/h stable.   Hypokalemia and hypomagnesemia:  Pharmacy replacing potassium and magnesium. Improved.   Spasms Has spams of ribs , abdomen and legs. Added robaxin without much relief. Will increase the dose and monitor. Electrolytes wnl. continue prn fentanyl  Hyponatremia  new finding. Likely with hypovolemia. Ordered IV NS. Slowly improved  In am labs . 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 Patient has had multiple surgical procedures . Palliative did goals of care .  Had further GOC on 1/16.  Patient wishes to be  partial code, wants CPR but no intubation or mechanical ventilation DNI    Small bowel obstruction/duodenal obstruction despite stent:  Currently conservative management-n.p.o., and TPN. Failed NGT clamping trial after 5-1/2 hours on 1/11.. Failed attempt at stent revision on 1/14 - progression of tumor infiltration . GI have no further recommendations. Successful gastrostomy tube placed by IR on 1/16  -Cholecystitis status post cholecystostomy drain placed at [facility] - IR performed cholecystostomy tube change on 1/6.   Code Status: partial ( DNI but not DNR)  Family Communication: Discussed with patient   Disposition Plan:  Will continue TPN for next 2 days. If patient feels better and agrees will remove PICC and port A cath on 1/19 , and check blood cx . If cx negative for 48 hrs , then will place another PICC line and d/c home with hospice.  Consultants:  Oncology  [facility]  IR  General surgery.  Critical care medicine.  Palliative care medicine  ID   Procedures:  NG tube  Change of cholecystostomy tube  by IR on 1/6  Diagnostic small bowel enteroscopy 1/14 IR gastrostomy on 1/16   Antibiotics:  IV vancomycin 1/6 >  IV Zosyn 1/6 > 1/8     HPI/Subjective: Feels better after NG removed and Gastrostomy tub placed by IR yesterday. Had meeting with palliative care yesterday. Still has spasms over the abdomen and  Ribs.  Objective: Filed Vitals:   01/02/13 0004 01/02/13 0045 01/02/13 0620 01/02/13 1330  BP: 91/63 95/64 96/65  103/64  Pulse: 120 109 102 99  Temp: 99.1 F (37.3 C) 98.6 F (37 C) 98.9 F (37.2 C) 98.6 F (37 C)  TempSrc: Oral Oral Oral Oral  Resp: 18 32 18 18  Height:      Weight:      SpO2: 95% 100% 100% 100%    Intake/Output Summary (Last 24 hours) at 01/02/13 1411 Last data filed at 01/02/13 1312  Gross per 24 hour  Intake 4334.5 ml  Output   2660 ml  Net 1674.5 ml   Filed Weights   12/23/12 0315 12/24/12 0400 12/30/12 0843  Weight: 93.5 kg (206 lb 2.1 oz) 92.7 kg (204 lb 5.9 oz) 92.534 kg (204 lb)    Exam:  General: No acute distress.  CVS: N s1&s2 no murmur rubs or gallops  Chest: clear to auscultation bilaterally, no wheezing, rales or rhonchi  Abdomen: soft nontender, nondistended,RLQ ostomy. RUQ cholecystostomy drain. Upper midline Eakin's pouch. LLQ drain . New gastrostomy tube in place draining dark bilious material Ext: warm, no edema  Neuro: AAOX 3 no focal neurological deficits   Data Reviewed: Basic Metabolic Panel:  Lab 01/02/13 4098 01/01/13 1851 01/01/13 0255 12/31/12 0435 12/30/12 0505 12/29/12 0355 12/28/12 0320  NA 125* 125* 122* 126* -- 130* --  K 3.9 3.8 3.7 3.5 -- 3.8 --  CL 97 94* 91* 95* -- 95* --  CO2 17* 19 20 20  -- 25 --  GLUCOSE 143* 131* 131* 122* -- 120* --  BUN 52* 50* 48* 44* -- 27* --  CREATININE 1.05 0.93 0.95 0.97 -- 0.83 --  CALCIUM 9.6 10.2 10.4 10.1 -- 10.4 --  MG -- 1.8 1.8 1.2* -- 1.6 1.5  PHOS 3.9 4.8* 5.0* 3.9 4.4 -- --   Liver Function Tests:  Lab 01/01/13 1851 01/01/13 0255 12/29/12 0355 12/27/12 0430  AST 24 29 27 24   ALT 57* 67* 49 52  ALKPHOS 357* 385* 484* 519*  BILITOT 1.0 1.0 0.9 1.0  PROT 7.3 7.4 7.2 6.2  ALBUMIN 2.6* 2.7*  2.7* 2.4*   No results found for this basename: LIPASE:5,AMYLASE:5 in the last 168 hours No results found for this basename: AMMONIA:5 in the last 168 hours CBC:  Lab 12/29/12 0355 12/27/12 0430  WBC 9.4 6.5  NEUTROABS 5.8 --  HGB 9.4* 8.6*  HCT 29.1* 26.3*  MCV 85.8 84.6  PLT 258 205   Cardiac Enzymes: No results found for this basename: CKTOTAL:5,CKMB:5,CKMBINDEX:5,TROPONINI:5 in the last 168 hours BNP (last 3 results)  Basename 12/22/12 1700  PROBNP 33.8   CBG:  Lab 01/02/13 1206 01/02/13 0624 01/02/13 0002 01/01/13 1202 01/01/13 0601  GLUCAP 163* 142* 137* 159* 151*    Recent Results (from the past 240 hour(s))  CULTURE, BLOOD (ROUTINE X 2)     Status: Normal   Collection Time   12/27/12  4:30 AM      Component Value Range Status Comment   Specimen Description BLOOD RIGHT PICC   Final    Special Requests BOTTLES DRAWN AEROBIC AND ANAEROBIC 5CC  Final    Culture  Setup Time 12/27/2012 13:21   Final    Culture NO GROWTH 5 DAYS   Final    Report Status 01/02/2013 FINAL   Final      Studies: Ir Gastrostomy Tube Mod Sed  01/01/2013  *RADIOLOGY REPORT*  PULL THROUGH GASTROSTOMY TUBE PLACEMENT UNDER FLUOROSCOPIC GUIDANCE  Date: 01/01/2013  Clinical History: 57 year old male with terminal peritoneal carcinomatosis and jejunal obstruction.  He requires a diverting gastrostomy tube for palliation.  Procedures Performed:  1.  Fluoroscopically guided placement of percutaneous pull-through gastrostomy tube.  Interventional Radiologist:  Sterling Big, MD  Sedation: Moderate (conscious) sedation was used.  To the mg Versed, 100 mcg Fentanyl were administered intravenously.  The patient's vital signs were monitored continuously by radiology nursing throughout the procedure.  Sedation Time: 30 minutes  Fluoroscopy time: 4.8 minutes  Contrast volume: 10 ml Omnipaque-300 administered into the stomach  PROCEDURE/FINDINGS:   Informed consent was obtained from the patient following  explanation of the procedure, risks, benefits and alternatives. The patient understands, agrees and consents for the procedure. All questions were addressed. A time out was performed.  Maximal barrier sterile technique utilized including caps, mask, sterile gowns, sterile gloves, large sterile drape, hand hygiene, and chlorhexadine skin prep.  An angled catheter was advanced over a wire under fluoroscopic guidance through the nose, down the esophagus and into the body of the stomach.  The stomach was then insufflated with several 100 ml of air.  Fluoroscopy confirmed location of the gastric bubble, as well as inferior displacement of the barium stained colon.  Under direct fluoroscopic guidance, a single T-tack was placed, and the anterior gastric wall drawn up against the anterior abdominal wall. Percutaneous access was then obtained into the mid gastric body with an 18 gauge sheath needle.  Aspiration of air, and injection of contrast material under fluoroscopy confirmed needle placement.  An Amplatz wire was advanced in the gastric body.  The tract was then serially dilated to 12-French.  A 12 [place] Wills-Oglesby decompressive gastrostomy tube was then advanced over the wire and positioned within the gastric body.  A gentle hand injection of contrast material confirmed intragastric location.  This single T tack was cut.  The tube was secured to the skin with both Prolene suture.  There is no immediate complication, the patient tolerated the procedure well.  The patient will be observed for several hours with the newly placed tube on low wall suction to evaluate for any post procedure complication.  IMPRESSION:  Successful placement of a 37 French decompressive gastrostomy tube. The tube is designed for decompression only and is not designed for administration of nutrition or medications.  Connect to low wall suction as needed.  Signed,  Sterling Big, MD Vascular & Interventional Radiologist [facility]  Radiology   Original Report Authenticated By: Malachy Moan, M.D.     Scheduled Meds:   . antiseptic oral rinse  15 mL Mouth Rinse BID  . enoxaparin (LOVENOX) injection  40 mg Subcutaneous Q24H  . insulin aspart  0-9 Units Subcutaneous Q6H  . lip balm  1 application Topical BID  . metoCLOPramide  10 mg Intravenous QID  . pantoprazole  40 mg Intravenous Q12H  . sodium chloride  10-40 mL Intracatheter Q12H  . vancomycin  750 mg Intravenous Q12H   Continuous Infusions:   . sodium chloride 100 mL/hr at 01/02/13 0801  . TPN (CLINIMIX) +/- additives     And  . fat emulsion    .  TPN (CLINIMIX) +/- additives 115 mL/hr at 01/01/13 1732      Time spent: 25 minutes    Eddie North  [facility] Pager [phone]. If 8PM-8AM, please contact night-coverage at [email or web address], password [facility] 01/02/2013, 2:11 PM  LOS: 14 days

## 2013-01-02 NOTE — Progress Notes (Signed)
3 Days Post-Op  Subjective: Feeling okay. Nausea well. Minimal pain at gastric tube insertion site.   Objective: Vital signs in last 24 hours: Temp:  [97.6 F (36.4 C)-99.1 F (37.3 C)] 98.9 F (37.2 C) (01/17 0620) Pulse Rate:  [100-120] 102  (01/17 0620) Resp:  [13-35] 18  (01/17 0620) BP: (91-130)/(62-87) 96/65 mmHg (01/17 0620) SpO2:  [95 %-100 %] 100 % (01/17 0620) Last BM Date: 01/01/13  Intake/Output from previous day: 01/16 0701 - 01/17 0700 In: 3141.5 [I.V.:1234; TPN:1907.5] Out: 2055 [Urine:1055; Emesis/NG output:1000] Intake/Output this shift: Total I/O In: -  Out: 355 [Drains:355]  PE: awake in bed, appears comfortable.  Gastric tube intact with insertion site clean and dry. Tube connected to wall suction with green bilious fluid in wall vac. Abdomen soft, all other dressings, wounds appear clean and dry.   Lab Results:  No results found for this basename: WBC:2,HGB:2,HCT:2,PLT:2 in the last 72 hours BMET  [facility] 01/02/13 0619 01/01/13 1851  NA 125* 125*  K 3.9 3.8  CL 97 94*  CO2 17* 19  GLUCOSE 143* 131*  BUN 52* 50*  CREATININE 1.05 0.93  CALCIUM 9.6 10.2   PT/INR No results found for this basename: LABPROT:2,INR:2 in the last 72 hours ABG No results found for this basename: PHART:2,PCO2:2,PO2:2,HCO3:2 in the last 72 hours  Studies/Results: Ir Gastrostomy Tube Mod Sed  01/01/2013  *RADIOLOGY REPORT*  PULL THROUGH GASTROSTOMY TUBE PLACEMENT UNDER FLUOROSCOPIC GUIDANCE  Date: 01/01/2013  Clinical History: 57 year old male with terminal peritoneal carcinomatosis and jejunal obstruction.  He requires a diverting gastrostomy tube for palliation.  Procedures Performed:  1.  Fluoroscopically guided placement of percutaneous pull-through gastrostomy tube.  Interventional Radiologist:  Sterling Big, MD  Sedation: Moderate (conscious) sedation was used.  To the mg Versed, 100 mcg Fentanyl were administered intravenously.  The patient's vital signs were  monitored continuously by radiology nursing throughout the procedure.  Sedation Time: 30 minutes  Fluoroscopy time: 4.8 minutes  Contrast volume: 10 ml Omnipaque-300 administered into the stomach  PROCEDURE/FINDINGS:   Informed consent was obtained from the patient following explanation of the procedure, risks, benefits and alternatives. The patient understands, agrees and consents for the procedure. All questions were addressed. A time out was performed.  Maximal barrier sterile technique utilized including caps, mask, sterile gowns, sterile gloves, large sterile drape, hand hygiene, and chlorhexadine skin prep.  An angled catheter was advanced over a wire under fluoroscopic guidance through the nose, down the esophagus and into the body of the stomach.  The stomach was then insufflated with several 100 ml of air.  Fluoroscopy confirmed location of the gastric bubble, as well as inferior displacement of the barium stained colon.  Under direct fluoroscopic guidance, a single T-tack was placed, and the anterior gastric wall drawn up against the anterior abdominal wall. Percutaneous access was then obtained into the mid gastric body with an 18 gauge sheath needle.  Aspiration of air, and injection of contrast material under fluoroscopy confirmed needle placement.  An Amplatz wire was advanced in the gastric body.  The tract was then serially dilated to 12-French.  A 12 [place] Wills-Oglesby decompressive gastrostomy tube was then advanced over the wire and positioned within the gastric body.  A gentle hand injection of contrast material confirmed intragastric location.  This single T tack was cut.  The tube was secured to the skin with both Prolene suture.  There is no immediate complication, the patient tolerated the procedure well.  The patient will be  observed for several hours with the newly placed tube on low wall suction to evaluate for any post procedure complication.  IMPRESSION:  Successful placement of a 70  French decompressive gastrostomy tube. The tube is designed for decompression only and is not designed for administration of nutrition or medications.  Connect to low wall suction as needed.  Signed,  Sterling Big, MD Vascular & Interventional Radiologist [facility]   Original Report Authenticated By: Malachy Moan, M.D.     Anti-infectives: Anti-infectives     Start     Dose/Rate Route Frequency Ordered Stop   01/01/13 1200   vancomycin (VANCOCIN) 750 mg in sodium chloride 0.9 % 150 mL IVPB        750 mg 150 mL/hr over 60 Minutes Intravenous Every 12 hours 01/01/13 0417     01/01/13 0800   ceFAZolin (ANCEF) IVPB 1 g/50 mL premix        1 g 100 mL/hr over 30 Minutes Intravenous  Once 12/31/12 1657 01/01/13 0919   12/29/12 1000   vancomycin (VANCOCIN) IVPB 1000 mg/200 mL premix  Status:  Discontinued        1,000 mg 200 mL/hr over 60 Minutes Intravenous Every 12 hours 12/28/12 1849 01/01/13 0406   12/24/12 1000   vancomycin (VANCOCIN) IVPB 1000 mg/200 mL premix  Status:  Discontinued        1,000 mg 200 mL/hr over 60 Minutes Intravenous Every 8 hours 12/24/12 0836 12/28/12 1840   12/23/12 2200   vancomycin (VANCOCIN) IVPB 1000 mg/200 mL premix  Status:  Discontinued        1,000 mg 200 mL/hr over 60 Minutes Intravenous Every 12 hours 12/23/12 1040 12/24/12 0836   12/22/12 1600   vancomycin (VANCOCIN) IVPB 1000 mg/200 mL premix  Status:  Discontinued        1,000 mg 200 mL/hr over 60 Minutes Intravenous Every 8 hours 12/22/12 1539 12/23/12 1040   12/22/12 1600   piperacillin-tazobactam (ZOSYN) IVPB 3.375 g  Status:  Discontinued        3.375 g 12.5 mL/hr over 240 Minutes Intravenous Every 8 hours 12/22/12 1539 12/24/12 1441          Assessment/Plan: s/p Procedure(s) (LRB) with comments: ENTEROSCOPY (N/A) - With possible stent placement; please have duodenal stent available DUODENAL STENT PLACEMENT (N/A)  GE juction obstruction s/p gastric tube placement  for decompression use only.  Patient tolerating well. Call for any concerns regarding gastric tube.     LOS: 14 days    Kody Brandl D 01/02/2013

## 2013-01-02 NOTE — Progress Notes (Addendum)
Late Entry: Follow-up: Met with wife, [name] this evening to discuss plans for home with hospice when medically ready; per notes patient not ready to discharge at this time;  discussed hospice services at home; philosophy and team approach to care; discussed hospice team supports the care that the family provides; [name] voiced understanding although stated she is feeling overwhelmed with the level of physical care [name] will need but wants to try to make this work, she and her soon are willing to work with the hospice team to learn whatever will be necessary  - Assured [name] that the hospice team would work to help her, her husband and family once at home to address needs/concerns and be sure [name] was comfortable and symptoms were managed.  She voiced she is beginning to prepare the home and DME needs were discussed- will confirm needs on Monday. [name] voiced that she and husband are pleased with plan as outlined by Dr Valora Piccolo note 01/02/13 related to TPN/ PICC/PAC removal, etc.  [name] had several questions:1) timing of TPN once home (24 hrs vs cyclic) 2) whether Lovenox will need to be continued; 3) if the PEG tube will always need to be to suction or can it be tried to gravity drainage etc- encouraged her to make a list of these questions to discuss this with doctors [name] informed she wants to request FMLA leave and will need doctors to assist with paperwork - encouraged her to bring in paperwork and speak with attending physician.  Will continue to follow and plan to speak again on Monday with [name] to continue planning for discharge.  Valente David, RN 01/02/2013, 7:49 PM [facility] Palliative Medicine Team RN Liaison [phone]

## 2013-01-03 LAB — BASIC METABOLIC PANEL
BUN: 46 mg/dL — ABNORMAL HIGH (ref 6–23)
CO2: 17 mEq/L — ABNORMAL LOW (ref 19–32)
Chloride: 99 mEq/L (ref 96–112)
GFR calc non Af Amer: >90 mL/min (ref >90)
Glucose, Bld: 125 mg/dL — ABNORMAL HIGH (ref 70–99)
Potassium: 3.4 mEq/L — ABNORMAL LOW (ref 3.5–5.1)
Sodium: 126 mEq/L — ABNORMAL LOW (ref 135–145)

## 2013-01-03 LAB — GLUCOSE, CAPILLARY

## 2013-01-03 MED ORDER — INSULIN REGULAR HUMAN 100 UNIT/ML IJ SOLN
INTRAVENOUS | Status: AC
  Administered 2013-01-03: 18:00:00 via INTRAVENOUS
  Filled 2013-01-03: qty 2760

## 2013-01-03 NOTE — Progress Notes (Signed)
ANTIBIOTIC CONSULT NOTE - FOLLOW UP  Pharmacy Consult for Vancomycin Indication: MRSA Bacteremia   Allergies  Allergen Reactions  . Percocet (Oxycodone-Acetaminophen)     hallucination    Patient Measurements: Height: 6\' 4"  (193 cm) Weight: 204 lb (92.534 kg) IBW/kg (Calculated) : 86.8   Vital Signs: Temp: 98 F (36.7 C) (01/18 0545) Temp src: Oral (01/18 0545) BP: 102/66 mmHg (01/18 0545) Pulse Rate: 96  (01/18 0545) Intake/Output from previous day: 01/17 0701 - 01/18 0700 In: 5239 [P.O.:80; I.V.:2327; IV Piggyback:60; TPN:2772] Out: 2755 [Urine:1100; Drains:1655] Intake/Output from this shift:    Labs:  Basename 01/03/13 0500 01/02/13 0619 01/01/13 1851  WBC -- -- --  HGB -- -- --  PLT -- -- --  LABCREA -- -- --  CREATININE 0.94 1.05 0.93   Estimated Creatinine Clearance: 107.7 ml/min (by C-G formula based on Cr of 0.94).  Basename 01/01/13 0230  VANCOTROUGH 25.3*  VANCOPEAK --  Drue Dun --  GENTTROUGH --  GENTPEAK --  GENTRANDOM --  TOBRATROUGH --  TOBRAPEAK --  TOBRARND --  AMIKACINPEAK --  AMIKACINTROU --  AMIKACIN --     Microbiology: Recent Results (from the past 720 hour(s))  CULTURE, BLOOD (ROUTINE X 2)     Status: Normal   Collection Time   12/22/12 12:55 PM      Component Value Range Status Comment   Specimen Description BLOOD LEFT HAND   Final    Special Requests BOTTLES DRAWN AEROBIC AND ANAEROBIC [facility]   Final    Culture  Setup Time 12/22/2012 22:48   Final    Culture     Final    Value: METHICILLIN RESISTANT STAPHYLOCOCCUS AUREUS     Note: RIFAMPIN AND GENTAMICIN SHOULD NOT BE USED AS SINGLE DRUGS FOR TREATMENT OF STAPH INFECTIONS. CRITICAL RESULT CALLED TO, READ BACK BY AND VERIFIED WITH: JENNIFER FUQUAY @ 1258 12/24/12 BY KRAWS     Note: Gram Stain Report Called to,Read Back By and Verified With: Burnell Blanks @ 1254 12/23/12 BY KRAWS   Report Status 12/25/2012 FINAL   Final    Organism ID, Bacteria METHICILLIN RESISTANT STAPHYLOCOCCUS  AUREUS   Final   CULTURE, BLOOD (ROUTINE X 2)     Status: Normal   Collection Time   12/22/12  1:05 PM      Component Value Range Status Comment   Specimen Description BLOOD LEFT HAND   Final    Special Requests BOTTLES DRAWN AEROBIC AND ANAEROBIC [facility]   Final    Culture  Setup Time 12/22/2012 22:48   Final    Culture     Final    Value: STAPHYLOCOCCUS AUREUS     Note: SUSCEPTIBILITIES PERFORMED ON PREVIOUS CULTURE WITHIN THE LAST 5 DAYS.     Note: Gram Stain Report Called to,Read Back By and Verified With: KIM Richardson Dopp @ 1254 12/23/12 BY KRAWS   Report Status 12/25/2012 FINAL   Final   MRSA PCR SCREENING     Status: Abnormal   Collection Time   12/22/12  5:42 PM      Component Value Range Status Comment   MRSA by PCR POSITIVE (*) NEGATIVE Final   CULTURE, BLOOD (ROUTINE X 2)     Status: Normal   Collection Time   12/27/12  4:30 AM      Component Value Range Status Comment   Specimen Description BLOOD RIGHT PICC   Final    Special Requests BOTTLES DRAWN AEROBIC AND ANAEROBIC 5CC   Final    Culture  Setup Time 12/27/2012 13:21   Final    Culture NO GROWTH 5 DAYS   Final    Report Status 01/02/2013 FINAL   Final     Anti-infectives     Start     Dose/Rate Route Frequency Ordered Stop   01/01/13 1200   vancomycin (VANCOCIN) 750 mg in sodium chloride 0.9 % 150 mL IVPB        750 mg 150 mL/hr over 60 Minutes Intravenous Every 12 hours 01/01/13 0417     01/01/13 0800   ceFAZolin (ANCEF) IVPB 1 g/50 mL premix        1 g 100 mL/hr over 30 Minutes Intravenous  Once 12/31/12 1657 01/01/13 0919   12/29/12 1000   vancomycin (VANCOCIN) IVPB 1000 mg/200 mL premix  Status:  Discontinued        1,000 mg 200 mL/hr over 60 Minutes Intravenous Every 12 hours 12/28/12 1849 01/01/13 0406   12/24/12 1000   vancomycin (VANCOCIN) IVPB 1000 mg/200 mL premix  Status:  Discontinued        1,000 mg 200 mL/hr over 60 Minutes Intravenous Every 8 hours 12/24/12 0836 12/28/12 1840   12/23/12 2200   vancomycin  (VANCOCIN) IVPB 1000 mg/200 mL premix  Status:  Discontinued        1,000 mg 200 mL/hr over 60 Minutes Intravenous Every 12 hours 12/23/12 1040 12/24/12 0836   12/22/12 1600   vancomycin (VANCOCIN) IVPB 1000 mg/200 mL premix  Status:  Discontinued        1,000 mg 200 mL/hr over 60 Minutes Intravenous Every 8 hours 12/22/12 1539 12/23/12 1040   12/22/12 1600   piperacillin-tazobactam (ZOSYN) IVPB 3.375 g  Status:  Discontinued        3.375 g 12.5 mL/hr over 240 Minutes Intravenous Every 8 hours 12/22/12 1539 12/24/12 1441          Assessment:  57 YO M with h/o terminal peritoneal carcinomatosis s/p chemo. Pt developed hypotension and tachycardia 1/6 with possible sepsis (cholangitis after biliary drain manipulation) now with +MRSA  Day #13/42 vancomycin for MRSA bacteremia   SCr still WNL, afebrile  ID recommendations noted  Pt is now agreeable to removal of PICC and PAC, possibly 1/19 if he feels better  Goal of Therapy:  Vancomycin trough level 15-20 mcg/ml Eradication of infection  Plan:   Continue vancomycin 750mg  IV q12h for now  Recheck a vanc trough tonight to assess dose change from 1/16   Loralee Pacas, PharmD, BCPS Pager: [phone] 01/03/2013 8:17 AM

## 2013-01-03 NOTE — Progress Notes (Signed)
Subjective: Patient seen and examined, no new complaints. Plan is to remove picc line in am and replace in 48 hrs before going home with hospice.  Filed Vitals:   01/03/13 0813  BP:   Pulse:   Temp:   Resp: 24    Chest: Clear Bilaterally Heart : S1S2 RRR Abdomen: Soft, nontender Ext : No edema Neuro: Alert, oriented x 3  A/P Principal Problem:  *Peritoneal carcinomatosis Active Problems:  ANEMIA  SBO (small bowel obstruction)  Hypokalemia  Nausea and vomiting in adult  Anemia of chronic disease  Colo-enteric fistula  Acute cholecystitis s/p perc draianage [id number]  Fistula of small intestine  Shock  Sepsis  Septic shock due to Staphylococcus aureus  Pancytopenia  Hypomagnesemia  Hyponatremia  Abdominal spasms  Picc line to be removed in am and replace in 48 hrs. Dispo; Plan for home hospice next week.    Meredeth Ide [facility] [phone]

## 2013-01-03 NOTE — Progress Notes (Signed)
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 [name] in [facility] which has left him with multiple drains and fistulas. He also had recent cholecystitis. [facility]. 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 A duodenal stent was placed by GI during second attempt. He however continued to be obstructed. He was transferred to [facility] on 1/3 for evaluation and management. Since hospitalization, he has continued to have copious NG tube drainage. Surgeons and GI do not see any surgical/intervention options. Oncology does not recommend any further chemotherapy at this time. Patient in the interim developed septic shock from MRSA bacteremia/likely line sepsis. Palliative team consulted for goals of care-patient wishes to continue aggressive course.    Assessment/Plan:   MRSA bacteremia  -? Line sepsis (has PICC line and Port-A-Cath): Less likely from Cholangitis.  - currently on vancomycin. Patient was transferred to ICU for hypotension and tachycardia. - ID  recommended removal of PICC line & Port-A-Cath, line holiday , check repeat blood culture and prolonged IV antibiotics. Blood cultures were 2 x 2 positive for MRSA. Repeat blood cultures 1/11 negative to date. Now Patient wishes to keep the lines until he feels better and stronger on TPN. Patient finally agreed on getting hospice involved on Dr [name] 's request. We will continue IV vancomycin for now through the PICC. patient knows that continuing the PICC puts him at high risks for infection.  -Will remove PICC line and Port-A-Cath tomorrow and  get repeat blood culture. If repeat  culture negative in 48 hours we placed another PICC line and plan for discharge.  Pancytopenia: Leukopenia and thrombocytopenia have resolved. H/h stable.   Hypokalemia and hypomagnesemia:  Pharmacy replacing potassium and magnesium. Improved.   Spasms  Has spams of ribs , abdomen and legs. Improved with increased dose of Robaxin and low-dose morphine added. Hyponatremia  new finding. Likely with hypovolemia. Improving slowly with IV hydration .  Peritoneal carcinomatosis  diagnosed in December 2012 s/p extensive debulking surgery at [facility] twice (1st time January 2013 second time 08/2012) . Patient has had multiple surgical procedures . Palliative consult appreciated Patient wishes to be partial code, wants CPR but no intubation or mechanical ventilation DNI   Small bowel obstruction/duodenal obstruction despite stent:  Currently conservative management-n.p.o., and TPN. Failed NGT clamping trial after 5-1/2 hours on 1/11.. Failed attempt at stent revision on 1/14 - progression of tumor infiltration . GI have no further recommendations.  Successful gastrostomy tube placed by IR on 1/16   -Cholecystitis status post cholecystostomy drain placed at [facility] - IR performed cholecystostomy tube change on 1/6.   Code Status: partial ( DNI but not DNR)  Family Communication: Discussed with patient and daughter at bedside Disposition Plan:  Will continue TPN for today. Remove PICC line and Port-A-Cath tomorrow and check repeat blood culture. If cx negative for 48 hrs , then will place another PICC line and d/c home with hospice.    Consultants:  Oncology  [facility]  IR  General surgery.  Critical care medicine.  Palliative care medicine  ID Procedures:  NG tube  Change of cholecystostomy tube by IR on 1/6  Diagnostic small bowel enteroscopy 1/14  IR gastrostomy  on 1/16 Antibiotics:  IV vancomycin 1/6 >  IV Zosyn 1/6 > 1/8     HPI/Subjective: Informs his cramping all abdomen and ribs to be much better today. Has a lot of questions regarding possible future improvement in symptoms, surgical and other treatment options.  Objective: Filed Vitals:   01/02/13 2100 01/03/13 0545 01/03/13 0813 01/03/13 1430  BP: 100/66 102/66  109/59  Pulse: 98 96  83  Temp: 97.8 F (36.6 C) 98 F (36.7 C)  97.1 F (36.2 C)  TempSrc: Oral Oral  Oral  Resp: 20 18 24 18   Height:      Weight:      SpO2: 100% 100%  100%    Intake/Output Summary (Last 24 hours) at 01/03/13 1601 Last data filed at 01/03/13 0600  Gross per 24 hour  Intake   4046 ml  Output   1750 ml  Net   2296 ml   Filed Weights   12/23/12 0315 12/24/12 0400 12/30/12 0843  Weight: 93.5 kg (206 lb 2.1 oz) 92.7 kg (204 lb 5.9 oz) 92.534 kg (204 lb)    Exam:  General: No acute distress.  CVS: N s1&s2 no murmur rubs or gallops  Chest: clear to auscultation bilaterally, no wheezing, rales or rhonchi  Abdomen: soft nontender, nondistended,RLQ ostomy. RUQ cholecystostomy drain. Upper midline Eakin's pouch. LLQ drain . New gastrostomy tube with significant dark bilious output,  Ext: warm, no edema  Neuro: AAOX 3 no focal neurological deficits   Data Reviewed: Basic Metabolic Panel:  Lab 01/03/13 1610 01/02/13 0619 01/01/13 1851 01/01/13 0255 12/31/12 0435 12/29/12 0355 12/28/12 0320  NA 126* 125* 125* 122* 126* -- --  K 3.4* 3.9 3.8 3.7 3.5 -- --  CL 99 97 94* 91* 95* -- --  CO2 17* 17* 19 20 20  -- --  GLUCOSE 125* 143* 131* 131* 122* -- --  BUN 46* 52* 50* 48* 44* -- --  CREATININE 0.94 1.05 0.93 0.95 0.97 -- --  CALCIUM 9.6 9.6 10.2 10.4 10.1 -- --  MG -- -- 1.8 1.8 1.2* 1.6 1.5  PHOS 3.1 3.9 4.8* 5.0* 3.9 -- --   Liver Function Tests:  Lab 01/01/13 1851 01/01/13 0255 12/29/12 0355  AST 24 29 27   ALT 57* 67* 49  ALKPHOS 357* 385* 484*  BILITOT 1.0 1.0 0.9  PROT 7.3 7.4 7.2  ALBUMIN 2.6* 2.7* 2.7*   No results found for this basename:  LIPASE:5,AMYLASE:5 in the last 168 hours No results found for this basename: AMMONIA:5 in the last 168 hours CBC:  Lab 12/29/12 0355  WBC 9.4  NEUTROABS 5.8  HGB 9.4*  HCT 29.1*  MCV 85.8  PLT 258   Cardiac Enzymes: No results found for this basename: CKTOTAL:5,CKMB:5,CKMBINDEX:5,TROPONINI:5 in the last 168 hours BNP (last 3 results)  Basename 12/22/12 1700  PROBNP 33.8   CBG:  Lab 01/03/13 1230 01/03/13 0551 01/02/13 2337 01/02/13 1735 01/02/13 1206  GLUCAP 126* 125* 137* 146* 163*    Recent Results (from the past 240 hour(s))  CULTURE, BLOOD (ROUTINE X 2)     Status: Normal   Collection Time   12/27/12  4:30 AM      Component Value Range Status Comment   Specimen Description BLOOD RIGHT PICC   Final    Special Requests BOTTLES DRAWN AEROBIC AND ANAEROBIC 5CC   Final    Culture  Setup Time 12/27/2012 13:21   Final    Culture NO GROWTH 5 DAYS   Final  Report Status 01/02/2013 FINAL   Final      Studies: No results found.  Scheduled Meds:   . antiseptic oral rinse  15 mL Mouth Rinse BID  . enoxaparin (LOVENOX) injection  40 mg Subcutaneous Q24H  . insulin aspart  0-9 Units Subcutaneous Q6H  . lip balm  1 application Topical BID  . metoCLOPramide  10 mg Intravenous QID  . pantoprazole  40 mg Intravenous Q12H  . sodium chloride  10-40 mL Intracatheter Q12H  . vancomycin  750 mg Intravenous Q12H   Continuous Infusions:   . sodium chloride 100 mL/hr at 01/03/13 1200  . TPN (CLINIMIX) +/- additives 115 mL/hr at 01/03/13 0600   And  . fat emulsion 250 mL (01/03/13 0600)  . TPN (CLINIMIX) +/- additives        Time spent: 25 minutes    Maliah Pyles  [facility] Pager [phone]*. If 8PM-8AM, please contact night-coverage at [email or web address], password [facility] 01/03/2013, 4:01 PM  LOS: 15 days

## 2013-01-03 NOTE — Progress Notes (Signed)
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 Percocet (Oxycodone-Acetaminophen)     hallucination   Patient Measurements: Height: 6\' 4"  (193 cm) Weight: 204 lb (92.534 kg) IBW/kg (Calculated) : 86.8   Vital Signs: Temp: 98 F (36.7 C) (01/18 0545) Temp src: Oral (01/18 0545) BP: 102/66 mmHg (01/18 0545) Pulse Rate: 96  (01/18 0545) Intake/Output from previous day: 01/17 0701 - 01/18 0700 In: 5239 [P.O.:80; I.V.:2327; IV Piggyback:60; TPN:2772] Out: 2755 [Urine:1100; Drains:1655]  Labs: No results found for this basename: WBC:3,HGB:3,HCT:3,PLT:3,APTT:3,INR:3 in the last 72 hours   Basename 01/03/13 0500 01/02/13 0619 01/01/13 1851 01/01/13 0255  NA 126* 125* 125* --  K 3.4* 3.9 3.8 --  CL 99 97 94* --  CO2 17* 17* 19 --  GLUCOSE 125* 143* 131* --  BUN 46* 52* 50* --  CREATININE 0.94 1.05 0.93 --  LABCREA -- -- -- --  CREAT24HRUR -- -- -- --  CALCIUM 9.6 9.6 10.2 --  MG -- -- 1.8 1.8  PHOS 3.1 3.9 4.8* --  PROT -- -- 7.3 7.4  ALBUMIN -- -- 2.6* 2.7*  AST -- -- 24 29  ALT -- -- 57* 67*  ALKPHOS -- -- 357* 385*  BILITOT -- -- 1.0 1.0  BILIDIR -- -- -- --  IBILI -- -- -- --  PREALBUMIN -- -- -- --  TRIG -- -- -- --  CHOLHDL -- -- -- --  CHOL -- -- -- --   Estimated Creatinine Clearance: 107.7 ml/min (by C-G formula based on Cr of 0.94).   Insulin Requirements in the past 24 hours:  Most CBG < 150, requiring 4 units SSI/24hrs, 11 units/L in TPN. (Per [employer], advertising med list, patient was on SSI and regular insulin in TPN at 15 units/L.)    Nutritional Goals:   RD recs(1/16):  Kcal: 2600-2800, Protein: 130-140 gm, Fluid: >2.5L/day.    TPN @ [facility] = 2624 Kcals, 140g Protein, fluid (125 ml/hr over 24 hrs)  Goal rate inpatient: Clinimix E5/20 at 174ml/hr + Lipids MWF to provide Protein 138gm and 2909 kcal MWF, 2429 Kcal TTSS for weekly  avg of 2634 kcal.  Current Nutrition:   NPO (as of 12/20/12)  Clinimix 5/15(without electrolytes) at 156ml/hr.  IVF = NS @ 124ml/hr  Assessment: 28 YOM with complex history including terminal peritoneal carcinomatosis s/p chemo.  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  Pt with LLQ drain, LUQ drain, ileostomy, choly drain, NG tube.  Pt transferred to [facility] on 12/20/11 from [facility] for SBO management, N/V, high NG output.  No further plans for chemotherapy or surgery at this time.  Pt developed hypotension and tachycardia and transferred to ICU on 1/6 with MRSA bacteremia.  ID has recommended line removal, but at this time, the pt elects to continue central lines despite risks.   Patient failed NG clamping trial 1/11, IR consulted for PEG placement.   Failed attempt at stent revision on 1/14 due to progression of tumor infiltration.   Electrolytes - Na remains low but stable. K slightly low Phos improved on electrolyte-free formula.   LFTs - (1/16) ALT and AlkPhos elevated but improving.  TGs - (1/13) 110  Prealbumin - (1/13) 29  Noted plan per Dr. Valora Piccolo note 1/17 to continue TNA for another day, then consider removing PICC and PAC on 1/19 if patient agrees.  Plan:  Change formula  to Clinimix E5/15 (containes electrolytes) at 115 ml/hr since K/Na low and Phos now WNL  MD management of low Na.  Continue 11 units/L of regular insulin in TNA.  IV fat emulsion 20% at 10 ml/hr on MWF only due to ongoing shortage.  Standard multivitamins and trace elements on MWF only due to ongoing shortage.  Check Bmet and phosphorous in am.  Loralee Pacas, PharmD, BCPS Pager: [phone] 01/03/2013,8:00 AM.

## 2013-01-04 ENCOUNTER — Inpatient Hospital Stay (HOSPITAL_COMMUNITY): Payer: 59

## 2013-01-04 DIAGNOSIS — B9562 Methicillin resistant Staphylococcus aureus infection as the cause of diseases classified elsewhere: Secondary | ICD-10-CM

## 2013-01-04 DIAGNOSIS — R7881 Bacteremia: Secondary | ICD-10-CM

## 2013-01-04 DIAGNOSIS — A4902 Methicillin resistant Staphylococcus aureus infection, unspecified site: Secondary | ICD-10-CM

## 2013-01-04 HISTORY — DX: Methicillin resistant Staphylococcus aureus infection as the cause of diseases classified elsewhere: B95.62

## 2013-01-04 HISTORY — DX: Bacteremia: R78.81

## 2013-01-04 LAB — APTT: aPTT: 49 seconds — ABNORMAL HIGH (ref 24–37)

## 2013-01-04 LAB — PROTIME-INR: Prothrombin Time: 14.7 seconds (ref 11.6–15.2)

## 2013-01-04 LAB — BASIC METABOLIC PANEL
BUN: 35 mg/dL — ABNORMAL HIGH (ref 6–23)
Creatinine, Ser: 0.83 mg/dL (ref 0.50–1.35)
GFR calc Af Amer: >90 mL/min (ref >90)
GFR calc non Af Amer: >90 mL/min (ref >90)
Glucose, Bld: 115 mg/dL — ABNORMAL HIGH (ref 70–99)
Potassium: 3.7 mEq/L (ref 3.5–5.1)

## 2013-01-04 LAB — GLUCOSE, CAPILLARY
Glucose-Capillary: 109 mg/dL — ABNORMAL HIGH (ref 70–99)
Glucose-Capillary: 132 mg/dL — ABNORMAL HIGH (ref 70–99)

## 2013-01-04 LAB — CBC
Platelets: 274 10*3/uL (ref 150–400)
RBC: 3.17 MIL/uL — ABNORMAL LOW (ref 4.22–5.81)
WBC: 8.1 10*3/uL (ref 4.0–10.5)

## 2013-01-04 LAB — VANCOMYCIN, TROUGH: Vancomycin Tr: 17 ug/mL (ref 10.0–20.0)

## 2013-01-04 MED ORDER — DEXTROSE-NACL 5-0.9 % IV SOLN
INTRAVENOUS | Status: DC
Start: 2013-01-04 — End: 2013-01-09
  Administered 2013-01-04 – 2013-01-06 (×2): via INTRAVENOUS
  Administered 2013-01-06: 1000 mL via INTRAVENOUS
  Administered 2013-01-07: 07:00:00 via INTRAVENOUS

## 2013-01-04 NOTE — Progress Notes (Signed)
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 [name] in [facility] which has left him with multiple drains and fistulas. He also had recent cholecystitis. [facility]. 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 A duodenal stent was placed by GI during second attempt. He however continued to be obstructed. He was transferred to [facility] on 1/3 for evaluation and management. Since hospitalization, he has continued to have copious NG tube drainage. Surgeons and GI do not see any surgical/intervention options. Oncology does not recommend any further chemotherapy at this time. Patient in the interim developed septic shock from MRSA bacteremia/likely line sepsis. Palliative team consulted for goals of care-patient wishes to continue aggressive course.    Assessment/Plan:  MRSA bacteremia  -? Line sepsis (has PICC line and Port-A-Cath): Less likely from Cholangitis.  - currently on vancomycin. Patient was transferred to ICU for hypotension and tachycardia. - ID recommended removal of PICC line & Port-A-Cath, line holiday , check repeat blood culture and prolonged IV antibiotics. Blood cultures were 2 x 2 positive for MRSA. Repeat blood cultures 1/11 negative to date. Now Patient wishes to keep the lines until he feels better and stronger on TPN.  Patient finally agreed on getting hospice involved on Dr [name] 's request. patient knows that continuing the PICC puts him at high risks for infection.  -Will remove PICC line and Port-A-Cath and get repeat blood culture. If repeat culture negative in 48 hours we  placed another PICC line and plan for discharge.   Pancytopenia: Leukopenia and thrombocytopenia have resolved. H/h stable.   Hypokalemia and hypomagnesemia:  Pharmacy replacing potassium and magnesium. Improved.   Spasms  Has spams of ribs , abdomen and legs. Improved with increased dose of Robaxin and low-dose morphine   Hyponatremia  new finding. Likely with hypovolemia. Improving slowly with IV hydration  .  Peritoneal carcinomatosis  diagnosed in December 2012 s/p extensive debulking surgery at [facility] twice (1st time January 2013 second time 08/2012) . Patient has had multiple surgical procedures . Palliative consult appreciated  Patient wishes to be partial code, wants CPR but no intubation or mechanical ventilation DNI   Small bowel obstruction/duodenal obstruction despite stent:  Currently conservative management-n.p.o., and TPN. Failed NGT clamping trial after 5-1/2 hours on 1/11.. Failed attempt at stent revision on 1/14 - progression of tumor infiltration . GI have no further recommendations.  Successful gastrostomy tube placed by IR on 1/16   -Cholecystitis status post cholecystostomy drain placed at [facility] - IR performed cholecystostomy tube change on 1/6.   Code Status: partial ( DNI but not DNR)   Family Communication: Discussed with patient and daughter at bedside   Disposition Plan:   Remove PICC line and Port-A-Cath and check repeat blood culture. If cx negative for 48 hrs , then will place another PICC line and d/c home with hospice.   Consultants:  Oncology  [facility]  IR  General surgery.  Critical care medicine.  Palliative care medicine  ID   Procedures:  NG tube  Change of cholecystostomy tube by IR on 1/6  Diagnostic small bowel enteroscopy 1/14  IR gastrostomy on 1/16   Antibiotics:  IV vancomycin 1/6 >  IV Zosyn 1/6 > 1/8    HPI/Subjective: No overnight issues. Spasms much relieved now.  Objective: Filed Vitals:   01/03/13  0813 01/03/13 1430 01/03/13 2320 01/04/13 0626  BP:  109/59 104/64 104/65  Pulse:  83 90 83  Temp:  97.1 F (36.2 C) 98.9 F (37.2 C) 98.2 F (36.8 C)  TempSrc:  Oral Oral Oral  Resp: 24 18 20 18   Height:      Weight:      SpO2:  100% 100% 100%    Intake/Output Summary (Last 24 hours) at 01/04/13 0943 Last data filed at 01/04/13 1610  Gross per 24 hour  Intake 5270.16 ml  Output   2475 ml  Net 2795.16 ml   Filed Weights   12/23/12 0315 12/24/12 0400 12/30/12 0843  Weight: 93.5 kg (206 lb 2.1 oz) 92.7 kg (204 lb 5.9 oz) 92.534 kg (204 lb)    Exam:  General: No acute distress.  CVS: N s1&s2 no murmur rubs or gallops  Chest: clear to auscultation bilaterally, no wheezing, rales or rhonchi  Abdomen: soft nontender, nondistended,RLQ ostomy. RUQ cholecystostomy drain. Upper midline Eakin's pouch. LLQ drain . New gastrostomy tube with dark bilious output,  Ext: warm, no edema  Neuro: AAOX 3 no focal neurological deficits   Data Reviewed: Basic Metabolic Panel:  Lab 01/04/13 9604 01/03/13 0500 01/02/13 0619 01/01/13 1851 01/01/13 0255 12/31/12 0435 12/29/12 0355  NA 131* 126* 125* 125* 122* -- --  K 3.7 3.4* 3.9 3.8 3.7 -- --  CL 104 99 97 94* 91* -- --  CO2 17* 17* 17* 19 20 -- --  GLUCOSE 115* 125* 143* 131* 131* -- --  BUN 35* 46* 52* 50* 48* -- --  CREATININE 0.83 0.94 1.05 0.93 0.95 -- --  CALCIUM 9.6 9.6 9.6 10.2 10.4 -- --  MG -- -- -- 1.8 1.8 1.2* 1.6  PHOS 4.5 3.1 3.9 4.8* 5.0* -- --   Liver Function Tests:  Lab 01/01/13 1851 01/01/13 0255 12/29/12 0355  AST 24 29 27   ALT 57* 67* 49  ALKPHOS 357* 385* 484*  BILITOT 1.0 1.0 0.9  PROT 7.3 7.4 7.2  ALBUMIN 2.6* 2.7* 2.7*   No results found for this basename: LIPASE:5,AMYLASE:5 in the last 168 hours No results found for this basename: AMMONIA:5 in the last 168 hours CBC:  Lab 12/29/12 0355  WBC 9.4  NEUTROABS 5.8  HGB 9.4*  HCT 29.1*  MCV 85.8  PLT 258   Cardiac Enzymes: No results found for this  basename: CKTOTAL:5,CKMB:5,CKMBINDEX:5,TROPONINI:5 in the last 168 hours BNP (last 3 results)  Basename 12/22/12 1700  PROBNP 33.8   CBG:  Lab 01/04/13 0629 01/03/13 2329 01/03/13 1810 01/03/13 1230 01/03/13 0551  GLUCAP 109* 110* 111* 126* 125*    Recent Results (from the past 240 hour(s))  CULTURE, BLOOD (ROUTINE X 2)     Status: Normal   Collection Time   12/27/12  4:30 AM      Component Value Range Status Comment   Specimen Description BLOOD RIGHT PICC   Final    Special Requests BOTTLES DRAWN AEROBIC AND ANAEROBIC 5CC   Final    Culture  Setup Time 12/27/2012 13:21   Final    Culture NO GROWTH 5 DAYS   Final    Report Status 01/02/2013 FINAL   Final      Studies: No results found.  Scheduled Meds:   . antiseptic oral rinse  15 mL Mouth Rinse BID  . enoxaparin (  LOVENOX) injection  40 mg Subcutaneous Q24H  . insulin aspart  0-9 Units Subcutaneous Q6H  . lip balm  1 application Topical BID  . metoCLOPramide  10 mg Intravenous QID  . pantoprazole  40 mg Intravenous Q12H  . sodium chloride  10-40 mL Intracatheter Q12H  . vancomycin  750 mg Intravenous Q12H   Continuous Infusions:   . sodium chloride 100 mL/hr at 01/03/13 1623  . TPN (CLINIMIX) +/- additives 115 mL/hr at 01/03/13 1736      Time spent: 25 MINUTES    Eddie North  [facility] Pager [phone]. If 8PM-8AM, please contact night-coverage at [email or web address], password [facility] 01/04/2013, 9:43 AM  LOS: 16 days

## 2013-01-04 NOTE — Procedures (Signed)
Interventional Radiology Procedure Note  Procedure: Successful removal of surgically placed (outside hospital) left chest portacatheter.  Tip sent for culture. Complications: None Recommendations: - Follow Cx results - Wound not grossly infected and closed primarily  Signed,  Sterling Big, MD Vascular & Interventional Radiologist [facility] Radiology

## 2013-01-04 NOTE — Progress Notes (Addendum)
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 Percocet (Oxycodone-Acetaminophen)     hallucination   Patient Measurements: Height: 6\' 4"  (193 cm) Weight: 204 lb (92.534 kg) IBW/kg (Calculated) : 86.8   Vital Signs: Temp: 98.2 F (36.8 C) (01/19 0626) Temp src: Oral (01/19 0626) BP: 104/65 mmHg (01/19 0626) Pulse Rate: 83  (01/19 0626) Intake/Output from previous day: 01/18 0701 - 01/19 0700 In: 5270.2 [I.V.:2444.3; TPN:2825.8] Out: 2475 [Urine:750; Drains:1725]  Labs: No results found for this basename: WBC:3,HGB:3,HCT:3,PLT:3,APTT:3,INR:3 in the last 72 hours   Basename 01/04/13 0640 01/03/13 0500 01/02/13 0619 01/01/13 1851  NA 131* 126* 125* --  K 3.7 3.4* 3.9 --  CL 104 99 97 --  CO2 17* 17* 17* --  GLUCOSE 115* 125* 143* --  BUN 35* 46* 52* --  CREATININE 0.83 0.94 1.05 --  LABCREA -- -- -- --  CREAT24HRUR -- -- -- --  CALCIUM 9.6 9.6 9.6 --  MG -- -- -- 1.8  PHOS 4.5 3.1 3.9 --  PROT -- -- -- 7.3  ALBUMIN -- -- -- 2.6*  AST -- -- -- 24  ALT -- -- -- 57*  ALKPHOS -- -- -- 357*  BILITOT -- -- -- 1.0  BILIDIR -- -- -- --  IBILI -- -- -- --  PREALBUMIN -- -- -- --  TRIG -- -- -- --  CHOLHDL -- -- -- --  CHOL -- -- -- --   Estimated Creatinine Clearance: 122 ml/min (by C-G formula based on Cr of 0.83).   Insulin Requirements in the past 24 hours:  CBG range: 109-132, required 1 units SSI/24hrs, 11 units/L in TPN. (Per Copywriter, advertising med list, patient was on SSI and regular insulin in TPN at 15 units/L.)    Nutritional Goals:   RD recs(1/16):  Kcal: 2600-2800, Protein: 130-140 gm, Fluid: >2.5L/day.    TPN @ [facility] = 2624 Kcals, 140g Protein, fluid (125 ml/hr over 24 hrs)  Goal rate inpatient: Clinimix E5/20 at 164ml/hr + Lipids MWF to provide Protein 138gm and 2909 kcal MWF, 2429 Kcal TTSS for weekly avg of 2634  kcal.  Current Nutrition:   NPO (as of 12/20/12)  Clinimix E5/15 (electrolytes added back 1/18) at 124ml/hr.  IVF = NS @ 150ml/hr  Assessment:  38 YOM with complex history including terminal peritoneal carcinomatosis s/p chemo.  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  Pt with LLQ drain, LUQ drain, ileostomy, choly drain, NG tube.  Pt transferred to [facility] on 12/20/11 from [facility] for SBO management, N/V, high NG output.  No further plans for chemotherapy or surgery at this time.  Pt developed hypotension and tachycardia and transferred to ICU on 1/6 with MRSA bacteremia.    Patient failed NG clamping trial 1/11, IR consulted for PEG placement.   Failed attempt at stent revision on 1/14 due to progression of tumor infiltration.   Electrolytes - Na remains low. K slightly low. Phos improved on electrolyte-free formula.   LFTs - (1/16) ALT and AlkPhos elevated but improving.  TGs - (1/13) 110  Prealbumin - (1/13) 29   Plan:  Since plan is to remove both PICC and PAC today, will not order TNA.  Pharmacy will continue to follow along for plan of care.   Loralee Pacas, PharmD, BCPS Pager: [phone] 01/04/2013,10:13 AM.

## 2013-01-04 NOTE — Progress Notes (Signed)
ANTIBIOTIC CONSULT NOTE - FOLLOW UP  Pharmacy Consult for Vancomycin Indication: MRSA Bacteremia   Allergies  Allergen Reactions  . Percocet (Oxycodone-Acetaminophen)     hallucination    Patient Measurements: Height: 6\' 4"  (193 cm) Weight: 204 lb (92.534 kg) IBW/kg (Calculated) : 86.8   Vital Signs: Temp: 98.9 F (37.2 C) (01/18 2320) Temp src: Oral (01/18 2320) BP: 104/64 mmHg (01/18 2320) Pulse Rate: 90  (01/18 2320) Intake/Output from previous day: 01/18 0701 - 01/19 0700 In: 2210 [I.V.:1021; TPN:1189] Out: 1250 [Urine:125; Drains:1125] Intake/Output from this shift: Total I/O In: -  Out: 550 [Urine:125; Drains:425]  Labs:  [facility] 01/03/13 0500 01/02/13 0619 01/01/13 1851  WBC -- -- --  HGB -- -- --  PLT -- -- --  LABCREA -- -- --  CREATININE 0.94 1.05 0.93   Estimated Creatinine Clearance: 107.7 ml/min (by C-G formula based on Cr of 0.94).  Basename 01/03/13 2340  VANCOTROUGH 17.0  VANCOPEAK --  VANCORANDOM --  GENTTROUGH --  GENTPEAK --  GENTRANDOM --  TOBRATROUGH --  TOBRAPEAK --  TOBRARND --  AMIKACINPEAK --  AMIKACINTROU --  AMIKACIN --     Microbiology: Recent Results (from the past 720 hour(s))  CULTURE, BLOOD (ROUTINE X 2)     Status: Normal   Collection Time   12/22/12 12:55 PM      Component Value Range Status Comment   Specimen Description BLOOD LEFT HAND   Final    Special Requests BOTTLES DRAWN AEROBIC AND ANAEROBIC [facility]   Final    Culture  Setup Time 12/22/2012 22:48   Final    Culture     Final    Value: METHICILLIN RESISTANT STAPHYLOCOCCUS AUREUS     Note: RIFAMPIN AND GENTAMICIN SHOULD NOT BE USED AS SINGLE DRUGS FOR TREATMENT OF STAPH INFECTIONS. CRITICAL RESULT CALLED TO, READ BACK BY AND VERIFIED WITH: JENNIFER FUQUAY @ 1258 12/24/12 BY KRAWS     Note: Gram Stain Report Called to,Read Back By and Verified With: Burnell Blanks @ 1254 12/23/12 BY KRAWS   Report Status 12/25/2012 FINAL   Final    Organism ID, Bacteria METHICILLIN  RESISTANT STAPHYLOCOCCUS AUREUS   Final   CULTURE, BLOOD (ROUTINE X 2)     Status: Normal   Collection Time   12/22/12  1:05 PM      Component Value Range Status Comment   Specimen Description BLOOD LEFT HAND   Final    Special Requests BOTTLES DRAWN AEROBIC AND ANAEROBIC [facility]   Final    Culture  Setup Time 12/22/2012 22:48   Final    Culture     Final    Value: STAPHYLOCOCCUS AUREUS     Note: SUSCEPTIBILITIES PERFORMED ON PREVIOUS CULTURE WITHIN THE LAST 5 DAYS.     Note: Gram Stain Report Called to,Read Back By and Verified With: KIM Richardson Dopp @ 1254 12/23/12 BY KRAWS   Report Status 12/25/2012 FINAL   Final   MRSA PCR SCREENING     Status: Abnormal   Collection Time   12/22/12  5:42 PM      Component Value Range Status Comment   MRSA by PCR POSITIVE (*) NEGATIVE Final   CULTURE, BLOOD (ROUTINE X 2)     Status: Normal   Collection Time   12/27/12  4:30 AM      Component Value Range Status Comment   Specimen Description BLOOD RIGHT PICC   Final    Special Requests BOTTLES DRAWN AEROBIC AND ANAEROBIC 5CC   Final  Culture  Setup Time 12/27/2012 13:21   Final    Culture NO GROWTH 5 DAYS   Final    Report Status 01/02/2013 FINAL   Final     Anti-infectives     Start     Dose/Rate Route Frequency Ordered Stop   01/01/13 1200   vancomycin (VANCOCIN) 750 mg in sodium chloride 0.9 % 150 mL IVPB        750 mg 150 mL/hr over 60 Minutes Intravenous Every 12 hours 01/01/13 0417     01/01/13 0800   ceFAZolin (ANCEF) IVPB 1 g/50 mL premix        1 g 100 mL/hr over 30 Minutes Intravenous  Once 12/31/12 1657 01/01/13 0919   12/29/12 1000   vancomycin (VANCOCIN) IVPB 1000 mg/200 mL premix  Status:  Discontinued        1,000 mg 200 mL/hr over 60 Minutes Intravenous Every 12 hours 12/28/12 1849 01/01/13 0406   12/24/12 1000   vancomycin (VANCOCIN) IVPB 1000 mg/200 mL premix  Status:  Discontinued        1,000 mg 200 mL/hr over 60 Minutes Intravenous Every 8 hours 12/24/12 0836 12/28/12 1840    12/23/12 2200   vancomycin (VANCOCIN) IVPB 1000 mg/200 mL premix  Status:  Discontinued        1,000 mg 200 mL/hr over 60 Minutes Intravenous Every 12 hours 12/23/12 1040 12/24/12 0836   12/22/12 1600   vancomycin (VANCOCIN) IVPB 1000 mg/200 mL premix  Status:  Discontinued        1,000 mg 200 mL/hr over 60 Minutes Intravenous Every 8 hours 12/22/12 1539 12/23/12 1040   12/22/12 1600   piperacillin-tazobactam (ZOSYN) IVPB 3.375 g  Status:  Discontinued        3.375 g 12.5 mL/hr over 240 Minutes Intravenous Every 8 hours 12/22/12 1539 12/24/12 1441          Assessment:  57 YO M with h/o terminal peritoneal carcinomatosis s/p chemo. Pt developed hypotension and tachycardia 1/6 with possible sepsis (cholangitis after biliary drain manipulation) now with +MRSA  Day #13/42 vancomycin for MRSA bacteremia   SCr still WNL, afebrile  ID recommendations noted  Pt is now agreeable to removal of PICC and PAC, possibly 1/19 if he feels better  VT = 17 mcg/ml ( at goal )  Goal of Therapy:  Vancomycin trough level 15-20 mcg/ml Eradication of infection  Plan:   Continue vancomycin 750mg  IV q12h.  F/U SCr/levels as needed.   Lorenza Evangelist 01/04/2013 3:16 AM

## 2013-01-05 LAB — GLUCOSE, CAPILLARY
Glucose-Capillary: 100 mg/dL — ABNORMAL HIGH (ref 70–99)
Glucose-Capillary: 82 mg/dL (ref 70–99)
Glucose-Capillary: 86 mg/dL (ref 70–99)
Glucose-Capillary: 96 mg/dL (ref 70–99)

## 2013-01-05 MED ORDER — VITAMINS A & D EX OINT
TOPICAL_OINTMENT | CUTANEOUS | Status: AC
  Administered 2013-01-05: 12:00:00
  Filled 2013-01-05: qty 10

## 2013-01-05 MED ORDER — MORPHINE SULFATE (CONCENTRATE) 20 MG/ML PO SOLN
5.0000 mg | ORAL | Status: DC | PRN
  Administered 2013-01-06: 5 mg via ORAL
  Filled 2013-01-05: qty 1

## 2013-01-05 NOTE — Progress Notes (Signed)
Agree with Ms. Guenther's assessment and plan. Carl E. Gessner, MD, FACG   

## 2013-01-05 NOTE — Progress Notes (Signed)
[facility]    Called to address question of patient taking PO. Patient has peritoneal carcinomatosis / SBO from tumor infiltration. 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 UGI series 12/25/12 showed very little contrast entering the stent. Small bowel enteroscopy 12/30/12 revealing obstruction beyond the stent from extrinsic compression or tumor infiltration. No additional endoscopic measures could be taken. Patient ultimately got a decompressive G tube placed by IR. We were asked to make recommendations about diet. Would place Gtube drainage bag to gravity and allow him clear liquids. Solids would clog up gtube and patient would not tolerate them given obstruction. Full liquids may or may not be tolerated at this point. They probably won't pass obstruction and may not drain out Gtube without clogging it.   Hospice is visiting now. Patient still reluctant to "throw in the towel". He is drinking Gatorade and Cranberry juice. Patient agreeable to puting Gtube to gravity now to make sure he tolerates it. Patient can also have a suction pump at home to use as needed.    Willette Cluster, NP-c

## 2013-01-05 NOTE — Progress Notes (Addendum)
Cm spoke with patient with spouse at bedside concerning discharge. Pt to discharge home with [facility]. CM informed pt to contact Cm if any further assistance needed. Will continue to follow.  Roxy Manns Yovani Cogburn,RN,BSN [phone]

## 2013-01-05 NOTE — Progress Notes (Signed)
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 [name] in [facility] which has left him with multiple drains and fistulas. He also had recent cholecystitis. [facility]. 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 A duodenal stent was placed by GI during second attempt. He however continued to be obstructed. He was transferred to [facility] on 1/3 for evaluation and management. Since hospitalization, he has continued to have copious NG tube drainage. Surgeons and GI do not see any surgical/intervention options. Oncology does not recommend any further chemotherapy at this time. Patient in the interim developed septic shock from MRSA bacteremia/likely line sepsis. Palliative team consulted for goals of care-patient wishes to continue aggressive course.    Assessment/Plan:  MRSA bacteremia  -? Line sepsis (has PICC line and Port-A-Cath): Less likely from Cholangitis.  - currently on vancomycin. Patient was transferred to ICU for hypotension and tachycardia. - ID recommended removal of PICC line & Port-A-Cath, line holiday , check repeat blood culture and prolonged IV antibiotics. Blood cultures were 2 x 2 positive for MRSA. Repeat blood cultures 1/11 negative to date. Now Patient wishes to keep the lines until he feels better and stronger on TPN.  Patient finally agreed on getting hospice involved on Dr [name] 's request. patient knows that continuing the PICC puts him at high risks for infection.  -Will remove PICC line and Port-A-Cath and get repeat blood culture. If repeat culture negative in 48 hours we  placed another PICC line and plan for discharge.   Pancytopenia: Leukopenia and thrombocytopenia have resolved. H/h stable.    Hypokalemia and hypomagnesemia:  Pharmacy replacing potassium and magnesium. Improved.   Spasms  Has spams of ribs , abdomen and legs. Improved with increased dose of Robaxin and low-dose morphine   Hyponatremia  new finding. Likely with hypovolemia. Improving slowly with IV hydration  .  Peritoneal carcinomatosis  diagnosed in December 2012 s/p extensive debulking surgery at [facility] twice (1st time January 2013 second time 08/2012) . Patient has had multiple surgical procedures . Palliative consult appreciated   Patient wishes to be partial code, wants CPR but no intubation or mechanical ventilation DNI   Small bowel obstruction/duodenal obstruction despite stent:  Currently conservative management-n.p.o., and TPN. Failed NGT clamping trial after 5-1/2 hours on 1/11.. Failed attempt at stent revision on 1/14 - progression of tumor infiltration . GI have no further recommendations.  Successful gastrostomy tube placed by IR on 1/16 . Was attached to wall suction now placed to gravity as per patient request. Hospice will arrange for suction pump at home as needed  -Cholecystitis status post cholecystostomy drain placed at [facility] - IR performed cholecystostomy tube change on 1/6.   Code Status: partial ( DNI but not DNR)  Family Communication: Discussed with patient  Disposition Plan:  Patient is very hopeful about getting better and repeatedly requests on getting opinion is at different centers including [facility] and [facility] for possible transfer. I discussed this periodically with him that chemotherapy is not an option at this time and his surgeon Dr. [name] at [facility] also did not suggest the need for any surgery at this time. He was seen by his oncologist [name] again  today who recommended that he is not a candidate for any further treatment or clinical  studies given his advanced disease, multiple complications and poor functional status. He wishes to seek consultation at different facility once being discharged from here. At present he agrees to go home with hospice followup. Patient's PICC line and Port-A-Cath was removed on 1/19 and and ordered repeat blood culture. If repeat culture negative in 48 hours I will order for a PICC line placement and resume his TPN that is currently being held. -Patient will then be discharged home on TPN and IV vancomycin with home  hospice followup.  Consultants:  Oncology  [facility]  IR  General surgery.  Critical care medicine.  Palliative care medicine  ID Procedures:  NG tube  Change of cholecystostomy tube by IR on 1/6  Diagnostic small bowel enteroscopy 1/14  IR gastrostomy on 1/16 Antibiotics:  IV vancomycin 1/6 >  IV Zosyn 1/6 > 1/8    HPI/Subjective:  No overnight issues. Spasms much relieved now. Patient telling nurse and case manager that he will be transferred to [facility].     Objective: Filed Vitals:   01/04/13 1445 01/05/13 0015 01/05/13 0619 01/05/13 1439  BP: 119/75 110/71 110/74 99/75  Pulse: 86 83 93 81  Temp: 98 F (36.7 C) 98.1 F (36.7 C) 98.3 F (36.8 C) 97.6 F (36.4 C)  TempSrc: Oral Oral Oral Oral  Resp: 16 16 16 16   Height:      Weight:      SpO2: 100% 100% 100% 100%    Intake/Output Summary (Last 24 hours) at 01/05/13 1756 Last data filed at 01/05/13 1644  Gross per 24 hour  Intake   1345 ml  Output   3050 ml  Net  -1705 ml   Filed Weights   12/23/12 0315 12/24/12 0400 12/30/12 0843  Weight: 93.5 kg (206 lb 2.1 oz) 92.7 kg (204 lb 5.9 oz) 92.534 kg (204 lb)    Exam:  General: No acute distress.  CVS: N s1&s2 no murmur rubs or gallops  Chest: clear to auscultation bilaterally, no wheezing, rales or rhonchi  Abdomen: soft nontender, nondistended,RLQ ostomy. RUQ cholecystostomy drain. Upper midline Eakin's pouch. LLQ drain . New gastrostomy tube  with dark bilious output,  Ext: warm, no edema  Neuro: AAOX 3 no focal neurological deficits   Data Reviewed: Basic Metabolic Panel:  Lab 01/04/13 1610 01/03/13 0500 01/02/13 0619 01/01/13 1851 01/01/13 0255 12/31/12 0435  NA 131* 126* 125* 125* 122* --  K 3.7 3.4* 3.9 3.8 3.7 --  CL 104 99 97 94* 91* --  CO2 17* 17* 17* 19 20 --  GLUCOSE 115* 125* 143* 131* 131* --  BUN 35* 46* 52* 50* 48* --  CREATININE 0.83 0.94 1.05 0.93 0.95 --  CALCIUM 9.6 9.6 9.6 10.2 10.4 --  MG -- -- -- 1.8 1.8 1.2*  PHOS 4.5 3.1 3.9 4.8* 5.0* --   Liver Function Tests:  Lab 01/01/13 1851 01/01/13 0255  AST 24 29  ALT 57* 67*  ALKPHOS 357* 385*  BILITOT 1.0 1.0  PROT 7.3 7.4  ALBUMIN 2.6* 2.7*   No results found for this basename: LIPASE:5,AMYLASE:5 in the last 168 hours No results found for this basename: AMMONIA:5 in the last 168 hours CBC:  Lab 01/04/13 1025  WBC 8.1  NEUTROABS --  HGB 8.8*  HCT 26.5*  MCV 83.6  PLT 274   Cardiac Enzymes: No results found for this basename: CKTOTAL:5,CKMB:5,CKMBINDEX:5,TROPONINI:5 in the last 168  hours BNP (last 3 results)  Basename 12/22/12 1700  PROBNP 33.8   CBG:  Lab 01/05/13 1137 01/05/13 0623 01/05/13 0042 01/04/13 1756 01/04/13 1205  GLUCAP 96 100* 103* 100* 132*    Recent Results (from the past 240 hour(s))  CULTURE, BLOOD (ROUTINE X 2)     Status: Normal   Collection Time   12/27/12  4:30 AM      Component Value Range Status Comment   Specimen Description BLOOD RIGHT PICC   Final    Special Requests BOTTLES DRAWN AEROBIC AND ANAEROBIC 5CC   Final    Culture  Setup Time 12/27/2012 13:21   Final    Culture NO GROWTH 5 DAYS   Final    Report Status 01/02/2013 FINAL   Final   CATH TIP CULTURE     Status: Normal (Preliminary result)   Collection Time   01/04/13  2:18 PM      Component Value Range Status Comment   Specimen Description CATH TIP   Final    Special Requests NONE   Final    Culture NO GROWTH 1 DAY   Final    Report  Status PENDING   Incomplete      Studies: No results found.  Scheduled Meds:   . antiseptic oral rinse  15 mL Mouth Rinse BID  . enoxaparin (LOVENOX) injection  40 mg Subcutaneous Q24H  . insulin aspart  0-9 Units Subcutaneous Q6H  . lip balm  1 application Topical BID  . metoCLOPramide  10 mg Intravenous QID  . pantoprazole  40 mg Intravenous Q12H  . sodium chloride  10-40 mL Intracatheter Q12H  . vancomycin  750 mg Intravenous Q12H   Continuous Infusions:   . sodium chloride 100 mL/hr at 01/04/13 1147  . dextrose 5 % and 0.9% NaCl 75 mL/hr at 01/04/13 2004      Time spent: 25 minutes    Eddie North  [facility] Pager [phone] If 8PM-8AM, please contact night-coverage at [email or web address], password [facility] 01/05/2013, 5:56 PM  LOS: 17 days

## 2013-01-05 NOTE — Care Management (Signed)
Chart reviewed  [name] [phone]

## 2013-01-05 NOTE — Progress Notes (Addendum)
Late entry Follow-up: call received this morning from patient's wife regarding DME needs;she had been visiting with patient planning to leave the hospital shortly- This RN met with patient this afternoon at approximately 2:30pm- wife had since left; spoke with patient at length about hospice services at home; hospice philosophy of care; Mr [name] shared he has gone back and forth with choices/options and stated "if anyone would say that they could offer any further treatment I would take it..right now I know I want to go home with hospice services because in the long run this will be the best for me"  At this time, per discussion with patient and Dr Gonzella Lex, plan is to possibly place new PICC line Tues or Wed and plan for discharge Thursday; per GI note patient to be allowed clear liquid diet and trial of PEG to gravity drain using intermittent suction prn Per discussion with wife and patient the following DME will need to be delivered to the home prior to d/c: 1) complete package D which includes fully electric hospital bed with AP&P mattress, over-bed table  * Note bed will need an extender as pt ht=6'4"; add a light weight wheel chair; walker and intermittent Gomco suction to package    [facility] will need to contact wife Theodosia Blender [phone] for delivery arrangements Discussed with [facility]- will need to plan for DME delivery on Wednesday, as discharge is possibly on Thursday -at this time, per discussion with wife and patient, plan is for ambulance transport when stable for discharge Will continue to follow   Valente David, RN 01/05/2013, 6:36 PM [facility] Palliative Medicine Team RN Liaison [phone]

## 2013-01-05 NOTE — Progress Notes (Addendum)
Subjective: Patient seen and examined, no pain. Has been on IV fentanyl and IV morphine. Also wife has questions regarding diet at home. Patient does not want to get off wall suction, and start on gravity as he is scared it will worsen the nausea, vomiting. Filed Vitals:   01/05/13 0619  BP: 110/74  Pulse: 93  Temp: 98.3 F (36.8 C)  Resp: 16    Chest: Clear Bilaterally Heart : S1S2 RRR Abdomen: Soft, nontender Ext : No edema Neuro: Alert, oriented x 3  A/P Principal Problem:  *Peritoneal carcinomatosis Active Problems:  ANEMIA  SBO (small bowel obstruction)  Hypokalemia  Nausea and vomiting in adult  Anemia of chronic disease  Colo-enteric fistula  Acute cholecystitis s/p perc draianage [id number]  Fistula of small intestine  Shock  Sepsis  Septic shock due to Staphylococcus aureus  Pancytopenia  Hypomagnesemia  Hyponatremia  Abdominal spasms  MRSA bacteremia  Will d/c the IV morphine and fentanyl and start Roxanol 5 mg SL q 4 hr prn. Have discussed with GI, they will see the patient and make recommendation regarding diet at home and continuous wall suction vs gravity for the peg tube.  Patient and his wife asked about treatments at [facility] for the cancer, explained to them that they can get second opinion after discharged from the hospital.   Meredeth Ide [facility] Pager- [phone]

## 2013-01-05 NOTE — Progress Notes (Signed)
Events noted. Patient is stable clinically.  I discussed again with him in my opinion the best course of action: once iv access can be placed, home TNA under hospice care.  He is not a candidate for any clinical studies as he is bed ridden with multiple complications.

## 2013-01-06 LAB — PREALBUMIN: Prealbumin: 17.1 mg/dL — ABNORMAL LOW (ref 17.0–34.0)

## 2013-01-06 LAB — COMPREHENSIVE METABOLIC PANEL
AST: 30 U/L (ref 0–37)
Alkaline Phosphatase: 390 U/L — ABNORMAL HIGH (ref 39–117)
BUN: 26 mg/dL — ABNORMAL HIGH (ref 6–23)
CO2: 19 mEq/L (ref 19–32)
Chloride: 109 mEq/L (ref 96–112)
Creatinine, Ser: 0.99 mg/dL (ref 0.50–1.35)
GFR calc non Af Amer: 90 mL/min — ABNORMAL LOW (ref >90)
Total Bilirubin: 1 mg/dL (ref 0.3–1.2)

## 2013-01-06 LAB — DIFFERENTIAL
Basophils Absolute: 0 10*3/uL (ref 0.0–0.1)
Basophils Relative: 0 % (ref 0–1)
Eosinophils Absolute: 0.1 10*3/uL (ref 0.0–0.7)
Monocytes Absolute: 0.7 10*3/uL (ref 0.1–1.0)
Neutro Abs: 4.4 10*3/uL (ref 1.7–7.7)

## 2013-01-06 LAB — CBC
HCT: 26.5 % — ABNORMAL LOW (ref 39.0–52.0)
MCH: 28.2 pg (ref 26.0–34.0)
MCHC: 32.8 g/dL (ref 30.0–36.0)
RDW: 16.1 % — ABNORMAL HIGH (ref 11.5–15.5)

## 2013-01-06 LAB — PHOSPHORUS: Phosphorus: 5 mg/dL — ABNORMAL HIGH (ref 2.3–4.6)

## 2013-01-06 LAB — GLUCOSE, CAPILLARY: Glucose-Capillary: 84 mg/dL (ref 70–99)

## 2013-01-06 LAB — MAGNESIUM: Magnesium: 1.6 mg/dL (ref 1.5–2.5)

## 2013-01-06 MED ORDER — MORPHINE SULFATE (CONCENTRATE) 10 MG /0.5 ML PO SOLN
5.0000 mg | ORAL | Status: DC | PRN
  Administered 2013-01-06 – 2013-01-08 (×2): 5 mg via ORAL
  Filled 2013-01-06 (×2): qty 0.5

## 2013-01-06 NOTE — Progress Notes (Signed)
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 [name] in [facility] which has left him with multiple drains and fistulas. He also had recent cholecystitis. [facility]. 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 A duodenal stent was placed by GI during second attempt. He however continued to be obstructed. He was transferred to [facility] on 1/3 for evaluation and management. Since hospitalization, he has continued to have copious NG tube drainage. Surgeons and GI do not see any surgical/intervention options. Oncology does not recommend any further chemotherapy at this time. Patient in the interim developed septic shock from MRSA bacteremia/likely line sepsis. Palliative team consulted for goals of care-patient wishes to continue aggressive course.    Assessment/Plan:  MRSA bacteremia  -? Line sepsis (has PICC line and Port-A-Cath): Less likely from Cholangitis.  - currently on vancomycin. Patient was transferred to ICU for hypotension and tachycardia. - ID recommended removal of PICC line & Port-A-Cath, line holiday , check repeat blood culture and prolonged IV antibiotics. Blood cultures were 2 x 2 positive for MRSA. Repeat blood cultures 1/11 negative..  Patient finally agreed on getting hospice involved on Dr [name] 's request. -Will removeDd PICC line and Port-A-Cath on 1/19 ( finally after patient agreed) and  repeated blood culture. Blood cx negative at 48hrs . Will order PICC line placement.  Pancytopenia: Leukopenia and thrombocytopenia have resolved. H/h stable.   Hypokalemia and  hypomagnesemia:  Pharmacy replacing potassium and magnesium. Improved.   Spasms   spams of ribs , abdomen and legs. Improved with increased dose of Robaxin and low-dose morphine   Hyponatremia   Likely with hypovolemia. Improved with IV hydration  .  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 Patient has had multiple surgical procedures . Palliative consult appreciated . Patient wishes to be partial code, wants CPR but no intubation or mechanical ventilation DNI   Small bowel obstruction/duodenal obstruction despite stent:  Currently conservative management-n.p.o., and TPN. Failed NGT clamping trial after 5-1/2 hours on 1/11.. Failed attempt at stent revision on 1/14 - progression of tumor infiltration . GI have no further recommendations.  Successful gastrostomy tube placed by IR on 1/16 . Was attached to wall suction , was uncomfortable on placing  to gravity.  Hospice will arrange for suction pump at home as needed   -Cholecystitis status post cholecystostomy drain placed at [facility]  - IR performed cholecystostomy tube change on 1/6.   Code Status: partial ( DNI but not DNR)  Family Communication: Discussed with patient   Disposition Plan:  Patient is very hopeful about getting better and repeatedly requests on getting opinion is at different centers including [place] and [facility] for possible transfer. I discussed this periodically with him that chemotherapy is not an option at this time and his surgeon Dr. [name] at [facility] also did not suggest the need for any surgery at this time. He was seen by his oncologist [name] again on 1/20 who recommended that he is not a candidate for any further treatment or clinical studies given his advanced disease, multiple complications and poor functional status. He wishes to  seek consultation at different facility once being discharged  from here. At present he agrees to go home with hospice followup.  Patient's PICC line and Port-A-Cath was removed on 1/19 and and ordered repeat blood culture which is negative for growth at  48 hours.  I will order for a PICC line placement and resume his TPN that is currently being held.  -Patient will then be discharged home on TPN and IV vancomycin with home hospice followup. He will likely have his TPN resumed on 1/22 and can be discharged on 1/23.  Consultants:  Oncology  [facility]  IR  General surgery.  Critical care medicine.  Palliative care medicine  ID   Procedures:  NG tube  Change of cholecystostomy tube by IR on 1/6  Diagnostic small bowel enteroscopy 1/14  IR gastrostomy on 1/16 PICC reinsertion ( 1/21-122)   Antibiotics:  IV vancomycin 1/6 >  IV Zosyn 1/6 > 1/8    HPI/Subjective:  No overnight issues. Spasms minimal.      Objective: Filed Vitals:   01/05/13 1439 01/05/13 2137 01/06/13 0550 01/06/13 1513  BP: 99/75 126/76 105/77 113/75  Pulse: 81 80 83 85  Temp: 97.6 F (36.4 C) 97.9 F (36.6 C) 98.1 F (36.7 C) 97.6 F (36.4 C)  TempSrc: Oral Oral Oral Oral  Resp: 16 18 18 18   Height:      Weight:      SpO2: 100% 100% 100% 95%    Intake/Output Summary (Last 24 hours) at 01/06/13 1552 Last data filed at 01/06/13 1514  Gross per 24 hour  Intake   1380 ml  Output   3065 ml  Net  -1685 ml   Filed Weights   12/23/12 0315 12/24/12 0400 12/30/12 0843  Weight: 93.5 kg (206 lb 2.1 oz) 92.7 kg (204 lb 5.9 oz) 92.534 kg (204 lb)    Exam:  General: No acute distress.  CVS: N s1&s2 no murmur rubs or gallops  Chest: clear to auscultation bilaterally, no wheezing, rales or rhonchi  Abdomen: soft nontender, nondistended,RLQ ostomy. RUQ cholecystostomy drain. Upper midline Eakin's pouch. LLQ drain . New gastrostomy tube with dark bilious output,  Ext: warm, no edema  Neuro: AAOX 3 no focal neurological deficits    Data Reviewed: Basic  Metabolic Panel:  Lab 01/06/13 1191 01/04/13 0640 01/03/13 0500 01/02/13 0619 01/01/13 1851 01/01/13 0255 12/31/12 0435  NA 139 131* 126* 125* 125* -- --  K 3.7 3.7 3.4* 3.9 3.8 -- --  CL 109 104 99 97 94* -- --  CO2 19 17* 17* 17* 19 -- --  GLUCOSE 100* 115* 125* 143* 131* -- --  BUN 26* 35* 46* 52* 50* -- --  CREATININE 0.99 0.83 0.94 1.05 0.93 -- --  CALCIUM 10.1 9.6 9.6 9.6 10.2 -- --  MG 1.6 -- -- -- 1.8 1.8 1.2*  PHOS 5.0* 4.5 3.1 3.9 4.8* -- --   Liver Function Tests:  Lab 01/06/13 0815 01/01/13 1851 01/01/13 0255  AST 30 24 29   ALT 50 57* 67*  ALKPHOS 390* 357* 385*  BILITOT 1.0 1.0 1.0  PROT 6.8 7.3 7.4  ALBUMIN 2.2* 2.6* 2.7*   No results found for this basename: LIPASE:5,AMYLASE:5 in the last 168 hours No results found for this basename: AMMONIA:5 in the last 168 hours CBC:  Lab 01/06/13 0815 01/04/13 1025  WBC 6.1 8.1  NEUTROABS 4.4 --  HGB 8.7* 8.8*  HCT 26.5* 26.5*  MCV 85.8 83.6  PLT 292 274  Cardiac Enzymes: No results found for this basename: CKTOTAL:5,CKMB:5,CKMBINDEX:5,TROPONINI:5 in the last 168 hours BNP (last 3 results)  Basename 12/22/12 1700  PROBNP 33.8   CBG:  Lab 01/06/13 1311 01/06/13 0541 01/05/13 2352 01/05/13 1813 01/05/13 1137  GLUCAP 105* 84 82 86 96    Recent Results (from the past 240 hour(s))  CATH TIP CULTURE     Status: Normal   Collection Time   01/04/13  2:18 PM      Component Value Range Status Comment   Specimen Description CATH TIP   Final    Special Requests NONE   Final    Culture NO GROWTH 2 DAYS   Final    Report Status 01/06/2013 FINAL   Final   CULTURE, BLOOD (ROUTINE X 2)     Status: Normal (Preliminary result)   Collection Time   01/04/13  6:55 PM      Component Value Range Status Comment   Specimen Description BLOOD LEFT ANTECUBITAL   Final    Special Requests Normal BOTTLES DRAWN AEROBIC AND ANAEROBIC 5CC   Final    Culture  Setup Time 01/05/2013 14:11   Final    Culture     Final    Value:         BLOOD CULTURE RECEIVED NO GROWTH TO DATE CULTURE WILL BE HELD FOR 5 DAYS BEFORE ISSUING A FINAL NEGATIVE REPORT   Report Status PENDING   Incomplete   CULTURE, BLOOD (ROUTINE X 2)     Status: Normal (Preliminary result)   Collection Time   01/04/13  7:01 PM      Component Value Range Status Comment   Specimen Description BLOOD LEFT ANTECUBITAL   Final    Special Requests Normal BOTTLES DRAWN AEROBIC ONLY 3CC   Final    Culture  Setup Time 01/05/2013 14:10   Final    Culture     Final    Value:        BLOOD CULTURE RECEIVED NO GROWTH TO DATE CULTURE WILL BE HELD FOR 5 DAYS BEFORE ISSUING A FINAL NEGATIVE REPORT   Report Status PENDING   Incomplete      Studies: No results found.  Scheduled Meds:   . antiseptic oral rinse  15 mL Mouth Rinse BID  . enoxaparin (LOVENOX) injection  40 mg Subcutaneous Q24H  . insulin aspart  0-9 Units Subcutaneous Q6H  . lip balm  1 application Topical BID  . metoCLOPramide  10 mg Intravenous QID  . pantoprazole  40 mg Intravenous Q12H  . sodium chloride  10-40 mL Intracatheter Q12H  . vancomycin  750 mg Intravenous Q12H   Continuous Infusions:   . sodium chloride 100 mL/hr at 01/04/13 1147  . dextrose 5 % and 0.9% NaCl 75 mL/hr at 01/06/13 0229      Time spent: 25 minutes    Eddie North  [facility] Pager [phone] If 8PM-8AM, please contact night-coverage at [email or web address], password [facility] 01/06/2013, 3:52 PM  LOS: 18 days

## 2013-01-06 NOTE — Progress Notes (Signed)
Physical Therapy Treatment Patient Details Name: [name] MRN: [id number] DOB: 08-May-1956 Today's Date: 01/06/2013 Time: 1129-1200 PT Time Calculation (min): 31 min  PT Assessment / Plan / Recommendation Comments on Treatment Session  POD # 8 Duodenal Stent placement with multiple drains and gastric tube currently on suction. RN detatched gastric tube for session. Assisted pt OOB to amb in hallway 2 shorrt distances Pt progressing slowly and will need SNF for Rehab.    Follow Up Recommendations  SNF     Does the patient have the potential to tolerate intense rehabilitation     Barriers to Discharge        Equipment Recommendations  Rolling walker with 5" wheels    Recommendations for Other Services    Frequency Min 3X/week   Plan Discharge plan remains appropriate    Precautions / Restrictions Precautions Precautions: Fall Precaution Comments: Multiple drains and gastric tube......................clear liquid diet Restrictions Weight Bearing Restrictions: No   Pertinent Vitals/Pain C/o ABD "soreness" "tightness"    Mobility  Bed Mobility Bed Mobility: Supine to Sit Supine to Sit: 4: Min assist Sitting - Scoot to Edge of Bed: 4: Min guard Details for Bed Mobility Assistance: Min Assist with supine to sit due to "soreness" ABD (multiple tubes/drains) Transfers Transfers: Sit to Stand;Stand to Sit Sit to Stand: 4: Min assist;From bed;From chair/3-in-1 Stand to Sit: 4: Min assist;To chair/3-in-1 Details for Transfer Assistance: increased time and 25% VBCs on proper tech and hand placement Ambulation/Gait Ambulation/Gait Assistance: 1: +2 Total assist Ambulation/Gait: Patient Percentage: 90% Ambulation Distance (Feet): 50 Feet (25' x2 sitting rest break) Assistive device: Rolling walker Ambulation/Gait Assistance Details: No c/o pain just overall weakness. Amb pt 2 short distances with one sitting rest break.  Gait Pattern: Step-through pattern;Decreased stride  length;Trunk flexed Gait velocity: decreased General Gait Details: limited activity tolerance due to extended length of stay.    PT Goals                                            Progressing slowly    Visit Information  Last PT Received On: 01/06/13    Subjective Data  Subjective: I feel alright   Cognition       Balance     End of Session PT - End of Session Equipment Utilized During Treatment: Gait belt Activity Tolerance: Patient limited by fatigue Patient left: in chair;with call bell/phone within reach;with nursing in room Nurse Communication: Mobility status   Felecia Shelling  PTA [facility]  Acute  Rehab Pager     [phone]

## 2013-01-06 NOTE — Progress Notes (Signed)
Pt. Refused AM lab draw, states he was told by his MD he did not have any this morning. Pt. Educated and will relay this to daytime RN in order to follow up with MD during rounds.

## 2013-01-07 LAB — BASIC METABOLIC PANEL
BUN: 24 mg/dL — ABNORMAL HIGH (ref 6–23)
Creatinine, Ser: 1.08 mg/dL (ref 0.50–1.35)
GFR calc Af Amer: 87 mL/min — ABNORMAL LOW (ref >90)
GFR calc non Af Amer: 75 mL/min — ABNORMAL LOW (ref >90)
Glucose, Bld: 106 mg/dL — ABNORMAL HIGH (ref 70–99)
Potassium: 3.2 mEq/L — ABNORMAL LOW (ref 3.5–5.1)

## 2013-01-07 LAB — GLUCOSE, CAPILLARY: Glucose-Capillary: 103 mg/dL — ABNORMAL HIGH (ref 70–99)

## 2013-01-07 MED ORDER — SODIUM CHLORIDE 0.9 % IJ SOLN: 10.0000 mL | INTRAMUSCULAR | Status: DC | PRN

## 2013-01-07 MED ORDER — TRACE MINERALS CR-CU-F-FE-I-MN-MO-SE-ZN IV SOLN
INTRAVENOUS | Status: AC
  Administered 2013-01-07: 19:00:00 via INTRAVENOUS
  Filled 2013-01-07: qty 2760

## 2013-01-07 MED ORDER — POTASSIUM CHLORIDE 10 MEQ/100ML IV SOLN
10.0000 meq | INTRAVENOUS | Status: DC
  Administered 2013-01-07: 10 meq via INTRAVENOUS
  Filled 2013-01-07 (×4): qty 100

## 2013-01-07 MED ORDER — SODIUM CHLORIDE 0.9 % IJ SOLN: 10.0000 mL | Freq: Two times a day (BID) | INTRAMUSCULAR | Status: DC

## 2013-01-07 MED ORDER — FAT EMULSION 20 % IV EMUL
250.0000 mL | INTRAVENOUS | Status: AC
  Administered 2013-01-07: 250 mL via INTRAVENOUS
  Filled 2013-01-07: qty 250

## 2013-01-07 MED ORDER — POTASSIUM CHLORIDE 10 MEQ/100ML IV SOLN
10.0000 meq | INTRAVENOUS | Status: AC
  Administered 2013-01-07 (×3): 10 meq via INTRAVENOUS
  Filled 2013-01-07 (×3): qty 100

## 2013-01-07 NOTE — Progress Notes (Signed)
Cm spoke with pt's wife Inmer Nix @ [phone] concerning DME. Per Mrs.Mariann Laster DME delivery scheduled with [facility] today between 6-8pm. CM informed pt. Pt's adult sister Corrie Dandy present. Pt states having concerns about home IV ABX, TNA. Pt informed [facility] RN can provide education concerning the IV ABX,TNA in the room. Per pt, five siblings and adult son to assist in home care. Pt would like son or one of the siblings in room for the education. CM spoke to [facility] rep Belenda Cruise concerning setting a time to provide education. [facility] rep to coordinate with RN and pt appropriate time for teaching. Will continue to follow.    Roxy Manns Eron Goble,RN,BSN [phone]

## 2013-01-07 NOTE — Progress Notes (Signed)
Continuing to follow to assist with planning for [facility] to follow when patient stable for discharge; this writer spoke with patient this afternoon-he had questions related to the 'source of infection' and where things stood in terms of his discharge - he did not remember talking with Dr Tat this morning; this Clinical research associate encouraged him and family members in the room to re-address questions with Dr Tat and also made Dr Tat aware that the patient had questions  - Theodosia Blender, patient's wife, also contacted this Clinical research associate today at 3:00 pm to inform that her husband had called her concerned that a 'crank bed' would be delivered to the home and he needed to be sure everyone knew how to operate this; Theodosia Blender stated she tried to assure patient that the DME had been discussed and she knew this wasn't the case but he was not convinced- Songia requested this Clinical research associate speak with patient again about DME - Theodosia Blender discussed with this Clinical research associate that she has noticed her husband has been having some forgetful periods and questions if information he receives is getting jumbled- encouraged her to have family with patient for any instruction- spoke with patient about concern, he voiced his questions were answered, just wanted to be sure what type of bed would be at the home- did not remember previous discussion about equipment - requested DME (per 1/20 note) is to be delivered to patient's home this evening between 6-8 pm  - Theodosia Blender mentioned she was hoping to be available to meet with [facility] infusion RN  But she cannot get to hospital until after 5 pm -she informed her son will be available  -per notes patient awaiting PICC placement & resuming TPN - Clinical research associate was present in room when [facility] infusion representative, Pam spoke with patient this afternoon- plan is for infusion representative to meet with patient and family to initiate instruction on home TPN tomorrow, Thursday at 3:30pm Discussed with Theodosia Blender on phone, and patient, patient's brother Casimiro Needle and  niece Joni Reining in the room that the [facility] nurse will also reinforce education and instructions once patient is at home; they voiced 'knowing this was reassuring'. Will continue to follow   Valente David, RN 01/07/2013, 4:30 PM [facility] Palliative Medicine Team RN Liaison [phone]

## 2013-01-07 NOTE — Progress Notes (Signed)
Patient ZO:[id number] JANTZ MAIN      DOB: [birth year]      WUJ:[id number]  Resumed care of patient today.  Reviewed events of the week and discussed case with hospice liaison and hospitalist assuming.  care.  Reviewed Dr. Ephriam Knuckles recommendation 1/14 to give line holiday for 48 which was acted upon by removing PICC and Port on 1/19/.  Cultures from 1/19 remain pending , tip of port negative x 2 days.  PICC replacement is pending .   For the purposes of discharge planning, the patient is unable to feed effectively by mouth because of his obstruction.  He will need the PICC replaced regardless of culture results to resume TPN.  The culture results will be important to guiding antibiotic use and length of use.  Prior to removal to PICC and Port the option was indefinite Vancomycin use to promote comfort.  Now that lines removed will need recommendation from ID regarding length of antibiotic use if 48 hour cultures are negative.  I have updated Dr. Arbutus Leas on my observation that this will change his discharge plans for indefinite Vancomycin.  I have asked him to check with ID.  Mechel Schutter L. [name], MD MBA The Palliative Medicine Team at [facility] Phone: [phone] Pager: [phone]

## 2013-01-07 NOTE — Progress Notes (Signed)
PT Cancellation Note  Patient Details Name: [name] MRN: [id number] DOB: [birth year]   Cancelled Treatment:   Pt deferred PT this afternoon. He asks me to come back in the morning    [name] 01/07/2013, 4:50 PM

## 2013-01-07 NOTE — Progress Notes (Signed)
Peripherally Inserted Central Catheter/Midline Placement  The IV Nurse has discussed with the patient and/or persons authorized to consent for the patient, the purpose of this procedure and the potential benefits and risks involved with this procedure.  The benefits include less needle sticks, lab draws from the catheter and patient may be discharged home with the catheter.  Risks include, but not limited to, infection, bleeding, blood clot (thrombus formation), and puncture of an artery; nerve damage and irregular heat beat.  Alternatives to this procedure were also discussed.  PICC/Midline Placement Documentation        Lisabeth Devoid 01/07/2013, 6:57 PM

## 2013-01-07 NOTE — Progress Notes (Signed)
Extra supplies ordered per [facility] RN request including ostomy bags, ostomy ring, and eakin bag.  Supplies placed in cabinet above sink in room.  Please ensure pt takes supplies home as requested by home health.

## 2013-01-07 NOTE — Progress Notes (Signed)
[facility] PROGRESS NOTE  [name] JXB:[id number] DOB: 1956-07-29 DOA: 12/19/2012 PCP: [name], MD  Assessment/Plan: MRSA bacteremia  -? Line sepsis (has PICC line and Port-A-Cath): Less likely from Cholangitis.  - currently on vancomycin. Patient was transferred to ICU for hypotension and tachycardia.--resolved - ID recommended removal of PICC line & Port-A-Cath, line holiday , check repeat blood culture and prolonged IV antibiotics. Blood cultures were 2 x 2 positive for MRSA. Repeat blood cultures 1/11 negative..  Patient finally agreed on getting hospice involved on Dr [name] 's request.  -Will removeDd PICC line and Port-A-Cath on 1/19 ( finally after patient agreed) and repeated blood culture. Blood cx negative at 72hrs  -Await new PICC line -Discussed with ID--plan 6 weeks of intravenous vancomycin with the start date of 01/04/2013 Pancytopenia: Leukopenia and thrombocytopenia have resolved. H/h stable.  Hypokalemia and hypomagnesemia:  Pharmacy replacing potassium and magnesium. Improved.  Spasms  spams of ribs , abdomen and legs. Improved with increased dose of Robaxin and low-dose morphine  Hyponatremia  Likely with hypovolemia. Improved with IV hydration  .  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 Patient has had multiple surgical procedures . Palliative consult appreciated .  Patient wishes to be partial code, wants CPR but no intubation or mechanical ventilation DNI  Small bowel obstruction/duodenal obstruction despite stent:  Currently conservative management-n.p.o., and TPN. Failed NGT clamping trial after 5-1/2 hours on 1/11.. Failed attempt at stent revision on 1/14 - progression of tumor infiltration . GI have no further recommendations.  Successful gastrostomy tube placed by IR on 1/16 . Was attached to wall suction , was uncomfortable on  placing to gravity. Hospice will arrange for suction pump at home as needed  -Cholecystitis status post cholecystostomy drain placed at [facility]  - IR performed cholecystostomy tube change on 1/6.   Code Status: partial ( DNI but not DNR)  Family Communication: Discussed with patient  Disposition Plan:  Patient is very hopeful about getting better and repeatedly requests on getting opinion is at different centers including [facility] and [facility] for possible transfer. I discussed this periodically with him that chemotherapy is not an option at this time and his surgeon Dr. [name] at [facility] also did not suggest the need for any surgery at this time. He was seen by his oncologist [name] again on 1/20 who recommended that he is not a candidate for any further treatment or clinical studies given his advanced disease, multiple complications and poor functional status. He wishes to seek consultation at different facility once being discharged from here. At present he agrees to go home with hospice followup.  Patient's PICC line and Port-A-Cath was removed on 1/19 and and ordered repeat blood culture which is negative for growth at 48 hours. I will order for a PICC line placement and resume his TPN that is currently being held.  -Patient will then be discharged home on TPN and IV vancomycin with home hospice followup. He will likely have his TPN resumed on if PICC line is placed 1/22 and can be discharged on 1/23.  Consultants:  Oncology  [facility]  IR  General surgery.  Critical care medicine.  Palliative care medicine  ID Procedures:  NG tube  Change of cholecystostomy tube by IR on 1/6  Diagnostic small bowel enteroscopy 1/14  IR gastrostomy on 1/16  PICC reinsertion ( 1/21-122) Antibiotics:  IV vancomycin 1/6 >  IV Zosyn 1/6 > 1/8          Procedures/Studies: Dg Abd 1 View - Kub  12/11/2012  *RADIOLOGY REPORT*  Clinical Data: Duodenal stricture.  Attempted endoscopy.  DG  C-ARM GT 120 MIN,ABDOMEN - 1 VIEW  Technique: 20 minutes and 33 seconds of fluoroscopy was utilized.  Comparison:  Upper GI 12/09/2012.  Findings: Three images are submitted.  The biliary drain and abdominal drain are in place.  The endoscope remains in the stomach, all three images.  IMPRESSION:  1.  Intraoperative fluoroscopy for guidance of endoscopy.   Original Report Authenticated By: Marin Roberts, M.D.    Ct Abdomen Pelvis W Contrast  12/16/2012  *RADIOLOGY REPORT*  Clinical Data: Small bowel obstruction  CT ABDOMEN AND PELVIS WITH CONTRAST  Technique:  Multidetector CT imaging of the abdomen and pelvis was performed following the standard protocol during bolus administration of intravenous contrast.  Contrast: 80mL OMNIPAQUE IOHEXOL 300 MG/ML  SOLN  Comparison: Abdominal x-ray is of multiple recent base.  Findings: Images which include the lower chest shows small bilateral pleural effusions with bibasilar collapse / consolidation.  NG tube tip is noted in the mid stomach.  There appears to be some wall thickening in the distal stomach, but this may be related to the underdistended state.  Duodenum is opacified.  There is a stent in the proximal jejunum.  Contrast material enters the proximal end of the stent and a very thin string of contrast can be seen to track through the stent lumen in the small bowel beyond the stent.  There is soft tissue or debris filling the stent lumen creating only is a very thin channel for flow of contrast through the stent.  Small bowel loops distal to the stent are decompressed.  There is a small amount of free fluid in the abdomen. Bowel anatomy is difficult to discern given the fairly substantial amount of adhesions and scarring in this patient with peritoneal carcinomatosis.  There is a right lower quadrant stoma, presumably ileostomy.  The patient appears to be status post omentectomy and probably right hemicolectomy.  A small bowel anastomoses is seen in the  right abdomen.  No focal abnormalities seen in the liver or spleen.  Pancreas is unremarkable.  The adrenal glands are normal.  No evidence for hydronephrosis.  Probable cyst noted in the right kidney.  No abdominal aortic aneurysm.  Pigtail catheter is seen coiled in the anterior midline in or just deep to the rectus sheath.  A second to pigtail catheter in the right upper quadrant adjacent to the gallbladder fundus is consistent with a reported history of cholecystostomy.  The gallbladder is decompressed.  Surgical drain is positioned in the left paracolic gutter.  Imaging through the pelvis shows enlarged prostate gland.  The bladder is decompressed.  Left colon is decompressed.  Bone windows reveal no worrisome lytic or sclerotic osseous lesions.  IMPRESSION: The proximal jejunal stent device is patent although only of very thin string of contrast material passes through the stent into the decompressed small bowel distal to the stent. Even with the stent in place, this segment of bowel appears to represent a region of luminal stenosis.  The duodenum proximal to the stent is distended.  Distal stomach appears to have abnormal circumferential wall thickening with some luminal compromise, but no outlet obstruction at this time.   Original Report Authenticated By: Kennith Center, M.D.    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  The patient is also status post prior percutaneous cholecystostomy tube placement for acalculous cholecystitis.  Assessment of the cholecystostomy tube is performed.  1.  CHOLANGIOGRAM THROUGH PREEXISTING CATHETER 2.  PERCUTANEOUS CHOLECYSTOSTOMY TUBE EXCHANGE  Contrast:  50 ml Omnipaque-300  Fluoroscopy Time: 7.5 minutes.  Procedure:  The procedure, risks, benefits, and alternatives were explained to the patient.  Questions regarding the procedure were encouraged  and answered.  The patient understands and consents to the procedure.  The preexisting percutaneous cholecystostomy tube was prepped with Betadine in a sterile fashion, and a sterile drape was applied covering the operative field.  A sterile gown and sterile gloves were used for the procedure.  The preexisting catheter was injected with contrast material under fluoroscopy.  It was then removed over a guidewire.  A 5-French catheter was then introduced over a guidewire and advanced into the lumen of the gallbladder.  Additional contrast injection was performed.  The catheter was further advanced through the cystic duct and into the common bile duct and additional cholangiogram performed through the catheter.  The catheter was then retracted into the lumen of the gallbladder.  A new 10-French multipurpose drainage catheter was advanced over a wire and formed in the gallbladder lumen.  Final catheter position was confirmed with a fluoroscopic spot image obtained after injection of contrast.  The catheter was secured at the skin with a Prolene retention suture and Stat-Lock device.  The catheter was connected to a gravity drainage bag.  Complications:  None  Findings:  Injection of a preexisting drain shows the catheter to lie just outside the lumen of the gallbladder.  Contrast injection does partially fill the gallbladder lumen and demonstrates a significant amount of internal debris and sludge within the gallbladder.  After removal of the preexisting catheter, a percutaneous tract to the gallbladder lumen was able to be catheterized.  Cholangiogram shows very slow and sluggish flow of contrast via the cystic duct into the common bile duct.  A significant amount of debris is present extending into the cystic duct and also within the distal aspect of the common bile duct.  With a catheter further advanced into the CBD, contrast injection does show a patent common bile duct with flow of contrast noted via the ampulla  into the duodenum.  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  There is a fistula demonstrated to adjacent small bowel with eventual opacification of several right sided small bowel loops demonstrating poor motility.  An additional fistula extends medially into a space adjacent to the descending duodenum.  Given demonstration of fistulas as well as poor flow of contrast from the gallbladder into the common bile duct, a new cholecystostomy tube was placed and formed at the level of the gallbladder lumen.  This will be left to gravity drainage.  IMPRESSION: The preexisting catheter lies just outside the lumen of the gallbladder.  After recanalizing a tract to the gallbladder lumen, contrast injection demonstrates significant debris in the lumen of the gallbladder and poor flow of contrast into the common bile duct.  Debris was also present in the distal aspect of the CBD. The study also demonstrated complex fistulas communicating with the gallbladder lumen and opacifying both small bowel as well as a tract to a cavity lying adjacent to the descending duodenum.  A new 10-French catheter was formed in the gallbladder and will  be left to gravity drainage.   Original Report Authenticated By: Irish Lack, M.D.    Ir Gastrostomy Tube Mod Sed  01/01/2013  *RADIOLOGY REPORT*  PULL THROUGH GASTROSTOMY TUBE PLACEMENT UNDER FLUOROSCOPIC GUIDANCE  Date: 01/01/2013  Clinical History: 57 year old male with terminal peritoneal carcinomatosis and jejunal obstruction.  He requires a diverting gastrostomy tube for palliation.  Procedures Performed:  1.  Fluoroscopically guided placement of percutaneous pull-through gastrostomy tube.  Interventional Radiologist:  Sterling Big, MD  Sedation: Moderate (conscious) sedation was used.  To the mg Versed, 100 mcg Fentanyl were administered intravenously.  The patient's vital signs were monitored continuously by  radiology nursing throughout the procedure.  Sedation Time: 30 minutes  Fluoroscopy time: 4.8 minutes  Contrast volume: 10 ml Omnipaque-300 administered into the stomach  PROCEDURE/FINDINGS:   Informed consent was obtained from the patient following explanation of the procedure, risks, benefits and alternatives. The patient understands, agrees and consents for the procedure. All questions were addressed. A time out was performed.  Maximal barrier sterile technique utilized including caps, mask, sterile gowns, sterile gloves, large sterile drape, hand hygiene, and chlorhexadine skin prep.  An angled catheter was advanced over a wire under fluoroscopic guidance through the nose, down the esophagus and into the body of the stomach.  The stomach was then insufflated with several 100 ml of air.  Fluoroscopy confirmed location of the gastric bubble, as well as inferior displacement of the barium stained colon.  Under direct fluoroscopic guidance, a single T-tack was placed, and the anterior gastric wall drawn up against the anterior abdominal wall. Percutaneous access was then obtained into the mid gastric body with an 18 gauge sheath needle.  Aspiration of air, and injection of contrast material under fluoroscopy confirmed needle placement.  An Amplatz wire was advanced in the gastric body.  The tract was then serially dilated to 12-French.  A 12 [place] Wills-Oglesby decompressive gastrostomy tube was then advanced over the wire and positioned within the gastric body.  A gentle hand injection of contrast material confirmed intragastric location.  This single T tack was cut.  The tube was secured to the skin with both Prolene suture.  There is no immediate complication, the patient tolerated the procedure well.  The patient will be observed for several hours with the newly placed tube on low wall suction to evaluate for any post procedure complication.  IMPRESSION:  Successful placement of a 22 French decompressive  gastrostomy tube. The tube is designed for decompression only and is not designed for administration of nutrition or medications.  Connect to low wall suction as needed.  Signed,  Sterling Big, MD Vascular & Interventional Radiologist [facility] Radiology   Original Report Authenticated By: Malachy Moan, M.D.    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  The patient is also status post prior percutaneous cholecystostomy tube placement for acalculous cholecystitis.  Assessment of the cholecystostomy tube is performed.  1.  CHOLANGIOGRAM THROUGH PREEXISTING CATHETER 2.  PERCUTANEOUS CHOLECYSTOSTOMY TUBE EXCHANGE  Contrast:  50 ml Omnipaque-300  Fluoroscopy Time: 7.5 minutes.  Procedure:  The procedure, risks, benefits, and alternatives were explained to the patient.  Questions regarding the procedure were encouraged and answered.  The patient understands and consents to the procedure.  The preexisting percutaneous cholecystostomy tube was prepped with Betadine in a sterile fashion, and a sterile drape was applied covering the operative field.  A sterile gown and sterile gloves were used for the procedure.  The preexisting catheter was injected with contrast material under fluoroscopy.  It was then removed over a guidewire.  A 5-French catheter was then introduced over a guidewire and advanced into the lumen of the gallbladder.  Additional contrast injection was performed.  The catheter was further advanced through the cystic duct and into the common bile duct and additional cholangiogram performed through the catheter.  The catheter was then retracted into the lumen of the gallbladder.  A new 10-French multipurpose drainage catheter was advanced over a wire and formed in the gallbladder lumen.  Final catheter position was confirmed with a fluoroscopic spot image obtained after  injection of contrast.  The catheter was secured at the skin with a Prolene retention suture and Stat-Lock device.  The catheter was connected to a gravity drainage bag.  Complications:  None  Findings:  Injection of a preexisting drain shows the catheter to lie just outside the lumen of the gallbladder.  Contrast injection does partially fill the gallbladder lumen and demonstrates a significant amount of internal debris and sludge within the gallbladder.  After removal of the preexisting catheter, a percutaneous tract to the gallbladder lumen was able to be catheterized.  Cholangiogram shows very slow and sluggish flow of contrast via the cystic duct into the common bile duct.  A significant amount of debris is present extending into the cystic duct and also within the distal aspect of the common bile duct.  With a catheter further advanced into the CBD, contrast injection does show a patent common bile duct with flow of contrast noted via the ampulla into the duodenum.  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  There is a fistula demonstrated to adjacent small bowel with eventual opacification of several right sided small bowel loops demonstrating poor motility.  An additional fistula extends medially into a space adjacent to the descending duodenum.  Given demonstration of fistulas as well as poor flow of contrast from the gallbladder into the common bile duct, a new cholecystostomy tube was placed and formed at the level of the gallbladder lumen.  This will be left to gravity drainage.  IMPRESSION: The preexisting catheter lies just outside the lumen of the gallbladder.  After recanalizing a tract to the gallbladder lumen, contrast injection demonstrates significant debris in the lumen of the gallbladder and poor flow of contrast into the common bile duct.  Debris was also present in the distal aspect of the CBD. The study also demonstrated  complex fistulas communicating with the gallbladder lumen and opacifying both small bowel as well as a tract to a cavity lying adjacent to the descending duodenum.  A new 10-French catheter was formed in the gallbladder and will be left to gravity drainage.   Original Report Authenticated By: Irish Lack, M.D.    Dg Sinus/fist Tube Chk-non Gi  12/22/2012  *RADIOLOGY REPORT*  Clinical Data:Evaluate for fistula  ABSCESS INJECTION  Fluoroscopy Time: 1.09 minutes  Comparison: CT of the abdomen and pelvis dated 12/16/2012  Findings: Scout radiograph was obtained.  This shows a nasogastric tube within the stomach.  There is a metallic stent identified within the proximal jejunum.  JP drain overlies the left lower quadrant of the abdomen there is a percutaneous cholecystostomy tube within the right abdomen.  Left lower quadrant the JP drain was accessed and 40 ml of omni 300 was injected into the drain.  No significant extravasation of contrast material from the draining identified to suggest residual abscess or fistula formation.  Mild intra vascular extravasation with opacification of the left lower quadrant venous structures noted.  IMPRESSION: 1.  No evidence for residual abscess or fistula formation surrounding the left lower quadrant JP drain.  Findings were discussed with Dr. Michaell Cowing at the time the procedure was performed on 12/22/2012.   Original Report Authenticated By: Signa Kell, M.D.    Dg Chest Port 1 View  12/22/2012  *RADIOLOGY REPORT*  Clinical Data: Chills.  Shortness of breath.  PORTABLE CHEST - 1 VIEW  Comparison: 11/27/2012  Findings: Left Port-A-Cath and right PICC line remain in place, unchanged.  NG tube has been placed into the stomach.  Lungs are clear.  No effusions.  Heart is normal size.  IMPRESSION: No acute cardiopulmonary disease.   Original Report Authenticated By: Charlett Nose, M.D.    Dg Ercp  12/13/2012  *RADIOLOGY REPORT*  Clinical Data: Stenotic small bowel segment just  distal to the ligament of Treitz.  ERCP  Comparison:  Small bowel series from 12/09/2012  Technique:  Multiple spot images obtained with the fluoroscopic device and submitted for interpretation post-procedure.  ERCP was performed by Dr.  Arlyce Dice.  Findings: Four spot fluoro images are submitted.  I discussed this study by telephone with Dr. Arlyce Dice immediately after the procedure was completed.  The first image demonstrates a small bowel stent over the left abdomen.  The second image shows injection of contrast material which opacifies the dilated loop just proximal to the intraluminal stent. The stent is opacified throughout its entire length and there does appear to be contrast draining from the distal end of the stent into nondilated small bowel.  The third image again shows contrast in the dilated loop proximal to the stent with opacification of the stent lumen down to the distal end of the stent.  The fourth image shows contrast material in the dilated small bowel loop just proximal to the stent.  There is contrast within the stent device with a small amount of contrast visible in the small bowel just distal to the stent.  Comparing this see image to image 16 of the previous small bowel series, the orientation of the bowel is very similar and confirms that the stent device is in the stenotic segment.  There is also some intraluminal contrast visible within small bowel superimposed on the distal stent which I think is probably some retrograde reflux of contrast back up along the stent, between the stent device and the bowel mucosa.  IMPRESSION: Endoscopic small bowel stent placement just distal to the ligament of Treitz.  These images are consistent with flow of contrast material from the dilated proximal small bowel through the stent into the decompressed small bowel distal to the stent.  These images were submitted for radiologic interpretation only. Please see the procedural report for the amount of contrast  and the fluoroscopy time utilized.   Original Report Authenticated By: Kennith Center, M.D.    Dg Abd Portable 1v  12/25/2012  *RADIOLOGY REPORT*  Clinical Data: NG tube placement  PORTABLE ABDOMEN - 1 VIEW  Comparison: 12/15/2012  Findings: Enteric stent remains in place.  Surgical drain along the left side of abdomen.  Cholecystostomy tube projects over the gallbladder lumen.  NG tube placed with its tip in the fundus of the stomach.  No obvious free intraperitoneal gas.  No disproportionate dilatation of bowel.  IMPRESSION: NG tube tip is in the  fundus of the stomach.   Original Report Authenticated By: Jolaine Click, M.D.    Dg Abd Portable 1v  12/15/2012  *RADIOLOGY REPORT*  Clinical Data: NG tube placed  PORTABLE ABDOMEN - 1 VIEW  Comparison: 2:40 hours  Findings: NG tube has been placed with its tip in the fundus of the stomach.  The amount of contrast in the stomach has diminished.  No disproportionate dilatation of bowel.  Bowel stent is stable in the left side of the abdomen.  IMPRESSION: NG tube placed into the stomach.  Diminished contrast in the stomach.   Original Report Authenticated By: Jolaine Click, M.D.    Dg Abd Portable 1v  12/15/2012  *RADIOLOGY REPORT*  Clinical Data: Check contrast progression through the small bowel.  PORTABLE ABDOMEN - 1 VIEW  Comparison: Abdominal radiograph performed 12/14/2012  Findings: Contrast is again seen filling the stomach, and is now better seen filling the duodenum.  There is distension of the fourth segment of the duodenum to 7.2 cm.  This is highly suspicious for occlusion at the level of the jejunal stent.  No contrast is seen progressing through the jejunal stent.  The visualized bowel gas pattern is grossly unremarkable, though there is a relative paucity of bowel gas within the abdomen.  No definite free abdominal air is identified, though evaluation for free air is suboptimal on a single supine view.  The right-sided cholecystostomy tube is grossly  unremarkable in appearance.  Left-sided drains are grossly stable, though the position of the more superior drain has shifted mildly.  No acute osseous abnormalities are identified.  IMPRESSION: Contrast again seen filling the stomach, and better seen filling the duodenum.  Distension of the fourth segment of the duodenum to 7.2 cm.  This is highly suspicious for occlusion at the level of the jejunal stent.  No evidence of contrast progression beyond the jejunal stent.   Original Report Authenticated By: Tonia Ghent, M.D.    Dg Abd Portable 1v  12/14/2012  *RADIOLOGY REPORT*  Clinical Data: Small bowel obstruction; assess for patency of jejunal stent.  PORTABLE ABDOMEN - 1 VIEW  Comparison: Abdominal radiograph performed earlier today at 09:06 p.m.  Findings: Contrast is noted filling the stomach; previously noted contrast within the duodenum is no longer well seen and may have become more dilute, or may have refluxed back into the stomach.  No contrast is seen within the jejunal stent or more distally.  The visualized bowel gas pattern is grossly unremarkable. A right- sided cholecystostomy tube is again noted; left-sided drains are seen.  No acute osseous abnormalities are seen.  Retrocardiac airspace opacification is again noted.  IMPRESSION:  1.  Contrast noted filling the stomach; previously noted contrast within the duodenum is no longer well seen and may have become more dilute, or may have refluxed back into the stomach.  No contrast seen within the jejunal stent or more distally.  To assess for obstruction at the jejunal stent, a formal upper GI series and small bowel follow-through should be performed, when clinically appropriate.  In the meantime, a follow-up film may be considered in 3-4 hours.  2.  Persistent retrocardiac airspace opacification.   Original Report Authenticated By: Tonia Ghent, M.D.    Dg Abd Portable 1v  12/14/2012  *RADIOLOGY REPORT*  Clinical Data: Small bowel  follow-through; follow-up image at 3 hours 10 minutes.  PORTABLE ABDOMEN - 1 VIEW  Comparison: Abdominal radiograph performed earlier today at 06:24 p.m.  Findings: Ingested contrast remains mostly within  the stomach, with a small amount of contrast seen extending through the duodenum to the level of the jejunal stent.  No definite contrast is seen extending through the majority of the jejunal stent.  The visualized bowel gas pattern is grossly unremarkable, though there is a relative paucity of bowel gas within the abdomen.  A right-sided cholecystostomy tube is grossly unchanged in appearance.  No definite free intra-abdominal air is identified, though evaluation for free air is suboptimal on supine views.  No acute osseous abnormalities are identified.  Persistent retrocardiac airspace opacification is noted.  IMPRESSION:  1.  Ingested contrast remains mostly within the stomach, with a small amount of contrast seen extending through the duodenum to the level of the jejunal stent.  No definite contrast seen extending through the majority of the jejunal stent.  A follow-up image is planned in 2 hours.  2.  Persistent retrocardiac airspace opacification seen.   Original Report Authenticated By: Tonia Ghent, M.D.    Dg Abd Portable 1v  12/13/2012  *RADIOLOGY REPORT*  Clinical Data: Status post proximal jejunal stent placement for stenotic segment.  PORTABLE ABDOMEN - 1 VIEW  Comparison: ERCP images from earlier the same day  Findings: The contrast material which was present in the dilated small bowel just proximal to the stent is not readily evident on this film.  There is some contrast posterior wall in the fundus of the stomach. Unfortunately, it is not possible to know whether the contrast seen in the dilated small bowel proximal to the stent on the ERCP has passed through the stent device or refluxed back up into the stomach.  IMPRESSION: No contrast material visible within dilated small bowel proximal to  the stent, but since no contrast can be definitely visualized distal to the stent it is unclear whether the contrast refluxed back into the stomach or has migrated distally through the stent and become diluted out in the more distal small bowel loops.   Original Report Authenticated By: Kennith Center, M.D.    Dg Abd Portable 1v  12/09/2012  *RADIOLOGY REPORT*  Clinical Data: Nasogastric tube placement.  PORTABLE ABDOMEN - 1 VIEW  Comparison: Abdominal radiograph performed 12/07/2012  Findings: The patient's enteric tube is seen ending overlying the body of the stomach.  The visualized bowel gas pattern is grossly unremarkable.  An apparent ostomy site is noted at the right lower quadrant.  A small right-sided IJ catheter is unchanged in position.  Left-sided drains are also grossly stable.  No acute osseous abnormalities are identified.  Mild sclerotic change is seen at the sacroiliac joints.  IMPRESSION: Enteric tube noted ending overlying the body of the stomach.   Original Report Authenticated By: Tonia Ghent, M.D.    Varney Biles Kayleen Memos Hans Eden  12/14/2012  *RADIOLOGY REPORT*  Clinical Data:  Nausea and vomiting after small bowel wall stent placement.  UPPER GI SERIES WITH KUB  Technique:  Routine upper GI series was performed with thin barium.  Fluoroscopy Time: Zero minutes  Comparison:  None.  Findings: Scout view of the abdomen shows and metallic wall stent in the left abdomen.  Surgical drain is also seen in the left abdomen. Postoperative changes and a pigtail drain are seen in the right abdomen.  Relative paucity of gas overall.  An extremely limited examination was performed at the request of the referring physician.  The patient drank one couple of barium. A single AP supine image shows contrast in the stomach.  IMPRESSION: Contrast remains in the stomach after  ingestion of one cup of barium.  The patient will drink another half cup of barium and will be reimaged in 2 hours to assess patency of the small  bowel stent.   Original Report Authenticated By: Leanna Battles, M.D.    Dg Kayleen Memos W/small Bowel  12/09/2012  *RADIOLOGY REPORT*  Clinical Data:Persistent vomiting.  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 Thin barium also used.  Subsequently, serial images of the small bowel were obtained including spot views of the terminal ileum.  Fluoroscopy Time: 4.2-minute  Comparison: CT abdomen 11/27/2012  Findings: Thin barium was injected through the indwelling NG tube into  the stomach.  The stomach is mildly dilated.  The duodenum is markedly dilated.  There is a transition point at the ligament of Treitz with a stricture present at the ligament of Treitz.  There is very little contrast in the proximal jejunum.  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  Delayed images were obtained for 1 hour 15 minutes showing only a small amount of barium in the proximal jejunum.  IMPRESSION: High-grade obstruction at the ligament of Treitz with dilatation of the duodenum.  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  I discussed the findings with Willette Cluster, PA [facility]   Original Report Authenticated By: Janeece Riggers, M.D.    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  UPPER GI SERIES WITH KUB  Technique:  Routine upper GI series was performed with Omnipaque- 300  Fluoroscopy Time: 1.47 minutes  Comparison:  CT scan 12/16/2012  Findings: Contrast was infused via the NG tube in the stomach. Stomach is unremarkable.  Very little peristaltic action was demonstrated but contrast did enter into the duodenum with the patient on the right side and elevated.  Occasional GE reflux  was noted.  There was to and fro movement of contrast in the second portion of the duodenum which was mildly dilated.  Very little contrast entered the duodenal stent but a small amount did pass through it.  Very little contrast was seen beyond the stent.  IMPRESSION:  1.  Mild dilatation of the duodenum with very little contrast seen entering the duodenal stent. 2.  No dilated distal small bowel to suggest obstruction.   Original Report Authenticated By: Rudie Meyer, M.D.    Dg C-arm Gt 120 Min  12/11/2012  *RADIOLOGY REPORT*  Clinical Data: Duodenal stricture.  Attempted endoscopy.  DG C-ARM GT 120 MIN,ABDOMEN - 1 VIEW  Technique: 20 minutes and 33 seconds of fluoroscopy was utilized.  Comparison:  Upper GI 12/09/2012.  Findings: Three images are submitted.  The biliary drain and abdominal drain are in place.  The endoscope remains in the stomach, all three images.  IMPRESSION:  1.  Intraoperative fluoroscopy for guidance of endoscopy.   Original Report Authenticated By: Marin Roberts, M.D.    Dg C-arm 61-120 Min-no Report  12/30/2012  CLINICAL DATA: duodenal stricture   C-ARM 61-120 MINUTES  Fluoroscopy was utilized by the requesting physician.  No radiographic  interpretation.           Subjective: Patient denies fevers, chills, chest pain, shortness breath, nausea, vomiting, abdominal pain, dysuria, hematuria, rashes.  Objective: Filed Vitals:  01/06/13 0550 01/06/13 1513 01/06/13 2110 01/07/13 0452  BP: 105/77 113/75 120/73 125/68  Pulse: 83 85 84 82  Temp: 98.1 F (36.7 C) 97.6 F (36.4 C) 97.6 F (36.4 C) 97.2 F (36.2 C)  TempSrc: Oral Oral Oral Axillary  Resp: 18 18 18 18   Height:      Weight:      SpO2: 100% 95% 99% 100%    Intake/Output Summary (Last 24 hours) at 01/07/13 1212 Last data filed at 01/07/13 1100  Gross per 24 hour  Intake   1620 ml  Output   4257 ml  Net  -2637 ml   Weight change:  Exam:   General:  Pt is alert, follows commands  appropriately, not in acute distress  HEENT: No icterus, No thrush,  [place]/AT  Cardiovascular: RRR, S1/S2, no rubs, no gallops  Respiratory: CTA bilaterally, no wheezing, no crackles, no rhonchi  Abdomen: Soft/+BS, non tender, non distended, no guarding  Extremities: No edema, No lymphangitis, No petechiae, No rashes, no synovitis  Data Reviewed: Basic Metabolic Panel:  Lab 01/07/13 1610 01/06/13 0815 01/04/13 0640 01/03/13 0500 01/02/13 0619 01/01/13 1851 01/01/13 0255  NA 143 139 131* 126* 125* -- --  K 3.2* 3.7 3.7 3.4* 3.9 -- --  CL 112 109 104 99 97 -- --  CO2 19 19 17* 17* 17* -- --  GLUCOSE 106* 100* 115* 125* 143* -- --  BUN 24* 26* 35* 46* 52* -- --  CREATININE 1.08 0.99 0.83 0.94 1.05 -- --  CALCIUM 10.0 10.1 9.6 9.6 9.6 -- --  MG -- 1.6 -- -- -- 1.8 1.8  PHOS -- 5.0* 4.5 3.1 3.9 4.8* --   Liver Function Tests:  Lab 01/06/13 0815 01/01/13 1851 01/01/13 0255  AST 30 24 29   ALT 50 57* 67*  ALKPHOS 390* 357* 385*  BILITOT 1.0 1.0 1.0  PROT 6.8 7.3 7.4  ALBUMIN 2.2* 2.6* 2.7*   No results found for this basename: LIPASE:5,AMYLASE:5 in the last 168 hours No results found for this basename: AMMONIA:5 in the last 168 hours CBC:  Lab 01/06/13 0815 01/04/13 1025  WBC 6.1 8.1  NEUTROABS 4.4 --  HGB 8.7* 8.8*  HCT 26.5* 26.5*  MCV 85.8 83.6  PLT 292 274   Cardiac Enzymes: No results found for this basename: CKTOTAL:5,CKMB:5,CKMBINDEX:5,TROPONINI:5 in the last 168 hours BNP: No components found with this basename: POCBNP:5 CBG:  Lab 01/07/13 0610 01/06/13 2343 01/06/13 1801 01/06/13 1311 01/06/13 0541  GLUCAP 103* 104* 97 105* 84    Recent Results (from the past 240 hour(s))  CATH TIP CULTURE     Status: Normal   Collection Time   01/04/13  2:18 PM      Component Value Range Status Comment   Specimen Description CATH TIP   Final    Special Requests NONE   Final    Culture NO GROWTH 2 DAYS   Final    Report Status 01/06/2013 FINAL   Final   CULTURE, BLOOD  (ROUTINE X 2)     Status: Normal (Preliminary result)   Collection Time   01/04/13  6:55 PM      Component Value Range Status Comment   Specimen Description BLOOD LEFT ANTECUBITAL   Final    Special Requests Normal BOTTLES DRAWN AEROBIC AND ANAEROBIC 5CC   Final    Culture  Setup Time 01/05/2013 14:11   Final    Culture     Final    Value:  BLOOD CULTURE RECEIVED NO GROWTH TO DATE CULTURE WILL BE HELD FOR 5 DAYS BEFORE ISSUING A FINAL NEGATIVE REPORT   Report Status PENDING   Incomplete   CULTURE, BLOOD (ROUTINE X 2)     Status: Normal (Preliminary result)   Collection Time   01/04/13  7:01 PM      Component Value Range Status Comment   Specimen Description BLOOD LEFT ANTECUBITAL   Final    Special Requests Normal BOTTLES DRAWN AEROBIC ONLY 3CC   Final    Culture  Setup Time 01/05/2013 14:10   Final    Culture     Final    Value:        BLOOD CULTURE RECEIVED NO GROWTH TO DATE CULTURE WILL BE HELD FOR 5 DAYS BEFORE ISSUING A FINAL NEGATIVE REPORT   Report Status PENDING   Incomplete      Scheduled Meds:   . antiseptic oral rinse  15 mL Mouth Rinse BID  . enoxaparin (LOVENOX) injection  40 mg Subcutaneous Q24H  . insulin aspart  0-9 Units Subcutaneous Q6H  . lip balm  1 application Topical BID  . metoCLOPramide  10 mg Intravenous QID  . pantoprazole  40 mg Intravenous Q12H  . potassium chloride  10 mEq Intravenous Q1 Hr x 4  . sodium chloride  10-40 mL Intracatheter Q12H  . vancomycin  750 mg Intravenous Q12H   Continuous Infusions:   . sodium chloride 100 mL/hr at 01/04/13 1147  . dextrose 5 % and 0.9% NaCl 75 mL/hr at 01/07/13 0645  . TPN (CLINIMIX) +/- additives     And  . fat emulsion       Lylianna Fraiser, DO  [facility] Pager [phone]  If 7PM-7AM, please contact night-coverage [email or web address] Password [email or web address] 01/07/2013, 12:12 PM   LOS: 19 days

## 2013-01-07 NOTE — Progress Notes (Signed)
PARENTERAL NUTRITION CONSULT NOTE - Follow-Up  Pharmacy Consult for TPN - plan PICC replacement 1/22 and restart tonight Indication: SBO secondary to peritoneal carcinomatosis  Allergies  Allergen Reactions  . Percocet (Oxycodone-Acetaminophen)     hallucination   Patient Measurements: Height: 6\' 4"  (193 cm) Weight: 204 lb (92.534 kg) IBW/kg (Calculated) : 86.8   Vital Signs: Temp: 97.2 F (36.2 C) (01/22 0452) Temp src: Axillary (01/22 0452) BP: 125/68 mmHg (01/22 0452) Pulse Rate: 82  (01/22 0452) Intake/Output from previous day: 01/21 0701 - 01/22 0700 In: 1500 [P.O.:840; I.V.:510; IV Piggyback:150] Out: 4445 [Urine:800; Drains:3550; Stool:95]  Labs:  [facility] 01/06/13 0815  WBC 6.1  HGB 8.7*  HCT 26.5*  PLT 292  APTT --  INR --     Basename 01/07/13 0430 01/06/13 0815  NA 143 139  K 3.2* 3.7  CL 112 109  CO2 19 19  GLUCOSE 106* 100*  BUN 24* 26*  CREATININE 1.08 0.99  LABCREA -- --  CREAT24HRUR -- --  CALCIUM 10.0 10.1  MG -- 1.6  PHOS -- 5.0*  PROT -- 6.8  ALBUMIN -- 2.2*  AST -- 30  ALT -- 50  ALKPHOS -- 390*  BILITOT -- 1.0  BILIDIR -- --  IBILI -- --  PREALBUMIN -- 17.1*  TRIG -- 146  CHOLHDL -- --  CHOL -- 132   Estimated Creatinine Clearance: 93.8 ml/min (by C-G formula based on Cr of 1.08).   Insulin Requirements in the past 24 hours:  Most CBG < 150, requiring 4 units SSI/24hrs, 11 units/L in TPN. (Per Copywriter, advertising med list, patient was on SSI and regular insulin in TPN at 15 units/L.)    Nutritional Goals:   RD recs(1/16):  Kcal: 2600-2800, Protein: 130-140 gm, Fluid: >2.5L/day.    TPN @ [facility] = 2624 Kcals, 140g Protein, fluid (125 ml/hr over 24 hrs)  Goal rate inpatient: Clinimix E5/20 at 142ml/hr + Lipids MWF to provide Protein 138gm and 2909 kcal MWF, 2429 Kcal TTSS for weekly avg of 2634 kcal.  Current Nutrition:   NPO (as of 12/20/12)  IVF = D5NS @ 75 ml/hr  Assessment: 89 YOM with complex history  including terminal peritoneal carcinomatosis s/p chemo.  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  Pt with LLQ drain, LUQ drain, ileostomy, choly drain, NG tube.  Pt transferred to [facility] on 12/20/11 from [facility] for SBO management, N/V, high NG output.  No further plans for chemotherapy or surgery at this time.  Pt developed hypotension and tachycardia and transferred to ICU on 1/6 with MRSA bacteremia.  ID has recommended line removal, but at this time, the pt elects to continue central lines despite risks.   Patient failed NG clamping trial 1/11, IR consulted for PEG placement.   Failed attempt at stent revision on 1/14 due to progression of tumor infiltration.   Electrolytes - K slightly low - will replace. Phos high AM 1/21  LFTs - (1/21) AlkPhos elevated and increased some  TGs - (1/13) 110, (1/21) 146  Prealbumin - (1/13) 29, (1/21) 17.1  Plan: PICC to be replaced today then:  Will resume Clinimix 5/15 at previous rate of 115 ml/hr but will not add electrolytes to formula due to elevated phos yesterday AM  Replete K.  Will continue to add 11 units/L of regular insulin in TNA.  IV fat emulsion 20% at 10 ml/hr on MWF only due to ongoing shortage.  Standard multivitamins and trace elements  on MWF only due to ongoing shortage.  Check Cmet and phosphorous in am.   Hessie Knows, PharmD, BCPS Pager [phone] 01/07/2013 11:21 AM

## 2013-01-07 NOTE — Progress Notes (Signed)
ANTIBIOTIC CONSULT NOTE - FOLLOW UP  Pharmacy Consult for Vancomycin Indication: MRSA Bacteremia   Allergies  Allergen Reactions  . Percocet (Oxycodone-Acetaminophen)     hallucination    Patient Measurements: Height: 6\' 4"  (193 cm) Weight: 204 lb (92.534 kg) IBW/kg (Calculated) : 86.8   Vital Signs: Temp: 97.2 F (36.2 C) (01/22 0452) Temp src: Axillary (01/22 0452) BP: 125/68 mmHg (01/22 0452) Pulse Rate: 82  (01/22 0452) Intake/Output from previous day: 01/21 0701 - 01/22 0700 In: 1500 [P.O.:840; I.V.:510; IV Piggyback:150] Out: 4445 [Urine:800; Drains:3550; Stool:95] Intake/Output from this shift: Total I/O In: 240 [P.O.:240] Out: 557 [Urine:150; Drains:407]  Labs:  [facility] 01/07/13 0430 01/06/13 0815  WBC -- 6.1  HGB -- 8.7*  PLT -- 292  LABCREA -- --  CREATININE 1.08 0.99   Estimated Creatinine Clearance: 93.8 ml/min (by C-G formula based on Cr of 1.08). No results found for this basename: VANCOTROUGH:2,VANCOPEAK:2,VANCORANDOM:2,GENTTROUGH:2,GENTPEAK:2,GENTRANDOM:2,TOBRATROUGH:2,TOBRAPEAK:2,TOBRARND:2,AMIKACINPEAK:2,AMIKACINTROU:2,AMIKACIN:2, in the last 72 hours   Microbiology: Recent Results (from the past 720 hour(s))  CULTURE, BLOOD (ROUTINE X 2)     Status: Normal   Collection Time   12/22/12 12:55 PM      Component Value Range Status Comment   Specimen Description BLOOD LEFT HAND   Final    Special Requests BOTTLES DRAWN AEROBIC AND ANAEROBIC [facility]   Final    Culture  Setup Time 12/22/2012 22:48   Final    Culture     Final    Value: METHICILLIN RESISTANT STAPHYLOCOCCUS AUREUS     Note: RIFAMPIN AND GENTAMICIN SHOULD NOT BE USED AS SINGLE DRUGS FOR TREATMENT OF STAPH INFECTIONS. CRITICAL RESULT CALLED TO, READ BACK BY AND VERIFIED WITH: JENNIFER FUQUAY @ 1258 12/24/12 BY KRAWS     Note: Gram Stain Report Called to,Read Back By and Verified With: Burnell Blanks @ 1254 12/23/12 BY KRAWS   Report Status 12/25/2012 FINAL   Final    Organism ID, Bacteria  METHICILLIN RESISTANT STAPHYLOCOCCUS AUREUS   Final   CULTURE, BLOOD (ROUTINE X 2)     Status: Normal   Collection Time   12/22/12  1:05 PM      Component Value Range Status Comment   Specimen Description BLOOD LEFT HAND   Final    Special Requests BOTTLES DRAWN AEROBIC AND ANAEROBIC [facility]   Final    Culture  Setup Time 12/22/2012 22:48   Final    Culture     Final    Value: STAPHYLOCOCCUS AUREUS     Note: SUSCEPTIBILITIES PERFORMED ON PREVIOUS CULTURE WITHIN THE LAST 5 DAYS.     Note: Gram Stain Report Called to,Read Back By and Verified With: KIM Richardson Dopp @ 1254 12/23/12 BY KRAWS   Report Status 12/25/2012 FINAL   Final   MRSA PCR SCREENING     Status: Abnormal   Collection Time   12/22/12  5:42 PM      Component Value Range Status Comment   MRSA by PCR POSITIVE (*) NEGATIVE Final   CULTURE, BLOOD (ROUTINE X 2)     Status: Normal   Collection Time   12/27/12  4:30 AM      Component Value Range Status Comment   Specimen Description BLOOD RIGHT PICC   Final    Special Requests BOTTLES DRAWN AEROBIC AND ANAEROBIC 5CC   Final    Culture  Setup Time 12/27/2012 13:21   Final    Culture NO GROWTH 5 DAYS   Final    Report Status 01/02/2013 FINAL   Final  CATH TIP CULTURE     Status: Normal   Collection Time   01/04/13  2:18 PM      Component Value Range Status Comment   Specimen Description CATH TIP   Final    Special Requests NONE   Final    Culture NO GROWTH 2 DAYS   Final    Report Status 01/06/2013 FINAL   Final   CULTURE, BLOOD (ROUTINE X 2)     Status: Normal (Preliminary result)   Collection Time   01/04/13  6:55 PM      Component Value Range Status Comment   Specimen Description BLOOD LEFT ANTECUBITAL   Final    Special Requests Normal BOTTLES DRAWN AEROBIC AND ANAEROBIC 5CC   Final    Culture  Setup Time 01/05/2013 14:11   Final    Culture     Final    Value:        BLOOD CULTURE RECEIVED NO GROWTH TO DATE CULTURE WILL BE HELD FOR 5 DAYS BEFORE ISSUING A FINAL NEGATIVE REPORT    Report Status PENDING   Incomplete   CULTURE, BLOOD (ROUTINE X 2)     Status: Normal (Preliminary result)   Collection Time   01/04/13  7:01 PM      Component Value Range Status Comment   Specimen Description BLOOD LEFT ANTECUBITAL   Final    Special Requests Normal BOTTLES DRAWN AEROBIC ONLY 3CC   Final    Culture  Setup Time 01/05/2013 14:10   Final    Culture     Final    Value:        BLOOD CULTURE RECEIVED NO GROWTH TO DATE CULTURE WILL BE HELD FOR 5 DAYS BEFORE ISSUING A FINAL NEGATIVE REPORT   Report Status PENDING   Incomplete     Anti-infectives     Start     Dose/Rate Route Frequency Ordered Stop   01/01/13 1200   vancomycin (VANCOCIN) 750 mg in sodium chloride 0.9 % 150 mL IVPB        750 mg 150 mL/hr over 60 Minutes Intravenous Every 12 hours 01/01/13 0417     01/01/13 0800   ceFAZolin (ANCEF) IVPB 1 g/50 mL premix        1 g 100 mL/hr over 30 Minutes Intravenous  Once 12/31/12 1657 01/01/13 0919   12/29/12 1000   vancomycin (VANCOCIN) IVPB 1000 mg/200 mL premix  Status:  Discontinued        1,000 mg 200 mL/hr over 60 Minutes Intravenous Every 12 hours 12/28/12 1849 01/01/13 0406   12/24/12 1000   vancomycin (VANCOCIN) IVPB 1000 mg/200 mL premix  Status:  Discontinued        1,000 mg 200 mL/hr over 60 Minutes Intravenous Every 8 hours 12/24/12 0836 12/28/12 1840   12/23/12 2200   vancomycin (VANCOCIN) IVPB 1000 mg/200 mL premix  Status:  Discontinued        1,000 mg 200 mL/hr over 60 Minutes Intravenous Every 12 hours 12/23/12 1040 12/24/12 0836   12/22/12 1600   vancomycin (VANCOCIN) IVPB 1000 mg/200 mL premix  Status:  Discontinued        1,000 mg 200 mL/hr over 60 Minutes Intravenous Every 8 hours 12/22/12 1539 12/23/12 1040   12/22/12 1600   piperacillin-tazobactam (ZOSYN) IVPB 3.375 g  Status:  Discontinued        3.375 g 12.5 mL/hr over 240 Minutes Intravenous Every 8 hours 12/22/12 1539 12/24/12 1441  Assessment:  57 YO M with h/o terminal  peritoneal carcinomatosis s/p chemo. Pt developed hypotension and tachycardia 1/6 with possible sepsis (cholangitis after biliary drain manipulation) now with +MRSA  Day #17 Vancomycin   SCr still WNL, afebrile  PICC to be replaced today then 6 more weeks of vanc per ID  Goal of Therapy:  Vancomycin trough level 15-20 mcg/ml Eradication of infection  Plan:  1) Continue Vancomycin 750mg  IV q12 for now 2) With patient hopefully being discharged soon, will order another vanc trough tonight to make sure patient is on correct dose upon discharge  Hessie Knows, PharmD, BCPS Pager [phone] 01/07/2013 11:32 AM

## 2013-01-08 ENCOUNTER — Inpatient Hospital Stay (HOSPITAL_COMMUNITY): Payer: 59

## 2013-01-08 LAB — GLUCOSE, CAPILLARY
Glucose-Capillary: 115 mg/dL — ABNORMAL HIGH (ref 70–99)
Glucose-Capillary: 115 mg/dL — ABNORMAL HIGH (ref 70–99)
Glucose-Capillary: 107 mg/dL — ABNORMAL HIGH (ref 70–99)

## 2013-01-08 LAB — COMPREHENSIVE METABOLIC PANEL
ALT: 43 U/L (ref 0–53)
Albumin: 2.2 g/dL — ABNORMAL LOW (ref 3.5–5.2)
Alkaline Phosphatase: 375 U/L — ABNORMAL HIGH (ref 39–117)
BUN: 28 mg/dL — ABNORMAL HIGH (ref 6–23)
Chloride: 109 mEq/L (ref 96–112)
Potassium: 3.2 mEq/L — ABNORMAL LOW (ref 3.5–5.1)
Sodium: 138 mEq/L (ref 135–145)
Total Bilirubin: 0.7 mg/dL (ref 0.3–1.2)
Total Protein: 6.9 g/dL (ref 6.0–8.3)

## 2013-01-08 MED ORDER — KETOROLAC TROMETHAMINE 15 MG/ML IJ SOLN: 15.0000 mg | Freq: Once | INTRAMUSCULAR | Status: DC

## 2013-01-08 MED ORDER — INSULIN REGULAR HUMAN 100 UNIT/ML IJ SOLN
INTRAMUSCULAR | Status: DC
  Administered 2013-01-08: 18:00:00 via INTRAVENOUS
  Filled 2013-01-08: qty 2760

## 2013-01-08 MED ORDER — UNABLE TO FIND: Status: DC

## 2013-01-08 MED ORDER — KETOROLAC TROMETHAMINE 15 MG/ML IJ SOLN
15.0000 mg | Freq: Four times a day (QID) | INTRAMUSCULAR | Status: AC
  Administered 2013-01-08 (×2): 15 mg via INTRAVENOUS
  Filled 2013-01-08 (×2): qty 1

## 2013-01-08 MED ORDER — VANCOMYCIN HCL IN DEXTROSE 1-5 GM/200ML-% IV SOLN
1000.0000 mg | INTRAVENOUS | Status: DC
  Administered 2013-01-08 – 2013-01-09 (×2): 1000 mg via INTRAVENOUS
  Filled 2013-01-08 (×2): qty 200

## 2013-01-08 MED ORDER — POTASSIUM CHLORIDE 10 MEQ/100ML IV SOLN
10.0000 meq | INTRAVENOUS | Status: AC
  Administered 2013-01-08 (×3): 10 meq via INTRAVENOUS
  Filled 2013-01-08 (×3): qty 100

## 2013-01-08 NOTE — Progress Notes (Signed)
NUTRITION FOLLOW UP  Intervention:   - TPN per pharmacy, recommend adjust as needed to meet >/= 90% estimated calorie needs - Diet per MD/GI - Will continue to monitor   Nutrition Dx:   Inadequate oral intake - ongoing  Goal:   1. Parenteral nutrition; continued tolerance - met  2. Wt/wt change; monitor trends - wt stable  3. 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 Pt has been on TPN since September 2013.   Patient is receiving TPN with Clinimix E 5/15 @ 115 ml/hr. Lipids (20% IVFE @ 10 ml/hr), multivitamins, and trace elements are provided 3 times weekly (MWF) due to national backorder. Provides 2166 kcal and 138 grams protein daily (based on weekly average). Meets 83% minimum estimated kcal and 100% minimum estimated protein needs.   Pt had PEG tube placed 1/16 for decompression. Pt had PICC line removed 1/19 and blood cultures ordered with plans to place PICC if cultures negative. PICC line placed 1/22. GI met with pt on 1/20 with recommendations for G tube to gravity and clear liquid diet. Plan is for discharge to home with hospice.   Noted pt with low potassium, getting replaced.  CBGs within goal range <150 mg/dL Alk phos elevated but relatively stable PALB down from 29 mg/dL on 1/61 to 09.6 mg/dL on 0/45  Height: Ht Readings from Last 1 Encounters:  12/30/12 6\' 4"  (1.93 m)    Weight Status:   Wt Readings from Last 1 Encounters:  12/30/12 204 lb (92.534 kg)    Re-estimated needs:  Kcal: 2600-2800  Protein: 130-140g  Fluid: >2.5L/day   Skin: Intact   Diet Order: Clear Liquid   Intake/Output Summary (Last 24 hours) at 01/08/13 1552 Last data filed at 01/08/13 1300  Gross per 24 hour  Intake    600 ml  Output   4150 ml  Net   -3550 ml    Last BM: 1/18   Labs:   Lab 01/08/13 0440 01/07/13 0430 01/06/13 0815 01/04/13 0640 01/01/13 1851  NA 138 143 139 -- --  K 3.2* 3.2* 3.7 -- --  CL 109 112 109 -- --  CO2 19 19 19  -- --  BUN 28* 24* 26* -- --  CREATININE 1.04 1.08 0.99 -- --  CALCIUM 9.6 10.0 10.1 -- --  MG 1.5 -- 1.6 -- 1.8  PHOS 4.0 -- 5.0* 4.5 --  GLUCOSE 112* 106* 100* -- --    CBG (last 3)   Basename 01/08/13 1228 01/08/13 0605 01/07/13 2356  GLUCAP 115* 115* 98    Scheduled Meds:   . antiseptic oral rinse  15 mL Mouth Rinse BID  . enoxaparin (LOVENOX) injection  40 mg Subcutaneous Q24H  . insulin aspart  0-9 Units Subcutaneous Q6H  . ketorolac  15 mg Intravenous Q6H  . lip balm  1 application Topical BID  . metoCLOPramide  10 mg Intravenous QID  . pantoprazole  40 mg Intravenous Q12H  . sodium chloride  10-40 mL Intracatheter Q12H  . sodium chloride  10-40 mL Intracatheter Q12H  . vancomycin  1,000 mg Intravenous Q24H    Continuous Infusions:   . sodium chloride 100 mL/hr at 01/08/13 0442  . dextrose 5 % and 0.9% NaCl 75 mL/hr at 01/07/13 0645  . TPN (CLINIMIX) +/-  additives 115 mL/hr at 01/07/13 1858   And  . fat emulsion 250 mL (01/07/13 1858)  . TPN [facility]) +/- additives      Levon Hedger MS, RD, LDN [phone] Pager [phone] After Hours Pager

## 2013-01-08 NOTE — Progress Notes (Signed)
Vanc trough elevated, Pharmacy notified. Dose changed.

## 2013-01-08 NOTE — Progress Notes (Signed)
Called pharmacy regarding which fluid order to use along side TPN, advised to use NS.

## 2013-01-08 NOTE — Progress Notes (Signed)
PT Cancellation Note  Patient Details Name: [name] MRN: [id number] DOB: 1956/10/13   Cancelled Treatment:    Reason Eval/Treat Not Completed: Other (comment) (pt sleeping this am, this pt awaiting a family meeting)   [name] 01/08/2013, 2:28 PM

## 2013-01-08 NOTE — Progress Notes (Addendum)
PARENTERAL NUTRITION CONSULT NOTE - Follow-Up  Pharmacy Consult for TPN  Indication: SBO secondary to peritoneal carcinomatosis  Allergies  Allergen Reactions  . Percocet (Oxycodone-Acetaminophen)     hallucination   Patient Measurements: Height: 6\' 4"  (193 cm) Weight: 204 lb (92.534 kg) IBW/kg (Calculated) : 86.8   Vital Signs: Temp: 98 F (36.7 C) (01/23 0600) Temp src: Oral (01/23 0600) BP: 118/70 mmHg (01/23 0600) Pulse Rate: 76  (01/23 0600) Intake/Output from previous day: 01/22 0701 - 01/23 0700 In: 1320 [P.O.:1320] Out: 4307 [Urine:475; Drains:3707; Stool:125]  Labs:  [facility] 01/06/13 0815  WBC 6.1  HGB 8.7*  HCT 26.5*  PLT 292  APTT --  INR --     Basename 01/08/13 0440 01/07/13 0430 01/06/13 0815  NA 138 143 139  K 3.2* 3.2* 3.7  CL 109 112 109  CO2 19 19 19   GLUCOSE 112* 106* 100*  BUN 28* 24* 26*  CREATININE 1.04 1.08 0.99  LABCREA -- -- --  CREAT24HRUR -- -- --  CALCIUM 9.6 10.0 10.1  MG 1.5 -- 1.6  PHOS 4.0 -- 5.0*  PROT 6.9 -- 6.8  ALBUMIN 2.2* -- 2.2*  AST 25 -- 30  ALT 43 -- 50  ALKPHOS 375* -- 390*  BILITOT 0.7 -- 1.0  BILIDIR -- -- --  IBILI -- -- --  PREALBUMIN -- -- 17.1*  TRIG -- -- 146  CHOLHDL -- -- --  CHOL -- -- 132   Estimated Creatinine Clearance: 97.4 ml/min (by C-G formula based on Cr of 1.04).   Insulin Requirements in the past 24 hours:  Most CBG < 150, requiring 4 units SSI/24hrs, 11 units/L in TPN. (Per Copywriter, advertising med list, patient was on SSI and regular insulin in TPN at 15 units/L.)    Nutritional Goals:   RD recs(1/16):  Kcal: 2600-2800, Protein: 130-140 gm, Fluid: >2.5L/day.    TPN @ [facility] = 2624 Kcals, 140g Protein, fluid (125 ml/hr over 24 hrs)  Goal rate inpatient: Clinimix E5/20 at 110ml/hr + Lipids MWF to provide Protein 138gm and 2909 kcal MWF, 2429 Kcal TTSS for weekly avg of 2634 kcal.  Current Nutrition:   NPO (as of 12/20/12)  IVF = D5NS @ 75 ml/hr  Assessment: 54 YOM  with complex history including terminal peritoneal carcinomatosis s/p chemo.  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 Pt with LLQ drain, LUQ drain, ileostomy, choly drain, NG tube.  Pt transferred to [facility] on 12/20[facility]11 from [facility] for SBO management, N/V, high NG output and TNA was started.  Pt developed hypotension and tachycardia and transferred to ICU on 1/6 with MRSA bacteremia.  ID recommended line removal, but pt initially refused but finally agreed on D/C PICC and [facility] 1/19. PICC replaced 1/22 and TNA resumed.    Electrolytes - K slightly low - will replace. Phos high AM 1/21  CBG: within goal range <150  LFTs - (1/21) AlkPhos elevated and increased some  TGs - (1/13) 110, (1/21) 146  Prealbumin - (1/13) 29, (1/21) 17.1  Plan:  Continue Clinimix 5/15 (electrolyte free formula) at 115 ml/hr   Replete K.  Continue 11 units/L of regular insulin in TNA.  IV fat emulsion 20% at 10 ml/hr on MWF only due to ongoing shortage.  Standard multivitamins and trace elements on MWF only due to ongoing shortage.  Gwen Her PharmD  [phone] 01/08/2013 7:18 AM

## 2013-01-08 NOTE — Progress Notes (Addendum)
ANTIBIOTIC CONSULT NOTE - FOLLOW UP  Pharmacy Consult for Vancomycin Indication: MRSA Bacteremia   Allergies  Allergen Reactions  . Percocet (Oxycodone-Acetaminophen)     hallucination    Patient Measurements: Height: 6\' 4"  (193 cm) Weight: 204 lb (92.534 kg) IBW/kg (Calculated) : 86.8   Vital Signs: Temp: 97.5 F (36.4 C) (01/22 2055) Temp src: Oral (01/22 2055) BP: 125/81 mmHg (01/22 2055) Pulse Rate: 70  (01/22 2055) Intake/Output from previous day: 01/22 0701 - 01/23 0700 In: 960 [P.O.:960] Out: 2882 [Urine:150; Drains:2607; Stool:125] Intake/Output from this shift: Total I/O In: -  Out: 1125 [Drains:1000; Stool:125]  Labs:  [facility] 01/07/13 0430 01/06/13 0815  WBC -- 6.1  HGB -- 8.7*  PLT -- 292  LABCREA -- --  CREATININE 1.08 0.99   Estimated Creatinine Clearance: 93.8 ml/min (by C-G formula based on Cr of 1.08).  Basename 01/07/13 2355  VANCOTROUGH 21.6*  VANCOPEAK --  Drue Dun --  GENTTROUGH --  GENTPEAK --  GENTRANDOM --  TOBRATROUGH --  TOBRAPEAK --  TOBRARND --  AMIKACINPEAK --  AMIKACINTROU --  AMIKACIN --     Microbiology: Recent Results (from the past 720 hour(s))  CULTURE, BLOOD (ROUTINE X 2)     Status: Normal   Collection Time   12/22/12 12:55 PM      Component Value Range Status Comment   Specimen Description BLOOD LEFT HAND   Final    Special Requests BOTTLES DRAWN AEROBIC AND ANAEROBIC [facility]   Final    Culture  Setup Time 12/22/2012 22:48   Final    Culture     Final    Value: METHICILLIN RESISTANT STAPHYLOCOCCUS AUREUS     Note: RIFAMPIN AND GENTAMICIN SHOULD NOT BE USED AS SINGLE DRUGS FOR TREATMENT OF STAPH INFECTIONS. CRITICAL RESULT CALLED TO, READ BACK BY AND VERIFIED WITH: JENNIFER FUQUAY @ 1258 12/24/12 BY KRAWS     Note: Gram Stain Report Called to,Read Back By and Verified With: Burnell Blanks @ 1254 12/23/12 BY KRAWS   Report Status 12/25/2012 FINAL   Final    Organism ID, Bacteria METHICILLIN RESISTANT STAPHYLOCOCCUS AUREUS    Final   CULTURE, BLOOD (ROUTINE X 2)     Status: Normal   Collection Time   12/22/12  1:05 PM      Component Value Range Status Comment   Specimen Description BLOOD LEFT HAND   Final    Special Requests BOTTLES DRAWN AEROBIC AND ANAEROBIC [facility]   Final    Culture  Setup Time 12/22/2012 22:48   Final    Culture     Final    Value: STAPHYLOCOCCUS AUREUS     Note: SUSCEPTIBILITIES PERFORMED ON PREVIOUS CULTURE WITHIN THE LAST 5 DAYS.     Note: Gram Stain Report Called to,Read Back By and Verified With: KIM Richardson Dopp @ 1254 12/23/12 BY KRAWS   Report Status 12/25/2012 FINAL   Final   MRSA PCR SCREENING     Status: Abnormal   Collection Time   12/22/12  5:42 PM      Component Value Range Status Comment   MRSA by PCR POSITIVE (*) NEGATIVE Final   CULTURE, BLOOD (ROUTINE X 2)     Status: Normal   Collection Time   12/27/12  4:30 AM      Component Value Range Status Comment   Specimen Description BLOOD RIGHT PICC   Final    Special Requests BOTTLES DRAWN AEROBIC AND ANAEROBIC 5CC   Final    Culture  Setup Time  12/27/2012 13:21   Final    Culture NO GROWTH 5 DAYS   Final    Report Status 01/02/2013 FINAL   Final   CATH TIP CULTURE     Status: Normal   Collection Time   01/04/13  2:18 PM      Component Value Range Status Comment   Specimen Description CATH TIP   Final    Special Requests NONE   Final    Culture NO GROWTH 2 DAYS   Final    Report Status 01/06/2013 FINAL   Final   CULTURE, BLOOD (ROUTINE X 2)     Status: Normal (Preliminary result)   Collection Time   01/04/13  6:55 PM      Component Value Range Status Comment   Specimen Description BLOOD LEFT ANTECUBITAL   Final    Special Requests Normal BOTTLES DRAWN AEROBIC AND ANAEROBIC 5CC   Final    Culture  Setup Time 01/05/2013 14:11   Final    Culture     Final    Value:        BLOOD CULTURE RECEIVED NO GROWTH TO DATE CULTURE WILL BE HELD FOR 5 DAYS BEFORE ISSUING A FINAL NEGATIVE REPORT   Report Status PENDING   Incomplete   CULTURE,  BLOOD (ROUTINE X 2)     Status: Normal (Preliminary result)   Collection Time   01/04/13  7:01 PM      Component Value Range Status Comment   Specimen Description BLOOD LEFT ANTECUBITAL   Final    Special Requests Normal BOTTLES DRAWN AEROBIC ONLY 3CC   Final    Culture  Setup Time 01/05/2013 14:10   Final    Culture     Final    Value:        BLOOD CULTURE RECEIVED NO GROWTH TO DATE CULTURE WILL BE HELD FOR 5 DAYS BEFORE ISSUING A FINAL NEGATIVE REPORT   Report Status PENDING   Incomplete     Anti-infectives     Start     Dose/Rate Route Frequency Ordered Stop   01/01/13 1200   vancomycin (VANCOCIN) 750 mg in sodium chloride 0.9 % 150 mL IVPB        750 mg 150 mL/hr over 60 Minutes Intravenous Every 12 hours 01/01/13 0417     01/01/13 0800   ceFAZolin (ANCEF) IVPB 1 g/50 mL premix        1 g 100 mL/hr over 30 Minutes Intravenous  Once 12/31/12 1657 01/01/13 0919   12/29/12 1000   vancomycin (VANCOCIN) IVPB 1000 mg/200 mL premix  Status:  Discontinued        1,000 mg 200 mL/hr over 60 Minutes Intravenous Every 12 hours 12/28/12 1849 01/01/13 0406   12/24/12 1000   vancomycin (VANCOCIN) IVPB 1000 mg/200 mL premix  Status:  Discontinued        1,000 mg 200 mL/hr over 60 Minutes Intravenous Every 8 hours 12/24/12 0836 12/28/12 1840   12/23/12 2200   vancomycin (VANCOCIN) IVPB 1000 mg/200 mL premix  Status:  Discontinued        1,000 mg 200 mL/hr over 60 Minutes Intravenous Every 12 hours 12/23/12 1040 12/24/12 0836   12/22/12 1600   vancomycin (VANCOCIN) IVPB 1000 mg/200 mL premix  Status:  Discontinued        1,000 mg 200 mL/hr over 60 Minutes Intravenous Every 8 hours 12/22/12 1539 12/23/12 1040   12/22/12 1600   piperacillin-tazobactam (ZOSYN) IVPB 3.375 g  Status:  Discontinued  3.375 g 12.5 mL/hr over 240 Minutes Intravenous Every 8 hours 12/22/12 1539 12/24/12 1441          Assessment:  57 YO M with h/o terminal peritoneal carcinomatosis s/p chemo. Pt  developed hypotension and tachycardia 1/6 with possible sepsis (cholangitis after biliary drain manipulation) now with +MRSA  Day #18 Vancomycin   SCr still WNL, afebrile  PICC to be replaced today then 6 more weeks of vanc per ID  VT=21.6 after 750mg  q12h @ Css  Just slightly above goal.  Goal of Therapy:  Vancomycin trough level 15-20 mcg/ml Eradication of infection  Plan:  1) Change Vancomycin to 1Gm IV q24h  Lorenza Evangelist Pager [phone] 01/08/2013 1:14 AM

## 2013-01-08 NOTE — Progress Notes (Signed)
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 [name] in [facility] which has left him with multiple drains and fistulas. He also had recent cholecystitis. [facility]. 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 A duodenal stent was placed by GI during second attempt. He however continued to be obstructed. He was transferred to [facility] on 1/3 for evaluation and management. Since hospitalization, he has continued to have copious NG tube drainage. Surgeons and GI do not see any surgical/intervention options. Oncology does not recommend any further chemotherapy at this time. Patient in the interim developed septic shock from MRSA bacteremia/likely line sepsis. Palliative team consulted for goals of care-patient wishes to continue aggressive course.  Assessment/Plan:  MRSA bacteremia  -? Line sepsis (has PICC line and Port-A-Cath): Less likely from Cholangitis.  - currently on vancomycin. Patient was transferred to ICU for hypotension and tachycardia. - ID recommended removal of PICC line & Port-A-Cath, line holiday , check repeat blood culture and prolonged IV antibiotics. Blood cultures were 2 x 2 positive for MRSA. Repeat blood cultures 1/11 negative..  Patient finally agreed on getting hospice involved on Dr [name] 's request.  -removed PICC line and Port-A-Cath on 1/19 ( finally after patient agreed) thrombocytopenia have resolved. H/h stable.  -PICC line placed 01/07/13 -Discussed with ID--plan 6 weeks of intravenous vancomycin with the start date of 01/04/2013  Hypokalemia and hypomagnesemia:    Pharmacy replacing potassium and magnesium. Improved.  Spasms  spams of ribs , abdomen and legs. Improved with increased dose of Robaxin and low-dose morphine  Hyponatremia  Likely with hypovolemia. Improved with IV hydration  .  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 Patient has had multiple surgical procedures . Palliative consult appreciated .  Patient wishes to be partial code, wants CPR but no intubation or mechanical ventilation DNI  Small bowel obstruction/duodenal obstruction despite stent:  Currently conservative management-n.p.o., and TPN. Failed NGT clamping trial after 5-1/2 hours on 1/11.. Failed attempt at stent revision on 1/14 - progression of tumor infiltration . GI have no further recommendations.  Successful gastrostomy tube placed by IR on 1/16 . Was attached to wall suction , was uncomfortable on placing to gravity. Hospice will arrange for suction pump at home as needed  -Cholecystitis status post cholecystostomy drain placed at [facility]  - IR performed cholecystostomy tube change on 1/6.  Left Elbow pain/bursitis -xray left elbow -trial of toradol  Code Status: partial ( DNI but not DNR)  Family Communication: Discussed with patient  Disposition Plan:  Patient is very hopeful about getting better and repeatedly requests on getting opinion is at different centers including [facility] and [facility] for possible transfer. I discussed this periodically with him that chemotherapy is not an option at this time and his surgeon Dr. [name] at [facility] also did not suggest the need for any surgery at this time. He was seen by his oncologist [name] again on 1/20 who recommended that he is not a candidate for any further treatment or clinical studies given his advanced disease, multiple complications and poor functional status. He wishes to seek consultation at  different  facility once being discharged from here. At present he agrees to go home with hospice followup.  Patient's PICC line and Port-A-Cath was removed on 1/19 and and ordered repeat blood culture which is negative for growth at 48 hours. I will order for a PICC line placement and resume his TPN that is currently being held.  -Patient will then be discharged home on TPN and IV vancomycin with home hospice followup. TPN resumed on 1/22  Consultants:  Oncology   GI  IR  General surgery.  Critical care medicine.  Palliative care medicine  ID Procedures:  NG tube  Change of cholecystostomy tube by IR on 1/6  Diagnostic small bowel enteroscopy 1/14  IR gastrostomy on 1/16  PICC reinsertion ( 1/21-122) Antibiotics:  IV vancomycin 1/6 >  IV Zosyn 1/6 > 1/8    Family Communication:   Pt at beside Disposition Plan:   Home when medically stable with hospice care      Procedures/Studies: Dg Abd 1 View - Kub  12/11/2012  *RADIOLOGY REPORT*  Clinical Data: Duodenal stricture.  Attempted endoscopy.  DG C-ARM GT 120 MIN,ABDOMEN - 1 VIEW  Technique: 20 minutes and 33 seconds of fluoroscopy was utilized.  Comparison:  Upper GI 12/09/2012.  Findings: Three images are submitted.  The biliary drain and abdominal drain are in place.  The endoscope remains in the stomach, all three images.  IMPRESSION:  1.  Intraoperative fluoroscopy for guidance of endoscopy.   Original Report Authenticated By: Marin Roberts, M.D.    Ct Abdomen Pelvis W Contrast  12/16/2012  *RADIOLOGY REPORT*  Clinical Data: Small bowel obstruction  CT ABDOMEN AND PELVIS WITH CONTRAST  Technique:  Multidetector CT imaging of the abdomen and pelvis was performed following the standard protocol during bolus administration of intravenous contrast.  Contrast: 80mL OMNIPAQUE IOHEXOL 300 MG/ML  SOLN  Comparison: Abdominal x-ray is of multiple recent base.  Findings: Images which include the lower chest shows small bilateral pleural  effusions with bibasilar collapse / consolidation.  NG tube tip is noted in the mid stomach.  There appears to be some wall thickening in the distal stomach, but this may be related to the underdistended state.  Duodenum is opacified.  There is a stent in the proximal jejunum.  Contrast material enters the proximal end of the stent and a very thin string of contrast can be seen to track through the stent lumen in the small bowel beyond the stent.  There is soft tissue or debris filling the stent lumen creating only is a very thin channel for flow of contrast through the stent.  Small bowel loops distal to the stent are decompressed.  There is a small amount of free fluid in the abdomen. Bowel anatomy is difficult to discern given the fairly substantial amount of adhesions and scarring in this patient with peritoneal carcinomatosis.  There is a right lower quadrant stoma, presumably ileostomy.  The patient appears to be status post omentectomy and probably right hemicolectomy.  A small bowel anastomoses is seen in the right abdomen.  No focal abnormalities seen in the liver or spleen.  Pancreas is unremarkable.  The adrenal glands are normal.  No evidence for hydronephrosis.  Probable cyst noted in the right kidney.  No abdominal aortic aneurysm.  Pigtail catheter is seen coiled in the anterior midline in or just deep to the rectus sheath.  A second to pigtail catheter in the right upper quadrant adjacent to the gallbladder fundus is consistent with  a reported history of cholecystostomy.  The gallbladder is decompressed.  Surgical drain is positioned in the left paracolic gutter.  Imaging through the pelvis shows enlarged prostate gland.  The bladder is decompressed.  Left colon is decompressed.  Bone windows reveal no worrisome lytic or sclerotic osseous lesions.  IMPRESSION: The proximal jejunal stent device is patent although only of very thin string of contrast material passes through the stent into the  decompressed small bowel distal to the stent. Even with the stent in place, this segment of bowel appears to represent a region of luminal stenosis.  The duodenum proximal to the stent is distended.  Distal stomach appears to have abnormal circumferential wall thickening with some luminal compromise, but no outlet obstruction at this time.   Original Report Authenticated By: Kennith Center, M.D.    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  The patient is also status post prior percutaneous cholecystostomy tube placement for acalculous cholecystitis.  Assessment of the cholecystostomy tube is performed.  1.  CHOLANGIOGRAM THROUGH PREEXISTING CATHETER 2.  PERCUTANEOUS CHOLECYSTOSTOMY TUBE EXCHANGE  Contrast:  50 ml Omnipaque-300  Fluoroscopy Time: 7.5 minutes.  Procedure:  The procedure, risks, benefits, and alternatives were explained to the patient.  Questions regarding the procedure were encouraged and answered.  The patient understands and consents to the procedure.  The preexisting percutaneous cholecystostomy tube was prepped with Betadine in a sterile fashion, and a sterile drape was applied covering the operative field.  A sterile gown and sterile gloves were used for the procedure.  The preexisting catheter was injected with contrast material under fluoroscopy.  It was then removed over a guidewire.  A 5-French catheter was then introduced over a guidewire and advanced into the lumen of the gallbladder.  Additional contrast injection was performed.  The catheter was further advanced through the cystic duct and into the common bile duct and additional cholangiogram performed through the catheter.  The catheter was then retracted into the lumen of the gallbladder.  A new 10-French multipurpose drainage catheter was advanced over a wire and formed in the gallbladder lumen.  Final  catheter position was confirmed with a fluoroscopic spot image obtained after injection of contrast.  The catheter was secured at the skin with a Prolene retention suture and Stat-Lock device.  The catheter was connected to a gravity drainage bag.  Complications:  None  Findings:  Injection of a preexisting drain shows the catheter to lie just outside the lumen of the gallbladder.  Contrast injection does partially fill the gallbladder lumen and demonstrates a significant amount of internal debris and sludge within the gallbladder.  After removal of the preexisting catheter, a percutaneous tract to the gallbladder lumen was able to be catheterized.  Cholangiogram shows very slow and sluggish flow of contrast via the cystic duct into the common bile duct.  A significant amount of debris is present extending into the cystic duct and also within the distal aspect of the common bile duct.  With a catheter further advanced into the CBD, contrast injection does show a patent common bile duct with flow of contrast noted via the ampulla into the duodenum.  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  There is a fistula demonstrated to adjacent small bowel with eventual opacification of several right sided small bowel loops demonstrating poor motility.  An additional fistula extends medially into a space adjacent to the descending duodenum.  Given demonstration of fistulas as well as poor flow of contrast from the gallbladder into the common bile duct, a new cholecystostomy tube was placed and formed at the level of the gallbladder lumen.  This will be left to gravity drainage.  IMPRESSION: The preexisting catheter lies just outside the lumen of the gallbladder.  After recanalizing a tract to the gallbladder lumen, contrast injection demonstrates significant debris in the lumen of the gallbladder and poor flow of contrast into the common bile duct.  Debris  was also present in the distal aspect of the CBD. The study also demonstrated complex fistulas communicating with the gallbladder lumen and opacifying both small bowel as well as a tract to a cavity lying adjacent to the descending duodenum.  A new 10-French catheter was formed in the gallbladder and will be left to gravity drainage.   Original Report Authenticated By: Irish Lack, M.D.    Ir Gastrostomy Tube Mod Sed  01/01/2013  *RADIOLOGY REPORT*  PULL THROUGH GASTROSTOMY TUBE PLACEMENT UNDER FLUOROSCOPIC GUIDANCE  Date: 01/01/2013  Clinical History: 57 year old male with terminal peritoneal carcinomatosis and jejunal obstruction.  He requires a diverting gastrostomy tube for palliation.  Procedures Performed:  1.  Fluoroscopically guided placement of percutaneous pull-through gastrostomy tube.  Interventional Radiologist:  Sterling Big, MD  Sedation: Moderate (conscious) sedation was used.  To the mg Versed, 100 mcg Fentanyl were administered intravenously.  The patient's vital signs were monitored continuously by radiology nursing throughout the procedure.  Sedation Time: 30 minutes  Fluoroscopy time: 4.8 minutes  Contrast volume: 10 ml Omnipaque-300 administered into the stomach  PROCEDURE/FINDINGS:   Informed consent was obtained from the patient following explanation of the procedure, risks, benefits and alternatives. The patient understands, agrees and consents for the procedure. All questions were addressed. A time out was performed.  Maximal barrier sterile technique utilized including caps, mask, sterile gowns, sterile gloves, large sterile drape, hand hygiene, and chlorhexadine skin prep.  An angled catheter was advanced over a wire under fluoroscopic guidance through the nose, down the esophagus and into the body of the stomach.  The stomach was then insufflated with several 100 ml of air.  Fluoroscopy confirmed location of the gastric bubble, as well as inferior displacement of the barium  stained colon.  Under direct fluoroscopic guidance, a single T-tack was placed, and the anterior gastric wall drawn up against the anterior abdominal wall. Percutaneous access was then obtained into the mid gastric body with an 18 gauge sheath needle.  Aspiration of air, and injection of contrast material under fluoroscopy confirmed needle placement.  An Amplatz wire was advanced in the gastric body.  The tract was then serially dilated to 12-French.  A 12 [place] Wills-Oglesby decompressive gastrostomy tube was then advanced over the wire and positioned within the gastric body.  A gentle hand injection of contrast material confirmed intragastric location.  This single T tack was cut.  The tube was secured to the skin with both Prolene suture.  There is no immediate complication, the patient tolerated the procedure well.  The patient will be observed for several hours with the newly placed tube on low wall suction to evaluate for any post procedure complication.  IMPRESSION:  Successful placement of a 61 French decompressive gastrostomy tube. The tube is designed for decompression only and is not designed for administration of nutrition or medications.  Connect to low wall suction as needed.  Signed,  Sterling Big, MD Vascular & Interventional Radiologist [facility] Radiology   Original Report Authenticated By: Malachy Moan, M.D.    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  The patient is also status post prior percutaneous cholecystostomy tube placement for acalculous cholecystitis.  Assessment of the cholecystostomy tube is performed.  1.  CHOLANGIOGRAM THROUGH PREEXISTING CATHETER 2.  PERCUTANEOUS CHOLECYSTOSTOMY TUBE EXCHANGE  Contrast:  50 ml Omnipaque-300  Fluoroscopy Time: 7.5 minutes.  Procedure:  The procedure, risks, benefits, and alternatives were explained to the  patient.  Questions regarding the procedure were encouraged and answered.  The patient understands and consents to the procedure.  The preexisting percutaneous cholecystostomy tube was prepped with Betadine in a sterile fashion, and a sterile drape was applied covering the operative field.  A sterile gown and sterile gloves were used for the procedure.  The preexisting catheter was injected with contrast material under fluoroscopy.  It was then removed over a guidewire.  A 5-French catheter was then introduced over a guidewire and advanced into the lumen of the gallbladder.  Additional contrast injection was performed.  The catheter was further advanced through the cystic duct and into the common bile duct and additional cholangiogram performed through the catheter.  The catheter was then retracted into the lumen of the gallbladder.  A new 10-French multipurpose drainage catheter was advanced over a wire and formed in the gallbladder lumen.  Final catheter position was confirmed with a fluoroscopic spot image obtained after injection of contrast.  The catheter was secured at the skin with a Prolene retention suture and Stat-Lock device.  The catheter was connected to a gravity drainage bag.  Complications:  None  Findings:  Injection of a preexisting drain shows the catheter to lie just outside the lumen of the gallbladder.  Contrast injection does partially fill the gallbladder lumen and demonstrates a significant amount of internal debris and sludge within the gallbladder.  After removal of the preexisting catheter, a percutaneous tract to the gallbladder lumen was able to be catheterized.  Cholangiogram shows very slow and sluggish flow of contrast via the cystic duct into the common bile duct.  A significant amount of debris is present extending into the cystic duct and also within the distal aspect of the common bile duct.  With a catheter further advanced into the CBD, contrast injection does show a patent  common bile duct with flow of contrast noted via the ampulla into the duodenum.  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  There is a fistula demonstrated to adjacent small bowel with eventual opacification of several right sided small bowel loops demonstrating poor motility.  An additional fistula extends medially into a space adjacent to the descending duodenum.  Given demonstration of fistulas as well as poor flow of contrast from the gallbladder into the common bile duct, a new cholecystostomy tube was placed and formed at the level of the gallbladder lumen.  This will be left to gravity drainage.  IMPRESSION: The preexisting catheter lies just outside the lumen of the gallbladder.  After recanalizing a tract to the gallbladder lumen, contrast injection demonstrates significant debris in the lumen of the gallbladder and poor flow of contrast into the common bile duct.  Debris was also present in the distal aspect of the CBD. The study also demonstrated complex  fistulas communicating with the gallbladder lumen and opacifying both small bowel as well as a tract to a cavity lying adjacent to the descending duodenum.  A new 10-French catheter was formed in the gallbladder and will be left to gravity drainage.   Original Report Authenticated By: Irish Lack, M.D.    Dg Sinus/fist Tube Chk-non Gi  12/22/2012  *RADIOLOGY REPORT*  Clinical Data:Evaluate for fistula  ABSCESS INJECTION  Fluoroscopy Time: 1.09 minutes  Comparison: CT of the abdomen and pelvis dated 12/16/2012  Findings: Scout radiograph was obtained.  This shows a nasogastric tube within the stomach.  There is a metallic stent identified within the proximal jejunum.  JP drain overlies the left lower quadrant of the abdomen there is a percutaneous cholecystostomy tube within the right abdomen.  Left lower quadrant the JP drain was accessed and 40 ml of omni 300 was injected into the  drain.  No significant extravasation of contrast material from the draining identified to suggest residual abscess or fistula formation.  Mild intra vascular extravasation with opacification of the left lower quadrant venous structures noted.  IMPRESSION: 1.  No evidence for residual abscess or fistula formation surrounding the left lower quadrant JP drain.  Findings were discussed with Dr. Michaell Cowing at the time the procedure was performed on 12/22/2012.   Original Report Authenticated By: Signa Kell, M.D.    Dg Chest Port 1 View  12/22/2012  *RADIOLOGY REPORT*  Clinical Data: Chills.  Shortness of breath.  PORTABLE CHEST - 1 VIEW  Comparison: 11/27/2012  Findings: Left Port-A-Cath and right PICC line remain in place, unchanged.  NG tube has been placed into the stomach.  Lungs are clear.  No effusions.  Heart is normal size.  IMPRESSION: No acute cardiopulmonary disease.   Original Report Authenticated By: Charlett Nose, M.D.    Dg Ercp  12/13/2012  *RADIOLOGY REPORT*  Clinical Data: Stenotic small bowel segment just distal to the ligament of Treitz.  ERCP  Comparison:  Small bowel series from 12/09/2012  Technique:  Multiple spot images obtained with the fluoroscopic device and submitted for interpretation post-procedure.  ERCP was performed by Dr.  Arlyce Dice.  Findings: Four spot fluoro images are submitted.  I discussed this study by telephone with Dr. Arlyce Dice immediately after the procedure was completed.  The first image demonstrates a small bowel stent over the left abdomen.  The second image shows injection of contrast material which opacifies the dilated loop just proximal to the intraluminal stent. The stent is opacified throughout its entire length and there does appear to be contrast draining from the distal end of the stent into nondilated small bowel.  The third image again shows contrast in the dilated loop proximal to the stent with opacification of the stent lumen down to the distal end of the stent.   The fourth image shows contrast material in the dilated small bowel loop just proximal to the stent.  There is contrast within the stent device with a small amount of contrast visible in the small bowel just distal to the stent.  Comparing this see image to image 16 of the previous small bowel series, the orientation of the bowel is very similar and confirms that the stent device is in the stenotic segment.  There is also some intraluminal contrast visible within small bowel superimposed on the distal stent which I think is probably some retrograde reflux of contrast back up along the stent, between the stent device and the bowel mucosa.  IMPRESSION: Endoscopic small  bowel stent placement just distal to the ligament of Treitz.  These images are consistent with flow of contrast material from the dilated proximal small bowel through the stent into the decompressed small bowel distal to the stent.  These images were submitted for radiologic interpretation only. Please see the procedural report for the amount of contrast and the fluoroscopy time utilized.   Original Report Authenticated By: Kennith Center, M.D.    Dg Abd Portable 1v  12/25/2012  *RADIOLOGY REPORT*  Clinical Data: NG tube placement  PORTABLE ABDOMEN - 1 VIEW  Comparison: 12/15/2012  Findings: Enteric stent remains in place.  Surgical drain along the left side of abdomen.  Cholecystostomy tube projects over the gallbladder lumen.  NG tube placed with its tip in the fundus of the stomach.  No obvious free intraperitoneal gas.  No disproportionate dilatation of bowel.  IMPRESSION: NG tube tip is in the fundus of the stomach.   Original Report Authenticated By: Jolaine Click, M.D.    Dg Abd Portable 1v  12/15/2012  *RADIOLOGY REPORT*  Clinical Data: NG tube placed  PORTABLE ABDOMEN - 1 VIEW  Comparison: 2:40 hours  Findings: NG tube has been placed with its tip in the fundus of the stomach.  The amount of contrast in the stomach has diminished.  No  disproportionate dilatation of bowel.  Bowel stent is stable in the left side of the abdomen.  IMPRESSION: NG tube placed into the stomach.  Diminished contrast in the stomach.   Original Report Authenticated By: Jolaine Click, M.D.    Dg Abd Portable 1v  12/15/2012  *RADIOLOGY REPORT*  Clinical Data: Check contrast progression through the small bowel.  PORTABLE ABDOMEN - 1 VIEW  Comparison: Abdominal radiograph performed 12/14/2012  Findings: Contrast is again seen filling the stomach, and is now better seen filling the duodenum.  There is distension of the fourth segment of the duodenum to 7.2 cm.  This is highly suspicious for occlusion at the level of the jejunal stent.  No contrast is seen progressing through the jejunal stent.  The visualized bowel gas pattern is grossly unremarkable, though there is a relative paucity of bowel gas within the abdomen.  No definite free abdominal air is identified, though evaluation for free air is suboptimal on a single supine view.  The right-sided cholecystostomy tube is grossly unremarkable in appearance.  Left-sided drains are grossly stable, though the position of the more superior drain has shifted mildly.  No acute osseous abnormalities are identified.  IMPRESSION: Contrast again seen filling the stomach, and better seen filling the duodenum.  Distension of the fourth segment of the duodenum to 7.2 cm.  This is highly suspicious for occlusion at the level of the jejunal stent.  No evidence of contrast progression beyond the jejunal stent.   Original Report Authenticated By: Tonia Ghent, M.D.    Dg Abd Portable 1v  12/14/2012  *RADIOLOGY REPORT*  Clinical Data: Small bowel obstruction; assess for patency of jejunal stent.  PORTABLE ABDOMEN - 1 VIEW  Comparison: Abdominal radiograph performed earlier today at 09:06 p.m.  Findings: Contrast is noted filling the stomach; previously noted contrast within the duodenum is no longer well seen and may have become more  dilute, or may have refluxed back into the stomach.  No contrast is seen within the jejunal stent or more distally.  The visualized bowel gas pattern is grossly unremarkable. A right- sided cholecystostomy tube is again noted; left-sided drains are seen.  No acute osseous abnormalities are  seen.  Retrocardiac airspace opacification is again noted.  IMPRESSION:  1.  Contrast noted filling the stomach; previously noted contrast within the duodenum is no longer well seen and may have become more dilute, or may have refluxed back into the stomach.  No contrast seen within the jejunal stent or more distally.  To assess for obstruction at the jejunal stent, a formal upper GI series and small bowel follow-through should be performed, when clinically appropriate.  In the meantime, a follow-up film may be considered in 3-4 hours.  2.  Persistent retrocardiac airspace opacification.   Original Report Authenticated By: Tonia Ghent, M.D.    Dg Abd Portable 1v  12/14/2012  *RADIOLOGY REPORT*  Clinical Data: Small bowel follow-through; follow-up image at 3 hours 10 minutes.  PORTABLE ABDOMEN - 1 VIEW  Comparison: Abdominal radiograph performed earlier today at 06:24 p.m.  Findings: Ingested contrast remains mostly within the stomach, with a small amount of contrast seen extending through the duodenum to the level of the jejunal stent.  No definite contrast is seen extending through the majority of the jejunal stent.  The visualized bowel gas pattern is grossly unremarkable, though there is a relative paucity of bowel gas within the abdomen.  A right-sided cholecystostomy tube is grossly unchanged in appearance.  No definite free intra-abdominal air is identified, though evaluation for free air is suboptimal on supine views.  No acute osseous abnormalities are identified.  Persistent retrocardiac airspace opacification is noted.  IMPRESSION:  1.  Ingested contrast remains mostly within the stomach, with a small amount of  contrast seen extending through the duodenum to the level of the jejunal stent.  No definite contrast seen extending through the majority of the jejunal stent.  A follow-up image is planned in 2 hours.  2.  Persistent retrocardiac airspace opacification seen.   Original Report Authenticated By: Tonia Ghent, M.D.    Dg Abd Portable 1v  12/13/2012  *RADIOLOGY REPORT*  Clinical Data: Status post proximal jejunal stent placement for stenotic segment.  PORTABLE ABDOMEN - 1 VIEW  Comparison: ERCP images from earlier the same day  Findings: The contrast material which was present in the dilated small bowel just proximal to the stent is not readily evident on this film.  There is some contrast posterior wall in the fundus of the stomach. Unfortunately, it is not possible to know whether the contrast seen in the dilated small bowel proximal to the stent on the ERCP has passed through the stent device or refluxed back up into the stomach.  IMPRESSION: No contrast material visible within dilated small bowel proximal to the stent, but since no contrast can be definitely visualized distal to the stent it is unclear whether the contrast refluxed back into the stomach or has migrated distally through the stent and become diluted out in the more distal small bowel loops.   Original Report Authenticated By: Kennith Center, M.D.    Varney Biles Kayleen Memos [place]  12/14/2012  *RADIOLOGY REPORT*  Clinical Data:  Nausea and vomiting after small bowel wall stent placement.  UPPER GI SERIES WITH KUB  Technique:  Routine upper GI series was performed with thin barium.  Fluoroscopy Time: Zero minutes  Comparison:  None.  Findings: Scout view of the abdomen shows and metallic wall stent in the left abdomen.  Surgical drain is also seen in the left abdomen. Postoperative changes and a pigtail drain are seen in the right abdomen.  Relative paucity of gas overall.  An extremely limited examination was  performed at the request of the referring physician.   The patient drank one couple of barium. A single AP supine image shows contrast in the stomach.  IMPRESSION: Contrast remains in the stomach after ingestion of one cup of barium.  The patient will drink another half cup of barium and will be reimaged in 2 hours to assess patency of the small bowel stent.   Original Report Authenticated By: Leanna Battles, M.D.    Dg Kayleen Memos W/small Bowel  12/09/2012  *RADIOLOGY REPORT*  Clinical Data:Persistent vomiting.  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 Thin barium also used.  Subsequently, serial images of the small bowel were obtained including spot views of the terminal ileum.  Fluoroscopy Time: 4.2-minute  Comparison: CT abdomen 11/27/2012  Findings: Thin barium was injected through the indwelling NG tube into  the stomach.  The stomach is mildly dilated.  The duodenum is markedly dilated.  There is a transition point at the ligament of Treitz with a stricture present at the ligament of Treitz.  There is very little contrast in the proximal jejunum.  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  Delayed images were obtained for 1 hour 15 minutes showing only a small amount of barium in the proximal jejunum.  IMPRESSION: High-grade obstruction at the ligament of Treitz with dilatation of the duodenum.  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  I discussed the findings with Willette Cluster, PA [place] [facility]   Original Report Authenticated By: Janeece Riggers, M.D.    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  UPPER GI SERIES WITH KUB  Technique:  Routine upper GI series was performed with Omnipaque- 300  Fluoroscopy Time: 1.47  minutes  Comparison:  CT scan 12/16/2012  Findings: Contrast was infused via the NG tube in the stomach. Stomach is unremarkable.  Very little peristaltic action was demonstrated but contrast did enter into the duodenum with the patient on the right side and elevated.  Occasional GE reflux was noted.  There was to and fro movement of contrast in the second portion of the duodenum which was mildly dilated.  Very little contrast entered the duodenal stent but a small amount did pass through it.  Very little contrast was seen beyond the stent.  IMPRESSION:  1.  Mild dilatation of the duodenum with very little contrast seen entering the duodenal stent. 2.  No dilated distal small bowel to suggest obstruction.   Original Report Authenticated By: Rudie Meyer, M.D.    Dg C-arm Gt 120 Min  12/11/2012  *RADIOLOGY REPORT*  Clinical Data: Duodenal stricture.  Attempted endoscopy.  DG C-ARM GT 120 MIN,ABDOMEN - 1 VIEW  Technique: 20 minutes and 33 seconds of fluoroscopy was utilized.  Comparison:  Upper GI 12/09/2012.  Findings: Three images are submitted.  The biliary drain and abdominal drain are in place.  The endoscope remains in the stomach, all three images.  IMPRESSION:  1.  Intraoperative fluoroscopy for guidance of endoscopy.   Original Report Authenticated By: Marin Roberts, M.D.    Dg C-arm 61-120 Min-no Report  12/30/2012  CLINICAL DATA: duodenal stricture   C-ARM 61-120 MINUTES  Fluoroscopy was utilized by the requesting physician.  No  radiographic  interpretation.           Subjective: Patient complains of left elbow pain; he believes this is related to his bursitis which has happened in the past. Denies fevers, chills, chest pain, shortness breath, vomiting, abdominal pain, dysuria, hematuria. Patient is tolerating clear liquid diet  Objective: Filed Vitals:   01/06/13 2110 01/07/13 0452 01/07/13 2055 01/08/13 0600  BP: 120/73 125/68 125/81 118/70  Pulse: 84 82 70 76  Temp: 97.6 F  (36.4 C) 97.2 F (36.2 C) 97.5 F (36.4 C) 98 F (36.7 C)  TempSrc: Oral Axillary Oral Oral  Resp: 18 18 18 16   Height:      Weight:      SpO2: 99% 100% 100% 100%    Intake/Output Summary (Last 24 hours) at 01/08/13 1101 Last data filed at 01/08/13 1610  Gross per 24 hour  Intake   1080 ml  Output   3750 ml  Net  -2670 ml   Weight change:  Exam:   General:  Pt is alert, follows commands appropriately, not in acute distress  HEENT: No icterus, No thrush, No neck mass, [place]/AT  Cardiovascular: RRR, S1/S2, no rubs, no gallops  Respiratory: CTA bilaterally, no wheezing, no crackles, no rhonchi  Abdomen: Soft/+BS, non tender, non distended, no guarding  Extremities: No edema, No lymphangitis, No petechiae, No rashes, no synovitis; left elbow without erythema; no joint effusion appreciated; pinpoint tenderness at the olecranon process  Data Reviewed: Basic Metabolic Panel:  Lab 01/08/13 9604 01/07/13 0430 01/06/13 0815 01/04/13 0640 01/03/13 0500 01/02/13 0619 01/01/13 1851  NA 138 143 139 131* 126* -- --  K 3.2* 3.2* 3.7 3.7 3.4* -- --  CL 109 112 109 104 99 -- --  CO2 19 19 19  17* 17* -- --  GLUCOSE 112* 106* 100* 115* 125* -- --  BUN 28* 24* 26* 35* 46* -- --  CREATININE 1.04 1.08 0.99 0.83 0.94 -- --  CALCIUM 9.6 10.0 10.1 9.6 9.6 -- --  MG 1.5 -- 1.6 -- -- -- 1.8  PHOS 4.0 -- 5.0* 4.5 3.1 3.9 --   Liver Function Tests:  Lab 01/08/13 0440 01/06/13 0815 01/01/13 1851  AST 25 30 24   ALT 43 50 57*  ALKPHOS 375* 390* 357*  BILITOT 0.7 1.0 1.0  PROT 6.9 6.8 7.3  ALBUMIN 2.2* 2.2* 2.6*   No results found for this basename: LIPASE:5,AMYLASE:5 in the last 168 hours No results found for this basename: AMMONIA:5 in the last 168 hours CBC:  Lab 01/06/13 0815 01/04/13 1025  WBC 6.1 8.1  NEUTROABS 4.4 --  HGB 8.7* 8.8*  HCT 26.5* 26.5*  MCV 85.8 83.6  PLT 292 274   Cardiac Enzymes: No results found for this basename: CKTOTAL:5,CKMB:5,CKMBINDEX:5,TROPONINI:5 in  the last 168 hours BNP: No components found with this basename: POCBNP:5 CBG:  Lab 01/08/13 0605 01/07/13 2356 01/07/13 1215 01/07/13 0610 01/06/13 2343  GLUCAP 115* 98 111* 103* 104*    Recent Results (from the past 240 hour(s))  CATH TIP CULTURE     Status: Normal   Collection Time   01/04/13  2:18 PM      Component Value Range Status Comment   Specimen Description CATH TIP   Final    Special Requests NONE   Final    Culture NO GROWTH 2 DAYS   Final    Report Status 01/06/2013 FINAL   Final   CULTURE, BLOOD (ROUTINE X 2)     Status: Normal (Preliminary result)  Collection Time   01/04/13  6:55 PM      Component Value Range Status Comment   Specimen Description BLOOD LEFT ANTECUBITAL   Final    Special Requests Normal BOTTLES DRAWN AEROBIC AND ANAEROBIC 5CC   Final    Culture  Setup Time 01/05/2013 14:11   Final    Culture     Final    Value:        BLOOD CULTURE RECEIVED NO GROWTH TO DATE CULTURE WILL BE HELD FOR 5 DAYS BEFORE ISSUING A FINAL NEGATIVE REPORT   Report Status PENDING   Incomplete   CULTURE, BLOOD (ROUTINE X 2)     Status: Normal (Preliminary result)   Collection Time   01/04/13  7:01 PM      Component Value Range Status Comment   Specimen Description BLOOD LEFT ANTECUBITAL   Final    Special Requests Normal BOTTLES DRAWN AEROBIC ONLY 3CC   Final    Culture  Setup Time 01/05/2013 14:10   Final    Culture     Final    Value:        BLOOD CULTURE RECEIVED NO GROWTH TO DATE CULTURE WILL BE HELD FOR 5 DAYS BEFORE ISSUING A FINAL NEGATIVE REPORT   Report Status PENDING   Incomplete      Scheduled Meds:   . antiseptic oral rinse  15 mL Mouth Rinse BID  . enoxaparin (LOVENOX) injection  40 mg Subcutaneous Q24H  . insulin aspart  0-9 Units Subcutaneous Q6H  . ketorolac  15 mg Intravenous Once  . lip balm  1 application Topical BID  . metoCLOPramide  10 mg Intravenous QID  . pantoprazole  40 mg Intravenous Q12H  . sodium chloride  10-40 mL Intracatheter Q12H  .  sodium chloride  10-40 mL Intracatheter Q12H  . vancomycin  1,000 mg Intravenous Q24H   Continuous Infusions:   . sodium chloride 100 mL/hr at 01/08/13 0442  . dextrose 5 % and 0.9% NaCl 75 mL/hr at 01/07/13 0645  . TPN (CLINIMIX) +/- additives 115 mL/hr at 01/07/13 1858   And  . fat emulsion 250 mL (01/07/13 1858)  . TPN [facility]) +/- additives       Alan Dillenbeck, DO  [facility] Pager [phone]  If 7PM-7AM, please contact night-coverage [email or web address] Password [email or web address] 01/08/2013, 11:01 AM   LOS: 20 days

## 2013-01-08 NOTE — Discharge Summary (Signed)
Physician Discharge Summary  [name] BJY:[id number] DOB: Dec 13, 1956 DOA: 12/19/2012  PCP: [name], MD  Admit date: 12/19/2012 Discharge date: 01/09/2013  Recommendations for Outpatient Follow-up:  1. Pt will need to follow up with PCP in 2 weeks post discharge 2. Please obtain BMP to evaluate electrolytes and kidney function 3. 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 [name] in [facility] which has left him with multiple drains and fistulas. He also had recent cholecystitis. [facility]. 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 A duodenal stent was placed by GI during second attempt. He however continued to be obstructed. He was transferred to [facility] on 1/3 for evaluation and management. Since hospitalization, he has continued to have copious NG tube drainage. Surgeons and GI do not see any surgical/intervention options. Oncology does not recommend any further chemotherapy at this time. Patient in the interim developed septic shock from MRSA bacteremia/likely line sepsis. Palliative team consulted for goals of care-patient wishes to continue aggressive course.  Assessment/Plan:  MRSA bacteremia  -Source most likely Line  sepsis (had PICC line and Port-A-Cath): Less likely from Cholangitis.  - currently on vancomycin. Patient was transferred to ICU for hypotension and tachycardia.  initial procalcitonin was 43.3 and lactic acid was 1.5  - ID recommended removal of PICC line & Port-A-Cath, line holiday , check repeat blood culture and prolonged IV antibiotics. Blood cultures were 2 x 2 positive for MRSA. Repeat blood cultures 1/11 negative..  Patient finally agreed on getting hospice involved on Dr. Elsie Stain request.  -removed PICC line and Port-A-Cath on 1/19 ( after patient agreed)  -Repeat blood cultures were obtained on 01/04/2013 and were negative -Catheter tip cultures were also negative for growth -thrombocytopenia have resolved. H/h stable.  -New PICC line placed 01/07/13  -TPN was started on 01/07/2013 evening without complications. The patient tolerated it well. -Discussed with ID--plan 6 weeks of intravenous vancomycin with the start date of 01/04/2013--anticipated stop date 02/15/2013 -[facility] to assist and monitor his vancomycin dosing Hypokalemia and hypomagnesemia:  Pharmacy replacing potassium and magnesium. -patient also received supplementation to correct potassium on day of discharge Spasms  spams of ribs , abdomen and legs. Improved with increased dose of Robaxin and low-dose morphine  Hyponatremia  Likely with hypovolemia. Improved with IV hydration  .  Peritoneal carcinomatosis  diagnosed in December 2012 s/p extensive debulking surgery at [facility] twice (1st time January 2013 second time 08/2012) . Patient has had multiple surgical procedures . Palliative consult appreciated .  Patient wishes to be partial code, wants CPR but no intubation or mechanical ventilation DNI  Small bowel obstruction/duodenal obstruction despite stent:  Currently conservative management-n.p.o., and TPN. Failed NGT clamping trial after 5-1/2 hours on 1/11.. Failed attempt at  jejunal stent revision on 1/14--Diagnostic small bowel enteroscopy 1/14 with  failed jejunal stent revision secondary to marked tumor infiltration at the level of the jejunal stent and beyond  . Gastrostomy tube was placed for decompression purposes on January 16 .  GI have no further recommendations. The patient was subsequently able to tolerate some clear liquids without difficulty. Successful gastrostomy tube placed by IR on 1/16 . Was attached to wall suction , was uncomfortable on placing to gravity. Hospice will arrange for suction pump at home as needed. However, the patient was finally able to tolerate his gastrostomy tube placed to gravity without any nausea or vomiting. -[facility] consulted and will assist the patient in sitting up and monitoring his total parenteral nutrition at home -Cholecystitis status post cholecystostomy drain placed at [facility]  - IR performed cholecystostomy tube change on 1/6.  Left Elbow pain/bursitis  -xray left elbow  -trial of toradol   Code Status: partial ( DNI but not DNR)  Family Communication: Discussed with patient  Disposition Plan:  Patient is very hopeful about getting better and repeatedly requests on getting opinion is at different centers including [facility] and [facility] for possible transfer. I discussed this periodically with him that chemotherapy is not an option at this time and his surgeon Dr. [name] at [facility] also did not suggest the need for any surgery at this time. He was seen by his oncologist [name] again on 1/20 who recommended that he is not a candidate for any further treatment or clinical studies given his advanced disease, multiple complications and poor functional status. He wishes to seek consultation at different facility once being discharged from here. At present he agrees to go home with hospice followup.  Patient's PICC line and Port-A-Cath was removed on 1/19 and and ordered repeat blood culture which is  negative for growth at 48 hours. I will order for a PICC line placement and resume his TPN that is currently being held.  -Patient will then be discharged home on TPN and IV vancomycin with home hospice followup. TPN resumed on 1/22  Consultants:  Oncology  [facility]  IR  General surgery.  Critical care medicine.  Palliative care medicine  ID Procedures:  NG tube  Change of cholecystostomy tube by IR on 1/6  Diagnostic small bowel enteroscopy 1/14 with failed jejunal stent revision secondary to marked tumor infiltration at the level of the jejunal stent and beyond IR gastrostomy on 1/16 for decompression New PICC reinsertion ( 1/22) Antibiotics:  IV vancomycin 1/6 >  IV Zosyn 1/6 > 1/8    Discharge Condition: stable  Disposition:  Follow-up Information    Follow up with [facility] ([facility] ([facility] to follow aftr discharge pls notify whn transport on unit & pt ready to d/c call [phone] (or [phone] aftr 5 pm))    Contact information:   [facility] [address] [place], [place] [phone]         Diet: clear liquid Wt Readings from Last 3 Encounters:  12/30/12 92.534 kg (204 lb)  12/30/12 92.534 kg (204 lb)  12/11/12 92.534 kg (204 lb)    History of present illness:  57 year old male with complex medical history with metstatic cancer status post chemotherapy and a urostomy, hypertension anemia of chronic disease with bowel resection, ileostomy and gastrostomy in January 2013 followed by 13 cycles of chemotherapy. Subsequently in September 2013 he underwent exploratory laparotomy, dissection of segmented item and tumor debulking. His postop course was complicated by perforation off the distal tonsils: Requiring drainage and  repair. 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 [facility] of [facility] care management of the brain and nutritional support. He was followed and operated at [facility] by his oncologist Dr. [name]. One month back he was on TPN along with clear liquids by mouth. He however started having increased nausea and vomiting and again made n.p.o. Repeat CT of the abdomen and pelvis showed diffusely thickened small bowel loops secondary to intermittently changes or ischemia. An NGT was placed and her for surgical consultation he was not considered a surgical candidate. 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 Dr. Arlyce Dice attempted to stent placement on 12/26 2013 to,, however this failed and finally a gastrostomy tube was attempted to be placed on December 27 . his surgeon Dr. [name] was able to get a stent placed into the obstruction December 28 however his vomiting was persistent. A CT of the abdomen and pelvis was repeated on December 31which showed a possible filling stent to be patent however a very thin strain of contrast material was able to pass the stent. GI was reconsulted who felt that the restenting would not be fair to the patient and family meeting was held on 1/2 with the surgeon and patient was made aware that the obstruction was due to the peritoneal cancer. He was given the option about involving palliative care versus discussing with his oncologist Dr. Juliette Alcide about further treatment options. Patient wished to be transferred to [facility] to get a second opinion from his oncologist to see if chemotherapy with very beneficial.  Patient has an NG tube at present we about 1300-1500 mL daily output. He has 3 Jackson-Pratt drains along with nonsurgical abdominal wound and the ostomy. He has been n.p.o. and on total parenteral nutrition with an NG tube placed to low wall suction he also has a wound eakin  pouch.     Discharge Exam: Filed Vitals:   01/09/13 0550  BP: 116/73  Pulse: 83  Temp: 98.4 F (36.9 C)  Resp: 16   Filed Vitals:   01/08/13 0600 01/08/13 1315 01/08/13 2205 01/09/13 0550  BP: 118/70 119/78 125/80 116/73  Pulse: 76 77 74 83  Temp: 98 F (36.7 C) 97.5 F (36.4 C) 98.3 F (36.8 C) 98.4 F (36.9 C)  TempSrc: Oral Oral Oral Oral  Resp: 16 18 18 16   Height:      Weight:      SpO2: 100% 100% 100% 100%   General: A&O x 3, NAD, pleasant, cooperative Cardiovascular: RRR, no rub, no gallop, no S3 Respiratory: CTAB, no wheeze, no rhonchi Abdomen:soft, nontender, nondistended, positive bowel sounds Extremities: No edema, No lymphangitis, no petechiae  Discharge Instructions      Discharge Orders    Future Orders Please Complete By Expires   Diet - low sodium heart healthy      Increase activity slowly      Discharge instructions      Comments:   [facility] will manage your vancomycin and IV nutrition.       Medication List     As of 01/09/2013 11:13 AM    STOP taking these  medications         aluminum-magnesium hydroxide-simethicone 200-200-20 MG/5ML Susp   Commonly known as: MAALOX      cloNIDine 0.2 mg/24hr patch   Commonly known as: CATAPRES - Dosed in mg/24 hr      gabapentin 300 MG capsule   Commonly known as: NEURONTIN      HYDROcodone-acetaminophen 5-325 MG per tablet   Commonly known as: NORCO/VICODIN      ibuprofen 200 MG tablet   Commonly known as: ADVIL,MOTRIN      metoprolol succinate 25 MG 24 hr tablet   Commonly known as: TOPROL-XL      ondansetron 4 MG tablet   Commonly known as: ZOFRAN      sennosides-docusate sodium 8.6-50 MG tablet   Commonly known as: SENOKOT-S      sodium bicarbonate 650 MG tablet      TAKE these medications         allopurinol 100 MG tablet   Commonly known as: ZYLOPRIM   Take 100 mg by mouth daily.      cholecalciferol 1000 UNITS tablet   Commonly known as: VITAMIN D   Take  1,000 Units by mouth daily.      menthol-cetylpyridinium 3 MG lozenge   Commonly known as: CEPACOL   Take 1 lozenge by mouth every 2 (two) hours as needed. Sore throat      metoCLOPramide 5 MG/ML injection   Commonly known as: REGLAN   Inject 10 mg into the vein 4 (four) times daily.      morphine CONCENTRATE 10 mg / 0.5 ml concentrated solution   Take 0.25 mLs (5 mg total) by mouth every hour as needed for pain.      multivitamin with minerals tablet   Take 1 tablet by mouth daily.      PHENERGAN 25 MG/ML injection   Generic drug: promethazine   Inject 25 mg into the vein every 8 (eight) hours as needed. Nausea/vomiting      pregabalin 25 MG capsule   Commonly known as: LYRICA   Take 25 mg by mouth 2 (two) times daily.      PROTONIX 40 MG injection   Generic drug: pantoprazole   Inject 40 mg into the vein every 12 (twelve) hours.      sodium chloride 0.65 % nasal spray   Commonly known as: OCEAN   Place 1 spray into the nose as needed. Nasal congestion      UNABLE TO FIND   Vancomycin per [facility] protocol for labs and dosing      UNABLE TO FIND   TPN per [facility] protocol for labs and dosing      ZOFRAN 4 MG/2ML Soln injection   Generic drug: ondansetron   Inject 4 mg into the vein 4 (four) times daily.      ZOFRAN 2 MG/ML Soln injection   Generic drug: Ondansetron HCl   Inject 4 mg into the vein every 4 (four) hours as needed. For nausea/vomiting      ASK your doctor about these medications         nitroGLYCERIN 0.4 MG SL tablet   Commonly known as: NITROSTAT   Place 0.4 mg under the tongue every 5 (five) minutes as needed. For chest pain, Hold for SBP<100           The results of significant diagnostics from this hospitalization (including imaging, microbiology, ancillary and laboratory) are listed below for reference.    Significant Diagnostic Studies: Dg Elbow  2 Views Left  01/08/2013  *RADIOLOGY REPORT*  Clinical Data: Left elbow pain. Edema.  Bursitis.   LEFT ELBOW - 2 VIEW  Comparison: Radiographs dated 04/28/2008  Findings: There is prominent soft tissue swelling in the region of the olecranon bursa but extending proximally adjacent to the distal humerus.  The soft tissue swelling extends anterior to the distal humerus.  There is no fracture or dislocation.  There is a slight arthritic blurring in the elbow joint.  There is an 8 mm irregular foreign body which is adjacent to the lateral epicondyle which appears to be above the origin of the common extensor tendon.  On the lateral view there appears to be a loose body posteriorly in the elbow joint.  IMPRESSION:  1.  Marked soft tissue swelling around the elbow joint but there is no definitive joint effusion.  The soft tissue swelling extends anterior to the distal humerus and is more than a simple olecranon bursitis. 2.  Osteoarthritic changes of the elbow joint. 3.  Probable loose body in the joint. 4.  Probable foreign body in the soft tissues adjacent to the lateral epicondyle.   Original Report Authenticated By: Francene Boyers, M.D.    Dg Abd 1 View - Kub  12/11/2012  *RADIOLOGY REPORT*  Clinical Data: Duodenal stricture.  Attempted endoscopy.  DG C-ARM GT 120 MIN,ABDOMEN - 1 VIEW  Technique: 20 minutes and 33 seconds of fluoroscopy was utilized.  Comparison:  Upper GI 12/09/2012.  Findings: Three images are submitted.  The biliary drain and abdominal drain are in place.  The endoscope remains in the stomach, all three images.  IMPRESSION:  1.  Intraoperative fluoroscopy for guidance of endoscopy.   Original Report Authenticated By: Marin Roberts, M.D.    Ct Abdomen Pelvis W Contrast  12/16/2012  *RADIOLOGY REPORT*  Clinical Data: Small bowel obstruction  CT ABDOMEN AND PELVIS WITH CONTRAST  Technique:  Multidetector CT imaging of the abdomen and pelvis was performed following the standard protocol during bolus administration of intravenous contrast.  Contrast: 80mL OMNIPAQUE IOHEXOL 300 MG/ML   SOLN  Comparison: Abdominal x-ray is of multiple recent base.  Findings: Images which include the lower chest shows small bilateral pleural effusions with bibasilar collapse / consolidation.  NG tube tip is noted in the mid stomach.  There appears to be some wall thickening in the distal stomach, but this may be related to the underdistended state.  Duodenum is opacified.  There is a stent in the proximal jejunum.  Contrast material enters the proximal end of the stent and a very thin string of contrast can be seen to track through the stent lumen in the small bowel beyond the stent.  There is soft tissue or debris filling the stent lumen creating only is a very thin channel for flow of contrast through the stent.  Small bowel loops distal to the stent are decompressed.  There is a small amount of free fluid in the abdomen. Bowel anatomy is difficult to discern given the fairly substantial amount of adhesions and scarring in this patient with peritoneal carcinomatosis.  There is a right lower quadrant stoma, presumably ileostomy.  The patient appears to be status post omentectomy and probably right hemicolectomy.  A small bowel anastomoses is seen in the right abdomen.  No focal abnormalities seen in the liver or spleen.  Pancreas is unremarkable.  The adrenal glands are normal.  No evidence for hydronephrosis.  Probable cyst noted in the right kidney.  No abdominal aortic  aneurysm.  Pigtail catheter is seen coiled in the anterior midline in or just deep to the rectus sheath.  A second to pigtail catheter in the right upper quadrant adjacent to the gallbladder fundus is consistent with a reported history of cholecystostomy.  The gallbladder is decompressed.  Surgical drain is positioned in the left paracolic gutter.  Imaging through the pelvis shows enlarged prostate gland.  The bladder is decompressed.  Left colon is decompressed.  Bone windows reveal no worrisome lytic or sclerotic osseous lesions.  IMPRESSION:  The proximal jejunal stent device is patent although only of very thin string of contrast material passes through the stent into the decompressed small bowel distal to the stent. Even with the stent in place, this segment of bowel appears to represent a region of luminal stenosis.  The duodenum proximal to the stent is distended.  Distal stomach appears to have abnormal circumferential wall thickening with some luminal compromise, but no outlet obstruction at this time.   Original Report Authenticated By: Kennith Center, M.D.    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  The patient is also status post prior percutaneous cholecystostomy tube placement for acalculous cholecystitis.  Assessment of the cholecystostomy tube is performed.  1.  CHOLANGIOGRAM THROUGH PREEXISTING CATHETER 2.  PERCUTANEOUS CHOLECYSTOSTOMY TUBE EXCHANGE  Contrast:  50 ml Omnipaque-300  Fluoroscopy Time: 7.5 minutes.  Procedure:  The procedure, risks, benefits, and alternatives were explained to the patient.  Questions regarding the procedure were encouraged and answered.  The patient understands and consents to the procedure.  The preexisting percutaneous cholecystostomy tube was prepped with Betadine in a sterile fashion, and a sterile drape was applied covering the operative field.  A sterile gown and sterile gloves were used for the procedure.  The preexisting catheter was injected with contrast material under fluoroscopy.  It was then removed over a guidewire.  A 5-French catheter was then introduced over a guidewire and advanced into the lumen of the gallbladder.  Additional contrast injection was performed.  The catheter was further advanced through the cystic duct and into the common bile duct and additional cholangiogram performed through the catheter.  The catheter was then retracted into the lumen of the  gallbladder.  A new 10-French multipurpose drainage catheter was advanced over a wire and formed in the gallbladder lumen.  Final catheter position was confirmed with a fluoroscopic spot image obtained after injection of contrast.  The catheter was secured at the skin with a Prolene retention suture and Stat-Lock device.  The catheter was connected to a gravity drainage bag.  Complications:  None  Findings:  Injection of a preexisting drain shows the catheter to lie just outside the lumen of the gallbladder.  Contrast injection does partially fill the gallbladder lumen and demonstrates a significant amount of internal debris and sludge within the gallbladder.  After removal of the preexisting catheter, a percutaneous tract to the gallbladder lumen was able to be catheterized.  Cholangiogram shows very slow and sluggish flow of contrast via the cystic duct into the common bile duct.  A significant amount of debris is present extending into the cystic duct and also within the distal aspect of the common bile duct.  With a catheter further advanced into the CBD, contrast injection does show a patent common bile duct with flow of contrast noted via the ampulla into the duodenum.  Complex fistulas  were also identified at the time of the cholangiogram with injection of the gallbladder lumen.  There is a fistula demonstrated to adjacent small bowel with eventual opacification of several right sided small bowel loops demonstrating poor motility.  An additional fistula extends medially into a space adjacent to the descending duodenum.  Given demonstration of fistulas as well as poor flow of contrast from the gallbladder into the common bile duct, a new cholecystostomy tube was placed and formed at the level of the gallbladder lumen.  This will be left to gravity drainage.  IMPRESSION: The preexisting catheter lies just outside the lumen of the gallbladder.  After recanalizing a tract to the gallbladder lumen, contrast  injection demonstrates significant debris in the lumen of the gallbladder and poor flow of contrast into the common bile duct.  Debris was also present in the distal aspect of the CBD. The study also demonstrated complex fistulas communicating with the gallbladder lumen and opacifying both small bowel as well as a tract to a cavity lying adjacent to the descending duodenum.  A new 10-French catheter was formed in the gallbladder and will be left to gravity drainage.   Original Report Authenticated By: Irish Lack, M.D.    Ir Gastrostomy Tube Mod Sed  01/01/2013  *RADIOLOGY REPORT*  PULL THROUGH GASTROSTOMY TUBE PLACEMENT UNDER FLUOROSCOPIC GUIDANCE  Date: 01/01/2013  Clinical History: 57 year old male with terminal peritoneal carcinomatosis and jejunal obstruction.  He requires a diverting gastrostomy tube for palliation.  Procedures Performed:  1.  Fluoroscopically guided placement of percutaneous pull-through gastrostomy tube.  Interventional Radiologist:  Sterling Big, MD  Sedation: Moderate (conscious) sedation was used.  To the mg Versed, 100 mcg Fentanyl were administered intravenously.  The patient's vital signs were monitored continuously by radiology nursing throughout the procedure.  Sedation Time: 30 minutes  Fluoroscopy time: 4.8 minutes  Contrast volume: 10 ml Omnipaque-300 administered into the stomach  PROCEDURE/FINDINGS:   Informed consent was obtained from the patient following explanation of the procedure, risks, benefits and alternatives. The patient understands, agrees and consents for the procedure. All questions were addressed. A time out was performed.  Maximal barrier sterile technique utilized including caps, mask, sterile gowns, sterile gloves, large sterile drape, hand hygiene, and chlorhexadine skin prep.  An angled catheter was advanced over a wire under fluoroscopic guidance through the nose, down the esophagus and into the body of the stomach.  The stomach was then  insufflated with several 100 ml of air.  Fluoroscopy confirmed location of the gastric bubble, as well as inferior displacement of the barium stained colon.  Under direct fluoroscopic guidance, a single T-tack was placed, and the anterior gastric wall drawn up against the anterior abdominal wall. Percutaneous access was then obtained into the mid gastric body with an 18 gauge sheath needle.  Aspiration of air, and injection of contrast material under fluoroscopy confirmed needle placement.  An Amplatz wire was advanced in the gastric body.  The tract was then serially dilated to 12-French.  A 12 [place] Wills-Oglesby decompressive gastrostomy tube was then advanced over the wire and positioned within the gastric body.  A gentle hand injection of contrast material confirmed intragastric location.  This single T tack was cut.  The tube was secured to the skin with both Prolene suture.  There is no immediate complication, the patient tolerated the procedure well.  The patient will be observed for several hours with the newly placed tube on low wall suction to evaluate for any post procedure  complication.  IMPRESSION:  Successful placement of a 53 French decompressive gastrostomy tube. The tube is designed for decompression only and is not designed for administration of nutrition or medications.  Connect to low wall suction as needed.  Signed,  Sterling Big, MD Vascular & Interventional Radiologist [facility] Radiology   Original Report Authenticated By: Malachy Moan, M.D.    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  The patient is also status post prior percutaneous cholecystostomy tube placement for acalculous cholecystitis.  Assessment of the cholecystostomy tube is performed.  1.  CHOLANGIOGRAM THROUGH PREEXISTING CATHETER 2.  PERCUTANEOUS CHOLECYSTOSTOMY TUBE EXCHANGE   Contrast:  50 ml Omnipaque-300  Fluoroscopy Time: 7.5 minutes.  Procedure:  The procedure, risks, benefits, and alternatives were explained to the patient.  Questions regarding the procedure were encouraged and answered.  The patient understands and consents to the procedure.  The preexisting percutaneous cholecystostomy tube was prepped with Betadine in a sterile fashion, and a sterile drape was applied covering the operative field.  A sterile gown and sterile gloves were used for the procedure.  The preexisting catheter was injected with contrast material under fluoroscopy.  It was then removed over a guidewire.  A 5-French catheter was then introduced over a guidewire and advanced into the lumen of the gallbladder.  Additional contrast injection was performed.  The catheter was further advanced through the cystic duct and into the common bile duct and additional cholangiogram performed through the catheter.  The catheter was then retracted into the lumen of the gallbladder.  A new 10-French multipurpose drainage catheter was advanced over a wire and formed in the gallbladder lumen.  Final catheter position was confirmed with a fluoroscopic spot image obtained after injection of contrast.  The catheter was secured at the skin with a Prolene retention suture and Stat-Lock device.  The catheter was connected to a gravity drainage bag.  Complications:  None  Findings:  Injection of a preexisting drain shows the catheter to lie just outside the lumen of the gallbladder.  Contrast injection does partially fill the gallbladder lumen and demonstrates a significant amount of internal debris and sludge within the gallbladder.  After removal of the preexisting catheter, a percutaneous tract to the gallbladder lumen was able to be catheterized.  Cholangiogram shows very slow and sluggish flow of contrast via the cystic duct into the common bile duct.  A significant amount of debris is present extending into the cystic duct  and also within the distal aspect of the common bile duct.  With a catheter further advanced into the CBD, contrast injection does show a patent common bile duct with flow of contrast noted via the ampulla into the duodenum.  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  There is a fistula demonstrated to adjacent small bowel with eventual opacification of several right sided small bowel loops demonstrating poor motility.  An additional fistula extends medially into a space adjacent to the descending duodenum.  Given demonstration of fistulas as well as poor flow of contrast from the gallbladder into the common bile duct, a new cholecystostomy tube was placed and formed at the level of the gallbladder lumen.  This will be left to gravity drainage.  IMPRESSION: The preexisting catheter lies just outside the lumen of the gallbladder.  After recanalizing a tract to the gallbladder  lumen, contrast injection demonstrates significant debris in the lumen of the gallbladder and poor flow of contrast into the common bile duct.  Debris was also present in the distal aspect of the CBD. The study also demonstrated complex fistulas communicating with the gallbladder lumen and opacifying both small bowel as well as a tract to a cavity lying adjacent to the descending duodenum.  A new 10-French catheter was formed in the gallbladder and will be left to gravity drainage.   Original Report Authenticated By: Irish Lack, M.D.    Dg Sinus/fist Tube Chk-non Gi  12/22/2012  *RADIOLOGY REPORT*  Clinical Data:Evaluate for fistula  ABSCESS INJECTION  Fluoroscopy Time: 1.09 minutes  Comparison: CT of the abdomen and pelvis dated 12/16/2012  Findings: Scout radiograph was obtained.  This shows a nasogastric tube within the stomach.  There is a metallic stent identified within the proximal jejunum.  JP drain overlies the left lower quadrant of the abdomen there is a  percutaneous cholecystostomy tube within the right abdomen.  Left lower quadrant the JP drain was accessed and 40 ml of omni 300 was injected into the drain.  No significant extravasation of contrast material from the draining identified to suggest residual abscess or fistula formation.  Mild intra vascular extravasation with opacification of the left lower quadrant venous structures noted.  IMPRESSION: 1.  No evidence for residual abscess or fistula formation surrounding the left lower quadrant JP drain.  Findings were discussed with Dr. Michaell Cowing at the time the procedure was performed on 12/22/2012.   Original Report Authenticated By: Signa Kell, M.D.    Dg Chest Port 1 View  12/22/2012  *RADIOLOGY REPORT*  Clinical Data: Chills.  Shortness of breath.  PORTABLE CHEST - 1 VIEW  Comparison: 11/27/2012  Findings: Left Port-A-Cath and right PICC line remain in place, unchanged.  NG tube has been placed into the stomach.  Lungs are clear.  No effusions.  Heart is normal size.  IMPRESSION: No acute cardiopulmonary disease.   Original Report Authenticated By: Charlett Nose, M.D.    Dg Ercp  12/13/2012  *RADIOLOGY REPORT*  Clinical Data: Stenotic small bowel segment just distal to the ligament of Treitz.  ERCP  Comparison:  Small bowel series from 12/09/2012  Technique:  Multiple spot images obtained with the fluoroscopic device and submitted for interpretation post-procedure.  ERCP was performed by Dr.  Arlyce Dice.  Findings: Four spot fluoro images are submitted.  I discussed this study by telephone with Dr. Arlyce Dice immediately after the procedure was completed.  The first image demonstrates a small bowel stent over the left abdomen.  The second image shows injection of contrast material which opacifies the dilated loop just proximal to the intraluminal stent. The stent is opacified throughout its entire length and there does appear to be contrast draining from the distal end of the stent into nondilated small bowel.   The third image again shows contrast in the dilated loop proximal to the stent with opacification of the stent lumen down to the distal end of the stent.  The fourth image shows contrast material in the dilated small bowel loop just proximal to the stent.  There is contrast within the stent device with a small amount of contrast visible in the small bowel just distal to the stent.  Comparing this see image to image 16 of the previous small bowel series, the orientation of the bowel is very similar and confirms that the stent device is in the stenotic segment.  There is also  some intraluminal contrast visible within small bowel superimposed on the distal stent which I think is probably some retrograde reflux of contrast back up along the stent, between the stent device and the bowel mucosa.  IMPRESSION: Endoscopic small bowel stent placement just distal to the ligament of Treitz.  These images are consistent with flow of contrast material from the dilated proximal small bowel through the stent into the decompressed small bowel distal to the stent.  These images were submitted for radiologic interpretation only. Please see the procedural report for the amount of contrast and the fluoroscopy time utilized.   Original Report Authenticated By: Kennith Center, M.D.    Dg Abd Portable 1v  12/25/2012  *RADIOLOGY REPORT*  Clinical Data: NG tube placement  PORTABLE ABDOMEN - 1 VIEW  Comparison: 12/15/2012  Findings: Enteric stent remains in place.  Surgical drain along the left side of abdomen.  Cholecystostomy tube projects over the gallbladder lumen.  NG tube placed with its tip in the fundus of the stomach.  No obvious free intraperitoneal gas.  No disproportionate dilatation of bowel.  IMPRESSION: NG tube tip is in the fundus of the stomach.   Original Report Authenticated By: Jolaine Click, M.D.    Dg Abd Portable 1v  12/15/2012  *RADIOLOGY REPORT*  Clinical Data: NG tube placed  PORTABLE ABDOMEN - 1 VIEW  Comparison:  2:40 hours  Findings: NG tube has been placed with its tip in the fundus of the stomach.  The amount of contrast in the stomach has diminished.  No disproportionate dilatation of bowel.  Bowel stent is stable in the left side of the abdomen.  IMPRESSION: NG tube placed into the stomach.  Diminished contrast in the stomach.   Original Report Authenticated By: Jolaine Click, M.D.    Dg Abd Portable 1v  12/15/2012  *RADIOLOGY REPORT*  Clinical Data: Check contrast progression through the small bowel.  PORTABLE ABDOMEN - 1 VIEW  Comparison: Abdominal radiograph performed 12/14/2012  Findings: Contrast is again seen filling the stomach, and is now better seen filling the duodenum.  There is distension of the fourth segment of the duodenum to 7.2 cm.  This is highly suspicious for occlusion at the level of the jejunal stent.  No contrast is seen progressing through the jejunal stent.  The visualized bowel gas pattern is grossly unremarkable, though there is a relative paucity of bowel gas within the abdomen.  No definite free abdominal air is identified, though evaluation for free air is suboptimal on a single supine view.  The right-sided cholecystostomy tube is grossly unremarkable in appearance.  Left-sided drains are grossly stable, though the position of the more superior drain has shifted mildly.  No acute osseous abnormalities are identified.  IMPRESSION: Contrast again seen filling the stomach, and better seen filling the duodenum.  Distension of the fourth segment of the duodenum to 7.2 cm.  This is highly suspicious for occlusion at the level of the jejunal stent.  No evidence of contrast progression beyond the jejunal stent.   Original Report Authenticated By: Tonia Ghent, M.D.    Dg Abd Portable 1v  12/14/2012  *RADIOLOGY REPORT*  Clinical Data: Small bowel obstruction; assess for patency of jejunal stent.  PORTABLE ABDOMEN - 1 VIEW  Comparison: Abdominal radiograph performed earlier today at 09:06  p.m.  Findings: Contrast is noted filling the stomach; previously noted contrast within the duodenum is no longer well seen and may have become more dilute, or may have refluxed back into the stomach.  No contrast is seen within the jejunal stent or more distally.  The visualized bowel gas pattern is grossly unremarkable. A right- sided cholecystostomy tube is again noted; left-sided drains are seen.  No acute osseous abnormalities are seen.  Retrocardiac airspace opacification is again noted.  IMPRESSION:  1.  Contrast noted filling the stomach; previously noted contrast within the duodenum is no longer well seen and may have become more dilute, or may have refluxed back into the stomach.  No contrast seen within the jejunal stent or more distally.  To assess for obstruction at the jejunal stent, a formal upper GI series and small bowel follow-through should be performed, when clinically appropriate.  In the meantime, a follow-up film may be considered in 3-4 hours.  2.  Persistent retrocardiac airspace opacification.   Original Report Authenticated By: Tonia Ghent, M.D.    Dg Abd Portable 1v  12/14/2012  *RADIOLOGY REPORT*  Clinical Data: Small bowel follow-through; follow-up image at 3 hours 10 minutes.  PORTABLE ABDOMEN - 1 VIEW  Comparison: Abdominal radiograph performed earlier today at 06:24 p.m.  Findings: Ingested contrast remains mostly within the stomach, with a small amount of contrast seen extending through the duodenum to the level of the jejunal stent.  No definite contrast is seen extending through the majority of the jejunal stent.  The visualized bowel gas pattern is grossly unremarkable, though there is a relative paucity of bowel gas within the abdomen.  A right-sided cholecystostomy tube is grossly unchanged in appearance.  No definite free intra-abdominal air is identified, though evaluation for free air is suboptimal on supine views.  No acute osseous abnormalities are identified.   Persistent retrocardiac airspace opacification is noted.  IMPRESSION:  1.  Ingested contrast remains mostly within the stomach, with a small amount of contrast seen extending through the duodenum to the level of the jejunal stent.  No definite contrast seen extending through the majority of the jejunal stent.  A follow-up image is planned in 2 hours.  2.  Persistent retrocardiac airspace opacification seen.   Original Report Authenticated By: Tonia Ghent, M.D.    Dg Abd Portable 1v  12/13/2012  *RADIOLOGY REPORT*  Clinical Data: Status post proximal jejunal stent placement for stenotic segment.  PORTABLE ABDOMEN - 1 VIEW  Comparison: ERCP images from earlier the same day  Findings: The contrast material which was present in the dilated small bowel just proximal to the stent is not readily evident on this film.  There is some contrast posterior wall in the fundus of the stomach. Unfortunately, it is not possible to know whether the contrast seen in the dilated small bowel proximal to the stent on the ERCP has passed through the stent device or refluxed back up into the stomach.  IMPRESSION: No contrast material visible within dilated small bowel proximal to the stent, but since no contrast can be definitely visualized distal to the stent it is unclear whether the contrast refluxed back into the stomach or has migrated distally through the stent and become diluted out in the more distal small bowel loops.   Original Report Authenticated By: Kennith Center, M.D.    Varney Biles Kayleen Memos [place]  12/14/2012  *RADIOLOGY REPORT*  Clinical Data:  Nausea and vomiting after small bowel wall stent placement.  UPPER GI SERIES WITH KUB  Technique:  Routine upper GI series was performed with thin barium.  Fluoroscopy Time: Zero minutes  Comparison:  None.  Findings: Scout view of the abdomen shows and metallic wall stent  in the left abdomen.  Surgical drain is also seen in the left abdomen. Postoperative changes and a pigtail drain are  seen in the right abdomen.  Relative paucity of gas overall.  An extremely limited examination was performed at the request of the referring physician.  The patient drank one couple of barium. A single AP supine image shows contrast in the stomach.  IMPRESSION: Contrast remains in the stomach after ingestion of one cup of barium.  The patient will drink another half cup of barium and will be reimaged in 2 hours to assess patency of the small bowel stent.   Original Report Authenticated By: Leanna Battles, M.D.    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  UPPER GI SERIES WITH KUB  Technique:  Routine upper GI series was performed with Omnipaque- 300  Fluoroscopy Time: 1.47 minutes  Comparison:  CT scan 12/16/2012  Findings: Contrast was infused via the NG tube in the stomach. Stomach is unremarkable.  Very little peristaltic action was demonstrated but contrast did enter into the duodenum with the patient on the right side and elevated.  Occasional GE reflux was noted.  There was to and fro movement of contrast in the second portion of the duodenum which was mildly dilated.  Very little contrast entered the duodenal stent but a small amount did pass through it.  Very little contrast was seen beyond the stent.  IMPRESSION:  1.  Mild dilatation of the duodenum with very little contrast seen entering the duodenal stent. 2.  No dilated distal small bowel to suggest obstruction.   Original Report Authenticated By: Rudie Meyer, M.D.    Dg C-arm Gt 120 Min  12/11/2012  *RADIOLOGY REPORT*  Clinical Data: Duodenal stricture.  Attempted endoscopy.  DG C-ARM GT 120 MIN,ABDOMEN - 1 VIEW  Technique: 20 minutes and 33 seconds of fluoroscopy was utilized.  Comparison:  Upper GI 12/09/2012.  Findings: Three images are submitted.  The biliary drain and abdominal drain are in place.  The endoscope remains in the stomach, all  three images.  IMPRESSION:  1.  Intraoperative fluoroscopy for guidance of endoscopy.   Original Report Authenticated By: Marin Roberts, M.D.    Dg C-arm 61-120 Min-no Report  12/30/2012  CLINICAL DATA: duodenal stricture   C-ARM 61-120 MINUTES  Fluoroscopy was utilized by the requesting physician.  No radiographic  interpretation.       Microbiology: Recent Results (from the past 240 hour(s))  CATH TIP CULTURE     Status: Normal   Collection Time   01/04/13  2:18 PM      Component Value Range Status Comment   Specimen Description CATH TIP   Final    Special Requests NONE   Final    Culture NO GROWTH 2 DAYS   Final    Report Status 01/06/2013 FINAL   Final   CULTURE, BLOOD (ROUTINE X 2)     Status: Normal (Preliminary result)   Collection Time   01/04/13  6:55 PM      Component Value Range Status Comment   Specimen Description BLOOD LEFT ANTECUBITAL   Final    Special Requests Normal BOTTLES DRAWN AEROBIC AND ANAEROBIC 5CC   Final    Culture  Setup Time 01/05/2013 14:11   Final    Culture     Final    Value:        BLOOD CULTURE RECEIVED NO  GROWTH TO DATE CULTURE WILL BE HELD FOR 5 DAYS BEFORE ISSUING A FINAL NEGATIVE REPORT   Report Status PENDING   Incomplete   CULTURE, BLOOD (ROUTINE X 2)     Status: Normal (Preliminary result)   Collection Time   01/04/13  7:01 PM      Component Value Range Status Comment   Specimen Description BLOOD LEFT ANTECUBITAL   Final    Special Requests Normal BOTTLES DRAWN AEROBIC ONLY 3CC   Final    Culture  Setup Time 01/05/2013 14:10   Final    Culture     Final    Value:        BLOOD CULTURE RECEIVED NO GROWTH TO DATE CULTURE WILL BE HELD FOR 5 DAYS BEFORE ISSUING A FINAL NEGATIVE REPORT   Report Status PENDING   Incomplete      Labs: Basic Metabolic Panel:  Lab 01/09/13 1610 01/08/13 0440 01/07/13 0430 01/06/13 0815 01/04/13 0640 01/03/13 0500  NA 134* 138 143 139 131* --  K 2.9* 3.2* -- -- -- --  CL 107 109 112 109 104 --  CO2 17*  19 19 19  17* --  GLUCOSE 114* 112* 106* 100* 115* --  BUN 34* 28* 24* 26* 35* --  CREATININE 0.97 1.04 1.08 0.99 0.83 --  CALCIUM 8.9 9.6 10.0 10.1 9.6 --  MG -- 1.5 -- 1.6 -- --  PHOS 3.9 4.0 -- 5.0* 4.5 3.1   Liver Function Tests:  Lab 01/08/13 0440 01/06/13 0815  AST 25 30  ALT 43 50  ALKPHOS 375* 390*  BILITOT 0.7 1.0  PROT 6.9 6.8  ALBUMIN 2.2* 2.2*   No results found for this basename: LIPASE:5,AMYLASE:5 in the last 168 hours No results found for this basename: AMMONIA:5 in the last 168 hours CBC:  Lab 01/06/13 0815 01/04/13 1025  WBC 6.1 8.1  NEUTROABS 4.4 --  HGB 8.7* 8.8*  HCT 26.5* 26.5*  MCV 85.8 83.6  PLT 292 274   Cardiac Enzymes: No results found for this basename: CKTOTAL:5,CKMB:5,CKMBINDEX:5,TROPONINI:5 in the last 168 hours BNP: No components found with this basename: POCBNP:5 CBG:  Lab 01/09/13 0555 01/09/13 0009 01/08/13 1754 01/08/13 1228 01/08/13 0605  GLUCAP 107* 109* 107* 115* 115*    Time coordinating discharge:  Greater than 30 minutes  Signed:  Makhayla Mcmurry, DO [facility] Pager: [phone] 01/09/2013, 11:13 AM

## 2013-01-09 LAB — GLUCOSE, CAPILLARY: Glucose-Capillary: 112 mg/dL — ABNORMAL HIGH (ref 70–99)

## 2013-01-09 LAB — BASIC METABOLIC PANEL
Calcium: 8.9 mg/dL (ref 8.4–10.5)
GFR calc Af Amer: >90 mL/min (ref >90)
GFR calc non Af Amer: >90 mL/min (ref >90)
Potassium: 2.9 mEq/L — ABNORMAL LOW (ref 3.5–5.1)
Sodium: 134 mEq/L — ABNORMAL LOW (ref 135–145)

## 2013-01-09 LAB — PHOSPHORUS: Phosphorus: 3.9 mg/dL (ref 2.3–4.6)

## 2013-01-09 MED ORDER — POTASSIUM CHLORIDE 20 MEQ/15ML (10%) PO LIQD
40.0000 meq | Freq: Once | ORAL | Status: AC
  Administered 2013-01-09: 40 meq via ORAL
  Filled 2013-01-09: qty 30

## 2013-01-09 MED ORDER — POTASSIUM CHLORIDE 10 MEQ/100ML IV SOLN
10.0000 meq | INTRAVENOUS | Status: DC
  Filled 2013-01-09 (×6): qty 100

## 2013-01-09 MED ORDER — HEPARIN SOD (PORK) LOCK FLUSH 100 UNIT/ML IV SOLN
INTRAVENOUS | Status: AC
  Filled 2013-01-09: qty 5

## 2013-01-09 MED ORDER — MORPHINE SULFATE (CONCENTRATE) 10 MG /0.5 ML PO SOLN: 5.0000 mg | ORAL | Status: DC | PRN

## 2013-01-09 MED ORDER — POTASSIUM CHLORIDE 10 MEQ/100ML IV SOLN
10.0000 meq | INTRAVENOUS | Status: AC
  Administered 2013-01-09 (×4): 10 meq via INTRAVENOUS
  Filled 2013-01-09 (×4): qty 100

## 2013-01-09 MED ORDER — M.V.I. ADULT IV INJ
INJECTION | INTRAVENOUS | Status: DC
  Filled 2013-01-09: qty 2760

## 2013-01-09 MED ORDER — FAT EMULSION 20 % IV EMUL
250.0000 mL | INTRAVENOUS | Status: DC
  Filled 2013-01-09: qty 250

## 2013-01-09 MED ORDER — POTASSIUM CHLORIDE 2 MEQ/ML IV SOLN
INTRAVENOUS | Status: DC
  Administered 2013-01-09: 08:00:00 via INTRAVENOUS
  Filled 2013-01-09 (×2): qty 1000

## 2013-01-09 NOTE — Progress Notes (Signed)
PARENTERAL NUTRITION CONSULT NOTE - Follow-Up  Pharmacy Consult for TPN  Indication: SBO secondary to peritoneal carcinomatosis  Allergies  Allergen Reactions  . Percocet (Oxycodone-Acetaminophen)     hallucination   Patient Measurements: Height: 6\' 4"  (193 cm) Weight: 204 lb (92.534 kg) IBW/kg (Calculated) : 86.8   Vital Signs: Temp: 98.4 F (36.9 C) (01/24 0550) Temp src: Oral (01/24 0550) BP: 116/73 mmHg (01/24 0550) Pulse Rate: 83  (01/24 0550) Intake/Output from previous day: 01/23 0701 - 01/24 0700 In: 5210 [P.O.:360; I.V.:2311; TPN:2539] Out: 4800 [Urine:900; Drains:3750; Stool:150]  Labs:  [facility] 01/06/13 0815  WBC 6.1  HGB 8.7*  HCT 26.5*  PLT 292  APTT --  INR --     Basename 01/09/13 0355 01/08/13 0440 01/07/13 0430 01/06/13 0815  NA 134* 138 143 --  K 2.9* 3.2* 3.2* --  CL 107 109 112 --  CO2 17* 19 19 --  GLUCOSE 114* 112* 106* --  BUN 34* 28* 24* --  CREATININE 0.97 1.04 1.08 --  LABCREA -- -- -- --  CREAT24HRUR -- -- -- --  CALCIUM 8.9 9.6 10.0 --  MG -- 1.5 -- 1.6  PHOS 3.9 4.0 -- 5.0*  PROT -- 6.9 -- 6.8  ALBUMIN -- 2.2* -- 2.2*  AST -- 25 -- 30  ALT -- 43 -- 50  ALKPHOS -- 375* -- 390*  BILITOT -- 0.7 -- 1.0  BILIDIR -- -- -- --  IBILI -- -- -- --  PREALBUMIN -- -- -- 17.1*  TRIG -- -- -- 146  CHOLHDL -- -- -- --  CHOL -- -- -- 132   Estimated Creatinine Clearance: 104.4 ml/min (by C-G formula based on Cr of 0.97).   Insulin Requirements in the past 24 hours:  Most CBG < 150, requiring 4 units SSI/24hrs, 11 units/L in TPN. (Per Copywriter, advertising med list, patient was on SSI and regular insulin in TPN at 15 units/L.)    Nutritional Goals:   RD recs(1/16):  Kcal: 2600-2800, Protein: 130-140 gm, Fluid: >2.5L/day.    TPN @ [facility] = 2624 Kcals, 140g Protein, fluid (125 ml/hr over 24 hrs)  Goal rate inpatient: Clinimix E5/20 at 110ml/hr + Lipids MWF to provide Protein 138gm and 2909 kcal MWF, 2429 Kcal TTSS for weekly  avg of 2634 kcal.  Current Nutrition:   NPO (as of 12/20/12)  IVF = D5NS @ 75 ml/hr  Assessment: 81 YOM with complex history including terminal peritoneal carcinomatosis s/p chemo.  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 Pt with LLQ drain, LUQ drain, ileostomy, choly drain, NG tube.  Pt transferred to [facility] on 12/20/11 from [facility] for SBO management, N/V, high NG output and TNA was started.  Pt developed hypotension and tachycardia and transferred to ICU on 1/6 with MRSA bacteremia.  ID recommended line removal, but pt initially refused but finally agreed on D/C PICC and [facility] 1/19. PICC replaced 1/22 and TNA resumed.    Electrolytes - K down/low - . Phos high AM 1/21 so TNA changed to e-lyte free formula. Has been wnl last two days. Na low.   CBG: within goal range ~low 100's  LFTs - (1/21) AlkPhos elevated and increased some, AST/ALT and T bili wnl  TGs - (1/13) 110, (1/21) 146  Prealbumin - (1/13) 29, (1/21) 17.1  Plan:  Continue Clinimix 5/15 (electrolyte free formula) at 115 ml/hr   Replete K: KCl IV 70meq/100ml x 6, add 63meq/L of KCl  to MIVF  Continue 11 units/L of regular insulin in TNA.  IV fat emulsion 20% at 10 ml/hr on MWF only due to ongoing shortage.  Standard multivitamins and trace elements on MWF only due to ongoing shortage.  Gwen Her PharmD  [phone] 01/09/2013 7:19 AM

## 2013-01-09 NOTE — Progress Notes (Signed)
Patient has been seen by a Spiritual Care Dept's volunteer, Dedra Skeens. He has not wanted to speak to a chaplain, but his sister knew Velna Hatchet and he asked for a visit from her. She had been meeting with him and offering listening presence.

## 2013-01-09 NOTE — Plan of Care (Signed)
Problem: Discharge Progression Outcomes Goal: Discharge plan in place and appropriate Outcome: Completed/Met Date Met:  01/09/13 Home w/ [facility] and HH.

## 2013-01-09 NOTE — Progress Notes (Signed)
Clinical Social Worker received notification that pt needing ambulance transportation home. Clinical Social Worker completed ambulance transport form and gathered documents and provided to RN to call ambulance transport when pt ready. No further social work needs identified at this time. Clinical Social Worker signing off.   Jacklynn Lewis, MSW, LCSWA  Clinical Social Work [phone]

## 2013-01-09 NOTE — Progress Notes (Signed)
Pt assessment unchanged from this morning; pt medically stable to DC home with [facility] and [facility] per MD orders.  Reviewed DC instructions and meds with pt and pt son; addressed MOST form sections per Dr. [name].  [facility] called ca. [phone] to transport pt home.

## 2013-01-09 NOTE — Progress Notes (Signed)
OT Cancellation Note  Patient Details Name: RADFORD PEASE MRN: [id number] DOB: May 20, 1956   Cancelled Treatment:    Reason Eval/Treat Not Completed: Other (comment) (doesn't feel up to OT.  May be discharged later per pt) Will update goals on next visit if pt remains here.    [address] Marica Otter, OTR/L [phone] 01/09/2013 01/09/2013, 10:31 AM

## 2013-01-09 NOTE — Progress Notes (Signed)
[facility] has arrived.  Called pt son Sharee Holster per his request; called [facility] to inform them also.

## 2013-01-11 LAB — CULTURE, BLOOD (ROUTINE X 2)
Culture: NO GROWTH
Special Requests: NORMAL

## 2013-01-17 ENCOUNTER — Other Ambulatory Visit: Payer: Self-pay

## 2013-01-17 ENCOUNTER — Emergency Department (HOSPITAL_COMMUNITY): Payer: 59

## 2013-01-17 ENCOUNTER — Inpatient Hospital Stay (HOSPITAL_COMMUNITY)
Admission: EM | Admit: 2013-01-17 | Discharge: 2013-01-21 | DRG: 640 | Disposition: A | Discharge status: Other Acute Inpatient Hospital | Payer: 59 | Attending: Internal Medicine | Admitting: Internal Medicine

## 2013-01-17 ENCOUNTER — Encounter (HOSPITAL_COMMUNITY): Payer: Self-pay | Admitting: *Deleted

## 2013-01-17 DIAGNOSIS — K5900 Constipation, unspecified: Secondary | ICD-10-CM

## 2013-01-17 DIAGNOSIS — K810 Acute cholecystitis: Secondary | ICD-10-CM | POA: Diagnosis present

## 2013-01-17 DIAGNOSIS — D61818 Other pancytopenia: Secondary | ICD-10-CM

## 2013-01-17 DIAGNOSIS — Z79899 Other long term (current) drug therapy: Secondary | ICD-10-CM

## 2013-01-17 DIAGNOSIS — K56609 Unspecified intestinal obstruction, unspecified as to partial versus complete obstruction: Secondary | ICD-10-CM

## 2013-01-17 DIAGNOSIS — Z932 Ileostomy status: Secondary | ICD-10-CM

## 2013-01-17 DIAGNOSIS — R6521 Severe sepsis with septic shock: Secondary | ICD-10-CM

## 2013-01-17 DIAGNOSIS — K219 Gastro-esophageal reflux disease without esophagitis: Secondary | ICD-10-CM | POA: Diagnosis present

## 2013-01-17 DIAGNOSIS — R748 Abnormal levels of other serum enzymes: Secondary | ICD-10-CM | POA: Diagnosis present

## 2013-01-17 DIAGNOSIS — E873 Alkalosis: Secondary | ICD-10-CM

## 2013-01-17 DIAGNOSIS — N179 Acute kidney failure, unspecified: Secondary | ICD-10-CM

## 2013-01-17 DIAGNOSIS — Y833 Surgical operation with formation of external stoma as the cause of abnormal reaction of the patient, or of later complication, without mention of misadventure at the time of the procedure: Secondary | ICD-10-CM | POA: Diagnosis present

## 2013-01-17 DIAGNOSIS — R112 Nausea with vomiting, unspecified: Secondary | ICD-10-CM

## 2013-01-17 DIAGNOSIS — R7309 Other abnormal glucose: Secondary | ICD-10-CM | POA: Diagnosis present

## 2013-01-17 DIAGNOSIS — E43 Unspecified severe protein-calorie malnutrition: Secondary | ICD-10-CM

## 2013-01-17 DIAGNOSIS — E860 Dehydration: Secondary | ICD-10-CM

## 2013-01-17 DIAGNOSIS — K812 Acute cholecystitis with chronic cholecystitis: Secondary | ICD-10-CM

## 2013-01-17 DIAGNOSIS — R579 Shock, unspecified: Secondary | ICD-10-CM

## 2013-01-17 DIAGNOSIS — Z931 Gastrostomy status: Secondary | ICD-10-CM

## 2013-01-17 DIAGNOSIS — R739 Hyperglycemia, unspecified: Secondary | ICD-10-CM

## 2013-01-17 DIAGNOSIS — M109 Gout, unspecified: Secondary | ICD-10-CM

## 2013-01-17 DIAGNOSIS — D72829 Elevated white blood cell count, unspecified: Secondary | ICD-10-CM

## 2013-01-17 DIAGNOSIS — C799 Secondary malignant neoplasm of unspecified site: Secondary | ICD-10-CM

## 2013-01-17 DIAGNOSIS — E876 Hypokalemia: Secondary | ICD-10-CM

## 2013-01-17 DIAGNOSIS — J90 Pleural effusion, not elsewhere classified: Secondary | ICD-10-CM

## 2013-01-17 DIAGNOSIS — C786 Secondary malignant neoplasm of retroperitoneum and peritoneum: Secondary | ICD-10-CM

## 2013-01-17 DIAGNOSIS — Z9221 Personal history of antineoplastic chemotherapy: Secondary | ICD-10-CM

## 2013-01-17 DIAGNOSIS — R7989 Other specified abnormal findings of blood chemistry: Secondary | ICD-10-CM

## 2013-01-17 DIAGNOSIS — IMO0002 Reserved for concepts with insufficient information to code with codable children: Secondary | ICD-10-CM

## 2013-01-17 DIAGNOSIS — I10 Essential (primary) hypertension: Secondary | ICD-10-CM | POA: Diagnosis present

## 2013-01-17 DIAGNOSIS — K9423 Gastrostomy malfunction: Secondary | ICD-10-CM | POA: Diagnosis present

## 2013-01-17 DIAGNOSIS — E870 Hyperosmolality and hypernatremia: Principal | ICD-10-CM

## 2013-01-17 DIAGNOSIS — K632 Fistula of intestine: Secondary | ICD-10-CM

## 2013-01-17 DIAGNOSIS — Z8614 Personal history of Methicillin resistant Staphylococcus aureus infection: Secondary | ICD-10-CM

## 2013-01-17 DIAGNOSIS — D75839 Thrombocytosis, unspecified: Secondary | ICD-10-CM

## 2013-01-17 DIAGNOSIS — C801 Malignant (primary) neoplasm, unspecified: Secondary | ICD-10-CM

## 2013-01-17 DIAGNOSIS — D638 Anemia in other chronic diseases classified elsewhere: Secondary | ICD-10-CM

## 2013-01-17 DIAGNOSIS — R9431 Abnormal electrocardiogram [ECG] [EKG]: Secondary | ICD-10-CM

## 2013-01-17 DIAGNOSIS — R7881 Bacteremia: Secondary | ICD-10-CM | POA: Diagnosis present

## 2013-01-17 DIAGNOSIS — C762 Malignant neoplasm of abdomen: Secondary | ICD-10-CM

## 2013-01-17 DIAGNOSIS — R1110 Vomiting, unspecified: Secondary | ICD-10-CM

## 2013-01-17 DIAGNOSIS — A419 Sepsis, unspecified organism: Secondary | ICD-10-CM

## 2013-01-17 DIAGNOSIS — R109 Unspecified abdominal pain: Secondary | ICD-10-CM

## 2013-01-17 DIAGNOSIS — E871 Hypo-osmolality and hyponatremia: Secondary | ICD-10-CM

## 2013-01-17 DIAGNOSIS — K921 Melena: Secondary | ICD-10-CM

## 2013-01-17 DIAGNOSIS — A4101 Sepsis due to Methicillin susceptible Staphylococcus aureus: Secondary | ICD-10-CM

## 2013-01-17 DIAGNOSIS — B9562 Methicillin resistant Staphylococcus aureus infection as the cause of diseases classified elsewhere: Secondary | ICD-10-CM

## 2013-01-17 DIAGNOSIS — C482 Malignant neoplasm of peritoneum, unspecified: Secondary | ICD-10-CM | POA: Diagnosis present

## 2013-01-17 DIAGNOSIS — D473 Essential (hemorrhagic) thrombocythemia: Secondary | ICD-10-CM | POA: Diagnosis present

## 2013-01-17 HISTORY — DX: Anemia in other chronic diseases classified elsewhere: D63.8

## 2013-01-17 HISTORY — DX: Unspecified severe protein-calorie malnutrition: E43

## 2013-01-17 HISTORY — DX: Severe sepsis with septic shock: R65.21

## 2013-01-17 HISTORY — DX: Sepsis due to methicillin susceptible Staphylococcus aureus: A41.01

## 2013-01-17 HISTORY — DX: Bacteremia: R78.81

## 2013-01-17 HISTORY — DX: Unspecified intestinal obstruction, unspecified as to partial versus complete obstruction: K56.609

## 2013-01-17 HISTORY — DX: Fistula of intestine: K63.2

## 2013-01-17 LAB — COMPREHENSIVE METABOLIC PANEL WITH GFR
ALT: 89 U/L — ABNORMAL HIGH (ref 0–53)
AST: 55 U/L — ABNORMAL HIGH (ref 0–37)
Albumin: 2.9 g/dL — ABNORMAL LOW (ref 3.5–5.2)
Alkaline Phosphatase: 355 U/L — ABNORMAL HIGH (ref 39–117)
BUN: 87 mg/dL — ABNORMAL HIGH (ref 6–23)
CO2: 44 meq/L (ref 19–32)
Calcium: 13.4 mg/dL (ref 8.4–10.5)
Chloride: 90 meq/L — ABNORMAL LOW (ref 96–112)
Creatinine, Ser: 1.02 mg/dL (ref 0.50–1.35)
GFR calc Af Amer: >90 mL/min
GFR calc non Af Amer: 80 mL/min — ABNORMAL LOW
Glucose, Bld: 127 mg/dL — ABNORMAL HIGH (ref 70–99)
Potassium: 3.7 meq/L (ref 3.5–5.1)
Sodium: 143 meq/L (ref 135–145)
Total Bilirubin: 1 mg/dL (ref 0.3–1.2)
Total Protein: 8.5 g/dL — ABNORMAL HIGH (ref 6.0–8.3)

## 2013-01-17 LAB — URINE MICROSCOPIC-ADD ON

## 2013-01-17 LAB — CBC WITH DIFFERENTIAL/PLATELET
Basophils Absolute: 0 10*3/uL (ref 0.0–0.1)
Basophils Relative: 0 % (ref 0–1)
Eosinophils Absolute: 0.2 K/uL (ref 0.0–0.7)
Eosinophils Relative: 1 % (ref 0–5)
HCT: 32 % — ABNORMAL LOW (ref 39.0–52.0)
Hemoglobin: 10.2 g/dL — ABNORMAL LOW (ref 13.0–17.0)
Lymphocytes Relative: 10 % — ABNORMAL LOW (ref 12–46)
Lymphs Abs: 1.7 K/uL (ref 0.7–4.0)
MCH: 28.1 pg (ref 26.0–34.0)
MCHC: 31.9 g/dL (ref 30.0–36.0)
MCV: 88.2 fL (ref 78.0–100.0)
Monocytes Absolute: 2.9 K/uL — ABNORMAL HIGH (ref 0.1–1.0)
Monocytes Relative: 16 % — ABNORMAL HIGH (ref 3–12)
Neutro Abs: 12.9 10*3/uL — ABNORMAL HIGH (ref 1.7–7.7)
Neutrophils Relative %: 73 % (ref 43–77)
Platelets: 459 10*3/uL — ABNORMAL HIGH (ref 150–400)
RBC: 3.63 MIL/uL — ABNORMAL LOW (ref 4.22–5.81)
RDW: 16.3 % — ABNORMAL HIGH (ref 11.5–15.5)
WBC: 17.7 10*3/uL — ABNORMAL HIGH (ref 4.0–10.5)

## 2013-01-17 LAB — URINALYSIS, ROUTINE W REFLEX MICROSCOPIC
Glucose, UA: NEGATIVE mg/dL
Hgb urine dipstick: NEGATIVE
Ketones, ur: NEGATIVE mg/dL
Nitrite: NEGATIVE
Protein, ur: 30 mg/dL — AB
Specific Gravity, Urine: 1.025 (ref 1.005–1.030)
Urobilinogen, UA: 0.2 mg/dL (ref 0.0–1.0)
pH: 5 (ref 5.0–8.0)

## 2013-01-17 LAB — BLOOD GAS, ARTERIAL
Acid-Base Excess: 18.9 mmol/L — ABNORMAL HIGH (ref 0.0–2.0)
Bicarbonate: 44.9 meq/L — ABNORMAL HIGH (ref 20.0–24.0)
Drawn by: 331471
O2 Saturation: 95 %
Patient temperature: 98.6
TCO2: 40.3 mmol/L (ref 0–100)
pCO2 arterial: 55.2 mmHg — ABNORMAL HIGH (ref 35.0–45.0)
pH, Arterial: 7.521 — ABNORMAL HIGH (ref 7.350–7.450)
pO2, Arterial: 70.3 mmHg — ABNORMAL LOW (ref 80.0–100.0)

## 2013-01-17 LAB — LIPASE, BLOOD: Lipase: 81 U/L — ABNORMAL HIGH (ref 11–59)

## 2013-01-17 LAB — MRSA PCR SCREENING: MRSA by PCR: NEGATIVE

## 2013-01-17 MED ORDER — MORPHINE SULFATE 4 MG/ML IJ SOLN
4.0000 mg | INTRAMUSCULAR | Status: DC | PRN
  Administered 2013-01-17 (×2): 4 mg via INTRAVENOUS
  Filled 2013-01-17 (×2): qty 1

## 2013-01-17 MED ORDER — CHLORHEXIDINE GLUCONATE 0.12 % MT SOLN
15.0000 mL | Freq: Two times a day (BID) | OROMUCOSAL | Status: DC
  Administered 2013-01-17 – 2013-01-21 (×7): 15 mL via OROMUCOSAL
  Filled 2013-01-17 (×9): qty 15

## 2013-01-17 MED ORDER — VANCOMYCIN HCL IN DEXTROSE 1-5 GM/200ML-% IV SOLN
1000.0000 mg | INTRAVENOUS | Status: DC
  Administered 2013-01-17 – 2013-01-20 (×4): 1000 mg via INTRAVENOUS
  Filled 2013-01-17 (×5): qty 200

## 2013-01-17 MED ORDER — PAMIDRONATE DISODIUM 90 MG/10ML IV SOLN
90.0000 mg | Freq: Once | INTRAVENOUS | Status: AC
  Administered 2013-01-17: 90 mg via INTRAVENOUS
  Filled 2013-01-17: qty 10

## 2013-01-17 MED ORDER — ONDANSETRON HCL 4 MG/2ML IJ SOLN
4.0000 mg | Freq: Four times a day (QID) | INTRAMUSCULAR | Status: DC | PRN
  Administered 2013-01-17 – 2013-01-20 (×2): 4 mg via INTRAVENOUS
  Filled 2013-01-17 (×2): qty 2

## 2013-01-17 MED ORDER — ACETAMINOPHEN 650 MG RE SUPP
650.0000 mg | Freq: Four times a day (QID) | RECTAL | Status: DC | PRN
  Administered 2013-01-18: 650 mg via RECTAL
  Filled 2013-01-17: qty 1

## 2013-01-17 MED ORDER — MENTHOL 3 MG MT LOZG: 1.0000 | LOZENGE | OROMUCOSAL | Status: DC | PRN

## 2013-01-17 MED ORDER — BIOTENE DRY MOUTH MT LIQD
15.0000 mL | Freq: Two times a day (BID) | OROMUCOSAL | Status: DC
  Administered 2013-01-17 – 2013-01-21 (×9): 15 mL via OROMUCOSAL

## 2013-01-17 MED ORDER — ENOXAPARIN SODIUM 40 MG/0.4ML ~~LOC~~ SOLN
40.0000 mg | SUBCUTANEOUS | Status: DC
  Filled 2013-01-17: qty 0.4

## 2013-01-17 MED ORDER — PANTOPRAZOLE SODIUM 40 MG IV SOLR
40.0000 mg | Freq: Two times a day (BID) | INTRAVENOUS | Status: DC
  Administered 2013-01-17 – 2013-01-21 (×8): 40 mg via INTRAVENOUS
  Filled 2013-01-17 (×9): qty 40

## 2013-01-17 MED ORDER — ACETAMINOPHEN 325 MG PO TABS: 650.0000 mg | ORAL_TABLET | Freq: Four times a day (QID) | ORAL | Status: DC | PRN

## 2013-01-17 MED ORDER — NITROGLYCERIN 0.4 MG SL SUBL: 0.4000 mg | SUBLINGUAL_TABLET | SUBLINGUAL | Status: DC | PRN

## 2013-01-17 MED ORDER — MORPHINE SULFATE (CONCENTRATE) 10 MG /0.5 ML PO SOLN: 5.0000 mg | ORAL | Status: DC | PRN

## 2013-01-17 MED ORDER — LORAZEPAM 2 MG/ML IJ SOLN
0.5000 mg | Freq: Once | INTRAMUSCULAR | Status: AC
  Administered 2013-01-17: 0.5 mg via INTRAVENOUS
  Filled 2013-01-17: qty 1

## 2013-01-17 MED ORDER — SODIUM CHLORIDE 0.9 % IV SOLN
Freq: Once | INTRAVENOUS | Status: AC
  Administered 2013-01-17: 11:00:00 via INTRAVENOUS

## 2013-01-17 MED ORDER — ONDANSETRON HCL 4 MG/2ML IJ SOLN
4.0000 mg | Freq: Once | INTRAMUSCULAR | Status: AC
  Administered 2013-01-17: 4 mg via INTRAVENOUS
  Filled 2013-01-17: qty 2

## 2013-01-17 MED ORDER — SODIUM CHLORIDE 0.9 % IV BOLUS (SEPSIS)
1000.0000 mL | Freq: Once | INTRAVENOUS | Status: AC
  Administered 2013-01-17: 1000 mL via INTRAVENOUS

## 2013-01-17 MED ORDER — ENOXAPARIN SODIUM 40 MG/0.4ML ~~LOC~~ SOLN
40.0000 mg | SUBCUTANEOUS | Status: DC
  Administered 2013-01-17 – 2013-01-20 (×4): 40 mg via SUBCUTANEOUS
  Filled 2013-01-17 (×5): qty 0.4

## 2013-01-17 MED ORDER — ONDANSETRON HCL 4 MG PO TABS: 4.0000 mg | ORAL_TABLET | Freq: Four times a day (QID) | ORAL | Status: DC | PRN

## 2013-01-17 MED ORDER — PREGABALIN 25 MG PO CAPS
25.0000 mg | ORAL_CAPSULE | Freq: Two times a day (BID) | ORAL | Status: DC
Start: 2013-01-17 — End: 2013-01-21
  Administered 2013-01-18 – 2013-01-21 (×6): 25 mg via ORAL
  Filled 2013-01-17 (×6): qty 1

## 2013-01-17 MED ORDER — SODIUM CHLORIDE 0.9 % IV SOLN
INTRAVENOUS | Status: DC
  Administered 2013-01-17 – 2013-01-18 (×2): via INTRAVENOUS

## 2013-01-17 NOTE — Progress Notes (Signed)
ANTIBIOTIC CONSULT NOTE - INITIAL  Pharmacy Consult for Vanco Indication: MRSA Bacteremia  Allergies  Allergen Reactions  . Percocet (Oxycodone-Acetaminophen)     hallucination    Patient Measurements: Height: 6\' 4"  (193 cm) Weight: 178 lb 2.1 oz (80.8 kg) IBW/kg (Calculated) : 86.8   Vital Signs: Temp: 97.2 F (36.2 C) (02/01 1549) Temp src: Oral (02/01 1549) BP: 117/86 mmHg (02/01 1549) Pulse Rate: 108  (02/01 1549) Intake/Output from previous day:   Intake/Output from this shift: Total I/O In: 480 [P.O.:480] Out: 900 [Other:900]  Labs:  Basename 01/17/13 1055  WBC 17.7*  HGB 10.2*  PLT 459*  LABCREA --  CREATININE 1.02   Estimated Creatinine Clearance: 92.4 ml/min (by C-G formula based on Cr of 1.02). No results found for this basename: VANCOTROUGH:2,VANCOPEAK:2,VANCORANDOM:2,GENTTROUGH:2,GENTPEAK:2,GENTRANDOM:2,TOBRATROUGH:2,TOBRAPEAK:2,TOBRARND:2,AMIKACINPEAK:2,AMIKACINTROU:2,AMIKACIN:2, in the last 72 hours   Microbiology: Recent Results (from the past 720 hour(s))  CULTURE, BLOOD (ROUTINE X 2)     Status: Normal   Collection Time   12/22/12 12:55 PM      Component Value Range Status Comment   Specimen Description BLOOD LEFT HAND   Final    Special Requests BOTTLES DRAWN AEROBIC AND ANAEROBIC [facility]   Final    Culture  Setup Time 12/22/2012 22:48   Final    Culture     Final    Value: METHICILLIN RESISTANT STAPHYLOCOCCUS AUREUS     Note: RIFAMPIN AND GENTAMICIN SHOULD NOT BE USED AS SINGLE DRUGS FOR TREATMENT OF STAPH INFECTIONS. CRITICAL RESULT CALLED TO, READ BACK BY AND VERIFIED WITH: JENNIFER FUQUAY @ 1258 12/24/12 BY KRAWS     Note: Gram Stain Report Called to,Read Back By and Verified With: Burnell Blanks @ 1254 12/23/12 BY KRAWS   Report Status 12/25/2012 FINAL   Final    Organism ID, Bacteria METHICILLIN RESISTANT STAPHYLOCOCCUS AUREUS   Final   CULTURE, BLOOD (ROUTINE X 2)     Status: Normal   Collection Time   12/22/12  1:05 PM      Component Value Range  Status Comment   Specimen Description BLOOD LEFT HAND   Final    Special Requests BOTTLES DRAWN AEROBIC AND ANAEROBIC [facility]   Final    Culture  Setup Time 12/22/2012 22:48   Final    Culture     Final    Value: STAPHYLOCOCCUS AUREUS     Note: SUSCEPTIBILITIES PERFORMED ON PREVIOUS CULTURE WITHIN THE LAST 5 DAYS.     Note: Gram Stain Report Called to,Read Back By and Verified With: KIM Richardson Dopp @ 1254 12/23/12 BY KRAWS   Report Status 12/25/2012 FINAL   Final   MRSA PCR SCREENING     Status: Abnormal   Collection Time   12/22/12  5:42 PM      Component Value Range Status Comment   MRSA by PCR POSITIVE (*) NEGATIVE Final   CULTURE, BLOOD (ROUTINE X 2)     Status: Normal   Collection Time   12/27/12  4:30 AM      Component Value Range Status Comment   Specimen Description BLOOD RIGHT PICC   Final    Special Requests BOTTLES DRAWN AEROBIC AND ANAEROBIC 5CC   Final    Culture  Setup Time 12/27/2012 13:21   Final    Culture NO GROWTH 5 DAYS   Final    Report Status 01/02/2013 FINAL   Final   CATH TIP CULTURE     Status: Normal   Collection Time   01/04/13  2:18 PM  Component Value Range Status Comment   Specimen Description CATH TIP   Final    Special Requests NONE   Final    Culture NO GROWTH 2 DAYS   Final    Report Status 01/06/2013 FINAL   Final   CULTURE, BLOOD (ROUTINE X 2)     Status: Normal   Collection Time   01/04/13  6:55 PM      Component Value Range Status Comment   Specimen Description BLOOD LEFT ANTECUBITAL   Final    Special Requests Normal BOTTLES DRAWN AEROBIC AND ANAEROBIC 5CC   Final    Culture  Setup Time 01/05/2013 14:11   Final    Culture NO GROWTH 5 DAYS   Final    Report Status 01/11/2013 FINAL   Final   CULTURE, BLOOD (ROUTINE X 2)     Status: Normal   Collection Time   01/04/13  7:01 PM      Component Value Range Status Comment   Specimen Description BLOOD LEFT ANTECUBITAL   Final    Special Requests Normal BOTTLES DRAWN AEROBIC ONLY 3CC   Final    Culture   Setup Time 01/05/2013 14:10   Final    Culture NO GROWTH 5 DAYS   Final    Report Status 01/11/2013 FINAL   Final     Medical History: Past Medical History  Diagnosis Date  . Hypertension   . GERD (gastroesophageal reflux disease)   . Small bowel obstruction 2013  . 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 Acute cholecystitis s/p perc draianage [id number] 12/20/2012  . Anemia of chronic disease 12/19/2012  . 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 MRSA bacteremia 01/04/2013  . SBO (small bowel obstruction) 11/07/2011  . Septic shock due to Staphylococcus aureus 12/24/2012  . Severe protein-calorie malnutrition 01/17/2013    Medications:  Anti-infectives    None     Home Antibiotics: Vanco 1g IV daily - last given 1/31 at 9am per patient report.  Assessment: 57 yo M with recent hospitalization from 1/3 - 01/09/13 for MSRA bacteremia (reportedly on IV vanco through 02/15/13). Pt admitted now for dehydration secondary to nausea and vomiting. Pt is on Vanco 1g IV q24h - last given 1/31 at 9am per pt report. During previous admission, pt had a trough of 21.6 while on 750mg  IV q12h, with SCr similar now and at that time. Will resume home dose of Vanco and check a trough at steady state.   Goal of Therapy:  Vancomycin trough level 15-20 mcg/ml  Plan:  1) Vanco 1g IV q24h 2) Will plan to check a trough at steady state.  Darrol Angel, PharmD Pager: [phone] 01/17/2013,4:12 PM

## 2013-01-17 NOTE — ED Provider Notes (Signed)
History     CSN: [id number]  Arrival date & time 01/17/13  1015   First MD Initiated Contact with Patient 01/17/13 1023      Chief Complaint  Patient presents with  . Abdominal Pain    (Consider location/radiation/quality/duration/timing/severity/associated sxs/prior treatment) HPI Comments: Patient reports sudden onset of left sided upper abdominal pain associated with intermittent nausea. 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 Patient is primarily being treated at [facility] with Dr. [name]. He reports he does not have a primary care physician here in [place]. Patient has both an ileostomy and a gastric tube that is used for decompression of his intestine. He is also fed clear liquids by mouth and TPN through his PICC line. He has not eaten any solid food by mouth. Patient reports he has been receiving intake, however he reports a dry mouth and feels dehydrated. He denies lightheadedness or syncope. He denies any chest pain, cough. He has no prior history of asthma or COPD. He denies history of coronary disease. He is taking 0.25 ML's of morphine at home for chronic pain but reports that that is not helping him at this time. Patient cannot recall the last time he had any significant output from his ileostomy. Based on prior records, the patient has a partial CODE STATUS. When asked, the patient would not like to be intubated but is agreeable for CPR, chest compressions and defibrillation.  Patient is a 57 y.o. male presenting with abdominal pain. The history is provided by the patient.  Abdominal Pain The primary symptoms of the illness include abdominal pain, shortness of breath and nausea. The primary symptoms of the illness do not include vomiting or diarrhea.  Additional symptoms associated with the illness include constipation.    Past Medical History  Diagnosis Date  . Hypertension   . GERD (gastroesophageal reflux disease)   .  Anemia   . Small bowel obstruction 2013  . 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 Acute cholecystitis s/p perc draianage [id number] 12/20/2012    Past Surgical History  Procedure Date  . Tonsillectomy   . Esophagogastroduodenoscopy 12/11/2012    Procedure: ESOPHAGOGASTRODUODENOSCOPY (EGD);  Surgeon: Louis Meckel, MD;  Location: [facility] ENDOSCOPY;  Service: Endoscopy;  Laterality: N/A;  . Duodenal stent placement 12/11/2012    Procedure: DUODENAL STENT PLACEMENT;  Surgeon: Louis Meckel, MD;  Location: [facility] ENDOSCOPY;  Service: Endoscopy;  Laterality: N/A;  . 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 Peg placement 12/12/2012    Procedure: PERCUTANEOUS ENDOSCOPIC GASTROSTOMY (PEG) PLACEMENT;  Surgeon: Louis Meckel, MD;  Location: [facility] ENDOSCOPY;  Service: Endoscopy;  Laterality: N/A;  . Small intestine surgery jan 2013    [facility].  SB resection, ileostomy, gastrostomy  . Debulking sep 2013    [facility].  ileal SB resection, debulking of peritoneal carcinomatosis, intraperitoneal chemotherapy  . Colon surgery oct 2013    [facility] transverse colon perforation  . Enteroscopy 12/30/2012    Procedure: ENTEROSCOPY;  Surgeon: Louis Meckel, MD;  Location: [facility] ENDOSCOPY;  Service: Endoscopy;  Laterality: N/A;  With possible stent placement; please have duodenal stent available  . Duodenal stent placement 12/30/2012    Procedure: DUODENAL STENT PLACEMENT;  Surgeon: Louis Meckel, MD;  Location: [facility] ENDOSCOPY;  Service: Endoscopy;  Laterality: N/A;    No family history on file.  History  Substance Use Topics  . Smoking status: Never Smoker   . Smokeless tobacco: Never Used  . Alcohol Use: No     Comment: Stopped in July      Review of Systems   Respiratory: Positive for shortness of breath.   Gastrointestinal: Positive for nausea, abdominal pain and constipation. Negative for vomiting and diarrhea.  All other systems reviewed and are negative.    Allergies  Percocet  Home Medications   Current Outpatient Rx  Name  Route  Sig  Dispense  Refill  . MENTHOL 3 MG MT LOZG   Oral   Take 1 lozenge by mouth every 2 (two) hours as needed. Sore throat         . MORPHINE SULFATE (CONCENTRATE) 10 MG /0.5 ML PO SOLN   Oral   Take 0.25 mLs (5 mg total) by mouth every hour as needed for pain.   30 mL   0   . PANTOPRAZOLE SODIUM 40 MG IV SOLR   Intravenous   Inject 40 mg into the vein every 12 (twelve) hours.         Marland Kitchen PREGABALIN 25 MG PO CAPS   Oral   Take 25 mg by mouth 2 (two) times daily.         Marland Kitchen METOCLOPRAMIDE HCL 5 MG/ML IJ SOLN   Intravenous   Inject 10 mg into the vein 4 (four) times daily.         Marland Kitchen NITROGLYCERIN 0.4 MG SL SUBL   Sublingual   Place 0.4 mg under the tongue every 5 (five) minutes as needed. For chest pain, Hold for SBP<100         . ONDANSETRON HCL 4 MG/2ML IJ SOLN   Intravenous   Inject 4 mg into the vein 4 (four) times daily.         Marland Kitchen PROMETHAZINE HCL 25 MG/ML IJ SOLN   Intravenous   Inject 25 mg into the vein every 8 (eight) hours as needed. Nausea/vomiting         . UNABLE TO FIND      Vancomycin per [facility] protocol for labs and dosing   1 Units   0   . UNABLE TO FIND      TPN per [facility] protocol for labs and dosing   1 Units   0     BP 124/88  Pulse 106  Temp 97.4 F (36.3 C) (Oral)  Resp 18  SpO2 97%  Physical Exam  Nursing note and vitals reviewed. Constitutional: He appears well-developed and well-nourished. He has a sickly appearance. No distress.       Pt is belching repetatively  HENT:  Head: Normocephalic and atraumatic.  Eyes: No scleral icterus.  Neck: Normal range of motion. Neck supple.  Cardiovascular: Regular rhythm.   No extrasystoles are  present. Tachycardia present.   No murmur heard. Pulses:      Radial pulses are 1+ on the right side, and 1+ on the left side.  Pulmonary/Chest: Effort normal. No respiratory distress. He has no wheezes. He has no rales.  Abdominal: Soft. He exhibits no distension. Bowel sounds are decreased. There is tenderness in the epigastric area and left upper quadrant. There is no rebound, no guarding, no CVA tenderness, no tenderness at McBurney's point and negative Murphy's sign. No hernia.  Musculoskeletal:       Clubbing is present  Neurological: He  is alert.  Skin: No rash noted. He is diaphoretic. There is pallor.       Peripheral extremities are cool to touch and slightly clammy    ED Course  Procedures (including critical care time)  Labs Reviewed  CBC WITH DIFFERENTIAL - Abnormal; Notable for the following:    WBC 17.7 (*)     RBC 3.63 (*)     Hemoglobin 10.2 (*)     HCT 32.0 (*)     RDW 16.3 (*)     Platelets 459 (*)     Neutro Abs 12.9 (*)     Lymphocytes Relative 10 (*)     Monocytes Relative 16 (*)     Monocytes Absolute 2.9 (*)     All other components within normal limits  COMPREHENSIVE METABOLIC PANEL - Abnormal; Notable for the following:    Chloride 90 (*)     CO2 44 (*)     Glucose, Bld 127 (*)     BUN 87 (*)     Calcium 13.4 (*)     Total Protein 8.5 (*)     Albumin 2.9 (*)     AST 55 (*)     ALT 89 (*)     Alkaline Phosphatase 355 (*)     GFR calc non Af Amer 80 (*)     All other components within normal limits  LIPASE, BLOOD - Abnormal; Notable for the following:    Lipase 81 (*)     All other components within normal limits  URINALYSIS, ROUTINE W REFLEX MICROSCOPIC - Abnormal; Notable for the following:    Color, Urine AMBER (*)  BIOCHEMICALS MAY BE AFFECTED BY COLOR   APPearance CLOUDY (*)     Bilirubin Urine SMALL (*)     Protein, ur 30 (*)     Leukocytes, UA MODERATE (*)     All other components within normal limits  URINE MICROSCOPIC-ADD ON -  Abnormal; Notable for the following:    Squamous Epithelial / LPF FEW (*)     All other components within normal limits  BLOOD GAS, ARTERIAL   Dg Abd Acute W/chest  01/17/2013  *RADIOLOGY REPORT*  Clinical Data: Nausea, vomiting, peritoneal carcinoma.  ACUTE ABDOMEN SERIES (ABDOMEN 2 VIEW & CHEST 1 VIEW)  Comparison: 12/25/2012 and earlier studies  Findings: Right arm PICC extends to the proximal right atrium. Interval increase in left pleural effusion with increasing atelectasis/consolidation in the left lower lung.  Right lung clear.  Heart size upper limits normal.  Gastrostomy tube projects in expected location.  No free air. Pigtail drain in the right mid abdomen stable.  Enteric stent in the left mid abdomen stable.  Surgical drain projects laterally in the left abdomen as before.  There is a relative paucity of bowel gas.  No dilated loops of bowel are evident.  No abnormal abdominal calcifications.  Anastomotic staple line in the right mid abdomen.  IMPRESSION:  1.  Interval increase in the moderate left pleural effusion with adjacent consolidation / atelectasis. 2.  Nonobstructive bowel gas pattern. 3.  No free air. 4.  Postop changes as above.   Original Report Authenticated By: D. Andria Rhein, MD      1. Pleural effusion   2. Abdominal pain   3. Carcinomatosis peritonei   4. Dehydration   5. Hypercalcemia of malignancy     RA sat is 97% and I interpret to be normal.  11:33 AM I reviewed three-view abdominal acute serious myself. Based  on interpretation, no apparent bowel obstruction is seen. However he does have a large pleural effusion which could be related to his cancer versus infection. Pt is afebrile here, the patient does have an elevated white count of 17. Urinalysis shows moderate leukocyte esterase however no significant pyuria. Consideration to put the patient on IV antibiotics to cover healthcare associated pneumonia and admission for symptoms including severe abd pain not  controlled at home.  In review of records from last admission, he was seen by multiple specialists all of whom report that his prognosis is terminal and that surgery, chemo are not going to improve outcomes.  Palliative care was involved at the time. Alternatively, if pt is feeling improved, could start him on abx after triad consultation and discharge to home given poor long term benefits.      12:47 PM Pt's Ca is 13, BUN at 81 highly suggestive of dehydration, will continue IVF's and consult for admission.    EKG at time 13:07, shows sinus tachycardia at a rate of 106. Axis is normal. Multiple PVCs are noted. Nonspecific ST and T wave abnormality noted in inferior and lateral leads, possibly a repoll abnormality. PVCs and nonspecific changes are changed compared to EKG from 11/25/2012.   1:35 PM Spoke to Dr. Darnelle Catalan who agrees on admission.  MDM  I have reviewed EPIC notes.  Pt with previously told terminal cancer.  He reports that he has been told otherwise and is planning on going to [place] to see another specialist.  Pt with no obv stool output, belching, nauseated, at times gagging, I suspect pt has SBO again.  Pt has had NGT in the past. Will give IVF's for now, check acute abd series, likely requires admission.          Gavin Pound. Carinne Brandenburger, MD 01/17/13 1336

## 2013-01-17 NOTE — H&P (Signed)
[facility] History and Physical  [name] ZOX:[id number] DOB: October 27, 1956 DOA: 01/17/2013  Referring physician: Dr. Quita Skye PCP: [name], MD   Chief Complaint: N/V, dehydration   History of Present Illness: [name] is an 57 y.o. 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 [name] which has left him with multiple drains and fistulas, recently hospitalized 12/19/12-01/10/12 for MRSA bacteremia from line sepsis versus cholangitis (on IV vancomycin through 02/15/2013), on chronic TPN via a PICC line, chronic PEG for gastric decompression, history of small bowel obstruction status post jejunal stent, ileostomy, cholecystitis status post cholecystectomy and chronic indwelling drain.  He was sent home with home health nursing care and did fairly well up until the last 24 hours, when he developed nausea, vomiting, and left sided upper abdominal pain. He denies any fever or chills. He has an ileostomy and reports that his output has not been black or bloody. The patient's family reports that his liquid intake has been poor.  Review of Systems: Constitutional: No fever, no chills;  Appetite normal; No weight loss, no weight gain.  HEENT: No blurry vision, no diplopia, no pharyngitis, no dysphagia CV: No chest pain, no palpitations.  Resp: + SOB, + non-productive cough. GI: + nausea and vomiting, no diarrhea, no melena, no hematochezia.  GU: No dysuria, no hematuria.  MSK: no myalgias, no arthralgias.  Neuro:  No headache, no focal neurological deficits, no history of seizures.  Psych: No depression, no anxiety.  Endo: No thyroid disease, no DM, no heat intolerance, no cold intolerance, no polyuria, no polydipsia  Skin: No rashes, no skin lesions.  Heme: No easy bruising, no history of blood diseases.  Past Medical History Past Medical History   Diagnosis Date  . Hypertension   . GERD (gastroesophageal reflux disease)   . Small bowel obstruction 2013  . 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 Acute cholecystitis s/p perc draianage [id number] 12/20/2012  . Anemia of chronic disease 12/19/2012  . 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 MRSA bacteremia 01/04/2013  . SBO (small bowel obstruction) 11/07/2011  . Septic shock due to Staphylococcus aureus 12/24/2012  . Severe protein-calorie malnutrition 01/17/2013     Past Surgical History Past Surgical History  Procedure Date  . Tonsillectomy   . Esophagogastroduodenoscopy 12/11/2012    Procedure: ESOPHAGOGASTRODUODENOSCOPY (EGD);  Surgeon: Louis Meckel, MD;  Location: [facility] ENDOSCOPY;  Service: Endoscopy;  Laterality: N/A;  . Duodenal stent placement 12/11/2012    Procedure: DUODENAL STENT PLACEMENT;  Surgeon: Louis Meckel, MD;  Location: [facility] ENDOSCOPY;  Service: Endoscopy;  Laterality: N/A;  . Esophagogastroduodenoscopy 12/13/2012    Procedure: ESOPHAGOGASTRODUODENOSCOPY (EGD);  Surgeon: Louis Meckel, MD;  Location: [facility] OR;  Service: Endoscopy;  Laterality: N/A;  with attempt to stent duodenal stricture  . Peg placement 12/12/2012    Procedure: PERCUTANEOUS ENDOSCOPIC GASTROSTOMY (PEG) PLACEMENT;  Surgeon: Louis Meckel, MD;  Location: [facility] ENDOSCOPY;  Service: Endoscopy;  Laterality: N/A;  . Small intestine surgery jan 2013    [facility].  SB resection, ileostomy, gastrostomy  . Debulking sep 2013    [facility].  ileal SB resection, debulking of peritoneal carcinomatosis, intraperitoneal chemotherapy  . Colon surgery oct 2013    [facility] transverse colon perforation  . Enteroscopy 12/30/2012    Procedure: ENTEROSCOPY;  Surgeon: Louis Meckel, MD;  Location: [facility] ENDOSCOPY;  Service: Endoscopy;  Laterality: N/A;  With possible stent placement; please have duodenal stent available  . Duodenal stent  placement 12/30/2012    Procedure: DUODENAL STENT PLACEMENT;  Surgeon: Louis Meckel, MD;  Location: [facility] ENDOSCOPY;  Service: Endoscopy;  Laterality: N/A;     Social History: History   Social History  . Marital Status: Married    Spouse Name: Dora Sims    Number of Children: 1  . Years of Education: N/A   Occupational History  . Disabled, prior contract work    Social History Main Topics  . Smoking status: Never Smoker   . Smokeless tobacco: Never Used  . Alcohol Use: No     Comment: Stopped in July  . Drug Use: No  . Sexually Active: Yes   Other Topics Concern  . Not on file   Social History Narrative   Married.  Lives in [place] with wife.  Ambulates with a walker.    Family History:  Family History  Problem Relation Age of Onset  . Lung cancer Brother   . Hypertension Mother   . Hypertension Father   . Heart failure Father     Allergies: Percocet  Meds: Prior to Admission medications   Medication Sig Start Date End Date Taking? Authorizing Provider  menthol-cetylpyridinium (CEPACOL) 3 MG lozenge Take 1 lozenge by mouth every 2 (two) hours as needed. Sore throat   Yes Historical Provider, MD  Morphine Sulfate (MORPHINE CONCENTRATE) 10 mg / 0.5 ml concentrated solution Take 5 mg by mouth every hour as needed. Pain 01/09/13  Yes Catarina Hartshorn, MD  nitroGLYCERIN (NITROSTAT) 0.4 MG SL tablet Place 0.4 mg under the tongue every 5 (five) minutes as needed. For chest pain, Hold for SBP<100   Yes Historical Provider, MD  ondansetron (ZOFRAN) 4 MG tablet Take 4 mg by mouth every 8 (eight) hours as needed. Nausea   Yes Historical Provider, MD  pantoprazole (PROTONIX) 40 MG injection Inject 40 mg into the vein every 12 (twelve) hours.   Yes Historical Provider, MD  Parenteral Electrolytes (TPN ELECTROLYTES FTV IV) Inject 3,000 mLs into the vein daily. 10ml addamel, 10ml MVI, 30 units insulin   Yes Historical Provider, MD  pregabalin (LYRICA) 25 MG capsule Take 25 mg by mouth 2  (two) times daily.   Yes Historical Provider, MD  sodium chloride 0.9 % SOLN 250 mL with vancomycin 1000 MG SOLR 1,000 mg Inject 1,000 mg into the vein daily. Administered by Hospice care giver   Yes Historical Provider, MD    Physical Exam: Filed Vitals:   01/17/13 1023 01/17/13 1309 01/17/13 1357  BP: 124/88 110/72 107/74  Pulse: 106 110 102  Temp: 97.4 F (36.3 C)  97.7 F (36.5 C)  TempSrc: Oral  Oral  Resp: 18 18 19   SpO2: 97% 100% 95%     Physical Exam: Blood pressure 107/74, pulse 102, temperature 97.7 F (36.5 C), temperature source Oral, resp. rate 19, SpO2 95.00%. Gen: No acute distress. Head: Normocephalic, atraumatic. Eyes: PERRL, EOMI, sclerae nonicteric. Mouth: Oropharynx with moist mucous membranes. No exudates. Neck: Supple, no thyromegaly, no lymphadenopathy, no jugular venous distention. Chest: Lungs clear to auscultation bilaterally. CV: Heart with tachycardic rate, regular rhythm. No murmurs, rubs, or gallops Abdomen: No abdominal distention. Diminished bowel sounds. Tender in the epigastric area and left upper quadrant. No rebound or guarding. He has a gastrostomy tube hooked up to suction, a wound fistula covered with an  ostomy pouch draining purulent material, and ileostomy with white mucousy-type material, and a cholecystostomy tube present. Extremities: Extremities show clubbing but no edema or cyanosis. Skin: Warm and dry. No rashes. Neuro: Alert and oriented times 3; cranial nerves II through XII grossly intact. Psych: Mood and affect depressed.  Labs on Admission:  Basic Metabolic Panel:  Lab 01/17/13 9604  NA 143  K 3.7  CL 90*  CO2 44*  GLUCOSE 127*  BUN 87*  CREATININE 1.02  CALCIUM 13.4*  MG --  PHOS --   Liver Function Tests:  Lab 01/17/13 1055  AST 55*  ALT 89*  ALKPHOS 355*  BILITOT 1.0  PROT 8.5*  ALBUMIN 2.9*    Lab 01/17/13 1055  LIPASE 81*  AMYLASE --   CBC:  Lab 01/17/13 1055  WBC 17.7*  NEUTROABS 12.9*  HGB  10.2*  HCT 32.0*  MCV 88.2  PLT 459*    Radiological Exams on Admission: Dg Abd Acute W/chest  01/17/2013  *RADIOLOGY REPORT*  Clinical Data: Nausea, vomiting, peritoneal carcinoma.  ACUTE ABDOMEN SERIES (ABDOMEN 2 VIEW & CHEST 1 VIEW)  Comparison: 12/25/2012 and earlier studies  Findings: Right arm PICC extends to the proximal right atrium. Interval increase in left pleural effusion with increasing atelectasis/consolidation in the left lower lung.  Right lung clear.  Heart size upper limits normal.  Gastrostomy tube projects in expected location.  No free air. Pigtail drain in the right mid abdomen stable.  Enteric stent in the left mid abdomen stable.  Surgical drain projects laterally in the left abdomen as before.  There is a relative paucity of bowel gas.  No dilated loops of bowel are evident.  No abnormal abdominal calcifications.  Anastomotic staple line in the right mid abdomen.  IMPRESSION:  1.  Interval increase in the moderate left pleural effusion with adjacent consolidation / atelectasis. 2.  Nonobstructive bowel gas pattern. 3.  No free air. 4.  Postop changes as above.   Original Report Authenticated By: D. Andria Rhein, MD     EKG: Independently reviewed. Sinus tachycardia at 106 beats per minute. Premature ventricular complexes. Nonspecific T-wave changes in the lateral leads.  Assessment/Plan Principal Problem:  *Dehydration secondary to nausea and vomiting  Likely from malfunctioning of his G-tube or suction apparatus.  Hooked up to suction in the emergency department with a large amount of gastric fluid drained.  Plain abdominal films done on admission did not show any evidence of obstruction.  Hydrate and monitor electrolytes. Active Problems:  Recent MRSA bacteremia  Continue vancomycin through 02/15/2013.  SBO (small bowel obstruction)  Continue G-tube suction.  Leukocytosis  Likely reflective of dehydration and volume contraction. Monitor.  Peritoneal  carcinomatosis  Poor prognosis.  Anemia of chronic disease  Hemoglobin stable. No current indication for transfusion.  Colo-enteric fistula  Wound ostomy RN consultation for help with chronic wounds and fistulas.  Acute cholecystitis s/p perc draianage [id number]  Drain intact. Drain changed 12/22/2012.  Severe protein-calorie malnutrition  Continue TNA.  Elevated lipase  Likely from carcinomatosis.  Thrombocytosis  Likely reactive or reflective of dehydration. Hydrate and monitor.  Elevated LFTs  Likely from liver metastasis and intra-abdominal process.  Pleural effusion, left  Likely a malignant effusion. Consider thoracentesis for symptomatic treatment.  Hypercalcemia of malignancy  Hydrate. Give a dose of pamidronate.  Code Status: LCB, no intubation but does want CPR/defibrillation/drugs Family Communication: Dimarco Minkin (wife) [phone] updated at bedside. Disposition Plan: Home when stable.  Time spent: 70 minutes.  Hyacinth Marcelli [facility] Pager [phone]  If 7PM-7AM, please contact night-coverage [email or web address] Password [facility] 01/17/2013, 2:53 PM

## 2013-01-17 NOTE — ED Notes (Signed)
Gastrostomy tube connected to intermittent suction

## 2013-01-17 NOTE — ED Notes (Addendum)
Per ems: pt from home, hx of small bowel obstruction. LUQ abd pain x2 days. Pt reports SOB with abd pain, nausea and vomiting. Has ileostomy bag and catheter

## 2013-01-17 NOTE — Progress Notes (Signed)
PARENTERAL NUTRITION CONSULT NOTE - Initial  Pharmacy Consult for TPN Indication: SBO secondary to peritoneal carcinomatosis  Allergies  Allergen Reactions  . Percocet (Oxycodone-Acetaminophen)     hallucination   Patient Measurements: Height: 6\' 4"  (193 cm) Weight: 178 lb 2.1 oz (80.8 kg) IBW/kg (Calculated) : 86.8   Vital Signs: Temp: 97.2 F (36.2 C) (02/01 1549) Temp src: Oral (02/01 1549) BP: 117/86 mmHg (02/01 1549) Pulse Rate: 108  (02/01 1549) Intake/Output from previous day:    Labs:  Basename 01/17/13 1055  WBC 17.7*  HGB 10.2*  HCT 32.0*  PLT 459*  APTT --  INR --     Basename 01/17/13 1055  NA 143  K 3.7  CL 90*  CO2 44*  GLUCOSE 127*  BUN 87*  CREATININE 1.02  LABCREA --  CREAT24HRUR --  CALCIUM 13.4*  MG --  PHOS --  PROT 8.5*  ALBUMIN 2.9*  AST 55*  ALT 89*  ALKPHOS 355*  BILITOT 1.0  BILIDIR --  IBILI --  PREALBUMIN --  TRIG --  CHOLHDL --  CHOL --   Estimated Creatinine Clearance: 92.4 ml/min (by C-G formula based on Cr of 1.02).   Insulin Requirements in the past 24 hours:  None (most recent home TNA did not contain insulin per report from [facility])   Nutritional Goals:   RD recs(from previous admission on 01/01/13):  Kcal: 2600-2800, Protein: 130-140 gm, Fluid: >2.5L/day.    Home TNA from [facility] = 3089 Kcals, 126g Protein, fluid (125 ml/hr over 24 hrs)  For now (pending RD assessment), Goal rate inpatient: Clinimix E5/20 at 15ml/hr + Lipids MWF to provide Protein 138gm and 2909 kcal MWF, 2429 Kcal TTSS for weekly avg of 2634 kcal.  Current Nutrition:   CLD  IVF = NS @ 128ml/hr  Assessment: 53 YOM with complex history including terminal peritoneal carcinomatosis s/p chemo.  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  Pt with LLQ drain, LUQ drain, ileostomy, choly drain, NG tube.  Previously, patient was transferred to [facility] on 12/20/11 from [facility] for SBO management, N/V,  high NG output.  Patient was ultimately discharged on 01/10/12 on TNA (as well as IV Vanco for MRSA line infection). Readmitted 01/17/13 with dehydration due to N/V. Per patient, home TNA runs over 24 hours and is hung at 9am, last hung on 1/31. Patient reported that the TNA was disconnected this am right before he was brought to the hospital. There is no TNA hanging currently in the patient's room.   Since TNA per Rx was ordered past the 12:30 cut off time, and since labs are abnormal (hypercalcemia, ordered pamidronate) and elevated LFTs, will not hang a TNA bag tonight  Plan will be to recheck TNA labs in am and proceed from there (start TNA tomorrow 2/2 at 18:00)  Pt currently has NS @ 150ml/hr ordered.   Plan:  No TNA tonight (ordered past 12:30 cut-off time)  TNA labs in am  Pharmacy to reassess labs and start TNA 2/2 at 18:00  Continue NS at 170ml/hr as ordered by MD  Darrol Angel, PharmD Pager: [phone] 01/17/2013,4:39 PM

## 2013-01-17 NOTE — Progress Notes (Addendum)
Pt came up from ED at 1600. Pt was complaining of abdominal pain and nausea. Pt had JP drain placed with no bulb. Called down to OR and they sent up a new bulb. Placed bulb to JP drain and recharged. Pt had two episodes of vomitus (he feels that it is because he drank his water too fast), treated him with IV zofran and feels better now. Gave pt morphine for pain, pt feels comfortable at this time. Will continue to monitor.

## 2013-01-17 NOTE — ED Notes (Signed)
WUJ:[id number]<JY> Expected date:01/17/13<BR> Expected time:10:19 AM<BR> Means of arrival:Ambulance<BR> Comments:<BR> abd pain ca pt, left lower quad pain, ? placement

## 2013-01-17 NOTE — ED Notes (Addendum)
Pt aware of the need for a urine sample. 

## 2013-01-17 NOTE — ED Notes (Signed)
Hospice at [facility] [phone]

## 2013-01-17 NOTE — Progress Notes (Signed)
Pt bed alarm will not set. Called facilities and they believe that the bed needs to be zeroed out with patient out of bed. As pt is bedridden and cannot get out of bed, we will continue to monitor him with frequent checks. Pt is comfortable at this time and I feel it is unnecessary to make him move at this time. Bed is in lowest position and pt promised to call if he needs help with anything. Will keep door open and continue to monitor closely.

## 2013-01-18 DIAGNOSIS — E870 Hyperosmolality and hypernatremia: Secondary | ICD-10-CM | POA: Diagnosis present

## 2013-01-18 DIAGNOSIS — R739 Hyperglycemia, unspecified: Secondary | ICD-10-CM | POA: Diagnosis present

## 2013-01-18 DIAGNOSIS — E873 Alkalosis: Secondary | ICD-10-CM | POA: Diagnosis present

## 2013-01-18 LAB — COMPREHENSIVE METABOLIC PANEL
ALT: 74 U/L — ABNORMAL HIGH (ref 0–53)
Albumin: 2.5 g/dL — ABNORMAL LOW (ref 3.5–5.2)
Alkaline Phosphatase: 326 U/L — ABNORMAL HIGH (ref 39–117)
GFR calc Af Amer: 69 mL/min — ABNORMAL LOW (ref >90)
Glucose, Bld: 142 mg/dL — ABNORMAL HIGH (ref 70–99)
Potassium: 3.3 mEq/L — ABNORMAL LOW (ref 3.5–5.1)
Sodium: 147 mEq/L — ABNORMAL HIGH (ref 135–145)
Total Protein: 7.2 g/dL (ref 6.0–8.3)

## 2013-01-18 LAB — TRIGLYCERIDES: Triglycerides: 197 mg/dL — ABNORMAL HIGH (ref <150)

## 2013-01-18 LAB — CHLORIDE, URINE, RANDOM: Chloride Urine: <24 mEq/L

## 2013-01-18 LAB — CBC
Hemoglobin: 9.8 g/dL — ABNORMAL LOW (ref 13.0–17.0)
MCHC: 30.7 g/dL (ref 30.0–36.0)
WBC: 12.1 10*3/uL — ABNORMAL HIGH (ref 4.0–10.5)

## 2013-01-18 LAB — DIFFERENTIAL
Basophils Absolute: 0 10*3/uL (ref 0.0–0.1)
Lymphocytes Relative: 8 % — ABNORMAL LOW (ref 12–46)
Neutro Abs: 9.4 10*3/uL — ABNORMAL HIGH (ref 1.7–7.7)

## 2013-01-18 LAB — OSMOLALITY, URINE: Osmolality, Ur: 718 mOsm/kg (ref 390–1090)

## 2013-01-18 LAB — NA AND K (SODIUM & POTASSIUM), RAND UR: Potassium Urine: 37 mEq/L

## 2013-01-18 LAB — CHOLESTEROL, TOTAL: Cholesterol: 152 mg/dL (ref 0–200)

## 2013-01-18 MED ORDER — CLINIMIX/DEXTROSE (5/15) 5 % IV SOLN
INTRAVENOUS | Status: AC
  Administered 2013-01-18: 18:00:00 via INTRAVENOUS
  Filled 2013-01-18: qty 2000

## 2013-01-18 MED ORDER — DEXTROSE 5 % IV SOLN: INTRAVENOUS | Status: DC

## 2013-01-18 MED ORDER — DEXTROSE 5 % IV BOLUS
500.0000 mL | Freq: Four times a day (QID) | INTRAVENOUS | Status: DC
  Administered 2013-01-19: 1125 mL via INTRAVENOUS
  Administered 2013-01-19 (×2): 1000 mL via INTRAVENOUS
  Administered 2013-01-19: 565 mL via INTRAVENOUS
  Administered 2013-01-19: 1000 mL via INTRAVENOUS
  Administered 2013-01-20: 240 mL via INTRAVENOUS
  Administered 2013-01-20: 500 mL via INTRAVENOUS
  Administered 2013-01-20: 240 mL via INTRAVENOUS
  Administered 2013-01-20 – 2013-01-21 (×3): 500 mL via INTRAVENOUS

## 2013-01-18 MED ORDER — DEXTROSE 5 % IV SOLN
INTRAVENOUS | Status: DC
  Administered 2013-01-18: 1000 mL via INTRAVENOUS

## 2013-01-18 MED ORDER — POTASSIUM CHLORIDE 10 MEQ/50ML IV SOLN
10.0000 meq | INTRAVENOUS | Status: AC
  Administered 2013-01-18 (×4): 10 meq via INTRAVENOUS
  Filled 2013-01-18 (×4): qty 50

## 2013-01-18 MED ORDER — INSULIN ASPART 100 UNIT/ML ~~LOC~~ SOLN
0.0000 [IU] | SUBCUTANEOUS | Status: DC
  Administered 2013-01-18 – 2013-01-19 (×2): 1 [IU] via SUBCUTANEOUS
  Administered 2013-01-19: 2 [IU] via SUBCUTANEOUS

## 2013-01-18 MED ORDER — SODIUM CHLORIDE 0.9 % IV SOLN
INTRAVENOUS | Status: DC
  Administered 2013-01-18: 1000 mL via INTRAVENOUS

## 2013-01-18 NOTE — Progress Notes (Signed)
CRITICAL VALUE ALERT  Critical value received:  CO2 43  Date of notification: 01/18/13  Time of notification:  0630  Critical value read back:Yes Nurse who received alert: Kenton Kingfisher, RN  MD notified (1st page):  N/A; Lab consistent with value from yesterday (CO2 44 yesterday; MD aware)  Time of first page: N/A ; Lab value actually dropped slightly from yesterday MD notified (2nd page): n/a  Time of second page:n/a  Responding MD:  n/a  Time MD responded:  n/a

## 2013-01-18 NOTE — Progress Notes (Addendum)
PARENTERAL NUTRITION CONSULT NOTE - Follow UP  Pharmacy Consult for TPN (to begin 2/2pm) Indication: SBO secondary to peritoneal carcinomatosis  Allergies  Allergen Reactions  . Percocet (Oxycodone-Acetaminophen)     hallucination   Patient Measurements: Height: 6\' 4"  (193 cm) Weight: 178 lb 2.1 oz (80.8 kg) IBW/kg (Calculated) : 86.8   Vital Signs: Temp: 97.3 F (36.3 C) (02/01 2100) Temp src: Oral (02/01 2100) BP: 130/94 mmHg (02/01 2100) Pulse Rate: 98  (02/01 2100) Intake/Output from previous day: 02/01 0701 - 02/02 0700 In: 3520 [P.O.:1320; I.V.:1360; IV Piggyback:200] Out: 8400 [Urine:100; Drains:6500]  Labs:  [facility] 01/18/13 0542 01/17/13 1055  WBC 12.1* 17.7*  HGB 9.8* 10.2*  HCT 31.9* 32.0*  PLT 344 459*  APTT -- --  INR -- --     Basename 01/18/13 0542 01/17/13 1055  NA 147* 143  K 3.3* 3.7  CL 96 90*  CO2 43* 44*  GLUCOSE 142* 127*  BUN 82* 87*  CREATININE 1.31 1.02  LABCREA -- --  CREAT24HRUR -- --  CALCIUM 11.2* 13.4*  MG 1.9 --  PHOS 5.4* --  PROT 7.2 8.5*  ALBUMIN 2.5* 2.9*  AST 40* 55*  ALT 74* 89*  ALKPHOS 326* 355*  BILITOT 1.3* 1.0  BILIDIR -- --  IBILI -- --  PREALBUMIN -- --  TRIG -- --  CHOLHDL -- --  CHOL -- --   Estimated Creatinine Clearance: 72 ml/min (by C-G formula based on Cr of 1.31).   Insulin Requirements in the past 24 hours:  None (most recent home TNA did not contain insulin per report from [facility])   Nutritional Goals:   RD recs (from previous admission on 01/01/13):  Kcal: 2600-2800, Protein: 130-140 gm, Fluid: >2.5L/day.    Home TNA from [facility] provides 3089 Kcals on lipid days (Tues and Thurs), 2090 kcal on non-lipid days (MWFSS), 126g Protein, and fluid per day (125 ml/hr over 24 hrs)  For now (pending RD assessment), Goal rate inpatient: Clinimix E5/20 at 13ml/hr + Lipids MWF to provide Protein 138gm and 2909 kcal MWF, 2429 Kcal TTSS for weekly avg of 2634 kcal.  Current Nutrition:    CLD  IVF = D5 @ 184ml/hr  Per Dr. Darnelle Catalan v.o. 2/2am, for now, please target a total of 150 ml/hr total fluid (IVF + TNA +/- lipids)  Assessment: 56 YOM with complex history including terminal peritoneal carcinomatosis s/p chemo.  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  Pt with LLQ drain, LUQ drain, ileostomy, choly drain, NG tube.  Previously, patient was transferred to [facility] on 12/19/12 from [facility] for SBO management, N/V, high NG output.  Patient was ultimately discharged on 01/09/13 on TNA (as well as IV Vanco for MRSA line infection). Readmitted 01/17/13 with dehydration due to N/V. Per patient, home TNA runs over 24 hours and is hung at 9am, last hung on 1/31. Patient reported that the TNA was disconnected 2/1 am right before he was brought to the hospital. There is no TNA hanging currently in the patient's room. Since TNA per Rx was ordered past the 12:30 cut off time yesterday, plan is to start TNA tonight 2/2pm.  Labs:  Hypernatremia - MD changed IVF to D5W already.   Hypokalemia - MD has already ordered IV KCl today  Hypercalcemia - trending down from yesterday (13.4 --> 11.2). Patient received Pamidronate 90mg  IV on 2/1pm.  Hyperphosphatemia - will need to use electrolyte-free TNA until lab abnormalities are  corrected.  Elevated LFTs - trending down  IVF: Per verbal discussion with Dr. Darnelle Catalan on 2/2am, for now, goal total fluid rate (TNA + IVF +/- lipids) = 138ml/hr. Currently has D5W@100ml /hr.  For now, will use electrolyte-free TNA (Clinimix 5/15%) until electrolytes are corrected. Will start TNA at 31ml/hr tonight since pt has been without TNA since 2/1am, to make sure he tolerates it. Ultimate goal is ~117ml/hr of Clin E 5/20 (or more, if electrolyte free TNA is required). Also, added calorie source of D5W as IVF will need to be taken into consideration.  Plan:  Electrolyte-free Clin 5/15 at 63ml/hr to start at 18:00 tonight  Decrease  D5W to 70 ml/hr at 18:00 (total fluid = 172ml/hr per MD)  Start CBGs and SSI q4h after TNA begins  TNA labs on Mon and Thursdays  Darrol Angel, PharmD Pager: [phone] 01/18/2013,8:06 AM

## 2013-01-18 NOTE — Progress Notes (Signed)
Partial Code.  Probably related admission.  Hospice dx Metastatic adenocarcinoma.  Patient awake sitting up in the bed.  Reports feeling much better since rehydration was started yesterday.  Denies any pain, nausea or vomiting.  States he feels stronger today.  Believes he will be discharged home in a couple of days.  Spoke with Horton Chin, SW who will make a follow up visit today to offer support to patient and family.  Please call [facility] [phone] with any questions or concerns.  Willette Pa, RN [facility]

## 2013-01-18 NOTE — Consult Note (Signed)
WOC ostomy consult  Stoma type/location: ileostomy and midline fistula. Managed at home with 1pc ostomy pouch to the ileostomy and with small Eakin pouch to the midline.  These supplies have been ordered for staff. Pouches intact at current time.  Stomal assessment/size: unable to visualize stoma thru pouch today Output yellow liquid from the ileostomy currently, light green liquid from the fistula Ostomy pouching: 1pc flat ostomy pouch ordered for the ileostomy and new Eakin's pouches ordered.  Will provide report to [facility] team for follow up this week as needed for assistance with pouch changes. Pt reports HHRN changes weekly for him he is not sure when the last time they have been changed.    Cherilyn Sautter [place] RN,CWOCN [phone]

## 2013-01-18 NOTE — Progress Notes (Signed)
MD, please do not forget to place an order to consult WOC RN.  Patient's illeostomy and Eco drain will need to be changed at some point and we need assistance on ordering these supplies as well as instructions on how to change these bags and how often.  Patient did not bring any supplies with him.  Illeostomy bag was labeled [place] [phone] (checked with portable equipment and there were none in the hospital).  JP drain, Bili drain, and PEG tube dressings have been changed.[name]

## 2013-01-18 NOTE — Progress Notes (Signed)
[facility] On-Call MSW visit. Patient was sitting with bed with his eyes closed, but easily aroused. Patient advised that he was feeling much better. He verbalized his desire to go home. He advised that his siblings will be taking turns staying with him during the day at home, while his wife works. No family was at the bedside, but MSW called his wife Celine Mans to extend support and assess her needs. She stated that her husband seemed to be doing much better since hydration was started. She advised the plan is for patient to return home post discharge. His siblings came up with a schedule to stay with patient during the day while his wife works. Sonja verbalized relief that they have all finally decided to help. On-call MSW met with wife and son yesterday in the emergency department and she was quite tearful as she was overwhelmed with the changes in patient's condition which has caused increased care needs and the lack of support by his siblings/family. Her son, who was patient's primary caretaker during the day is leaving for [place] on Monday or Tuesday and the wife was concern about how patient was going to be cared for. She was strongly considering a SNF for patient, however was afraid that his siblings would not support such a placement.  Primary Team MSW, Althia Forts will be updated for follow-up. Team will continue to follow patient, provide education and support to patient's wife. Please call [facility], [phone], with any questions/concerns or changes in patient's condition.  Monica P. Lonon,LCSWA

## 2013-01-18 NOTE — Progress Notes (Addendum)
[facility] PROGRESS NOTE  JABEN BENEGAS ZOX:[id number] DOB: June 28, 1956 DOA: 01/17/2013 PCP: [name], MD  Brief narrative: [name] is an 57 y.o. 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 [name] which has left him with multiple drains and fistulas, recently hospitalized 12/19/12-01/10/12 for MRSA bacteremia from line sepsis versus cholangitis (on IV vancomycin through 02/15/2013), on chronic TPN via a PICC line, chronic PEG for gastric decompression, history of small bowel obstruction status post jejunal stent, ileostomy, cholecystitis status post cholecystostomy and chronic indwelling drain. He was sent home with home health nursing care and did fairly well up until 01/17/13,  when he presented to the ER with nausea, vomiting, and left sided upper abdominal pain.  Assessment/Plan: Principal Problem:  *Dehydration secondary to nausea and vomiting   N/V likely from malfunctioning of his G-tube or suction apparatus.   Hooked up to suction in the emergency department with a large amount of gastric fluid drained.   Plain abdominal films done on admission did not show any evidence of obstruction.   Continue to hydrate and monitor electrolytes.  N/V resolved. Active Problems: Hyperphosphatemia  Pharmacy to manage by adjusting TNA. Hyperglycemia  Secondary to TNA. Metabolic alkalosis  Secondary to gastric hydrogen loss from chronic G-tube drainage.  Some respiratory compensation noted. Hypernatremia  Secondary to dehydration.  Check urine osmolality.  Spoke with Dr. Arta Silence of nephrology who recommends replacing output with D5W and running NS at 100 cc/hr. Hypokalemia  Will give K runs x 4.  Recent MRSA bacteremia   Continue vancomycin through 02/15/2013. SBO (small bowel obstruction)   Continue G-tube suction. Leukocytosis    Likely reflective of dehydration and volume contraction. Improving with IVF. Peritoneal carcinomatosis   Poor prognosis. We'll let the patient's oncologist know of his admission in the morning. Anemia of chronic disease   Hemoglobin stable. No current indication for transfusion. Colo-enteric fistula   Wound ostomy RN consultation ordered for help with chronic wounds and fistulas. Acute cholecystitis s/p perc draianage [id number]   Drain intact. Drain changed 12/22/2012. Severe protein-calorie malnutrition   Continue TNA. Elevated lipase   Likely from carcinomatosis. Thrombocytosis   Likely reactive or reflective of dehydration. Resolved with hydration. Elevated LFTs   Likely from liver metastasis and intra-abdominal process. Pleural effusion, left   Likely a malignant effusion. Consider thoracentesis for symptomatic treatment. Hypercalcemia of malignancy   Hydrating. Given a dose of pamidronate 01/17/13 with improvement in calcium level.   Code Status: LCB, no intubation but does want CPR/defibrillation/drugs  Family Communication: Lovell Nuttall (wife) [phone] updated at bedside.  Disposition Plan: Home when stable.  Medical Consultants:  Telephone consultation with Dr. Delano Metz, Nephrology.  Other Consultants:  Wound care RN  Anti-infectives:  Vancomycin x 6 weeks (to finish 02/15/13)  HPI/Subjective: Mr. Putman says that his nausea and vomiting as well as abdominal pain have now resolved. Nursing staff reports that he is drinking large quantities of liquids necessitating that the section canister be changed every hour.  Objective: Filed Vitals:   01/17/13 1309 01/17/13 1357 01/17/13 1549 01/17/13 2100  BP: 110/72 107/74 117/86 130/94  Pulse: 110 102 108 98  Temp:  97.7 F (36.5 C) 97.2 F (36.2 C) 97.3 F (36.3 C)  TempSrc:  Oral Oral Oral  Resp: 18 19 18 16   Height:   6\' 4"  (1.93 m)   Weight:   80.8 kg (178 lb 2.1 oz)  SpO2: 100% 95% 99% 97%     Intake/Output Summary (Last 24 hours) at 01/18/13 5784 Last data filed at 01/18/13 0600  Gross per 24 hour  Intake   3520 ml  Output   8400 ml  Net  -4880 ml    Exam: Gen:  NAD Cardiovascular:  RRR, No M/R/G Respiratory:  Lungs CTAB Gastrointestinal:  Abdomen soft, NT, G tube, ileostomy, ostomy draining fistula and cholecystostomy tubes intact.   Extremities:  No C/E/C  Data Reviewed: Basic Metabolic Panel:  Lab 01/18/13 6962 01/17/13 1055  NA 147* 143  K 3.3* 3.7  CL 96 90*  CO2 43* 44*  GLUCOSE 142* 127*  BUN 82* 87*  CREATININE 1.31 1.02  CALCIUM 11.2* 13.4*  MG 1.9 --  PHOS 5.4* --   GFR Estimated Creatinine Clearance: 72 ml/min (by C-G formula based on Cr of 1.31). Liver Function Tests:  Lab 01/18/13 0542 01/17/13 1055  AST 40* 55*  ALT 74* 89*  ALKPHOS 326* 355*  BILITOT 1.3* 1.0  PROT 7.2 8.5*  ALBUMIN 2.5* 2.9*    Lab 01/17/13 1055  LIPASE 81*  AMYLASE --   CBC:  Lab 01/18/13 0542 01/17/13 1055  WBC 12.1* 17.7*  NEUTROABS 9.4* 12.9*  HGB 9.8* 10.2*  HCT 31.9* 32.0*  MCV 89.4 88.2  PLT 344 459*   Microbiology Recent Results (from the past 240 hour(s))  MRSA PCR SCREENING     Status: Normal   Collection Time   01/17/13  6:05 PM      Component Value Range Status Comment   MRSA by PCR NEGATIVE  NEGATIVE Final      Procedures and Diagnostic Studies:  Dg Abd Acute W/chest  01/17/2013  *RADIOLOGY REPORT*  Clinical Data: Nausea, vomiting, peritoneal carcinoma.  ACUTE ABDOMEN SERIES (ABDOMEN 2 VIEW & CHEST 1 VIEW)  Comparison: 12/25/2012 and earlier studies  Findings: Right arm PICC extends to the proximal right atrium. Interval increase in left pleural effusion with increasing atelectasis/consolidation in the left lower lung.  Right lung clear.  Heart size upper limits normal.  Gastrostomy tube projects in expected location.  No free air. Pigtail drain in the right mid abdomen stable.  Enteric stent in the left mid abdomen stable.  Surgical  drain projects laterally in the left abdomen as before.  There is a relative paucity of bowel gas.  No dilated loops of bowel are evident.  No abnormal abdominal calcifications.  Anastomotic staple line in the right mid abdomen.  IMPRESSION:  1.  Interval increase in the moderate left pleural effusion with adjacent consolidation / atelectasis. 2.  Nonobstructive bowel gas pattern. 3.  No free air. 4.  Postop changes as above.   Original Report Authenticated By: D. Andria Rhein, MD    Scheduled Meds:   . antiseptic oral rinse  15 mL Mouth Rinse q12n4p  . chlorhexidine  15 mL Mouth/Throat BID  . enoxaparin (LOVENOX) injection  40 mg Subcutaneous Q24H  . pantoprazole  40 mg Intravenous Q12H  . pregabalin  25 mg Oral BID  . vancomycin  1,000 mg Intravenous Q24H   Continuous Infusions:   . sodium chloride 100 mL/hr at 01/18/13 0258    Time spent: 35 minutes.   LOS: 1 day   Maecy Podgurski  [facility] Pager [phone].  If 8PM-8AM, please contact night-coverage at [email or web address], password [facility] 01/18/2013, 7:26 AM

## 2013-01-19 LAB — COMPREHENSIVE METABOLIC PANEL
AST: 31 U/L (ref 0–37)
AST: 31 U/L (ref 0–37)
Albumin: 2.2 g/dL — ABNORMAL LOW (ref 3.5–5.2)
Alkaline Phosphatase: 259 U/L — ABNORMAL HIGH (ref 39–117)
Alkaline Phosphatase: 247 U/L — ABNORMAL HIGH (ref 39–117)
BUN: 74 mg/dL — ABNORMAL HIGH (ref 6–23)
BUN: 69 mg/dL — ABNORMAL HIGH (ref 6–23)
CO2: 36 mEq/L — ABNORMAL HIGH (ref 19–32)
CO2: 36 mEq/L — ABNORMAL HIGH (ref 19–32)
Calcium: 10 mg/dL (ref 8.4–10.5)
Chloride: 92 mEq/L — ABNORMAL LOW (ref 96–112)
Chloride: 92 mEq/L — ABNORMAL LOW (ref 96–112)
Creatinine, Ser: 1.55 mg/dL — ABNORMAL HIGH (ref 0.50–1.35)
Creatinine, Ser: 1.47 mg/dL — ABNORMAL HIGH (ref 0.50–1.35)
GFR calc Af Amer: 60 mL/min — ABNORMAL LOW (ref >90)
GFR calc non Af Amer: 48 mL/min — ABNORMAL LOW (ref >90)
Total Bilirubin: 1.2 mg/dL (ref 0.3–1.2)

## 2013-01-19 LAB — CBC WITH DIFFERENTIAL/PLATELET
Basophils Absolute: 0 10*3/uL (ref 0.0–0.1)
HCT: 27.9 % — ABNORMAL LOW (ref 39.0–52.0)
Hemoglobin: 8.9 g/dL — ABNORMAL LOW (ref 13.0–17.0)
Lymphocytes Relative: 7 % — ABNORMAL LOW (ref 12–46)
Monocytes Absolute: 0.6 10*3/uL (ref 0.1–1.0)
Monocytes Relative: 8 % (ref 3–12)
Neutro Abs: 5.9 10*3/uL (ref 1.7–7.7)
WBC: 7.2 10*3/uL (ref 4.0–10.5)

## 2013-01-19 LAB — GLUCOSE, CAPILLARY
Glucose-Capillary: 106 mg/dL — ABNORMAL HIGH (ref 70–99)
Glucose-Capillary: 144 mg/dL — ABNORMAL HIGH (ref 70–99)
Glucose-Capillary: 156 mg/dL — ABNORMAL HIGH (ref 70–99)

## 2013-01-19 LAB — DIFFERENTIAL
Eosinophils Absolute: 0.2 10*3/uL (ref 0.0–0.7)
Eosinophils Relative: 3 % (ref 0–5)
Lymphs Abs: 0.9 10*3/uL (ref 0.7–4.0)
Monocytes Absolute: 0.8 10*3/uL (ref 0.1–1.0)
Monocytes Relative: 11 % (ref 3–12)

## 2013-01-19 LAB — CBC
Hemoglobin: 8.6 g/dL — ABNORMAL LOW (ref 13.0–17.0)
MCH: 27.9 pg (ref 26.0–34.0)
MCV: 86 fL (ref 78.0–100.0)
Platelets: 269 10*3/uL (ref 150–400)
RBC: 3.08 MIL/uL — ABNORMAL LOW (ref 4.22–5.81)

## 2013-01-19 LAB — VANCOMYCIN, TROUGH: Vancomycin Tr: 15.7 ug/mL (ref 10.0–20.0)

## 2013-01-19 MED ORDER — POTASSIUM CHLORIDE 10 MEQ/50ML IV SOLN
10.0000 meq | INTRAVENOUS | Status: AC
  Administered 2013-01-19 (×4): 10 meq via INTRAVENOUS
  Filled 2013-01-19 (×4): qty 50

## 2013-01-19 MED ORDER — DICLOFENAC SODIUM 1 % TD GEL
2.0000 g | Freq: Two times a day (BID) | TRANSDERMAL | Status: DC | PRN
  Administered 2013-01-19: 2 g via TOPICAL
  Filled 2013-01-19: qty 100

## 2013-01-19 MED ORDER — POTASSIUM CHLORIDE 10 MEQ/50ML IV SOLN
10.0000 meq | INTRAVENOUS | Status: DC
  Administered 2013-01-19: 10 meq via INTRAVENOUS
  Filled 2013-01-19 (×5): qty 50

## 2013-01-19 MED ORDER — INSULIN ASPART 100 UNIT/ML ~~LOC~~ SOLN
0.0000 [IU] | Freq: Three times a day (TID) | SUBCUTANEOUS | Status: DC
  Administered 2013-01-19: 1 [IU] via SUBCUTANEOUS
  Administered 2013-01-19 – 2013-01-20 (×3): 2 [IU] via SUBCUTANEOUS

## 2013-01-19 MED ORDER — SODIUM CHLORIDE 0.9 % IV SOLN
INTRAVENOUS | Status: DC
  Administered 2013-01-19 (×2): 1000 mL via INTRAVENOUS
  Administered 2013-01-20: 100 mL/h via INTRAVENOUS

## 2013-01-19 MED ORDER — FAT EMULSION 20 % IV EMUL
250.0000 mL | INTRAVENOUS | Status: AC
  Administered 2013-01-19: 250 mL via INTRAVENOUS
  Filled 2013-01-19: qty 250

## 2013-01-19 MED ORDER — POTASSIUM CHLORIDE 10 MEQ/50ML IV SOLN
10.0000 meq | INTRAVENOUS | Status: AC
  Administered 2013-01-19: 10 meq via INTRAVENOUS
  Filled 2013-01-19 (×2): qty 50

## 2013-01-19 MED ORDER — TRACE MINERALS CR-CU-F-FE-I-MN-MO-SE-ZN IV SOLN
INTRAVENOUS | Status: AC
  Administered 2013-01-19: 17:00:00 via INTRAVENOUS
  Filled 2013-01-19: qty 2000

## 2013-01-19 NOTE — Progress Notes (Signed)
ANTIBIOTIC CONSULT NOTE - FOLLOW UP  Pharmacy Consult for Vancomycin Indication: MRSA bacteremia  Allergies  Allergen Reactions  . Percocet (Oxycodone-Acetaminophen)     hallucination    Patient Measurements: Height: 6\' 4"  (193 cm) Weight: 178 lb 2.1 oz (80.8 kg) IBW/kg (Calculated) : 86.8   Vital Signs: Temp: 98.4 F (36.9 C) (02/03 1445) BP: 101/70 mmHg (02/03 1445) Pulse Rate: 110  (02/03 1445) Intake/Output from previous day: 02/02 0701 - 02/03 0700 In: 7550.3 [P.O.:3060; I.V.:1808.3; IV Piggyback:1760; TPN:922] Out: 16109 [Urine:300; Drains:10975; Stool:10] Intake/Output from this shift: Total I/O In: 3970.3 [P.O.:880; I.V.:836.7; IV Piggyback:1375; TPN:878.7] Out: 5185 [Urine:805; Emesis/NG output:150; Drains:905; Other:3325]  Labs:  [facility] 01/19/13 0630 01/19/13 0100 01/18/13 0542  WBC 7.4 7.2 12.1*  HGB 8.6* 8.9* 9.8*  PLT 269 299 344  LABCREA -- -- --  CREATININE 1.47* 1.55* 1.31   Estimated Creatinine Clearance: 64.1 ml/min (by C-G formula based on Cr of 1.47).  Basename 01/19/13 1740  VANCOTROUGH 15.7  VANCOPEAK --  VANCORANDOM --  GENTTROUGH --  GENTPEAK --  GENTRANDOM --  TOBRATROUGH --  TOBRAPEAK --  TOBRARND --  AMIKACINPEAK --  AMIKACINTROU --  AMIKACIN --     Assessment:  57 yo M on 6 wks of vanco for MRSA line bactermia, starting from 1/19, was on 1gm IV q24h at home (VT = 21 on 750mg  IV q12 on 1/22 with similar SCr). Plan is IV Vanco through 02/15/13.  Patient has been on Vancomycin 1gm IV q24h since admission.  Vancomycin trough therapeutic today at 15.7.  Scr increased a little today to 1.47.   Patient is afebrile, WBC wnl.   No other cultures this admission.  Goal of Therapy:  Vancomycin trough level 15-20 mcg/ml  Plan:   Continue Vancomycin 1gm Iv q24h  F/u renal function and adjust as needed.  Geoffry Paradise, PharmD, BCPS Pager: [phone] 6:47 PM Pharmacy #: [phone]

## 2013-01-19 NOTE — Care Management Utilization Note (Signed)
UR complete  Iniya Matzek,RN,BSN [id number] 

## 2013-01-19 NOTE — Progress Notes (Signed)
Patient very lethargic overnight.  At around 2116 patient had a fever of 102.7 (pulse 126).  Tylenol suppository was given and 1 hour after intervention MD notified (temp went down to 100.2 and pulse still elevated at 128).  Lab work was ordered. This am, patient's temperature is low grade and pulse 104.  Patient had an episode of incontinence also over the night but did not want a condom catheter to be put on.  He is appearing to be progressively weaker and somnolent.  Also, spoke with family last night.  Patient's sister wanted for me to pass on that both her and the patient would like for the oncologist to come evaluate patient and were concerned that no "cancer specialist" has seen him this admission.  Please follow up with family regarding this concern.  Thanks, Kenton Kingfisher [place]

## 2013-01-19 NOTE — Progress Notes (Signed)
[facility] PROGRESS NOTE  SYNCERE EBLE WUJ:[id number] DOB: 27-Nov-1956 DOA: 01/17/2013 PCP: [name], MD  Brief narrative: [name] is an 57 y.o. 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 [name] which has left him with multiple drains and fistulas, recently hospitalized 12/19/12-01/10/12 for MRSA bacteremia from line sepsis versus cholangitis (on IV vancomycin through 02/15/2013), on chronic TPN via a PICC line, chronic PEG for gastric decompression, history of small bowel obstruction status post jejunal stent, ileostomy, cholecystitis status post cholecystostomy and chronic indwelling drain. He was sent home with home health nursing care and did fairly well up until 01/17/13,  when he presented to the ER with nausea, vomiting, and left sided upper abdominal pain.  Assessment/Plan: Principal Problem:  *Dehydration secondary to nausea and vomiting   N/V likely from malfunctioning of his G-tube or suction apparatus.   Hooked up to suction in the emergency department with a large amount of gastric fluid drained.   Plain abdominal films done on admission did not show any evidence of obstruction.   Continue to hydrate and replace all fluid loss through various drains. Continue to monitor electrolytes.  N/V resolved. Active Problems: Hyperphosphatemia  Pharmacy to manage by adjusting TNA. Hyperglycemia  Secondary to TNA. Metabolic alkalosis  Secondary to gastric hydrogen loss from chronic G-tube drainage.  Some respiratory compensation noted.  Improved since admission. Hypernatremia  Secondary to dehydration. Urine osmolality 718.  Spoke with Dr. Arta Silence of nephrology who recommends replacing output with D5W and running NS at 100 cc/hr. Hypokalemia  Will give K runs x 6.  Recent MRSA bacteremia / Fever  Continue vancomycin through  02/15/2013. SBO (small bowel obstruction)   Continue G-tube suction. Leukocytosis   Likely reflective of dehydration and volume contraction. Improving with IVF. Peritoneal carcinomatosis   Poor prognosis. Requested a consultation with oncology. Anemia of chronic disease   Hemoglobin stable. No current indication for transfusion. Colo-enteric fistula   Seen by wound ostomy RN 01/18/2013. Acute cholecystitis s/p perc draianage [id number]   Drain intact. Drain changed 12/22/2012. Severe protein-calorie malnutrition   Continue TNA. Elevated lipase   Likely from carcinomatosis. Thrombocytosis   Likely reactive or reflective of dehydration. Resolved with hydration. Elevated LFTs   Likely from liver metastasis and intra-abdominal process. Pleural effusion, left   Likely a malignant effusion. Consider thoracentesis for symptomatic treatment. Hypercalcemia of malignancy   Hydrating. Given a dose of pamidronate 01/17/13 with improvement in calcium level.   Code Status: LCB, no intubation but does want CPR/defibrillation/drugs  Family Communication: Seferino Oscar (wife) [phone] not at bedside, left message on answering machine.  Disposition Plan: Home when stable.  Medical Consultants:  Telephone consultation with Dr. Delano Metz, Nephrology.  Dr. Eli Hose, Oncology.  Other Consultants:  Wound care RN  Anti-infectives:  Vancomycin x 6 weeks (to finish 02/15/13)  HPI/Subjective: [name] was noted to be very lethargic overnight per nursing staff. He spiked a fever of 102.7 at 9:16 PM. He was incontinent of urine. The patient has not had any nausea or vomiting overnight. Denies pain.  Objective: Filed Vitals:   01/18/13 1551 01/18/13 2120 01/18/13 2250 01/19/13 0538  BP: 114/77 125/82  104/64  Pulse: 103 108 128 104  Temp: 98.6 F (37 C) 102.1 F (38.9 C) 100.2 F (37.9 C) 99.5 F (37.5 C)  TempSrc: Oral Oral Oral Oral  Resp: 20 20  20   Height:  Weight:      SpO2: 96% 97%  100%    Intake/Output Summary (Last 24 hours) at 01/19/13 0725 Last data filed at 01/19/13 0330  Gross per 24 hour  Intake 4584.33 ml  Output  09811 ml  Net -11775.67 ml    Exam: Gen:  NAD Cardiovascular:  Heart sounds irregular with premature beats noted Respiratory:  Lungs CTAB Gastrointestinal:  Abdomen soft, NT, G tube, ileostomy, ostomy draining fistula and cholecystostomy tubes intact.   Extremities:  No C/E/C  Data Reviewed: Basic Metabolic Panel:  Lab 01/19/13 9147 01/19/13 0100 01/18/13 0542 01/17/13 1055  NA 136 138 147* 143  K 3.1* 2.9* -- --  CL 92* 92* 96 90*  CO2 36* 36* 43* 44*  GLUCOSE 129* 256* 142* 127*  BUN 69* 74* 82* 87*  CREATININE 1.47* 1.55* 1.31 1.02  CALCIUM 10.0 10.1 11.2* 13.4*  MG 1.6 -- 1.9 --  PHOS 2.5 -- 5.4* --   GFR Estimated Creatinine Clearance: 64.1 ml/min (by C-G formula based on Cr of 1.47). Liver Function Tests:  Lab 01/19/13 0630 01/19/13 0100 01/18/13 0542 01/17/13 1055  AST 31 31 40* 55*  ALT 54* 57* 74* 89*  ALKPHOS 247* 259* 326* 355*  BILITOT 1.5* 1.2 1.3* 1.0  PROT 6.3 6.5 7.2 8.5*  ALBUMIN 2.2* 2.3* 2.5* 2.9*    Lab 01/17/13 1055  LIPASE 81*  AMYLASE --   CBC:  Lab 01/19/13 0630 01/19/13 0100 01/18/13 0542 01/17/13 1055  WBC 7.4 7.2 12.1* 17.7*  NEUTROABS 5.4 5.9 9.4* 12.9*  HGB 8.6* 8.9* 9.8* 10.2*  HCT 26.5* 27.9* 31.9* 32.0*  MCV 86.0 87.5 89.4 88.2  PLT 269 299 344 459*   Microbiology Recent Results (from the past 240 hour(s))  MRSA PCR SCREENING     Status: Normal   Collection Time   01/17/13  6:05 PM      Component Value Range Status Comment   MRSA by PCR NEGATIVE  NEGATIVE Final      Procedures and Diagnostic Studies:  Dg Abd Acute W/chest  01/17/2013  *RADIOLOGY REPORT*  Clinical Data: Nausea, vomiting, peritoneal carcinoma.  ACUTE ABDOMEN SERIES (ABDOMEN 2 VIEW & CHEST 1 VIEW)  Comparison: 12/25/2012 and earlier studies  Findings: Right arm PICC extends to the  proximal right atrium. Interval increase in left pleural effusion with increasing atelectasis/consolidation in the left lower lung.  Right lung clear.  Heart size upper limits normal.  Gastrostomy tube projects in expected location.  No free air. Pigtail drain in the right mid abdomen stable.  Enteric stent in the left mid abdomen stable.  Surgical drain projects laterally in the left abdomen as before.  There is a relative paucity of bowel gas.  No dilated loops of bowel are evident.  No abnormal abdominal calcifications.  Anastomotic staple line in the right mid abdomen.  IMPRESSION:  1.  Interval increase in the moderate left pleural effusion with adjacent consolidation / atelectasis. 2.  Nonobstructive bowel gas pattern. 3.  No free air. 4.  Postop changes as above.   Original Report Authenticated By: D. Andria Rhein, MD    Scheduled Meds:    . antiseptic oral rinse  15 mL Mouth Rinse q12n4p  . chlorhexidine  15 mL Mouth/Throat BID  . dextrose  500 mL Intravenous Q6H  . enoxaparin (LOVENOX) injection  40 mg Subcutaneous Q24H  . insulin aspart  0-9 Units Subcutaneous Q4H  . pantoprazole  40 mg Intravenous Q12H  . pregabalin  25 mg Oral  BID  . vancomycin  1,000 mg Intravenous Q24H   Continuous Infusions:    . sodium chloride 1,000 mL (01/18/13 1820)  . TPN (CLINIMIX) +/- additives 80 mL/hr at 01/18/13 1745    Time spent: 35 minutes.   LOS: 2 days   Verlene Glantz  [facility] Pager [phone].  If 8PM-8AM, please contact night-coverage at [email or web address], password [facility] 01/19/2013, 7:25 AM

## 2013-01-19 NOTE — Progress Notes (Signed)
Room 1333 - [name] - [facility] RN Visit-R.Eamonn Sermeno RN  Related admission to [facility] diagnosis of metastatic cancer. Admitted to [facility] hospice home care services 01/09/2013.  Pt is LIMITED CODE - DNI ONLY.    Pt alert & oriented, lying upright in bed, with complaints of feeling weak - denies pain.  Pt states he wants to get better - doesn;t understand why he is feeling so weak since yesterday.  No family present. Patient's home medication list is on shadow chart.   Please call [facility] @ [phone]- ask for RN Liaison or after hours,ask for on-call RN with any hospice needs.    Thank you.  [name], RN  [facility]  Hospice Liaison

## 2013-01-19 NOTE — Progress Notes (Signed)
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 Percocet (Oxycodone-Acetaminophen)     hallucination   Patient Measurements: Height: 6\' 4"  (193 cm) Weight: 178 lb 2.1 oz (80.8 kg) IBW/kg (Calculated) : 86.8   Vital Signs: Temp: 99.5 F (37.5 C) (02/03 0538) Temp src: Oral (02/03 0538) BP: 104/64 mmHg (02/03 0538) Pulse Rate: 104  (02/03 0538) Intake/Output from previous day: 02/02 0701 - 02/03 0700 In: 4584.3 [P.O.:3060; I.V.:1067.3; IV Piggyback:250; TPN:207] Out: 16109 [Urine:200; Drains:10675; Stool:10]  Labs:  [facility] 01/19/13 0630 01/19/13 0100 01/18/13 0542  WBC 7.4 7.2 12.1*  HGB 8.6* 8.9* 9.8*  HCT 26.5* 27.9* 31.9*  PLT 269 299 344  APTT -- -- --  INR -- -- --     Basename 01/19/13 0630 01/19/13 0100 01/18/13 0542  NA 136 138 147*  K 3.1* 2.9* 3.3*  CL 92* 92* 96  CO2 36* 36* 43*  GLUCOSE 129* 256* 142*  BUN 69* 74* 82*  CREATININE 1.47* 1.55* 1.31  LABCREA -- -- --  CREAT24HRUR -- -- --  CALCIUM 10.0 10.1 11.2*  MG 1.6 -- 1.9  PHOS 2.5 -- 5.4*  PROT 6.3 6.5 7.2  ALBUMIN 2.2* 2.3* 2.5*  AST 31 31 40*  ALT 54* 57* 74*  ALKPHOS 247* 259* 326*  BILITOT 1.5* 1.2 1.3*  BILIDIR -- -- --  IBILI -- -- --  PREALBUMIN -- -- --  TRIG -- -- 197*  CHOLHDL -- -- --  CHOL -- -- 152   Estimated Creatinine Clearance: 64.1 ml/min (by C-G formula based on Cr of 1.47).   Insulin Requirements in the past 24 hours:  2 units  Nutritional Goals:   RD recs (from previous admission on 01/01/13):  Kcal: 2600-2800, Protein: 130-140 gm, Fluid: >2.5L/day.    Home TNA was providing: 140g/d protein, average of 2375 Kcals/d/week,  and 3062mL/d(125 ml/hr over 24 hrs).  Inpatient Clinimix E 5/20 at 147ml/hr + Lipids 16ml/hr on MWF to provide: 138g/d protein, average 2634 Kcal/d (2909 kcal MWF, 2429 Kcal TTSS)  Current Nutrition:   CLD  Clinimix 5/15  at 1ml/hr  IVF:   NS at 120ml/hr  D5W bolus q6h  Assessment: 42 YOM with complex history including terminal peritoneal carcinomatosis s/p chemo.  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  Pt with LLQ drain, LUQ drain, ileostomy, choly drain, and NG tube.  Previously, patient was transferred to [facility] on 12/19/12 from [facility] for SBO management, N/V, high NG output.  Patient was ultimately discharged on 01/09/13 on TNA (as well as IV Vanco for MRSA line infection). Readmitted 01/17/13 with dehydration due to N/V.  Electrolytes - Low K, giving IV today. Elevated Na, improved. Elevated CO2, improving. Elevated Phos, improved. Elevated Ca, improving(pamidronate on 2/1).  LFTs - Elevated alk phos and ALT improving.  TGs - 197  Prealbumin - in process  SCr - Elevated but a little better.  Plan:   Change to Clinimix E 5/20 today. Keep rate at 39ml/hr for now.  Fat emulsion at 22ml/hr(MWF only due to ongoing shortage).  TNA to contain standard multivitamins and trace elements(MWF only due to ongoing shortage).  Continue NS at 154ml/hr.  Continue Sensitive SSI, change to AC&HS.   Check Cmet, Mag, and Phos tomorrow.  TNA lab panels on Mondays & Thursdays.  F/u daily.  Charolotte Eke, PharmD, pager [phone]. 01/19/2013,8:34 AM.

## 2013-01-19 NOTE — Progress Notes (Addendum)
INITIAL NUTRITION ASSESSMENT  DOCUMENTATION CODES Per approved criteria  -Not Applicable   INTERVENTION: - TPN per pharmacy - Recommend re-weigh pt and daily weights - Will continue to monitor   NUTRITION DIAGNOSIS: Altered GI function related to small bowel obstruction as evidenced by need for nutrition support to meet nutritional needs.   Goal: 1. TPN to meet >90% of estimated needs 2. Resolution of nausea/vomiting  Monitor:  Weights, labs, diet tolerance/advancement, TPN, intake/output  Reason for Assessment: Consult, TPN  57 y.o. male  Admitting Dx: Dehydration  ASSESSMENT: Pt known to RD from previous admission last month as a transfer from [facility] for management of small bowel obstruction. Palliative care following during previous admission and pt was discharged home with hospice. Pt with peritoneal CA s/p chemotherapy, ileostomy, and gastrostomy for decompression. Pt with complex medical history and extensive treatment and surgeries including debulking procedures and abdominal surgeries. Pt with JP drain, ileostomy, biliary tube drain, and G tube for decompression. Pt has been on TPN since September 2013. Pt weighed 204 pounds on 12/21/12, now down to 178 pounds. Pt with dehydration and had 16.7L total output yesterday from multiple drains, only 2.28L so far today. Nephrology recommended replacing output with D5W and running NS at 118ml/hr.   Patient is receiving TPN with Clinimix E 5/15 @ 80 ml/hr.  Lipids (20% IVFE @ 10 ml/hr), multivitamins, and trace elements are provided 3 times weekly (MWF) due to national backorder.  Provides 1569 kcal and 96 grams protein daily (based on weekly average).  Meets 60% minimum estimated kcal and 74% minimum estimated protein needs.  Additional IVF with NS @ 100 ml/hr.  Goal TPN rate during previous admission was Clinimix E 5/15 @ 115 ml/hr. Lipids (20% IVFE @ 10 ml/hr), multivitamins, and trace elements are provided 3  times weekly (MWF) due to national backorder. Provides 2166 kcal and 138 grams protein daily (based on weekly average). Meets 83% minimum estimated kcal and 100% minimum estimated protein needs.   Pt admitted with LUQ abdominal pain for 2 days in addition to nausea and vomiting. Pt reports PTA he was on a regular diet at home eating 3 meals/day. Pt currently on clear liquid diet however he is mostly consuming juices as he gets nauseated from broth. Pt reports he had some broth at [facility] previously that caused him to be nauseated and vomit for 25 straight days. Pt reports his abdominal pain has resolved but states he gets nauseated and feels like vomiting when his G tube suction is turned off or is not strong enough.    Height: Ht Readings from Last 1 Encounters:  01/17/13 6\' 4"  (1.93 m)    Weight: Wt Readings from Last 1 Encounters:  01/17/13 178 lb 2.1 oz (80.8 kg)    Ideal Body Weight: 202 lb   % Ideal Body Weight: 88  Wt Readings from Last 10 Encounters:  01/17/13 178 lb 2.1 oz (80.8 kg)  12/30/12 204 lb (92.534 kg)  12/30/12 204 lb (92.534 kg)  12/11/12 204 lb (92.534 kg)  12/11/12 204 lb (92.534 kg)  12/11/12 204 lb (92.534 kg)  12/11/12 204 lb (92.534 kg)  12/11/12 204 lb (92.534 kg)  12/06/11 246 lb 4.8 oz (111.721 kg)  11/30/11 248 lb (112.492 kg)    Usual Body Weight: 248 lb 1 year ago  % Usual Body Weight: 72  BMI:  Body mass index is 21.68 kg/(m^2).  Estimated Nutritional Needs: Kcal: 2600-2800 Protein: 130-140g Fluid: >2.6L/day  Skin:  Diet Order: Clear Liquid  EDUCATION NEEDS: -No education needs identified at this time   Intake/Output Summary (Last 24 hours) at 01/19/13 0830 Last data filed at 01/19/13 0800  Gross per 24 hour  Intake 6900.33 ml  Output  09604 ml  Net -8199.67 ml    Last BM: Ileostomy output total of 10ml yesterday  Labs:   Lab 01/19/13 0630 01/19/13 0100 01/18/13 0542  NA 136 138 147*  K 3.1* 2.9* 3.3*  CL  92* 92* 96  CO2 36* 36* 43*  BUN 69* 74* 82*  CREATININE 1.47* 1.55* 1.31  CALCIUM 10.0 10.1 11.2*  MG 1.6 -- 1.9  PHOS 2.5 -- 5.4*  GLUCOSE 129* 256* 142*    CBG (last 3)   Basename 01/19/13 0820 01/19/13 0437 01/19/13 0034  GLUCAP 164* 106* 137*    Scheduled Meds:   . antiseptic oral rinse  15 mL Mouth Rinse q12n4p  . chlorhexidine  15 mL Mouth/Throat BID  . dextrose  500 mL Intravenous Q6H  . enoxaparin (LOVENOX) injection  40 mg Subcutaneous Q24H  . insulin aspart  0-9 Units Subcutaneous Q4H  . pantoprazole  40 mg Intravenous Q12H  . potassium chloride  10 mEq Intravenous Q1 Hr x 5  . pregabalin  25 mg Oral BID  . vancomycin  1,000 mg Intravenous Q24H    Continuous Infusions:   . sodium chloride 1,000 mL (01/18/13 1820)  . TPN (CLINIMIX) +/- additives 80 mL/hr at 01/18/13 1745    Past Medical History  Diagnosis Date  . Hypertension   . GERD (gastroesophageal reflux disease)   . Small bowel obstruction 2013  . 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 Acute cholecystitis s/p perc draianage [id number] 12/20/2012  . Anemia of chronic disease 12/19/2012  . 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 MRSA bacteremia 01/04/2013  . SBO (small bowel obstruction) 11/07/2011  . Septic shock due to Staphylococcus aureus 12/24/2012  . Severe protein-calorie malnutrition 01/17/2013    Past Surgical History  Procedure Date  . Tonsillectomy   . Esophagogastroduodenoscopy 12/11/2012    Procedure: ESOPHAGOGASTRODUODENOSCOPY (EGD);  Surgeon: Louis Meckel, MD;  Location: [facility] ENDOSCOPY;  Service: Endoscopy;  Laterality: N/A;  . Duodenal stent placement 12/11/2012    Procedure: DUODENAL STENT PLACEMENT;  Surgeon: Louis Meckel, MD;  Location: [facility] ENDOSCOPY;  Service: Endoscopy;  Laterality: N/A;  . Esophagogastroduodenoscopy 12/13/2012    Procedure: ESOPHAGOGASTRODUODENOSCOPY (EGD);  Surgeon: Louis Meckel, MD;  Location: [facility] OR;  Service: Endoscopy;  Laterality: N/A;  with attempt to stent duodenal stricture  . Peg placement 12/12/2012    Procedure: PERCUTANEOUS ENDOSCOPIC GASTROSTOMY (PEG) PLACEMENT;  Surgeon: Louis Meckel, MD;  Location: [facility] ENDOSCOPY;  Service: Endoscopy;  Laterality: N/A;  . Small intestine surgery jan 2013    [facility].  SB resection, ileostomy, gastrostomy  . Debulking sep 2013    [facility].  ileal SB resection, debulking of peritoneal carcinomatosis, intraperitoneal chemotherapy  . Colon surgery oct 2013    [facility] transverse colon perforation  . Enteroscopy 12/30/2012    Procedure: ENTEROSCOPY;  Surgeon: Louis Meckel, MD;  Location: [facility] ENDOSCOPY;  Service: Endoscopy;  Laterality: N/A;  With possible stent placement; please have duodenal stent available  . Duodenal stent placement 12/30/2012    Procedure: DUODENAL STENT PLACEMENT;  Surgeon: Louis Meckel, MD;  Location: [facility] ENDOSCOPY;  Service: Endoscopy;  Laterality: N/A;    .  Arabel Barcenas Baron MS, RD, LDN [phone] Pager [phone] After Hours Pager  

## 2013-01-20 DIAGNOSIS — M109 Gout, unspecified: Secondary | ICD-10-CM | POA: Diagnosis present

## 2013-01-20 DIAGNOSIS — N179 Acute kidney failure, unspecified: Secondary | ICD-10-CM | POA: Diagnosis present

## 2013-01-20 LAB — MAGNESIUM: Magnesium: 1.7 mg/dL (ref 1.5–2.5)

## 2013-01-20 LAB — GLUCOSE, CAPILLARY
Glucose-Capillary: 141 mg/dL — ABNORMAL HIGH (ref 70–99)
Glucose-Capillary: 170 mg/dL — ABNORMAL HIGH (ref 70–99)
Glucose-Capillary: 244 mg/dL — ABNORMAL HIGH (ref 70–99)

## 2013-01-20 LAB — COMPREHENSIVE METABOLIC PANEL
AST: 27 U/L (ref 0–37)
Albumin: 2 g/dL — ABNORMAL LOW (ref 3.5–5.2)
Calcium: 9.2 mg/dL (ref 8.4–10.5)
Chloride: 91 mEq/L — ABNORMAL LOW (ref 96–112)
Creatinine, Ser: 1.28 mg/dL (ref 0.50–1.35)
Total Bilirubin: 0.9 mg/dL (ref 0.3–1.2)

## 2013-01-20 LAB — BASIC METABOLIC PANEL
CO2: 28 mEq/L (ref 19–32)
Glucose, Bld: 234 mg/dL — ABNORMAL HIGH (ref 70–99)
Potassium: 3.9 mEq/L (ref 3.5–5.1)
Sodium: 128 mEq/L — ABNORMAL LOW (ref 135–145)

## 2013-01-20 MED ORDER — SODIUM CHLORIDE 0.9 % IV SOLN
INTRAVENOUS | Status: DC
  Administered 2013-01-20 – 2013-01-21 (×2): via INTRAVENOUS
  Filled 2013-01-20 (×5): qty 1000

## 2013-01-20 MED ORDER — INSULIN ASPART 100 UNIT/ML ~~LOC~~ SOLN
0.0000 [IU] | Freq: Three times a day (TID) | SUBCUTANEOUS | Status: DC
  Administered 2013-01-20: 4 [IU] via SUBCUTANEOUS
  Administered 2013-01-20: 7 [IU] via SUBCUTANEOUS

## 2013-01-20 MED ORDER — CLINIMIX E/DEXTROSE (5/20) 5 % IV SOLN
INTRAVENOUS | Status: DC
  Administered 2013-01-20: 19:00:00 via INTRAVENOUS
  Filled 2013-01-20: qty 2000

## 2013-01-20 MED ORDER — METHYLPREDNISOLONE SODIUM SUCC 40 MG IJ SOLR
40.0000 mg | Freq: Every day | INTRAMUSCULAR | Status: DC
  Administered 2013-01-20 – 2013-01-21 (×2): 40 mg via INTRAVENOUS
  Filled 2013-01-20 (×2): qty 1

## 2013-01-20 MED ORDER — POTASSIUM CHLORIDE IN NACL 20-0.9 MEQ/L-% IV SOLN
INTRAVENOUS | Status: DC
  Administered 2013-01-20: 100 mL/h via INTRAVENOUS
  Filled 2013-01-20: qty 1000

## 2013-01-20 MED ORDER — POTASSIUM CHLORIDE 10 MEQ/50ML IV SOLN
10.0000 meq | INTRAVENOUS | Status: AC
  Administered 2013-01-20 (×4): 10 meq via INTRAVENOUS
  Filled 2013-01-20 (×4): qty 50

## 2013-01-20 NOTE — Progress Notes (Addendum)
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 Percocet (Oxycodone-Acetaminophen)     hallucination   Patient Measurements: Height: 6\' 4"  (193 cm) Weight: 186 lb 4.6 oz (84.5 kg) IBW/kg (Calculated) : 86.8   Vital Signs: Temp: 98.7 F (37.1 C) (02/04 0437) Temp src: Oral (02/04 0437) BP: 100/59 mmHg (02/04 0437) Pulse Rate: 95  (02/04 0437) Intake/Output from previous day: 02/03 0701 - 02/04 0700 In: 4470.3 [P.O.:980; I.V.:836.7; IV Piggyback:1575; TPN:878.7] Out: 7311 [Urine:2255; Emesis/NG output:150; Drains:1581]  Labs:  [facility] 01/19/13 0630 01/19/13 0100 01/18/13 0542  WBC 7.4 7.2 12.1*  HGB 8.6* 8.9* 9.8*  HCT 26.5* 27.9* 31.9*  PLT 269 299 344  APTT -- -- --  INR -- -- --     Basename 01/20/13 0500 01/19/13 0630 01/19/13 0100 01/18/13 0542  NA 129* 136 138 --  K 2.5* 3.1* 2.9* --  CL 91* 92* 92* --  CO2 31 36* 36* --  GLUCOSE 157* 129* 256* --  BUN 49* 69* 74* --  CREATININE 1.28 1.47* 1.55* --  LABCREA -- -- -- --  CREAT24HRUR -- -- -- --  CALCIUM 9.2 10.0 10.1 --  MG 1.7 1.6 -- 1.9  PHOS 2.7 2.5 -- 5.4*  PROT 6.3 6.3 6.5 --  ALBUMIN 2.0* 2.2* 2.3* --  AST 27 31 31  --  ALT 43 54* 57* --  ALKPHOS 219* 247* 259* --  BILITOT 0.9 1.5* 1.2 --  BILIDIR -- -- -- --  IBILI -- -- -- --  PREALBUMIN -- -- -- 22.3  TRIG -- -- -- 197*  CHOLHDL -- -- -- --  CHOL -- 122 -- 152   Estimated Creatinine Clearance: 77 ml/min (by C-G formula based on Cr of 1.28).   Insulin Requirements in the past 24 hours:  8 units.  Nutritional Goals:   RD recs (2/3):  Kcal: 2600-2800, Protein: 130-140 gm, Fluid: >2.6L/day.    Home TNA was providing: 140g/d protein, average of 2375 Kcals/d/week,  and 307mL/d(125 ml/hr over 24 hrs).  Inpatient Clinimix E 5/20 at 122ml/hr + Lipids 53ml/hr on MWF to provide: 138g/d protein, average 2634 Kcal/d (2909 kcal MWF,  2429 Kcal TTSS)  Current Nutrition:   CLD - very little intake charted but he says he is tolerating.  Clinimix E 5/20 at 30ml/hr  D5W boluses provided 797Kcal on 2/3  IVF:   NS at 135ml/hr  D5W boluses q6h to replace drain losses - total of on 2/3.  Assessment: 42 YOM with complex history including terminal peritoneal carcinomatosis s/p chemo.  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  Pt with LLQ drain, LUQ drain, ileostomy, choly drain, and NG tube.  Previously, patient was transferred to [facility] on 12/19/12 from [facility] for SBO management, N/V, high NG output.  Patient was ultimately discharged on 01/09/13 on TNA (as well as IV Vanco for MRSA line infection). Readmitted 01/17/13 with dehydration due to N/V.  Glucose - 141-185 (goal <150) on Sensitive SSI ACHS.  Electrolytes - Low K continues, IV this AM. Low Na now, on NS, likely from rehydration but could be partially d/t TNA(approx 1/4NS). Asymptomatic.  Phos and Mag wnl. Corrected Ca ~10.8, just above normal range(got pamidronate on 2/1).  LFTs - Elevated alk phos improving. ALT now wnl.  TGs - 197  Prealbumin - 22.3  SCr - cont to improve.  Plan:  Cont Clinimix E 5/20 at 25ml/hr today(TNA + D5W boluses = 2966Kcal yesterday).  Fat emulsion at 62ml/hr(MWF only due to ongoing shortage).  TNA to contain standard multivitamins and trace elements(MWF only due to ongoing shortage).  KCl added to NS. Cont at 152ml/hr. (will deliver 125meq/24hrs)  Increase to Resistant SSI. Cont CBGs at AC&HS to avoid capturing any post-prandial spikes.   TNA lab panels on Mondays & Thursdays.  Bmet in the AM.   F/u daily.  Charolotte Eke, PharmD, pager [phone]. 01/20/2013,7:58 AM.

## 2013-01-20 NOTE — Progress Notes (Signed)
Room 1333 - [name] - [facility] RN Visit-Alan Faulkenberry RN  Related admission to [facility] diagnosis of malignant neoplasm.  Pt is Limited code.    Pt alert & oriented, lying in bed, with no complaints of pain or discomfort.    No family present.  [facility] Chaplain attempted to visit and pt was asleep- gave pt Chaplain's card - Dr. Darnelle [name] saw pt afterwards and pt is awake and conversational.  Per discussion with Dr. Darnelle [name], pt was offered to transfer to [facility] to see his oncologist Dr. Gwyndolyn [name] - pt declined and then stated he wanted to discuss with his wife and then decide.   Current plan is to discharge home when labs are stable per Dr. Darnelle [name]. Patient's home medication list is on shadow chart.   Please call [facility] @ [phone]- ask for RN Liaison or after hours,ask for on-call RN with any hospice needs.    Thank you.  Alan Boers, RN  [facility]  Hospice Liaison

## 2013-01-20 NOTE — Progress Notes (Signed)
PATIENT'S POTASSIUM THIS AM IS 2.5, NOTIFIED HOUSE COVERAGE, Zayanna Pundt, RN

## 2013-01-20 NOTE — Progress Notes (Signed)
RM 1333  [name]                               [facility] SW Note:  Pt was alert, oriented, engaging. He had a long time friend visiting. Both shared that pt's goal is for any aggressive tx that will help him live. He is attempting to maintain a positive attitude and outlook and prefers those assisting him in his care do the same. He had questions about POA/HCPOA which Sw addressed as he is considering completing a HCPOA. Sw will follow up to check pt's status and support as needed.Althia Forts, LCSW

## 2013-01-20 NOTE — Progress Notes (Signed)
[facility] PROGRESS NOTE  [name] WUJ:[id number] DOB: Feb 22, 1956 DOA: 01/17/2013 PCP: [name], MD Oncologist: Dr. Gwyndolyn Saxon [facility]), [name] has seen him locally.  Brief narrative: [name] is an 57 y.o. 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 [name] which has left him with multiple drains and fistulas, recently hospitalized 12/19/12-01/10/12 for MRSA bacteremia from line sepsis versus cholangitis (on IV vancomycin through 02/15/2013), on chronic TPN via a PICC line, chronic PEG for gastric decompression, history of small bowel obstruction status post jejunal stent, ileostomy, cholecystitis status post cholecystostomy and chronic indwelling drain. He was sent home with home health nursing care and did fairly well up until 01/17/13,  when he presented to the ER with nausea, vomiting, and left sided upper abdominal pain. [name], his consulting oncologist locally, has indicated that there are no other treatment options left. I did offer to call his primary oncologist in [place] to see if she would accept him in transfer, but he indicates he prefers to follow up with her after he gets discharged home as an outpatient. Patient did have a palliative care meeting during his previous hospital stay but continues to want fairly aggressive treatment despite poor prognosis.    Assessment/Plan: Principal Problem:  *Dehydration secondary to nausea and vomiting   N/V likely from malfunctioning of his G-tube or suction apparatus.   Hooked up to suction in the emergency department with a large amount of gastric fluid drained.   Plain abdominal films done on admission did not show any evidence of obstruction.   Continue to hydrate and replace all fluid loss through various drains (nursing staff to calculate output every 6 hours and replace what is  lost with D5W). Continue to monitor electrolytes.  N/V response to G-tube at high suction. Active Problems: Gout  Patient is complaining of lower extremity pain consistent with his usual gout flares. We'll start him on Solu-Medrol for 3 days. Acute kidney injury secondary to dehydration  Creatinine improving with IV fluids. Hyperphosphatemia  Corrected. Hyperglycemia  Secondary to TNA. Metabolic alkalosis  Secondary to gastric hydrogen loss from chronic G-tube drainage.  Some respiratory compensation noted.  Corrected. Hypernatremia  Secondary to dehydration. Urine osmolality 718.  Spoke with Dr. Arta Silence of nephrology who recommends replacing output with D5W and running NS at 100 cc/hr. Hypokalemia  Add 60 mEq per liter to IV fluids and give potassium runs as needed.  Recent MRSA bacteremia / Fever  Continue vancomycin through 02/15/2013. SBO (small bowel obstruction)   Continue G-tube suction. Leukocytosis   Likely reflective of dehydration and volume contraction. Improving with IVF. Peritoneal carcinomatosis   Poor prognosis. Requested a consultation with oncology.  [name] out of town, to return 01/21/13. Anemia of chronic disease   Hemoglobin stable. No current indication for transfusion. Colo-enteric fistula   Seen by wound ostomy RN 01/18/2013. Acute cholecystitis s/p perc draianage [id number]   Drain intact. Drain changed 12/22/2012. Severe protein-calorie malnutrition   Continue TNA. Elevated lipase   Likely from carcinomatosis. Thrombocytosis   Likely reactive or reflective of dehydration. Resolved with hydration. Elevated LFTs   Likely from liver metastasis and intra-abdominal process. Pleural effusion, left   Likely a malignant effusion. Consider thoracentesis for symptomatic treatment. Hypercalcemia of malignancy   Hydrating. Given a dose of pamidronate 01/17/13 with improvement in calcium level.   Code Status: LCB, no intubation but does want  CPR/defibrillation/drugs  Family Communication: [name] (wife) [phone] not at bedside, left message on answering machine. Brother updated at bedside 01/19/2013. Disposition Plan: Home when stable.  Medical Consultants:  Telephone consultation with Dr. Delano Metz, Nephrology.  Dr. Eli Hose, Oncology.  Other Consultants:  Wound care RN  Anti-infectives:  Vancomycin x 6 weeks (to finish 02/15/13)  HPI/Subjective: [name] says that his nausea and vomiting have resolved with turning the G-tube suction up to the highest level. He is now complaining of gout-type pain in his legs and feet. Otherwise no other pain.  Objective: Filed Vitals:   01/19/13 1846 01/19/13 2109 01/19/13 2200 01/20/13 0437  BP:  87/59 93/63 100/59  Pulse:  98 98 95  Temp:  98.9 F (37.2 C)  98.7 F (37.1 C)  TempSrc:  Oral  Oral  Resp:  16  18  Height:      Weight: 84.5 kg (186 lb 4.6 oz)     SpO2:  100% 100% 100%    Intake/Output Summary (Last 24 hours) at 01/20/13 0737 Last data filed at 01/20/13 0981  Gross per 24 hour  Intake 4470.33 ml  Output   7411 ml  Net -2940.67 ml    Exam: Gen:  NAD Cardiovascular:  Heart sounds irregular with premature beats noted Respiratory:  Lungs CTAB Gastrointestinal:  Abdomen soft, NT, G tube, ileostomy, ostomy draining fistula and cholecystostomy tubes intact.   Extremities:  No C/E/C  Data Reviewed: Basic Metabolic Panel:  Lab 01/20/13 1914 01/19/13 0630 01/19/13 0100 01/18/13 0542 01/17/13 1055  NA 129* 136 138 147* 143  K 2.5* 3.1* -- -- --  CL 91* 92* 92* 96 90*  CO2 31 36* 36* 43* 44*  GLUCOSE 157* 129* 256* 142* 127*  BUN 49* 69* 74* 82* 87*  CREATININE 1.28 1.47* 1.55* 1.31 1.02  CALCIUM 9.2 10.0 10.1 11.2* 13.4*  MG 1.7 1.6 -- 1.9 --  PHOS 2.7 2.5 -- 5.4* --   GFR Estimated Creatinine Clearance: 77 ml/min (by C-G formula based on Cr of 1.28). Liver Function Tests:  Lab 01/20/13 0500 01/19/13 0630 01/19/13 0100 01/18/13  0542 01/17/13 1055  AST 27 31 31  40* 55*  ALT 43 54* 57* 74* 89*  ALKPHOS 219* 247* 259* 326* 355*  BILITOT 0.9 1.5* 1.2 1.3* 1.0  PROT 6.3 6.3 6.5 7.2 8.5*  ALBUMIN 2.0* 2.2* 2.3* 2.5* 2.9*    Lab 01/17/13 1055  LIPASE 81*  AMYLASE --   CBC:  Lab 01/19/13 0630 01/19/13 0100 01/18/13 0542 01/17/13 1055  WBC 7.4 7.2 12.1* 17.7*  NEUTROABS 5.4 5.9 9.4* 12.9*  HGB 8.6* 8.9* 9.8* 10.2*  HCT 26.5* 27.9* 31.9* 32.0*  MCV 86.0 87.5 89.4 88.2  PLT 269 299 344 459*   Microbiology Recent Results (from the past 240 hour(s))  MRSA PCR SCREENING     Status: Normal   Collection Time   01/17/13  6:05 PM      Component Value Range Status Comment   MRSA by PCR NEGATIVE  NEGATIVE Final      Procedures and Diagnostic Studies:  Dg Abd Acute W/chest  01/17/2013  *RADIOLOGY REPORT*  Clinical Data: Nausea, vomiting, peritoneal carcinoma.  ACUTE ABDOMEN SERIES (ABDOMEN 2 VIEW & CHEST 1 VIEW)  Comparison: 12/25/2012 and earlier studies  Findings: Right arm PICC extends to the proximal right atrium. Interval increase in left pleural effusion with increasing atelectasis/consolidation in the left lower lung.  Right lung clear.  Heart size upper limits normal.  Gastrostomy tube projects in  expected location.  No free air. Pigtail drain in the right mid abdomen stable.  Enteric stent in the left mid abdomen stable.  Surgical drain projects laterally in the left abdomen as before.  There is a relative paucity of bowel gas.  No dilated loops of bowel are evident.  No abnormal abdominal calcifications.  Anastomotic staple line in the right mid abdomen.  IMPRESSION:  1.  Interval increase in the moderate left pleural effusion with adjacent consolidation / atelectasis. 2.  Nonobstructive bowel gas pattern. 3.  No free air. 4.  Postop changes as above.   Original Report Authenticated By: D. Andria Rhein, MD    Scheduled Meds:    . antiseptic oral rinse  15 mL Mouth Rinse q12n4p  . chlorhexidine  15 mL Mouth/Throat  BID  . dextrose  500 mL Intravenous Q6H  . enoxaparin (LOVENOX) injection  40 mg Subcutaneous Q24H  . insulin aspart  0-9 Units Subcutaneous TID AC & HS  . pantoprazole  40 mg Intravenous Q12H  . potassium chloride  10 mEq Intravenous Q1 Hr x 4  . pregabalin  25 mg Oral BID  . vancomycin  1,000 mg Intravenous Q24H   Continuous Infusions:    . 0.9 % NaCl with KCl 20 mEq / L 100 mL/hr (01/20/13 0701)  . TPN (CLINIMIX) +/- additives 80 mL/hr at 01/19/13 1722   And  . fat emulsion 250 mL (01/19/13 1721)    Time spent: 35 minutes.   LOS: 3 days   RAMA,CHRISTINA  [facility] Pager [phone].  If 8PM-8AM, please contact night-coverage at [email or web address], password [facility] 01/20/2013, 7:37 AM

## 2013-01-21 DIAGNOSIS — D638 Anemia in other chronic diseases classified elsewhere: Secondary | ICD-10-CM

## 2013-01-21 DIAGNOSIS — C762 Malignant neoplasm of abdomen: Secondary | ICD-10-CM

## 2013-01-21 DIAGNOSIS — K632 Fistula of intestine: Secondary | ICD-10-CM

## 2013-01-21 LAB — BASIC METABOLIC PANEL
CO2: 26 mEq/L (ref 19–32)
Chloride: 97 mEq/L (ref 96–112)
GFR calc Af Amer: >90 mL/min (ref >90)
Potassium: 4.4 mEq/L (ref 3.5–5.1)

## 2013-01-21 MED ORDER — TRACE MINERALS CR-CU-F-FE-I-MN-MO-SE-ZN IV SOLN
INTRAVENOUS | Status: DC
  Filled 2013-01-21: qty 2000

## 2013-01-21 MED ORDER — FAT EMULSION 20 % IV EMUL
250.0000 mL | INTRAVENOUS | Status: DC
  Filled 2013-01-21: qty 250

## 2013-01-21 MED ORDER — HEPARIN SOD (PORK) LOCK FLUSH 100 UNIT/ML IV SOLN
INTRAVENOUS | Status: AC
  Administered 2013-01-21: 500 [IU]
  Filled 2013-01-21: qty 10

## 2013-01-21 MED ORDER — METHYLPREDNISOLONE SODIUM SUCC 40 MG IJ SOLR: 40.0000 mg | Freq: Every day | INTRAMUSCULAR | Status: DC

## 2013-01-21 MED ORDER — BIOTENE DRY MOUTH MT LIQD: 15.0000 mL | Freq: Two times a day (BID) | OROMUCOSAL | Status: DC

## 2013-01-21 MED ORDER — ONDANSETRON HCL 4 MG/2ML IJ SOLN: 4.0000 mg | Freq: Four times a day (QID) | INTRAMUSCULAR | Status: DC | PRN

## 2013-01-21 MED ORDER — POTASSIUM CHLORIDE IN NACL 40-0.9 MEQ/L-% IV SOLN
INTRAVENOUS | Status: DC
  Administered 2013-01-21: 10:00:00 via INTRAVENOUS
  Filled 2013-01-21 (×2): qty 1000

## 2013-01-21 MED ORDER — MORPHINE SULFATE 4 MG/ML IJ SOLN: 4.0000 mg | INTRAMUSCULAR | Status: DC | PRN

## 2013-01-21 MED ORDER — INSULIN ASPART 100 UNIT/ML ~~LOC~~ SOLN
0.0000 [IU] | Freq: Three times a day (TID) | SUBCUTANEOUS | Status: DC
  Administered 2013-01-21: 4 [IU] via SUBCUTANEOUS

## 2013-01-21 MED ORDER — INSULIN GLARGINE 100 UNIT/ML ~~LOC~~ SOLN
10.0000 [IU] | Freq: Every day | SUBCUTANEOUS | Status: DC
  Administered 2013-01-21: 10 [IU] via SUBCUTANEOUS

## 2013-01-21 MED ORDER — DICLOFENAC SODIUM 1 % TD GEL: 2.0000 g | Freq: Two times a day (BID) | TRANSDERMAL | Status: DC | PRN

## 2013-01-21 MED ORDER — VANCOMYCIN HCL IN DEXTROSE 1-5 GM/200ML-% IV SOLN: 1000.0000 mg | INTRAVENOUS | Status: DC

## 2013-01-21 MED ORDER — CHLORHEXIDINE GLUCONATE 0.12 % MT SOLN: 15.0000 mL | Freq: Two times a day (BID) | OROMUCOSAL | Status: DC

## 2013-01-21 NOTE — Progress Notes (Signed)
Pt plan to dc to acute to acute,  [facility] in [place], [place] for continued care. Dr. Ricke Hey [phone].

## 2013-01-21 NOTE — Progress Notes (Signed)
[facility] notified re: patients transfer.Hulda Marin RN

## 2013-01-21 NOTE — Progress Notes (Signed)
[facility] Hulda Marin RN

## 2013-01-21 NOTE — Progress Notes (Signed)
Patient's sister requested visit from our spiritual care volunteer, Dedra Skeens. She visited with the patient today. He talked about leaving the hospital. He appreciated the visit and requested prayer. Velna Hatchet prayed with him and family. Listening; presence; support.

## 2013-01-21 NOTE — Progress Notes (Signed)
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 Percocet (Oxycodone-Acetaminophen)     hallucination   Patient Measurements: Height: 6\' 4"  (193 cm) Weight: 186 lb 4.6 oz (84.5 kg) IBW/kg (Calculated) : 86.8   Vital Signs: Temp: 98 F (36.7 C) (02/05 0530) Temp src: Oral (02/05 0530) BP: 108/72 mmHg (02/05 0530) Pulse Rate: 66  (02/05 0530) Intake/Output from previous day: 02/04 0701 - 02/05 0700 In: 2980 [P.O.:360; I.V.:620; IV Piggyback:2000] Out: 4256 [Urine:2800; Drains:1456]  Labs:  Basename 01/19/13 0630 01/19/13 0100  WBC 7.4 7.2  HGB 8.6* 8.9*  HCT 26.5* 27.9*  PLT 269 299  APTT -- --  INR -- --     Basename 01/21/13 0500 01/20/13 1650 01/20/13 0500 01/19/13 0630 01/19/13 0100  NA 131* 128* 129* -- --  K 4.4 3.9 2.5* -- --  CL 97 92* 91* -- --  CO2 26 28 31  -- --  GLUCOSE 211* 234* 157* -- --  BUN 33* 37* 49* -- --  CREATININE 1.04 1.07 1.28 -- --  LABCREA -- -- -- -- --  CREAT24HRUR -- -- -- -- --  CALCIUM 9.1 9.1 9.2 -- --  MG -- -- 1.7 1.6 --  PHOS -- -- 2.7 2.5 --  PROT -- -- 6.3 6.3 6.5  ALBUMIN -- -- 2.0* 2.2* 2.3*  AST -- -- 27 31 31   ALT -- -- 43 54* 57*  ALKPHOS -- -- 219* 247* 259*  BILITOT -- -- 0.9 1.5* 1.2  BILIDIR -- -- -- -- --  IBILI -- -- -- -- --  PREALBUMIN -- -- -- -- --  TRIG -- -- -- -- --  CHOLHDL -- -- -- -- --  CHOL -- -- -- 122 --   Estimated Creatinine Clearance: 94.8 ml/min (by C-G formula based on Cr of 1.04).   Insulin Requirements in the past 24 hours:  13 units.  Nutritional Goals:   RD recs(2/3):  Kcal: 2600-2800, Protein: 130-140 gm, Fluid: >2.6L/day.    Home TNA was providing: 140g/d protein, average of 2375 Kcals/d/week,  and 3019mL/d(125 ml/hr over 24 hrs).  Inpatient Clinimix E 5/20 at 158ml/hr + Lipids 51ml/hr on MWF to provide: 138g/d protein, average 2634 Kcal/d (2909 kcal MWF, 2429  Kcal TTSS)  Current Nutrition:   CLD - none charted but he says he is tolerating small amounts.  Clinimix E 5/20 at 80ml/hr  D5W boluses provided 306Kcal on 2/4.  Total Kcal on 2/4 ~ 1900.  IVF:   NS w/ 62meq/l KCl at 14ml/hr  D5W boluses q6h to replace drain losses - total of on 2/4. Correction: total D5W on 2/3 was .  Assessment: 67 YOM with complex history including terminal peritoneal carcinomatosis s/p chemo.  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  Pt with LLQ drain, LUQ drain, ileostomy, choly drain, and NG tube.  Previously, patient was transferred to [facility] on 12/19/12 from [facility] for SBO management, N/V, high NG output.  Patient was ultimately discharged on 01/09/13 on TNA (as well as IV Vanco for MRSA line infection). Readmitted 01/17/13 with dehydration due to N/V.  Glucose - Max 260 yest, overnight range 170-192 (goal <150) on Resistant SSI ACHS. Solumedrol ordered for 2/4-2/7.  Electrolytes - K improved. IVF is delivering 155meq/24hrs. Na improved. Corrected Ca ~10.8, just above normal range(got pamidronate on 2/1).  LFTs - 2/4: Alk phos improving. ALT  now wnl.  TGs - 197(2/2)  Prealbumin - 22.3(2/2)  SCr - wnl now.  Plan:   Cont Clinimix E 5/20 at 6ml/hr today. Consider advancing to 130ml/hr tomorrow if glucose better controlled and lytes look good.   Fat emulsion at 49ml/hr(MWF only due to ongoing shortage).  TNA to contain standard multivitamins and trace elements(MWF only due to ongoing shortage).  IVF: decrease KCl to 74meq/l(will deliver 35meq/24hr).  Cont Resistant SSI at AC&HS.   Add Lantus 10units q24h while receiving Solumedrol.  TNA lab panels on Mondays & Thursdays.  F/u daily.  Charolotte Eke, PharmD, pager [phone]. 01/21/2013,7:45 AM.

## 2013-01-21 NOTE — Progress Notes (Signed)
D/C instructions rendered to patient, verbalized understanding. Hulda Marin RN

## 2013-01-21 NOTE — Progress Notes (Signed)
Room 1333 - [name] - [facility] RN Visit-R.Zamira Hickam RN  Related admission to [facility] diagnosis of metastatic malignant neoplasm. Pt is Limited code for DNI only.   Pt alert & oriented, sitting up in bed, without complaints of pain or discomfort.    No family present. Hospital Chaplain present and states she is friends with patient's sister who lives across the street from her.    Pt states his brother has made arrangements for him to be seen by oncologist at [facility] in [place], [place] for "new technology to get rid of this tumor."  Pt states he is supposed to transfer to [facility] today and is aware he must revoke [facility] services and can reinstate [facility] services when he is discharged back home to [facility].  Advised pt we would have the [facility] SW come to get the appropriate papers signed for revocation.    Discussed above with CM RN Cookie, who did not know anything about above transfer - no notes in chart as to this either.  CM RN will investigate.  Phone call with  [facility] SW Exie Parody to advise of above - she will follow up with patient and family.   Patient's home medication list is on shadow chart.   Please call [facility] @ [phone]- ask for RN Liaison or after hours,ask for on-call RN with any hospice needs.    Thank you.  [name], RN  [facility]  Hospice Liaison

## 2013-01-21 NOTE — Discharge Summary (Signed)
Physician Discharge Summary  [name] GNF:[id number] DOB: 08/14/1956 DOA: 01/17/2013  PCP: [name], MD  Admit date: 01/17/2013 Discharge date: 01/21/2013  Recommendations for Follow-up:  1. Continue vancomycin through 02/15/2013 2. Continue Solu-Medrol for a gout flare until and including 01/22/2013  Discharge Diagnoses:  Principal Problem:  *Dehydration secondary to nausea and vomiting Active Problems:  SBO (small bowel obstruction)  Leukocytosis  Hypokalemia  Peritoneal carcinomatosis  Nausea and vomiting in adult  Anemia of chronic disease  Colo-enteric fistula  Acute cholecystitis s/p perc draianage [id number]  MRSA bacteremia  Severe protein-calorie malnutrition  Elevated lipase  Thrombocytosis  Elevated LFTs  Pleural effusion, left  Hypercalcemia of malignancy  Hypernatremia  Metabolic alkalosis  Hyperglycemia  Hyperphosphatemia  Acute kidney injury secondary to dehydration  Gout    Discharge Condition: Medically stable for transfer today to [facility]  Diet recommendation: continue TNA  History of present illness:  [name] is an 57 y.o. 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 [name] which has left him with multiple drains and fistulas, recently hospitalized 12/19/12-01/10/12 for MRSA bacteremia from line sepsis versus cholangitis (on IV vancomycin through 02/15/2013), on chronic TPN via a PICC line, chronic PEG for gastric decompression, history of small bowel obstruction status post jejunal stent, ileostomy, cholecystitis status post cholecystostomy and chronic indwelling drain. He was sent home with home health nursing care and did fairly well up until 01/17/13, when he presented to the ER with nausea, vomiting, and left sided upper abdominal pain. [name], his consulting oncologist locally, has  indicated that there are no other treatment options left. Patient did have a palliative care meeting during his previous hospital stay but continues to want fairly aggressive treatment despite poor prognosis.   Assessment/Plan:   Principal Problem:  *Dehydration secondary to nausea and vomiting  N/V likely from malfunctioning of his G-tube or suction apparatus.  Connected to suction in the emergency department with a large amount of gastric fluid drained.  Plain abdominal films done on admission did not show any evidence of obstruction.  Continue to hydrate and replace all fluid loss through various drains (nursing staff to calculate output every 6 hours and replace what is lost with D5W). Continue to monitor electrolytes. N/V response to G-tube at high suction. Active Problems:  Gout  Patient is complaining of lower extremity pain consistent with his usual gout flares.  Started Solu-Medrol for 3 days. Started 01/20/2013. Acute kidney injury secondary to dehydration  Creatinine improving with IV fluids. Hyperphosphatemia  Corrected. Hyperglycemia  Secondary to TNA. Metabolic alkalosis  Secondary to gastric hydrogen loss from chronic G-tube drainage. Some respiratory compensation noted.  Corrected. Hypernatremia  Secondary to dehydration. Urine osmolality 718.  Per Dr. Arta Silence of nephrology - recommendation for replacing output with D5W and running NS at 100 cc/hr. Hypokalemia  Add 60 mEq per liter to IV fluids and give potassium runs as needed.  Recent MRSA bacteremia / Fever  Continue vancomycin through 02/15/2013. SBO (small bowel obstruction)  Continue G-tube suction. Leukocytosis  Likely reflective of dehydration and volume contraction. Improving with IVF. Peritoneal carcinomatosis  Poor prognosis. Requested a consultation with oncology. [name] out of town, to return 01/21/13. Anemia of chronic disease  Hemoglobin stable. No current indication for transfusion. Colo-enteric  fistula  Seen by wound ostomy RN 01/18/2013. Acute cholecystitis s/p perc draianage [id number]  Drain intact. Drain changed 12/22/2012. Severe  protein-calorie malnutrition  Continue TNA. Elevated lipase  Likely from carcinomatosis. Thrombocytosis  Likely reactive or reflective of dehydration. Resolved with hydration. Elevated LFTs  Likely from liver metastasis and intra-abdominal process. Pleural effusion, left  Likely a malignant effusion. Consider thoracentesis for symptomatic treatment. Hypercalcemia of malignancy  Hydrating. Given a dose of pamidronate 01/17/13 with improvement in calcium level.   Code Status: LCB, no intubation but does want CPR/defibrillation/drugs  Family Communication: [name] (wife) [phone] not at bedside, left message on answering machine. Brother updated at bedside 01/19/2013.  Disposition Plan: Transfer to [facility] in [place]  Medical Consultants:  Telephone consultation with Dr. Delano Metz, Nephrology.  Dr. Eli Hose, Oncology. Other Consultants:  Wound care RN Anti-infectives:  Vancomycin x 6 weeks (to finish 02/15/13)    Discharge Exam: Filed Vitals:   01/21/13 0530  BP: 108/72  Pulse: 66  Temp: 98 F (36.7 C)  Resp: 18   Filed Vitals:   01/20/13 0437 01/20/13 1240 01/20/13 2041 01/21/13 0530  BP: 100/59 104/70 107/75 108/72  Pulse: 95 87 80 66  Temp: 98.7 F (37.1 C) 98.7 F (37.1 C) 98.5 F (36.9 C) 98 F (36.7 C)  TempSrc: Oral Oral Oral Oral  Resp: 18 18 18 18   Height:      Weight:      SpO2: 100% 100% 100% 100%    General: Pt is alert, follows commands appropriately, not in acute distress Cardiovascular: Regular rate and rhythm, S1/S2 +, no murmurs, no rubs, no gallops Respiratory: Clear to auscultation bilaterally, no wheezing, no crackles, no rhonchi Abdominal: Soft, non tender, non distended, G-tube, colostomy and ileostomy in place with no stigmata of infection Extremities: no edema, no  cyanosis, pulses palpable bilaterally DP and PT Neuro: Grossly nonfocal  Discharge Instructions  Discharge Orders    Future Orders Please Complete By Expires   Diet - low sodium heart healthy      Increase activity slowly      Call MD for:  persistant nausea and vomiting      Call MD for:  severe uncontrolled pain      Call MD for:  difficulty breathing, headache or visual disturbances      Call MD for:  persistant dizziness or light-headedness          Medication List     As of 01/21/2013  1:43 PM    STOP taking these medications         sodium chloride 0.9 % SOLN 250 mL with vancomycin 1000 MG SOLR 1,000 mg      TAKE these medications         antiseptic oral rinse Liqd   15 mLs by Mouth Rinse route 2 times daily at 12 noon and 4 pm.      chlorhexidine 0.12 % solution   Commonly known as: PERIDEX   Use as directed 15 mLs in the mouth or throat 2 (two) times daily.      diclofenac sodium 1 % Gel   Commonly known as: VOLTAREN   Apply 2 g topically 2 (two) times daily as needed (knee pain).      menthol-cetylpyridinium 3 MG lozenge   Commonly known as: CEPACOL   Take 1 lozenge by mouth every 2 (two) hours as needed. Sore throat      methylPREDNISolone sodium succinate 40 mg/mL injection   Commonly known as: SOLU-MEDROL   Inject 1 mL (40 mg total) into the vein daily.      morphine  CONCENTRATE 10 mg / 0.5 ml concentrated solution   Take 5 mg by mouth every hour as needed. Pain      morphine 4 MG/ML injection   Inject 1 mL (4 mg total) into the vein every 3 (three) hours as needed for severe pain.      nitroGLYCERIN 0.4 MG SL tablet   Commonly known as: NITROSTAT   Place 0.4 mg under the tongue every 5 (five) minutes as needed. For chest pain, Hold for SBP<100      ondansetron 4 MG/2ML Soln injection   Commonly known as: ZOFRAN   Inject 2 mLs (4 mg total) into the vein every 6 (six) hours as needed for nausea.      ondansetron 4 MG tablet   Commonly known as:  ZOFRAN   Take 4 mg by mouth every 8 (eight) hours as needed. Nausea      pregabalin 25 MG capsule   Commonly known as: LYRICA   Take 25 mg by mouth 2 (two) times daily.      PROTONIX 40 MG injection   Generic drug: pantoprazole   Inject 40 mg into the vein every 12 (twelve) hours.      TPN ELECTROLYTES FTV IV   Inject 3,000 mLs into the vein daily. 10ml addamel, 10ml MVI, 30 units insulin      vancomycin 1 GM/200ML Soln   Commonly known as: VANCOCIN   Inject 200 mLs (1,000 mg total) into the vein daily.           Follow-up Information    Follow up with LEVINE,EDWARD A, MD. In 2 weeks.   Contact information:   [address] [place] [address] [phone]           The results of significant diagnostics from this hospitalization (including imaging, microbiology, ancillary and laboratory) are listed below for reference.    Significant Diagnostic Studies: Dg Elbow 2 Views Left  01/08/2013  *RADIOLOGY REPORT*  Clinical Data: Left elbow pain. Edema.  Bursitis.  LEFT ELBOW - 2 VIEW  Comparison: Radiographs dated 04/28/2008  Findings: There is prominent soft tissue swelling in the region of the olecranon bursa but extending proximally adjacent to the distal humerus.  The soft tissue swelling extends anterior to the distal humerus.  There is no fracture or dislocation.  There is a slight arthritic blurring in the elbow joint.  There is an 8 mm irregular foreign body which is adjacent to the lateral epicondyle which appears to be above the origin of the common extensor tendon.  On the lateral view there appears to be a loose body posteriorly in the elbow joint.  IMPRESSION:  1.  Marked soft tissue swelling around the elbow joint but there is no definitive joint effusion.  The soft tissue swelling extends anterior to the distal humerus and is more than a simple olecranon bursitis. 2.  Osteoarthritic changes of the elbow joint. 3.  Probable loose body in the joint. 4.  Probable  foreign body in the soft tissues adjacent to the lateral epicondyle.   Original Report Authenticated By: Francene Boyers, M.D.    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  The patient is also status post prior percutaneous cholecystostomy tube placement for acalculous cholecystitis.  Assessment of the cholecystostomy tube is performed.  1.  CHOLANGIOGRAM THROUGH PREEXISTING CATHETER 2.  PERCUTANEOUS CHOLECYSTOSTOMY TUBE EXCHANGE  Contrast:  50 ml Omnipaque-300  Fluoroscopy Time: 7.5 minutes.  Procedure:  The procedure, risks, benefits, and alternatives were explained to the patient.  Questions regarding the procedure were encouraged and answered.  The patient understands and consents to the procedure.  The preexisting percutaneous cholecystostomy tube was prepped with Betadine in a sterile fashion, and a sterile drape was applied covering the operative field.  A sterile gown and sterile gloves were used for the procedure.  The preexisting catheter was injected with contrast material under fluoroscopy.  It was then removed over a guidewire.  A 5-French catheter was then introduced over a guidewire and advanced into the lumen of the gallbladder.  Additional contrast injection was performed.  The catheter was further advanced through the cystic duct and into the common bile duct and additional cholangiogram performed through the catheter.  The catheter was then retracted into the lumen of the gallbladder.  A new 10-French multipurpose drainage catheter was advanced over a wire and formed in the gallbladder lumen.  Final catheter position was confirmed with a fluoroscopic spot image obtained after injection of contrast.  The catheter was secured at the skin with a Prolene retention suture and Stat-Lock device.  The catheter was connected to a gravity drainage bag.  Complications:  None   Findings:  Injection of a preexisting drain shows the catheter to lie just outside the lumen of the gallbladder.  Contrast injection does partially fill the gallbladder lumen and demonstrates a significant amount of internal debris and sludge within the gallbladder.  After removal of the preexisting catheter, a percutaneous tract to the gallbladder lumen was able to be catheterized.  Cholangiogram shows very slow and sluggish flow of contrast via the cystic duct into the common bile duct.  A significant amount of debris is present extending into the cystic duct and also within the distal aspect of the common bile duct.  With a catheter further advanced into the CBD, contrast injection does show a patent common bile duct with flow of contrast noted via the ampulla into the duodenum.  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  There is a fistula demonstrated to adjacent small bowel with eventual opacification of several right sided small bowel loops demonstrating poor motility.  An additional fistula extends medially into a space adjacent to the descending duodenum.  Given demonstration of fistulas as well as poor flow of contrast from the gallbladder into the common bile duct, a new cholecystostomy tube was placed and formed at the level of the gallbladder lumen.  This will be left to gravity drainage.  IMPRESSION: The preexisting catheter lies just outside the lumen of the gallbladder.  After recanalizing a tract to the gallbladder lumen, contrast injection demonstrates significant debris in the lumen of the gallbladder and poor flow of contrast into the common bile duct.  Debris was also present in the distal aspect of the CBD. The study also demonstrated complex fistulas communicating with the gallbladder lumen and opacifying both small bowel as well as a tract to a cavity lying adjacent to the descending duodenum.  A new 10-French catheter was  formed in the gallbladder and will be left to gravity drainage.   Original Report Authenticated By: Irish Lack, M.D.    Ir Gastrostomy Tube Mod Sed  01/01/2013  *RADIOLOGY REPORT*  PULL THROUGH GASTROSTOMY TUBE PLACEMENT UNDER FLUOROSCOPIC GUIDANCE  Date: 01/01/2013  Clinical History: 57 year old male with terminal peritoneal carcinomatosis and jejunal obstruction.  He requires a diverting gastrostomy tube for palliation.  Procedures Performed:  1.  Fluoroscopically guided placement of percutaneous pull-through gastrostomy tube.  Interventional Radiologist:  Sterling Big, MD  Sedation: Moderate (conscious) sedation was used.  To the mg Versed, 100 mcg Fentanyl were administered intravenously.  The patient's vital signs were monitored continuously by radiology nursing throughout the procedure.  Sedation Time: 30 minutes  Fluoroscopy time: 4.8 minutes  Contrast volume: 10 ml Omnipaque-300 administered into the stomach  PROCEDURE/FINDINGS:   Informed consent was obtained from the patient following explanation of the procedure, risks, benefits and alternatives. The patient understands, agrees and consents for the procedure. All questions were addressed. A time out was performed.  Maximal barrier sterile technique utilized including caps, mask, sterile gowns, sterile gloves, large sterile drape, hand hygiene, and chlorhexadine skin prep.  An angled catheter was advanced over a wire under fluoroscopic guidance through the nose, down the esophagus and into the body of the stomach.  The stomach was then insufflated with several 100 ml of air.  Fluoroscopy confirmed location of the gastric bubble, as well as inferior displacement of the barium stained colon.  Under direct fluoroscopic guidance, a single T-tack was placed, and the anterior gastric wall drawn up against the anterior abdominal wall. Percutaneous access was then obtained into the mid gastric body with an 18 gauge sheath needle.  Aspiration of air,  and injection of contrast material under fluoroscopy confirmed needle placement.  An Amplatz wire was advanced in the gastric body.  The tract was then serially dilated to 12-French.  A 12 [place] Wills-Oglesby decompressive gastrostomy tube was then advanced over the wire and positioned within the gastric body.  A gentle hand injection of contrast material confirmed intragastric location.  This single T tack was cut.  The tube was secured to the skin with both Prolene suture.  There is no immediate complication, the patient tolerated the procedure well.  The patient will be observed for several hours with the newly placed tube on low wall suction to evaluate for any post procedure complication.  IMPRESSION:  Successful placement of a 3 French decompressive gastrostomy tube. The tube is designed for decompression only and is not designed for administration of nutrition or medications.  Connect to low wall suction as needed.  Signed,  Sterling Big, MD Vascular & Interventional Radiologist [facility]   Original Report Authenticated By: Malachy Moan, M.D.    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  The patient is also status post prior percutaneous cholecystostomy tube placement for acalculous cholecystitis.  Assessment of the cholecystostomy tube is performed.  1.  CHOLANGIOGRAM THROUGH PREEXISTING CATHETER 2.  PERCUTANEOUS CHOLECYSTOSTOMY TUBE EXCHANGE  Contrast:  50 ml Omnipaque-300  Fluoroscopy Time: 7.5 minutes.  Procedure:  The procedure, risks, benefits, and alternatives were explained to the patient.  Questions regarding the procedure were encouraged and answered.  The patient understands and consents to the procedure.  The preexisting percutaneous cholecystostomy tube was prepped with Betadine in a sterile fashion, and a sterile drape was applied covering  the operative field.  A sterile gown and sterile gloves were used for the procedure.  The preexisting catheter was injected with contrast material under fluoroscopy.  It was then removed over a guidewire.  A 5-French catheter was then introduced over a guidewire and advanced into the lumen of the gallbladder.  Additional contrast  injection was performed.  The catheter was further advanced through the cystic duct and into the common bile duct and additional cholangiogram performed through the catheter.  The catheter was then retracted into the lumen of the gallbladder.  A new 10-French multipurpose drainage catheter was advanced over a wire and formed in the gallbladder lumen.  Final catheter position was confirmed with a fluoroscopic spot image obtained after injection of contrast.  The catheter was secured at the skin with a Prolene retention suture and Stat-Lock device.  The catheter was connected to a gravity drainage bag.  Complications:  None  Findings:  Injection of a preexisting drain shows the catheter to lie just outside the lumen of the gallbladder.  Contrast injection does partially fill the gallbladder lumen and demonstrates a significant amount of internal debris and sludge within the gallbladder.  After removal of the preexisting catheter, a percutaneous tract to the gallbladder lumen was able to be catheterized.  Cholangiogram shows very slow and sluggish flow of contrast via the cystic duct into the common bile duct.  A significant amount of debris is present extending into the cystic duct and also within the distal aspect of the common bile duct.  With a catheter further advanced into the CBD, contrast injection does show a patent common bile duct with flow of contrast noted via the ampulla into the duodenum.  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  There is a fistula demonstrated to adjacent small bowel with eventual  opacification of several right sided small bowel loops demonstrating poor motility.  An additional fistula extends medially into a space adjacent to the descending duodenum.  Given demonstration of fistulas as well as poor flow of contrast from the gallbladder into the common bile duct, a new cholecystostomy tube was placed and formed at the level of the gallbladder lumen.  This will be left to gravity drainage.  IMPRESSION: The preexisting catheter lies just outside the lumen of the gallbladder.  After recanalizing a tract to the gallbladder lumen, contrast injection demonstrates significant debris in the lumen of the gallbladder and poor flow of contrast into the common bile duct.  Debris was also present in the distal aspect of the CBD. The study also demonstrated complex fistulas communicating with the gallbladder lumen and opacifying both small bowel as well as a tract to a cavity lying adjacent to the descending duodenum.  A new 10-French catheter was formed in the gallbladder and will be left to gravity drainage.   Original Report Authenticated By: Irish Lack, M.D.    Ir Removal Tun Cv Cath W/o Fl  01/08/2013  *RADIOLOGY REPORT*  Clinical Data: Left chest Port-A-Cath reportedly placed by surgery at [facility].  The patient has had MRSA bacteremia and is no longer using the port.  Removal is requested with culture of the catheter tip.  REMOVAL TUNNELED CENTRAL VENOUS CATHETER  Comparison: Prior chest x-ray 12/22/2012  Findings: Right informed consent was obtained prior to the procedure.  The left anterior chest was prepped and draped in a standard sterile fashion.  Local anesthesia was achieved by infiltration of 1% lidocaine.  An incision was carried down to the existing scar tissue onto the top of the port.  A combination of blunt and sharp surgical dissection was used to free the port from the encapsulating scar tissue.  The port was then removed with gentle traction.  The port was  inspected and found  to be complete completely intact.  The port reservoir showed no evidence of gross infection.  Was copiously irrigated with sterile saline and closed in multiple layers using a combination of interrupted inverted 3-0 Vicryl sutures and a running 4-0 Monocryl subcuticular suture.  The skin was sealed with Dermabond.  The patient tolerated procedure well, there is no complication. The tip of the catheter was severed sent for culture.  IMPRESSION:  1.  Successful removal of left anterior chest portacatheter. 2.  The tip was sent for culture.   Original Report Authenticated By: Malachy Moan, M.D.    Dg Abd Acute W/chest  01/17/2013  *RADIOLOGY REPORT*  Clinical Data: Nausea, vomiting, peritoneal carcinoma.  ACUTE ABDOMEN SERIES (ABDOMEN 2 VIEW & CHEST 1 VIEW)  Comparison: 12/25/2012 and earlier studies  Findings: Right arm PICC extends to the proximal right atrium. Interval increase in left pleural effusion with increasing atelectasis/consolidation in the left lower lung.  Right lung clear.  Heart size upper limits normal.  Gastrostomy tube projects in expected location.  No free air. Pigtail drain in the right mid abdomen stable.  Enteric stent in the left mid abdomen stable.  Surgical drain projects laterally in the left abdomen as before.  There is a relative paucity of bowel gas.  No dilated loops of bowel are evident.  No abnormal abdominal calcifications.  Anastomotic staple line in the right mid abdomen.  IMPRESSION:  1.  Interval increase in the moderate left pleural effusion with adjacent consolidation / atelectasis. 2.  Nonobstructive bowel gas pattern. 3.  No free air. 4.  Postop changes as above.   Original Report Authenticated By: D. Andria Rhein, MD    Dg Abd Portable 1v  12/25/2012  *RADIOLOGY REPORT*  Clinical Data: NG tube placement  PORTABLE ABDOMEN - 1 VIEW  Comparison: 12/15/2012  Findings: Enteric stent remains in place.  Surgical drain along the left side of abdomen.   Cholecystostomy tube projects over the gallbladder lumen.  NG tube placed with its tip in the fundus of the stomach.  No obvious free intraperitoneal gas.  No disproportionate dilatation of bowel.  IMPRESSION: NG tube tip is in the fundus of the stomach.   Original Report Authenticated By: Jolaine Click, M.D.    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  UPPER GI SERIES WITH KUB  Technique:  Routine upper GI series was performed with Omnipaque- 300  Fluoroscopy Time: 1.47 minutes  Comparison:  CT scan 12/16/2012  Findings: Contrast was infused via the NG tube in the stomach. Stomach is unremarkable.  Very little peristaltic action was demonstrated but contrast did enter into the duodenum with the patient on the right side and elevated.  Occasional GE reflux was noted.  There was to and fro movement of contrast in the second portion of the duodenum which was mildly dilated.  Very little contrast entered the duodenal stent but a small amount did pass through it.  Very little contrast was seen beyond the stent.  IMPRESSION:  1.  Mild dilatation of the duodenum with very little contrast seen entering the duodenal stent. 2.  No dilated distal small bowel to suggest obstruction.   Original Report Authenticated By: Rudie Meyer, M.D.    Dg C-arm 61-120 Min-no Report  12/30/2012  CLINICAL DATA: duodenal stricture   C-ARM 61-120 MINUTES  Fluoroscopy was utilized by the requesting physician.  No radiographic  interpretation.  Microbiology: Recent Results (from the past 240 hour(s))  MRSA PCR SCREENING     Status: Normal   Collection Time   01/17/13  6:05 PM      Component Value Range Status Comment   MRSA by PCR NEGATIVE  NEGATIVE Final      Labs: Basic Metabolic Panel:  Lab 01/21/13 1478 01/20/13 1650 01/20/13 0500 01/19/13 0630 01/19/13 0100 01/18/13 0542  NA 131* 128* 129* 136 138 --  K 4.4 3.9 2.5*  3.1* 2.9* --  CL 97 92* 91* 92* 92* --  CO2 26 28 31  36* 36* --  GLUCOSE 211* 234* 157* 129* 256* --  BUN 33* 37* 49* 69* 74* --  CREATININE 1.04 1.07 1.28 1.47* 1.55* --  CALCIUM 9.1 9.1 9.2 10.0 10.1 --  MG -- -- 1.7 1.6 -- 1.9  PHOS -- -- 2.7 2.5 -- 5.4*   Liver Function Tests:  Lab 01/20/13 0500 01/19/13 0630 01/19/13 0100 01/18/13 0542 01/17/13 1055  AST 27 31 31  40* 55*  ALT 43 54* 57* 74* 89*  ALKPHOS 219* 247* 259* 326* 355*  BILITOT 0.9 1.5* 1.2 1.3* 1.0  PROT 6.3 6.3 6.5 7.2 8.5*  ALBUMIN 2.0* 2.2* 2.3* 2.5* 2.9*    Lab 01/17/13 1055  LIPASE 81*  AMYLASE --   No results found for this basename: AMMONIA:5 in the last 168 hours CBC:  Lab 01/19/13 0630 01/19/13 0100 01/18/13 0542 01/17/13 1055  WBC 7.4 7.2 12.1* 17.7*  NEUTROABS 5.4 5.9 9.4* 12.9*  HGB 8.6* 8.9* 9.8* 10.2*  HCT 26.5* 27.9* 31.9* 32.0*  MCV 86.0 87.5 89.4 88.2  PLT 269 299 344 459*   Cardiac Enzymes: No results found for this basename: CKTOTAL:5,CKMB:5,CKMBINDEX:5,TROPONINI:5 in the last 168 hours BNP: BNP (last 3 results)  Basename 12/22/12 1700  PROBNP 33.8   CBG:  Lab 01/21/13 0728 01/21/13 0619 01/21/13 0153 01/20/13 2351 01/20/13 1955  GLUCAP 181* 190* 192* 170* 260*    Time coordinating discharge: Over 30 minutes  Signed:  [name], MD  [facility] 01/21/2013, 1:43 PM  Pager #: [phone]

## 2013-01-21 NOTE — Progress Notes (Signed)
Patient d/c via [facility] at around 1712,patient will go to [facility] and oriented x4, stable on d/c,PICC line flushed per protocol, denies pain.Hulda Marin RN

## 2013-01-22 NOTE — Progress Notes (Signed)
Follow up visit:  4pm - 4:45pm  [facility] SW obtaining signatures on revocation forms.  Pt revoking hospice medicare benefit to transfer to [facility], [place], [place] via [facility] for care by Dr. Ricke Hey.  Patient's son and brother are present in the room also.  [facility] needed MOST form.  This was completed and reviewed by the patient's brother for correct information.  Staff RN met Dr. Elisabeth Pigeon at the [facility] entrance for her signature as she had left the hospital.  Pt was appreciative of the [facility] services and was fully instructed on how to reinstitute hospice services once he has returned to his home in [place].  [facility] SW [place] gave pt copies of all the forms signed and copy of his discharge (AVS) papers in a packet for his information.  Chalmers Cater, RN [facility] [phone]

## 2013-04-03 ENCOUNTER — Inpatient Hospital Stay (HOSPITAL_COMMUNITY)
Admission: EM | Admit: 2013-04-03 | Discharge: 2013-04-17 | DRG: 682 | Disposition: A | Discharge status: Other Acute Inpatient Hospital | Payer: 59 | Attending: Internal Medicine | Admitting: Internal Medicine

## 2013-04-03 ENCOUNTER — Emergency Department (HOSPITAL_COMMUNITY): Payer: 59

## 2013-04-03 ENCOUNTER — Encounter (HOSPITAL_COMMUNITY): Payer: Self-pay | Admitting: Internal Medicine

## 2013-04-03 DIAGNOSIS — E871 Hypo-osmolality and hyponatremia: Secondary | ICD-10-CM

## 2013-04-03 DIAGNOSIS — A419 Sepsis, unspecified organism: Secondary | ICD-10-CM

## 2013-04-03 DIAGNOSIS — C762 Malignant neoplasm of abdomen: Secondary | ICD-10-CM

## 2013-04-03 DIAGNOSIS — Z9221 Personal history of antineoplastic chemotherapy: Secondary | ICD-10-CM

## 2013-04-03 DIAGNOSIS — K5900 Constipation, unspecified: Secondary | ICD-10-CM

## 2013-04-03 DIAGNOSIS — K219 Gastro-esophageal reflux disease without esophagitis: Secondary | ICD-10-CM | POA: Diagnosis present

## 2013-04-03 DIAGNOSIS — J90 Pleural effusion, not elsewhere classified: Secondary | ICD-10-CM

## 2013-04-03 DIAGNOSIS — R109 Unspecified abdominal pain: Secondary | ICD-10-CM

## 2013-04-03 DIAGNOSIS — C786 Secondary malignant neoplasm of retroperitoneum and peritoneum: Secondary | ICD-10-CM

## 2013-04-03 DIAGNOSIS — D473 Essential (hemorrhagic) thrombocythemia: Secondary | ICD-10-CM

## 2013-04-03 DIAGNOSIS — R531 Weakness: Secondary | ICD-10-CM

## 2013-04-03 DIAGNOSIS — IMO0002 Reserved for concepts with insufficient information to code with codable children: Secondary | ICD-10-CM

## 2013-04-03 DIAGNOSIS — E860 Dehydration: Secondary | ICD-10-CM

## 2013-04-03 DIAGNOSIS — E870 Hyperosmolality and hypernatremia: Secondary | ICD-10-CM

## 2013-04-03 DIAGNOSIS — B965 Pseudomonas (aeruginosa) (mallei) (pseudomallei) as the cause of diseases classified elsewhere: Secondary | ICD-10-CM | POA: Diagnosis not present

## 2013-04-03 DIAGNOSIS — M109 Gout, unspecified: Secondary | ICD-10-CM

## 2013-04-03 DIAGNOSIS — B9562 Methicillin resistant Staphylococcus aureus infection as the cause of diseases classified elsewhere: Secondary | ICD-10-CM

## 2013-04-03 DIAGNOSIS — D72829 Elevated white blood cell count, unspecified: Secondary | ICD-10-CM

## 2013-04-03 DIAGNOSIS — D638 Anemia in other chronic diseases classified elsewhere: Secondary | ICD-10-CM

## 2013-04-03 DIAGNOSIS — Z933 Colostomy status: Secondary | ICD-10-CM

## 2013-04-03 DIAGNOSIS — C799 Secondary malignant neoplasm of unspecified site: Secondary | ICD-10-CM

## 2013-04-03 DIAGNOSIS — R748 Abnormal levels of other serum enzymes: Secondary | ICD-10-CM

## 2013-04-03 DIAGNOSIS — A4101 Sepsis due to Methicillin susceptible Staphylococcus aureus: Secondary | ICD-10-CM

## 2013-04-03 DIAGNOSIS — R112 Nausea with vomiting, unspecified: Secondary | ICD-10-CM

## 2013-04-03 DIAGNOSIS — N390 Urinary tract infection, site not specified: Secondary | ICD-10-CM

## 2013-04-03 DIAGNOSIS — R1110 Vomiting, unspecified: Secondary | ICD-10-CM

## 2013-04-03 DIAGNOSIS — C482 Malignant neoplasm of peritoneum, unspecified: Secondary | ICD-10-CM

## 2013-04-03 DIAGNOSIS — I10 Essential (primary) hypertension: Secondary | ICD-10-CM | POA: Diagnosis present

## 2013-04-03 DIAGNOSIS — K632 Fistula of intestine: Secondary | ICD-10-CM

## 2013-04-03 DIAGNOSIS — K812 Acute cholecystitis with chronic cholecystitis: Secondary | ICD-10-CM

## 2013-04-03 DIAGNOSIS — Z931 Gastrostomy status: Secondary | ICD-10-CM

## 2013-04-03 DIAGNOSIS — Z9049 Acquired absence of other specified parts of digestive tract: Secondary | ICD-10-CM

## 2013-04-03 DIAGNOSIS — N179 Acute kidney failure, unspecified: Principal | ICD-10-CM

## 2013-04-03 DIAGNOSIS — E861 Hypovolemia: Secondary | ICD-10-CM | POA: Diagnosis present

## 2013-04-03 DIAGNOSIS — K56609 Unspecified intestinal obstruction, unspecified as to partial versus complete obstruction: Secondary | ICD-10-CM

## 2013-04-03 DIAGNOSIS — I959 Hypotension, unspecified: Secondary | ICD-10-CM

## 2013-04-03 DIAGNOSIS — R7989 Other specified abnormal findings of blood chemistry: Secondary | ICD-10-CM

## 2013-04-03 DIAGNOSIS — R739 Hyperglycemia, unspecified: Secondary | ICD-10-CM

## 2013-04-03 DIAGNOSIS — E876 Hypokalemia: Secondary | ICD-10-CM

## 2013-04-03 DIAGNOSIS — D75839 Thrombocytosis, unspecified: Secondary | ICD-10-CM

## 2013-04-03 DIAGNOSIS — G589 Mononeuropathy, unspecified: Secondary | ICD-10-CM | POA: Diagnosis present

## 2013-04-03 DIAGNOSIS — D61818 Other pancytopenia: Secondary | ICD-10-CM

## 2013-04-03 DIAGNOSIS — R7881 Bacteremia: Secondary | ICD-10-CM

## 2013-04-03 DIAGNOSIS — E873 Alkalosis: Secondary | ICD-10-CM

## 2013-04-03 DIAGNOSIS — E43 Unspecified severe protein-calorie malnutrition: Secondary | ICD-10-CM

## 2013-04-03 DIAGNOSIS — R579 Shock, unspecified: Secondary | ICD-10-CM

## 2013-04-03 DIAGNOSIS — K921 Melena: Secondary | ICD-10-CM

## 2013-04-03 DIAGNOSIS — Z79899 Other long term (current) drug therapy: Secondary | ICD-10-CM

## 2013-04-03 LAB — GLUCOSE, CAPILLARY: Glucose-Capillary: 165 mg/dL — ABNORMAL HIGH (ref 70–99)

## 2013-04-03 LAB — CBC WITH DIFFERENTIAL/PLATELET
Basophils Relative: 2 % — ABNORMAL HIGH (ref 0–1)
Basophils Relative: 0 % (ref 0–1)
Eosinophils Absolute: 0 10*3/uL (ref 0.0–0.7)
Eosinophils Relative: 1 % (ref 0–5)
HCT: 49.4 % (ref 39.0–52.0)
HCT: 29.7 % — ABNORMAL LOW (ref 39.0–52.0)
Hemoglobin: 16.9 g/dL (ref 13.0–17.0)
Hemoglobin: 9.6 g/dL — ABNORMAL LOW (ref 13.0–17.0)
Lymphocytes Relative: 34 % (ref 12–46)
Lymphocytes Relative: 34 % (ref 12–46)
Monocytes Relative: 12 % (ref 3–12)
Monocytes Relative: 10 % (ref 3–12)
Neutro Abs: 2.1 10*3/uL (ref 1.7–7.7)
Neutro Abs: 1.3 10*3/uL — ABNORMAL LOW (ref 1.7–7.7)
Neutrophils Relative %: 51 % (ref 43–77)
Neutrophils Relative %: 55 % (ref 43–77)
RBC: 5.16 MIL/uL (ref 4.22–5.81)
RBC: 3.04 MIL/uL — ABNORMAL LOW (ref 4.22–5.81)
WBC Morphology: INCREASED
WBC Morphology: INCREASED
WBC: 4.1 10*3/uL (ref 4.0–10.5)
WBC: 2.3 10*3/uL — ABNORMAL LOW (ref 4.0–10.5)

## 2013-04-03 LAB — COMPREHENSIVE METABOLIC PANEL
ALT: 70 U/L — ABNORMAL HIGH (ref 0–53)
ALT: 58 U/L — ABNORMAL HIGH (ref 0–53)
BUN: 76 mg/dL — ABNORMAL HIGH (ref 6–23)
BUN: 68 mg/dL — ABNORMAL HIGH (ref 6–23)
CO2: 44 mEq/L (ref 19–32)
Calcium: 10.4 mg/dL (ref 8.4–10.5)
Calcium: 8.9 mg/dL (ref 8.4–10.5)
Creatinine, Ser: 1.38 mg/dL — ABNORMAL HIGH (ref 0.50–1.35)
Creatinine, Ser: 1.21 mg/dL (ref 0.50–1.35)
GFR calc Af Amer: 65 mL/min — ABNORMAL LOW (ref >90)
GFR calc Af Amer: 76 mL/min — ABNORMAL LOW (ref >90)
GFR calc non Af Amer: 56 mL/min — ABNORMAL LOW (ref >90)
GFR calc non Af Amer: 65 mL/min — ABNORMAL LOW (ref >90)
Glucose, Bld: 98 mg/dL (ref 70–99)
Glucose, Bld: 129 mg/dL — ABNORMAL HIGH (ref 70–99)
Sodium: 141 mEq/L (ref 135–145)
Sodium: 147 mEq/L — ABNORMAL HIGH (ref 135–145)
Total Protein: 7.9 g/dL (ref 6.0–8.3)
Total Protein: 6.7 g/dL (ref 6.0–8.3)

## 2013-04-03 LAB — MRSA PCR SCREENING: MRSA by PCR: POSITIVE — AB

## 2013-04-03 LAB — URINALYSIS, ROUTINE W REFLEX MICROSCOPIC
Ketones, ur: NEGATIVE mg/dL
Nitrite: NEGATIVE
Specific Gravity, Urine: 1.028 (ref 1.005–1.030)
Urobilinogen, UA: 0.2 mg/dL (ref 0.0–1.0)
pH: 8 (ref 5.0–8.0)

## 2013-04-03 LAB — URINE MICROSCOPIC-ADD ON

## 2013-04-03 LAB — TROPONIN I: Troponin I: <0.3 ng/mL (ref <0.30)

## 2013-04-03 LAB — LACTIC ACID, PLASMA: Lactic Acid, Venous: 1.4 mmol/L (ref 0.5–2.2)

## 2013-04-03 MED ORDER — SODIUM CHLORIDE 0.9 % IV SOLN
INTRAVENOUS | Status: AC
  Administered 2013-04-03 (×2): via INTRAVENOUS

## 2013-04-03 MED ORDER — ZOLPIDEM TARTRATE 5 MG PO TABS
5.0000 mg | ORAL_TABLET | Freq: Once | ORAL | Status: AC
  Administered 2013-04-03: 5 mg via ORAL
  Filled 2013-04-03: qty 1

## 2013-04-03 MED ORDER — FAT EMULSION 20 % IV EMUL
240.0000 mL | INTRAVENOUS | Status: AC
  Administered 2013-04-03: 240 mL via INTRAVENOUS
  Filled 2013-04-03: qty 250

## 2013-04-03 MED ORDER — PANTOPRAZOLE SODIUM 40 MG PO TBEC
40.0000 mg | DELAYED_RELEASE_TABLET | Freq: Every morning | ORAL | Status: DC
  Administered 2013-04-03 – 2013-04-17 (×15): 40 mg via ORAL
  Filled 2013-04-03 (×15): qty 1

## 2013-04-03 MED ORDER — PROMETHAZINE HCL 25 MG PO TABS
12.5000 mg | ORAL_TABLET | Freq: Four times a day (QID) | ORAL | Status: DC | PRN
  Administered 2013-04-13: 12.5 mg via ORAL
  Filled 2013-04-03 (×3): qty 1

## 2013-04-03 MED ORDER — SODIUM CHLORIDE 0.9 % IV BOLUS (SEPSIS)
1000.0000 mL | Freq: Once | INTRAVENOUS | Status: AC
  Administered 2013-04-03: 1000 mL via INTRAVENOUS

## 2013-04-03 MED ORDER — FLUDROCORTISONE ACETATE 0.1 MG PO TABS
0.1000 mg | ORAL_TABLET | Freq: Two times a day (BID) | ORAL | Status: DC
  Administered 2013-04-03 – 2013-04-17 (×29): 0.1 mg via ORAL
  Filled 2013-04-03 (×31): qty 1

## 2013-04-03 MED ORDER — SODIUM CHLORIDE 0.9 % IJ SOLN
3.0000 mL | Freq: Two times a day (BID) | INTRAMUSCULAR | Status: DC
  Administered 2013-04-03: 10 mL via INTRAVENOUS
  Administered 2013-04-04: 3 mL via INTRAVENOUS
  Administered 2013-04-05: 10 mL via INTRAVENOUS
  Administered 2013-04-12: 3 mL via INTRAVENOUS
  Administered 2013-04-14: 10 mL via INTRAVENOUS
  Administered 2013-04-15 – 2013-04-16 (×4): 3 mL via INTRAVENOUS

## 2013-04-03 MED ORDER — PROMETHAZINE HCL 25 MG/ML IJ SOLN
12.5000 mg | Freq: Four times a day (QID) | INTRAMUSCULAR | Status: DC | PRN
  Administered 2013-04-03 – 2013-04-17 (×10): 12.5 mg via INTRAVENOUS
  Filled 2013-04-03 (×10): qty 1

## 2013-04-03 MED ORDER — SODIUM CHLORIDE 0.9 % IV SOLN
INTRAVENOUS | Status: DC
  Administered 2013-04-04: 07:00:00 via INTRAVENOUS

## 2013-04-03 MED ORDER — MIDODRINE HCL 5 MG PO TABS
10.0000 mg | ORAL_TABLET | Freq: Three times a day (TID) | ORAL | Status: DC
  Administered 2013-04-03 – 2013-04-17 (×43): 10 mg via ORAL
  Filled 2013-04-03 (×47): qty 2

## 2013-04-03 MED ORDER — ZINC SULFATE 220 (50 ZN) MG PO CAPS
220.0000 mg | ORAL_CAPSULE | Freq: Every morning | ORAL | Status: DC
  Administered 2013-04-03 – 2013-04-17 (×15): 220 mg via ORAL
  Filled 2013-04-03 (×15): qty 1

## 2013-04-03 MED ORDER — ENOXAPARIN SODIUM 40 MG/0.4ML ~~LOC~~ SOLN
40.0000 mg | SUBCUTANEOUS | Status: DC
  Administered 2013-04-03 – 2013-04-17 (×15): 40 mg via SUBCUTANEOUS
  Filled 2013-04-03 (×15): qty 0.4

## 2013-04-03 MED ORDER — TRACE MINERALS CR-CU-F-FE-I-MN-MO-SE-ZN IV SOLN
INTRAVENOUS | Status: AC
  Administered 2013-04-03: 17:00:00 via INTRAVENOUS
  Filled 2013-04-03: qty 2000

## 2013-04-03 MED ORDER — POTASSIUM CHLORIDE 10 MEQ/100ML IV SOLN
10.0000 meq | INTRAVENOUS | Status: AC
  Administered 2013-04-03 (×3): 10 meq via INTRAVENOUS
  Filled 2013-04-03 (×3): qty 100

## 2013-04-03 MED ORDER — CHLORHEXIDINE GLUCONATE 0.12 % MT SOLN
15.0000 mL | Freq: Two times a day (BID) | OROMUCOSAL | Status: DC
  Administered 2013-04-03 – 2013-04-17 (×15): 15 mL via OROMUCOSAL
  Filled 2013-04-03 (×19): qty 15

## 2013-04-03 MED ORDER — CLINDAMYCIN PHOSPHATE 1 % EX SOLN
1.0000 | Freq: Two times a day (BID) | CUTANEOUS | Status: DC
Start: 2013-04-03 — End: 2013-04-17
  Administered 2013-04-03 – 2013-04-16 (×8): 1 via TOPICAL
  Filled 2013-04-03 (×2): qty 30

## 2013-04-03 MED ORDER — ONDANSETRON HCL 4 MG/2ML IJ SOLN
4.0000 mg | Freq: Four times a day (QID) | INTRAMUSCULAR | Status: DC | PRN
  Administered 2013-04-04 – 2013-04-13 (×2): 4 mg via INTRAVENOUS
  Filled 2013-04-03 (×2): qty 2

## 2013-04-03 MED ORDER — INSULIN ASPART 100 UNIT/ML ~~LOC~~ SOLN
0.0000 [IU] | Freq: Four times a day (QID) | SUBCUTANEOUS | Status: DC
  Administered 2013-04-03 – 2013-04-04 (×2): 2 [IU] via SUBCUTANEOUS
  Administered 2013-04-04: 1 [IU] via SUBCUTANEOUS
  Administered 2013-04-04 – 2013-04-05 (×4): 2 [IU] via SUBCUTANEOUS

## 2013-04-03 MED ORDER — POTASSIUM CHLORIDE 10 MEQ/100ML IV SOLN
10.0000 meq | INTRAVENOUS | Status: AC
  Administered 2013-04-03 – 2013-04-04 (×6): 10 meq via INTRAVENOUS
  Filled 2013-04-03: qty 600

## 2013-04-03 MED ORDER — HYDROCODONE-ACETAMINOPHEN 5-325 MG PO TABS
1.0000 | ORAL_TABLET | ORAL | Status: DC | PRN
  Administered 2013-04-12 – 2013-04-14 (×4): 1 via ORAL
  Filled 2013-04-03 (×4): qty 1

## 2013-04-03 MED ORDER — BIOTENE DRY MOUTH MT LIQD
15.0000 mL | Freq: Two times a day (BID) | OROMUCOSAL | Status: DC
  Administered 2013-04-03 – 2013-04-15 (×18): 15 mL via OROMUCOSAL

## 2013-04-03 MED ORDER — ONDANSETRON HCL 4 MG PO TABS
4.0000 mg | ORAL_TABLET | Freq: Four times a day (QID) | ORAL | Status: DC | PRN
  Filled 2013-04-03: qty 1

## 2013-04-03 MED ORDER — ACETAMINOPHEN 650 MG RE SUPP: 650.0000 mg | Freq: Four times a day (QID) | RECTAL | Status: DC | PRN

## 2013-04-03 MED ORDER — TRACE MINERALS CR-CU-F-FE-I-MN-MO-SE-ZN IV SOLN
INTRAVENOUS | Status: DC
  Filled 2013-04-03: qty 2000

## 2013-04-03 MED ORDER — SUCRALFATE 1 GM/10ML PO SUSP
1.0000 g | Freq: Four times a day (QID) | ORAL | Status: DC
  Administered 2013-04-03 – 2013-04-04 (×8): 1 g via ORAL
  Filled 2013-04-03 (×12): qty 10

## 2013-04-03 MED ORDER — NITROGLYCERIN 0.4 MG SL SUBL
0.4000 mg | SUBLINGUAL_TABLET | SUBLINGUAL | Status: DC | PRN
Start: 2013-04-03 — End: 2013-04-17

## 2013-04-03 MED ORDER — ONDANSETRON HCL 4 MG/2ML IJ SOLN
4.0000 mg | Freq: Once | INTRAMUSCULAR | Status: AC
  Administered 2013-04-03: 4 mg via INTRAVENOUS
  Filled 2013-04-03: qty 2

## 2013-04-03 MED ORDER — VITAMIN C 500 MG PO TABS
500.0000 mg | ORAL_TABLET | Freq: Every morning | ORAL | Status: DC
  Administered 2013-04-03 – 2013-04-17 (×15): 500 mg via ORAL
  Filled 2013-04-03 (×15): qty 1

## 2013-04-03 MED ORDER — PREGABALIN 50 MG PO CAPS
50.0000 mg | ORAL_CAPSULE | Freq: Three times a day (TID) | ORAL | Status: DC
  Administered 2013-04-03 – 2013-04-09 (×19): 50 mg via ORAL
  Filled 2013-04-03 (×19): qty 1

## 2013-04-03 MED ORDER — ACETAMINOPHEN 325 MG PO TABS: 650.0000 mg | ORAL_TABLET | Freq: Four times a day (QID) | ORAL | Status: DC | PRN

## 2013-04-03 MED ORDER — MINOCYCLINE HCL 100 MG PO CAPS
100.0000 mg | ORAL_CAPSULE | Freq: Two times a day (BID) | ORAL | Status: DC
  Administered 2013-04-03 – 2013-04-04 (×4): 100 mg via ORAL
  Filled 2013-04-03 (×6): qty 1

## 2013-04-03 NOTE — Consult Note (Signed)
WOC ostomy consult  Stoma type/location: Midline fistula and RUQ ileostomy Stomal assessment/size: midline fistula opening is 1.5cm x 1cm, Ileostomy is 1cm x 2cm. Both are located in a crease (ileostomy) or deep crater (fistula) Peristomal assessment: intact, clear Treatment options for stomal/peristomal skin: none indicated.  Placement of a skin barrier ring is required at both sites prior to placement of the Eakin fistula pouch (to the fistula) and the 1-piece ostomy pouch (to the ileostomy) Output: mucus from both Ostomy pouching: 1pc ostomy pouch over ileostomy [place] [phone]) and Eakin (small) fistula pouch [place] # [phone]) over the fistula. Change a minimum of once weekly and PRN.  WOC Team will not follow but will remain available to this patient, his medical team and nursing staff  Throughout his stay. Please reconsult if needed. Thanks, Levester Fresh, MSN, RN, GNP, CWOCN, CWON-AP

## 2013-04-03 NOTE — Progress Notes (Signed)
INITIAL NUTRITION ASSESSMENT  DOCUMENTATION CODES Per approved criteria  -Not Applicable   INTERVENTION: TPN per PharmD Monitor daily weights PO liquids per MD discretion  NUTRITION DIAGNOSIS: Inadequate oral intake related to inability to eat as evidenced by pt with chronic TPN and chronic PEG for suction.   Goal: Pt to meet >/= 90% of their estimated nutrition needs  Monitor:  TPN initiation and rate Wt Labs  Reason for Assessment: MST  57 y.o. male  Admitting Dx: Dehydration  ASSESSMENT: 57 y.o. 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 [name] which has left him with multiple drains and fistulas, on chronic TPN via a PICC line, chronic PEG for gastric decompression, history of small bowel obstruction status post jejunal stent, ileostomy, cholecystitis status post cholecystostomy and chronic indwelling drain who was just discharged from [facility] yesterday and was transferred to his nursing home in [place] started developing nausea and vomiting and started feeling weak. In the ER patient was found to be hypotensive with elevated lactic acid levels.  Wt history shows 34 lbs wt loss in the past 3 months but, reports that he has been maintaining his weight between 170 and 180 lbs most recently. Pt reports that he has been receiving TPN for 16 hrs per day and that he drinks fluid by mouth (just for taste) which is then suctioned out via PEG. Pt reports that he has not eaten any food by mouth since September 2013. Pt reports that he would like to be able to continue drinking fluids (i.e ice cream, popsicles, water, juice) by mouth.  Height: Ht Readings from Last 1 Encounters:  04/03/13 6\' 4"  (1.93 m)    Weight: Wt Readings from Last 1 Encounters:  04/03/13 170 lb 6.7 oz (77.3 kg)    Ideal Body Weight: 202 lbs  % Ideal Body Weight:  84%  Wt Readings from Last 10 Encounters:  04/03/13 170 lb 6.7 oz (77.3 kg)  01/19/13 186 lb 4.6 oz (84.5 kg)  12/30/12 204 lb (92.534 kg)  12/30/12 204 lb (92.534 kg)  12/11/12 204 lb (92.534 kg)  12/11/12 204 lb (92.534 kg)  12/11/12 204 lb (92.534 kg)  12/11/12 204 lb (92.534 kg)  12/11/12 204 lb (92.534 kg)  12/06/11 246 lb 4.8 oz (111.721 kg)    Usual Body Weight: 180 lbs  % Usual Body Weight: 94%  BMI:  Body mass index is 20.75 kg/(m^2).  Estimated Nutritional Needs: Kcal: 2220-2565 Protein: 100-132 grams Fluid: 2.3-2.6 L  Nutrition Focused Physical Exam:  Subcutaneous Fat:  Orbital Region: wnl Upper Arm Region: wnl Thoracic and Lumbar Region: NA  Muscle:  Temple Region: mild wasting Clavicle Bone Region: wnl Clavicle and Acromion Bone Region: wnl Scapular Bone Region: NA Dorsal Hand: mild wasting Patellar Region: wnl Anterior Thigh Region: wnl Posterior Calf Region: wnl  Edema: present Skin: +1 RLE and LLE edema  Diet Order: none  EDUCATION NEEDS: -No education needs identified at this time   Intake/Output Summary (Last 24 hours) at 04/03/13 1242 Last data filed at 04/03/13 0800  Gross per 24 hour  Intake   2250 ml  Output    350 ml  Net   1900 ml    Last BM: 4/17  Labs:   Recent Labs Lab 04/03/13 0230 04/03/13 0600  NA 141 147*  K 4.0 2.9*  CL 86* 94*  CO2 44* 44*  BUN 76* 68*  CREATININE 1.38* 1.21  CALCIUM 10.4 8.9  MG  --  2.2  GLUCOSE 98 129*    CBG (last 3)   Recent Labs  04/03/13 0522 04/03/13 1124  GLUCAP 106* 143*    Scheduled Meds: . antiseptic oral rinse  15 mL Mouth Rinse q12n4p  . chlorhexidine  15 mL Mouth Rinse BID  . clindamycin  1 application Topical BID  . enoxaparin (LOVENOX) injection  40 mg Subcutaneous Q24H  . fludrocortisone  0.1 mg Oral BID  . insulin aspart  0-9 Units Subcutaneous Q6H  . midodrine  10 mg Oral TID  . minocycline  100 mg Oral BID  . pantoprazole  40 mg Oral q morning - 10a   . potassium chloride  10 mEq Intravenous Q1 Hr x 3  . pregabalin  50 mg Oral TID  . sodium chloride  3 mL Intravenous Q12H  . sucralfate  1 g Oral QID  . vitamin C  500 mg Oral q morning - 10a  . zinc sulfate  220 mg Oral q morning - 10a    Continuous Infusions: . sodium chloride 125 mL/hr at 04/03/13 0516    Past Medical History  Diagnosis Date  . Hypertension   . GERD (gastroesophageal reflux disease)   . Small bowel obstruction 2013  . 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 Acute cholecystitis s/p perc draianage [id number] 12/20/2012  . Anemia of chronic disease 12/19/2012  . 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 MRSA bacteremia 01/04/2013  . SBO (small bowel obstruction) 11/07/2011  . Septic shock due to Staphylococcus aureus 12/24/2012  . Severe protein-calorie malnutrition 01/17/2013    Past Surgical History  Procedure Laterality Date  . Tonsillectomy    . Esophagogastroduodenoscopy  12/11/2012    Procedure: ESOPHAGOGASTRODUODENOSCOPY (EGD);  Surgeon: Louis Meckel, MD;  Location[facility] ENDOSCOPY;  Service: Endoscopy;  Laterality: N/A;  . Duodenal stent placement  12/11/2012    Procedure: DUODENAL STENT PLACEMENT;  Surgeon: Louis Meckel, MD;  Location: [facility] ENDOSCOPY;  Service: Endoscopy;  Laterality: N/A;  . Esophagogastroduodenoscopy  12/13/2012    Procedure: ESOPHAGOGASTRODUODENOSCOPY (EGD);  Surgeon: Louis Meckel, MD;  Location: [facility] OR;  Service: Endoscopy;  Laterality: N/A;  with attempt to stent duodenal stricture  . Peg placement  12/12/2012    Procedure: PERCUTANEOUS ENDOSCOPIC GASTROSTOMY (PEG) PLACEMENT;  Surgeon: Louis Meckel, MD;  Location: [facility] ENDOSCOPY;  Service: Endoscopy;  Laterality: N/A;  . Small intestine surgery  jan 2013    [facility].  SB resection, ileostomy, gastrostomy  . Debulking  sep 2013    [facility].  ileal SB resection, debulking of peritoneal carcinomatosis,  intraperitoneal chemotherapy  . Colon surgery  oct 2013    [facility] transverse colon perforation  . Enteroscopy  12/30/2012    Procedure: ENTEROSCOPY;  Surgeon: Louis Meckel, MD;  Location: [facility] ENDOSCOPY;  Service: Endoscopy;  Laterality: N/A;  With possible stent placement; please have duodenal stent available  . Duodenal stent placement  12/30/2012    Procedure: DUODENAL STENT PLACEMENT;  Surgeon: Louis Meckel, MD;  Location: [facility] ENDOSCOPY;  Service: Endoscopy;  Laterality: N/A;    Ian Malkin RD, LDN Inpatient Clinical Dietitian Pager: [phone] After Hours Pager: [phone]

## 2013-04-03 NOTE — ED Provider Notes (Signed)
History     CSN: [id number]  Arrival date & time 04/03/13  0211   First MD Initiated Contact with Patient 04/03/13 [phone]      Chief Complaint  Patient presents with  . Weakness    (Consider location/radiation/quality/duration/timing/severity/associated sxs/prior treatment) HPI Comments: 57 year old male the past medical history of perineal carcinomatosis, abdominal malignant neoplasm, colo-enteric fistula and anemia him on multiple other medical problems presents to the emergency department from [facility] health care complaining of increasing weakness and decreased urine output beginning later in the night last night.  States his G-tube needs to be pumped because it is making him nauseous. Admits to associated lightheadedness and "darkness" in his vision. Admits to decreased urine output, urine appears dark. Denies dysuria, Hematuria, increased urinary frequency or urgency .Last chemotherapy treatment was about a week and a half ago- receives chemo from [facility]. Denies fever, chills, chest pain or shortness of breath.  Patient is a 57 y.o. male presenting with weakness. The history is provided by the patient.  Weakness Associated symptoms include nausea and weakness. Pertinent negatives include no abdominal pain, chest pain, chills, fever or vomiting.    Past Medical History  Diagnosis Date  . Hypertension   . GERD (gastroesophageal reflux disease)   . Small bowel obstruction 2013  . 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 Acute cholecystitis s/p perc draianage [id number] 12/20/2012  . Anemia of chronic disease 12/19/2012  . 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 MRSA bacteremia 01/04/2013  . SBO (small bowel obstruction) 11/07/2011  . Septic shock due to Staphylococcus aureus 12/24/2012  . Severe protein-calorie malnutrition 01/17/2013    Past Surgical History  Procedure Laterality Date  .  Tonsillectomy    . Esophagogastroduodenoscopy  12/11/2012    Procedure: ESOPHAGOGASTRODUODENOSCOPY (EGD);  Surgeon: Louis Meckel, MD;  Location: [facility] ENDOSCOPY;  Service: Endoscopy;  Laterality: N/A;  . Duodenal stent placement  12/11/2012    Procedure: DUODENAL STENT PLACEMENT;  Surgeon: Louis Meckel, MD;  Location: [facility] ENDOSCOPY;  Service: Endoscopy;  Laterality: N/A;  . Esophagogastroduodenoscopy  12/13/2012    Procedure: ESOPHAGOGASTRODUODENOSCOPY (EGD);  Surgeon: Louis Meckel, MD;  Location: [facility] OR;  Service: Endoscopy;  Laterality: N/A;  with attempt to stent duodenal stricture  . Peg placement  12/12/2012    Procedure: PERCUTANEOUS ENDOSCOPIC GASTROSTOMY (PEG) PLACEMENT;  Surgeon: Louis Meckel, MD;  Location: [facility] ENDOSCOPY;  Service: Endoscopy;  Laterality: N/A;  . Small intestine surgery  jan 2013    [facility].  SB resection, ileostomy, gastrostomy  . Debulking  sep 2013    [facility].  ileal SB resection, debulking of peritoneal carcinomatosis, intraperitoneal chemotherapy  . Colon surgery  oct 2013    [facility] transverse colon perforation  . Enteroscopy  12/30/2012    Procedure: ENTEROSCOPY;  Surgeon: Louis Meckel, MD;  Location: [facility] ENDOSCOPY;  Service: Endoscopy;  Laterality: N/A;  With possible stent placement; please have duodenal stent available  . Duodenal stent placement  12/30/2012    Procedure: DUODENAL STENT PLACEMENT;  Surgeon: Louis Meckel, MD;  Location: [facility] ENDOSCOPY;  Service: Endoscopy;  Laterality: N/A;    Family History  Problem Relation Age of Onset  . Lung cancer Brother   . Hypertension Mother   . Hypertension Father   . Heart failure Father     History  Substance Use Topics  . Smoking status: Never Smoker   .  Smokeless tobacco: Never Used  . Alcohol Use: No     Comment: Stopped in July      Review of Systems  Constitutional: Negative for fever and chills.  Respiratory: Negative for shortness of breath.   Cardiovascular: Negative for chest  pain.  Gastrointestinal: Positive for nausea. Negative for vomiting and abdominal pain.  Musculoskeletal: Negative for back pain.  Neurological: Positive for weakness.  All other systems reviewed and are negative.    Allergies  Percocet  Home Medications   Current Outpatient Rx  Name  Route  Sig  Dispense  Refill  . antiseptic oral rinse (BIOTENE) LIQD   Mouth Rinse   15 mLs by Mouth Rinse route 2 times daily at 12 noon and 4 pm.   1 Bottle   0   . chlorhexidine (PERIDEX) 0.12 % solution   Mouth/Throat   Use as directed 15 mLs in the mouth or throat 2 (two) times daily.   120 mL   0   . diclofenac sodium (VOLTAREN) 1 % GEL   Topical   Apply 2 g topically 2 (two) times daily as needed (knee pain).   1 g   0   . menthol-cetylpyridinium (CEPACOL) 3 MG lozenge   Oral   Take 1 lozenge by mouth every 2 (two) hours as needed. Sore throat         . methylPREDNISolone sodium succinate (SOLU-MEDROL) 40 mg/mL injection   Intravenous   Inject 1 mL (40 mg total) into the vein daily.   1 each   0   . morphine 4 MG/ML injection   Intravenous   Inject 1 mL (4 mg total) into the vein every 3 (three) hours as needed for severe pain.   1 mL   0   . Morphine Sulfate (MORPHINE CONCENTRATE) 10 mg / 0.5 ml concentrated solution   Oral   Take 5 mg by mouth every hour as needed. Pain         . nitroGLYCERIN (NITROSTAT) 0.4 MG SL tablet   Sublingual   Place 0.4 mg under the tongue every 5 (five) minutes as needed. For chest pain, Hold for SBP<100         . ondansetron (ZOFRAN) 4 MG tablet   Oral   Take 4 mg by mouth every 8 (eight) hours as needed. Nausea         . ondansetron (ZOFRAN) 4 MG/2ML SOLN injection   Intravenous   Inject 2 mLs (4 mg total) into the vein every 6 (six) hours as needed for nausea.   2 mL   0   . pantoprazole (PROTONIX) 40 MG injection   Intravenous   Inject 40 mg into the vein every 12 (twelve) hours.         . Parenteral Electrolytes  (TPN ELECTROLYTES FTV IV)   Intravenous   Inject 3,000 mLs into the vein daily. 10ml addamel, 10ml MVI, 30 units insulin         . pregabalin (LYRICA) 25 MG capsule   Oral   Take 25 mg by mouth 2 (two) times daily.         . vancomycin (VANCOCIN) 1 GM/200ML SOLN   Intravenous   Inject 200 mLs (1,000 mg total) into the vein daily.   4000 mL   0     BP 70/40  Resp 32  SpO2 100%  Physical Exam  Nursing note and vitals reviewed. Constitutional: He is oriented to person, place, and time. He appears  cachectic. No distress.  Appears fatigued.  HENT:  Head: Normocephalic and atraumatic.  Mouth/Throat: Mucous membranes are dry.  Eyes: Conjunctivae and EOM are normal. Pupils are equal, round, and reactive to light.  Neck: Normal range of motion. Neck supple.  Cardiovascular: Regular rhythm, normal heart sounds and intact distal pulses.  Tachycardia present.   Pulmonary/Chest: Breath sounds normal. Tachypnea noted. He has no decreased breath sounds. He has no wheezes. He has no rhonchi. He has no rales.  Abdominal: Bowel sounds are normal. He exhibits no distension. There is no tenderness. There is no rigidity, no rebound and no guarding.    Neurological: He is alert and oriented to person, place, and time. GCS eye subscore is 4. GCS verbal subscore is 5. GCS motor subscore is 6.  Skin: Skin is warm and dry.  Psychiatric: He has a normal mood and affect. His behavior is normal.    ED Course  Procedures (including critical care time)  Labs Reviewed  COMPREHENSIVE METABOLIC PANEL - Abnormal; Notable for the following:    Chloride 86 (*)    CO2 44 (*)    BUN 76 (*)    Creatinine, Ser 1.38 (*)    Albumin 2.7 (*)    AST 54 (*)    ALT 70 (*)    Alkaline Phosphatase 401 (*)    GFR calc non Af Amer 56 (*)    GFR calc Af Amer 65 (*)    All other components within normal limits  CBC WITH DIFFERENTIAL - Abnormal; Notable for the following:    RDW 18.1 (*)    Basophils Relative  2 (*)    All other components within normal limits  LACTIC ACID, PLASMA - Abnormal; Notable for the following:    Lactic Acid, Venous 3.7 (*)    All other components within normal limits  CULTURE, BLOOD (ROUTINE X 2)  CULTURE, BLOOD (ROUTINE X 2)  URINE CULTURE  URINALYSIS, ROUTINE W REFLEX MICROSCOPIC  TROPONIN I   Dg Chest Port 1 View  04/03/2013  *RADIOLOGY REPORT*  Clinical Data: Weakness and shortness of breath.  PORTABLE CHEST - 1 VIEW  Comparison: Chest radiograph performed 01/17/2013  Findings: The patient's right PICC is noted ending about the mid SVC.  The lungs are well-aerated and clear.  There is no evidence of focal opacification, pleural effusion or pneumothorax.  The cardiomediastinal silhouette is within normal limits.  No acute osseous abnormalities are seen.  IMPRESSION: No acute cardiopulmonary process seen.   Original Report Authenticated By: Tonia Ghent, M.D.      1. Weakness   2. Hypotension   3. Dehydration       MDM  Weakness, hypotension, dehydration. Tachycardic and tachypneic. Labs including lactic acid and blood cultures obtained. Lactic acid 3.7. CO2 44. CXR without any acute abnormality. Awaiting U/A. He is afebrile. Appears weak. BP increased to 85/61 from 66/52 after 2L fluid. G-tube drained. Nausea improved. Admission most appropriate. Case discussed with Dr. Patria Mane who agrees with plan of care. Admission accepted by Dr. Toniann Fail, [facility] team 8.   Trevor Mace, PA-C 04/03/13 [phone]

## 2013-04-03 NOTE — Progress Notes (Signed)
Patient requesting ice cream.  Per orders, patient is allowed ice chips and popsicles.  Patient states he "drinks shakes at home, and will continue doing that here".  Informed patient of diet orders, he says he "will have ice cream or a shake if he wants one".  Patient educated on possibility of additional nausea and vomiting with eating the ice cream.  Verbalizes understanding and continues to request ice cream.  Ice cream given to patient.  Will continue to monitor.

## 2013-04-03 NOTE — ED Notes (Signed)
ZOX:[id number]<AV> Expected date:<BR> Expected time:<BR> Means of arrival:<BR> Comments:<BR> EMS/56 yo male with multiple medical problems-weakness/minimal urine output

## 2013-04-03 NOTE — Progress Notes (Addendum)
PARENTERAL NUTRITION CONSULT NOTE - INITIAL  Pharmacy Consult for TPN Indication: enterocutaneous fistula, abdominal carcinomatosis  Allergies  Allergen Reactions  . Percocet (Oxycodone-Acetaminophen) Nausea And Vomiting    hallucination    Patient Measurements: Height: 6\' 4"  (193 cm) Weight: 170 lb 6.7 oz (77.3 kg) IBW/kg (Calculated) : 86.8 Usual Weight: 170-180 lbs  Vital Signs: Temp: 97.5 F (36.4 C) (04/18 0800) Temp src: Oral (04/18 0800) BP: 105/84 mmHg (04/18 1100) Pulse Rate: 93 (04/18 1100) Intake/Output from previous day: 04/17 0701 - 04/18 0700 In: 2250 [I.V.:2250] Out: -  Intake/Output from this shift: Total I/O In: -  Out: 350 [Urine:350]  Labs:  Recent Labs  04/03/13 0230 04/03/13 0600  WBC 4.1 2.3*  HGB 16.9 9.6*  HCT 49.4 29.7*  PLT 263 274     Recent Labs  04/03/13 0230 04/03/13 0600  NA 141 147*  K 4.0 2.9*  CL 86* 94*  CO2 44* 44*  GLUCOSE 98 129*  BUN 76* 68*  CREATININE 1.38* 1.21  CALCIUM 10.4 8.9  MG  --  2.2  PROT 7.9 6.7  ALBUMIN 2.7* 2.5*  AST 54* 42*  ALT 70* 58*  ALKPHOS 401* 335*  BILITOT 0.6 0.6   Estimated Creatinine Clearance: 74.5 ml/min (by C-G formula based on Cr of 1.21).    Recent Labs  04/03/13 0522 04/03/13 1124  GLUCAP 106* 143*    Medical History: Past Medical History  Diagnosis Date  . Hypertension   . GERD (gastroesophageal reflux disease)   . Small bowel obstruction 2013  . 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 Acute cholecystitis s/p perc draianage [id number] 12/20/2012  . Anemia of chronic disease 12/19/2012  . 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 MRSA bacteremia 01/04/2013  . SBO (small bowel obstruction) 11/07/2011  . Septic shock due to Staphylococcus aureus 12/24/2012  . Severe protein-calorie malnutrition 01/17/2013     Insulin Requirements in the past 24 hours:  None ordered  Current  Nutrition:  PTA TPN containing 160g protein and 368g dextrose with 41 units of regular insulin cycled over 18 hours with 20% fat emulsion 250 ml given over 12 hours for 2341 kcal/day total.  Nutritional Goals:  Will await RD assessment, but in order to obtain protein and kcal goals that were achieved PTA with our in-house premixed TNAs, would require Clinimix E 5/15 at 130 ml/hr to provide 156g protein and weekly average of 2420 kcal/day with lipids MWF.  This is >3L of fluid.   Assessment: 71 yoM with complicated PMH of metastatic adenocarcinoma, peritoneal carcinomatosis s/p extensive debulking procedures and abdominal surgeries which has left him with multiple drains and fistulas on chronic TPN via a PICC line.  Discharged from [facility] yesterday and was transferred to his nursing home in [place] started developing N/V and weakness.  Was receiving TPN cycled over 18 hours at [facility].  Will continue TPN inpatient but will be difficult to provide large protein and kcal amounts with premixed Clinimix TNAs.   Glucose: 106-143  Electrolytes:  K+ low at 2.9, now s/p 3 runs of KCl.  Will provide an additional 3 runs prior to initiating TNA.  Mag WNL.  Phos high -f/u repeat level in AM.  LFTs: AST/ALT 42/58; Alk Phos 335 but decreased from admission  TGs: ordered  Prealbumin: ordered  Renal: SCr 1.2 (looks near baseline); will follow closely considering high protein administration  in TNA.  Plan:  At 1800 today:  Initiate Clinimix E5/15 at 83 ml/hr.  Should be able to initiate at high rate considering that patient was tolerating high rates PTA (cycling at 135 ml/hr PTA).  Will provide TNA continuously for now due to high volume to be provided with pre-mixed TNA.  Will f/u RD's assessment and nutritional goals and adjust/advance TNA as needed.  Add regular insulin to TNA at 10 units/L.  Starting conservatively considering providing less dextrose kcal than outpatient TNA.  Add sensitive  scale SSI q6h.  Fat emulsion at 10 ml/hr (MWF only due to ongoing shortage).  TNA to contain standard multivitamins and trace elements (MWF only due to ongoing shortage).  KCl 10 mEq/100 mL x 3 runs.  Reduce IVF to 40 ml/hr.  TNA lab panels on Mondays & Thursdays.  F/u daily.   Clance Boll 04/03/2013,11:49 AM  Addendum: 04/03/2013 RD has seen patient and recommendation for estimated nutritional needs are: Kcal: 2220-2565 Protein: 100-132 grams Fluid: 2.3-2.6 L  Can achieve these nutrition goals with Clinimix E5/20 at a goal rate of 100 ml/hr + IVFE 20% at 10 ml/hr on MWF to provide: 120g/day protein, 2318 Kcal/day avg.(2112 Kcal/day STTHS, 2592 Kcal/day MWF).  Plan:  At 1800, will start with Clinimix E5/20 (instead of Clinimix E5/15) at 83 ml/hr (starting with 2L Clinimix bag total) then will advance to goal rate tomorrow if tolerated.  Will contain regular insulin 10 units/L.  Continue lipids as ordered above.  Clance Boll, PharmD, BCPS Pager: [phone] 04/03/2013 1:28 PM

## 2013-04-03 NOTE — ED Provider Notes (Signed)
Medical screening examination/treatment/procedure(s) were performed by non-physician practitioner and as supervising physician I was immediately available for consultation/collaboration.  Lyanne Co, MD 04/03/13 [phone]

## 2013-04-03 NOTE — ED Notes (Signed)
Pt is from [facility]. C/o increasing weakness and decreased urine output tonight. Tachycardic on the monitor. Pt has PICC line in place. 137/98 pressure.

## 2013-04-03 NOTE — Progress Notes (Signed)
[id number] Earlene Plater, RN, BSN, CCM:  CHART REVIEWED AND UPDATED.  Next chart review due on [id number]. NO DISCHARGE NEEDS PRESENT AT THIS TIME. CASE MANAGEMENT [phone]

## 2013-04-03 NOTE — ED Notes (Signed)
Admitting MD at bedside.

## 2013-04-03 NOTE — Progress Notes (Addendum)
CRITICAL VALUE ALERT  Critical value received:  CO2 44, K+ 2.9  Date of notification:  04-03-13  Time of notification: 0735   Critical value read back:yes  Nurse who received alert:  Royal Hawthorn, RN   MD notified (1st page):  Dr. Blake Divine  Time of first page:  0740  MD notified (2nd page):  Time of second page:  Responding MD:  Dr. Blake Divine  Time MD responded:  [phone]

## 2013-04-03 NOTE — Progress Notes (Signed)
[facility] PROGRESS NOTE  [name] ZOX:[id number] DOB: 08/04/1956 DOA: 04/03/2013 PCP: [name], MD  Assessment/Plan: 1. Hypotension from dehydration secondary to nausea and vomiting - continue with IV fluids. Recheck labs including lactic acid metabolic panel and CBC. Patient presently is afebrile and does not have any signs of sepsis. 2. Acute renal failure secondary dehydration - closely follow intake output and metabolic panel. 3. Abdominal carcinomatosis - Treatment per oncologist. Patient presently follows at [facility]. 4. On TPN - pharmacy to assist. 5. History of gout - presently not having any flares. 6. Hypokalemia: replete as needed.   HPI/Subjective: Feeling better   Objective: Filed Vitals:   04/03/13 1100 04/03/13 1200 04/03/13 1300 04/03/13 1500  BP: 105/84 104/72 92/67 101/70  Pulse: 93 94 95 95  Temp:  97.3 F (36.3 C)    TempSrc:  Oral    Resp: 16 21 19 29   Height:      Weight:      SpO2: 100% 100% 99% 100%    Intake/Output Summary (Last 24 hours) at 04/03/13 1553 Last data filed at 04/03/13 1550  Gross per 24 hour  Intake 8399.17 ml  Output   1075 ml  Net 7324.17 ml   Filed Weights   04/03/13 0302 04/03/13 0500  Weight: 81.647 kg (180 lb) 77.3 kg (170 lb 6.7 oz)    Exam:  General: Well-developed and moderately nourished.  Cardiovascular: S1-S2 heard tachycardic.  Respiratory: No rhonchi or crepitations.  Abdomen: colostomy pouch and suction tube in place.  Skin: No new rash.  Musculoskeletal: No edema.   Neurologic: Moves all extremities.   Data Reviewed: Basic Metabolic Panel:  Recent Labs Lab 04/03/13 0230 04/03/13 0600  NA 141 147*  K 4.0 2.9*  CL 86* 94*  CO2 44* 44*  GLUCOSE 98 129*  BUN 76* 68*  CREATININE 1.38* 1.21  CALCIUM 10.4 8.9  MG  --  2.2  PHOS  --  5.5*   Liver Function Tests:  Recent Labs Lab 04/03/13 0230 04/03/13 0600  AST 54* 42*  ALT 70* 58*  ALKPHOS 401* 335*   BILITOT 0.6 0.6  PROT 7.9 6.7  ALBUMIN 2.7* 2.5*   No results found for this basename: LIPASE, AMYLASE,  in the last 168 hours No results found for this basename: AMMONIA,  in the last 168 hours CBC:  Recent Labs Lab 04/03/13 0230 04/03/13 0600  WBC 4.1 2.3*  NEUTROABS 2.1 1.3*  HGB 16.9 9.6*  HCT 49.4 29.7*  MCV 95.7 97.7  PLT 263 274   Cardiac Enzymes:  Recent Labs Lab 04/03/13 0230  TROPONINI <0.30   BNP (last 3 results)  Recent Labs  12/22/12 1700  PROBNP 33.8   CBG:  Recent Labs Lab 04/03/13 0522 04/03/13 1124  GLUCAP 106* 143*    Recent Results (from the past 240 hour(s))  MRSA PCR SCREENING     Status: Abnormal   Collection Time    04/03/13  5:00 AM      Result Value Range Status   MRSA by PCR POSITIVE (*) NEGATIVE Final   Comment:            The GeneXpert MRSA Assay (FDA     approved for NASAL specimens     only), is one component of a     comprehensive MRSA colonization     surveillance program. It is not     intended to diagnose MRSA     infection nor to guide or  monitor treatment for     MRSA infections.     RESULT CALLED TO, READ BACK BY AND VERIFIED WITH:     C.CAGLE AT 0743 ON [id number] BY C.BONGEL     Studies: Dg Chest [facility] 1 View  04/03/2013  *RADIOLOGY REPORT*  Clinical Data: Weakness and shortness of breath.  PORTABLE CHEST - 1 VIEW  Comparison: Chest radiograph performed 01/17/2013  Findings: The patient's right PICC is noted ending about the mid SVC.  The lungs are well-aerated and clear.  There is no evidence of focal opacification, pleural effusion or pneumothorax.  The cardiomediastinal silhouette is within normal limits.  No acute osseous abnormalities are seen.  IMPRESSION: No acute cardiopulmonary process seen.   Original Report Authenticated By: Tonia Ghent, M.D.     Scheduled Meds: . antiseptic oral rinse  15 mL Mouth Rinse q12n4p  . chlorhexidine  15 mL Mouth Rinse BID  . clindamycin  1 application Topical BID   . enoxaparin (LOVENOX) injection  40 mg Subcutaneous Q24H  . fludrocortisone  0.1 mg Oral BID  . insulin aspart  0-9 Units Subcutaneous Q6H  . midodrine  10 mg Oral TID  . minocycline  100 mg Oral BID  . pantoprazole  40 mg Oral q morning - 10a  . potassium chloride  10 mEq Intravenous Q1 Hr x 3  . pregabalin  50 mg Oral TID  . sodium chloride  3 mL Intravenous Q12H  . sucralfate  1 g Oral QID  . vitamin C  500 mg Oral q morning - 10a  . zinc sulfate  220 mg Oral q morning - 10a   Continuous Infusions: . Marland KitchenTPN (CLINIMIX-E) Adult    . sodium chloride 125 mL/hr at 04/03/13 1338  . sodium chloride    . fat emulsion      Principal Problem:   Dehydration secondary to nausea and vomiting Active Problems:   Abdominal malignant neoplasm   Acute kidney injury secondary to dehydration        [facility]  [facility] Pager [phone]. If 7PM-7AM, please contact night-coverage at [email or web address], password [facility] 04/03/2013, 3:53 PM  LOS: 0 days

## 2013-04-03 NOTE — H&P (Signed)
[facility] History and Physical  [name]:[id number] DOB: 1956-07-01 DOA: 04/03/2013  Referring physician: ER physician. PCP: [name], MD  Specialists: Oncologist at [facility].  Chief Complaint: Feeling weak. Nausea vomiting.  HPI: [name] is a 57 y.o. 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 [name] which has left him with multiple drains and fistulas, on chronic TPN via a PICC line, chronic PEG for gastric decompression, history of small bowel obstruction status post jejunal stent, ileostomy, cholecystitis status post cholecystostomy and chronic indwelling drain who was just discharged from [facility] yesterday and was transferred to his nursing home in [place] started developing nausea and vomiting and started feeling weak. In the ER patient was found to be hypotensive with elevated lactic acid levels. Patient was given 2 L normal saline bolus and his blood pressure is improving at this time. Patient denies any fever chills. Patient will be admitted for dehydration secondary to nausea and vomiting. Patient states that his G-tube was not connected to the suction and this caused nausea and vomiting and he eventually got dehydrated and felt weak. Presently on my exam patient feels better. Denies any chest pain or shortness of breath. Patient is afebrile and does not have any signs of sepsis. Patient was originally on hospice care but in February 2 months ago went to [facility] and got additional chemotherapy and was there until yesterday since February. Presently patient is receiving chemotherapy every 10 days since February. His CBC did not show any pancytopenia.   Review of Systems: As presented in the history of presenting illness, rest negative.  Past Medical History  Diagnosis Date  .  Hypertension   . GERD (gastroesophageal reflux disease)   . Small bowel obstruction 2013  . 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 Acute cholecystitis s/p perc draianage [id number] 12/20/2012  . Anemia of chronic disease 12/19/2012  . 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 MRSA bacteremia 01/04/2013  . SBO (small bowel obstruction) 11/07/2011  . Septic shock due to Staphylococcus aureus 12/24/2012  . Severe protein-calorie malnutrition 01/17/2013   Past Surgical History  Procedure Laterality Date  . Tonsillectomy    . Esophagogastroduodenoscopy  12/11/2012    Procedure: ESOPHAGOGASTRODUODENOSCOPY (EGD);  Surgeon: Louis Meckel, MD;  Location: [facility] ENDOSCOPY;  Service: Endoscopy;  Laterality: N/A;  . Duodenal stent placement  12/11/2012    Procedure: DUODENAL STENT PLACEMENT;  Surgeon: Louis Meckel, MD;  Location: [facility] ENDOSCOPY;  Service: Endoscopy;  Laterality: N/A;  . Esophagogastroduodenoscopy  12/13/2012    Procedure: ESOPHAGOGASTRODUODENOSCOPY (EGD);  Surgeon: Louis Meckel, MD;  Location: 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 Peg placement  12/12/2012    Procedure: PERCUTANEOUS ENDOSCOPIC GASTROSTOMY (PEG) PLACEMENT;  Surgeon: Louis Meckel, MD;  Location: [facility] ENDOSCOPY;  Service: Endoscopy;  Laterality: N/A;  . Small intestine surgery  jan 2013    [facility].  SB resection, ileostomy, gastrostomy  . Debulking  sep 2013    [facility].  ileal SB resection, debulking of peritoneal carcinomatosis, intraperitoneal chemotherapy  . Colon surgery  oct 2013    [facility] transverse colon perforation  . Enteroscopy  12/30/2012    Procedure: ENTEROSCOPY;  Surgeon: Louis Meckel, MD;  Location: [facility] ENDOSCOPY;  Service: Endoscopy;  Laterality: N/A;  With possible stent placement; please have duodenal stent available  . Duodenal stent placement  12/30/2012    Procedure:  DUODENAL STENT PLACEMENT;  Surgeon: Louis Meckel, MD;  Location: [facility] ENDOSCOPY;  Service: Endoscopy;  Laterality: N/A;   Social History:  reports that he has never smoked. He has never used smokeless tobacco. He reports that he does not drink alcohol or use illicit drugs. Lives at nursing home. where does patient live-- Cannot do ADLs. Can patient participate in ADLs?  Allergies  Allergen Reactions  . Percocet (Oxycodone-Acetaminophen) Nausea And Vomiting    hallucination    Family History  Problem Relation Age of Onset  . Lung cancer Brother   . Hypertension Mother   . Hypertension Father   . Heart failure Father       Prior to Admission medications   Medication Sig Start Date End Date Taking? Authorizing Provider  clindamycin (CLEOCIN T) 1 % lotion Apply 1 application topically 2 (two) times daily. Apply to chest and face   Yes Historical Provider, MD  enoxaparin (LOVENOX) 40 MG/0.4ML injection Inject 40 mg into the skin every morning.   Yes Historical Provider, MD  fludrocortisone (FLORINEF) 0.1 MG tablet Take 0.1 mg by mouth 2 (two) times daily.   Yes Historical Provider, MD  HYDROcodone-acetaminophen (NORCO/VICODIN) 5-325 MG per tablet Take 1 tablet by mouth every 4 (four) hours as needed for pain.   Yes Historical Provider, MD  midodrine (PROAMATINE) 10 MG tablet Take 10 mg by mouth 3 (three) times daily.   Yes Historical Provider, MD  minocycline (MINOCIN,DYNACIN) 100 MG capsule Take 100 mg by mouth 2 (two) times daily.   Yes Historical Provider, MD  pantoprazole (PROTONIX) 40 MG tablet Take 40 mg by mouth every morning.   Yes Historical Provider, MD  pregabalin (LYRICA) 50 MG capsule Take 50 mg by mouth 3 (three) times daily.   Yes Historical Provider, MD  promethazine (PHENERGAN) 12.5 MG tablet Take 12.5 mg by mouth every 6 (six) hours as needed for nausea.   Yes Historical Provider, MD  sucralfate (CARAFATE) 1 GM/10ML suspension Take 1 g by mouth 4 (four) times daily.   Yes  Historical Provider, MD  vitamin C (ASCORBIC ACID) 500 MG tablet Take 500 mg by mouth every morning.   Yes Historical Provider, MD  zinc sulfate 220 MG capsule Take 220 mg by mouth every morning.   Yes Historical Provider, MD  nitroGLYCERIN (NITROSTAT) 0.4 MG SL tablet Place 0.4 mg under the tongue every 5 (five) minutes as needed for chest pain. For chest pain, Hold for SBP<100    Historical Provider, MD   Physical Exam: Filed Vitals:   04/03/13 0324 04/03/13 0343 04/03/13 0345 04/03/13 0411  BP: 89/70 85/61  86/67  Pulse: 103 100    Temp:   98.9 F (37.2 C)   TempSrc:   Rectal   Resp: 23 17  16   Height:      Weight:      SpO2: 99% 98%       General:  Well-developed and moderately nourished.  Eyes: Anicteric no pallor.  ENT: No discharge from the ears eyes nose and mouth.  Neck: No mass felt.  Cardiovascular: S1-S2 heard tachycardic.  Respiratory: No rhonchi or crepitations.  Abdomen: PEG tube seen.  Skin: No new rash.  Musculoskeletal: No edema.  Psychiatric: Appears normal.  Neurologic: Moves all extremities.  Labs on Admission:  Basic Metabolic Panel:  Recent Labs Lab 04/03/13 0230  NA 141  K 4.0  CL 86*  CO2 44*  GLUCOSE 98  BUN 76*  CREATININE 1.38*  CALCIUM 10.4   Liver Function Tests:  Recent Labs Lab 04/03/13 0230  AST 54*  ALT 70*  ALKPHOS 401*  BILITOT 0.6  PROT 7.9  ALBUMIN 2.7*   No results found for this basename: LIPASE, AMYLASE,  in the last 168 hours No results found for this basename: AMMONIA,  in the last 168 hours CBC:  Recent Labs Lab 04/03/13 0230  WBC 4.1  NEUTROABS 2.1  HGB 16.9  HCT 49.4  MCV 95.7  PLT 263   Cardiac Enzymes:  Recent Labs Lab 04/03/13 0230  TROPONINI <0.30    BNP (last 3 results)  Recent Labs  12/22/12 1700  PROBNP 33.8   CBG: No results found for this basename: GLUCAP,  in the last 168 hours  Radiological Exams on Admission: Dg Chest Port 1 View  04/03/2013  *RADIOLOGY  REPORT*  Clinical Data: Weakness and shortness of breath.  PORTABLE CHEST - 1 VIEW  Comparison: Chest radiograph performed 01/17/2013  Findings: The patient's right PICC is noted ending about the mid SVC.  The lungs are well-aerated and clear.  There is no evidence of focal opacification, pleural effusion or pneumothorax.  The cardiomediastinal silhouette is within normal limits.  No acute osseous abnormalities are seen.  IMPRESSION: No acute cardiopulmonary process seen.   Original Report Authenticated By: Tonia Ghent, M.D.      Assessment/Plan Principal Problem:   Dehydration secondary to nausea and vomiting Active Problems:   Abdominal malignant neoplasm   Acute kidney injury secondary to dehydration   1. Hypotension from dehydration secondary to nausea and vomiting - continue with IV fluids. Recheck labs including lactic acid metabolic panel and CBC. Patient presently is afebrile and does not have any signs of sepsis. 2. Acute renal failure secondary dehydration - closely follow intake output and metabolic panel. 3. Abdominal carcinomatosis -  Treatment per oncologist. Patient presently follows at [facility]. 4. On TPN - pharmacy to assist. 5. History of gout - presently not having any flares.    Code Status: Full code.  Family Communication: None.  Disposition Plan: Admit to inpatient.    Haakon Titsworth N. [facility] Pager [phone].  If 7PM-7AM, please contact night-coverage [email or web address] Password [facility] 04/03/2013, 4:34 AM

## 2013-04-04 DIAGNOSIS — R5383 Other fatigue: Secondary | ICD-10-CM

## 2013-04-04 DIAGNOSIS — C762 Malignant neoplasm of abdomen: Secondary | ICD-10-CM

## 2013-04-04 DIAGNOSIS — R5381 Other malaise: Secondary | ICD-10-CM

## 2013-04-04 LAB — COMPREHENSIVE METABOLIC PANEL
BUN: 61 mg/dL — ABNORMAL HIGH (ref 6–23)
CO2: >45 mEq/L (ref 19–32)
Calcium: 9.8 mg/dL (ref 8.4–10.5)
Creatinine, Ser: 1.23 mg/dL (ref 0.50–1.35)
GFR calc Af Amer: 74 mL/min — ABNORMAL LOW (ref >90)
GFR calc non Af Amer: 64 mL/min — ABNORMAL LOW (ref >90)
Glucose, Bld: 170 mg/dL — ABNORMAL HIGH (ref 70–99)
Total Protein: 6.9 g/dL (ref 6.0–8.3)

## 2013-04-04 LAB — DIFFERENTIAL
Basophils Relative: 0 % (ref 0–1)
Eosinophils Absolute: 0 10*3/uL (ref 0.0–0.7)
Lymphs Abs: 0.9 10*3/uL (ref 0.7–4.0)
Monocytes Absolute: 0.4 10*3/uL (ref 0.1–1.0)
Neutrophils Relative %: 59 % (ref 43–77)

## 2013-04-04 LAB — BASIC METABOLIC PANEL
BUN: 67 mg/dL — ABNORMAL HIGH (ref 6–23)
CO2: >45 mEq/L (ref 19–32)
Calcium: 10.3 mg/dL (ref 8.4–10.5)
Creatinine, Ser: 1.64 mg/dL — ABNORMAL HIGH (ref 0.50–1.35)
GFR calc Af Amer: 52 mL/min — ABNORMAL LOW (ref >90)
GFR calc non Af Amer: 57 mL/min — ABNORMAL LOW (ref >90)
GFR calc non Af Amer: 45 mL/min — ABNORMAL LOW (ref >90)
Potassium: 2.6 mEq/L — CL (ref 3.5–5.1)
Sodium: 147 mEq/L — ABNORMAL HIGH (ref 135–145)

## 2013-04-04 LAB — CBC
MCH: 31.9 pg (ref 26.0–34.0)
MCHC: 32.4 g/dL (ref 30.0–36.0)
MCV: 98.7 fL (ref 78.0–100.0)
Platelets: 316 10*3/uL (ref 150–400)

## 2013-04-04 LAB — URINE CULTURE

## 2013-04-04 LAB — GLUCOSE, CAPILLARY
Glucose-Capillary: 163 mg/dL — ABNORMAL HIGH (ref 70–99)
Glucose-Capillary: 195 mg/dL — ABNORMAL HIGH (ref 70–99)
Glucose-Capillary: 158 mg/dL — ABNORMAL HIGH (ref 70–99)

## 2013-04-04 LAB — PHOSPHORUS: Phosphorus: 5.5 mg/dL — ABNORMAL HIGH (ref 2.3–4.6)

## 2013-04-04 LAB — CHOLESTEROL, TOTAL: Cholesterol: 181 mg/dL (ref 0–200)

## 2013-04-04 LAB — PREALBUMIN: Prealbumin: 33 mg/dL (ref 17.0–34.0)

## 2013-04-04 MED ORDER — CHLORHEXIDINE GLUCONATE CLOTH 2 % EX PADS
6.0000 | MEDICATED_PAD | Freq: Every day | CUTANEOUS | Status: AC
  Administered 2013-04-04 – 2013-04-08 (×5): 6 via TOPICAL

## 2013-04-04 MED ORDER — MUPIROCIN 2 % EX OINT
1.0000 "application " | TOPICAL_OINTMENT | Freq: Two times a day (BID) | CUTANEOUS | Status: AC
  Administered 2013-04-04 – 2013-04-08 (×10): 1 via NASAL
  Filled 2013-04-04: qty 22

## 2013-04-04 MED ORDER — SODIUM CHLORIDE 0.45 % IV SOLN
INTRAVENOUS | Status: DC
  Administered 2013-04-04 – 2013-04-05 (×2): via INTRAVENOUS
  Filled 2013-04-04 (×3): qty 1000

## 2013-04-04 MED ORDER — INSULIN REGULAR HUMAN 100 UNIT/ML IJ SOLN
INTRAMUSCULAR | Status: AC
  Administered 2013-04-04: 18:00:00 via INTRAVENOUS
  Filled 2013-04-04: qty 2000

## 2013-04-04 MED ORDER — POTASSIUM CHLORIDE 10 MEQ/100ML IV SOLN
10.0000 meq | INTRAVENOUS | Status: AC
  Administered 2013-04-04 (×6): 10 meq via INTRAVENOUS
  Filled 2013-04-04: qty 600

## 2013-04-04 MED ORDER — POTASSIUM CHLORIDE 10 MEQ/100ML IV SOLN
10.0000 meq | INTRAVENOUS | Status: AC
  Administered 2013-04-04 (×6): 10 meq via INTRAVENOUS
  Filled 2013-04-04 (×6): qty 100

## 2013-04-04 MED ORDER — SODIUM CHLORIDE 0.45 % IV SOLN
INTRAVENOUS | Status: DC
Start: 2013-04-04 — End: 2013-04-04
  Administered 2013-04-04: 15:00:00 via INTRAVENOUS

## 2013-04-04 MED ORDER — SODIUM CHLORIDE 0.9 % IV SOLN: INTRAVENOUS | Status: DC

## 2013-04-04 NOTE — Progress Notes (Signed)
CRITICAL VALUE ALERT  Critical value received:  K - 2.6 CO2 - >45  Date of notification:  4/19  Time of notification:  05:50  Critical value read back:yes  Nurse who received alert:  Haskell Flirt, RN  MD notified (1st page):  M. Lynch  Time of first page:  05:51  MD notified (2nd page):  Time of second page:  Responding MD:  M. Lynch  Time MD responded:  05:53

## 2013-04-04 NOTE — Progress Notes (Signed)
PARENTERAL NUTRITION CONSULT NOTE - FOLLOW UP  Pharmacy Consult for TPN Indication: enterocutaneous fistula, abdominal carcinomatosis  Allergies  Allergen Reactions  . Percocet (Oxycodone-Acetaminophen) Nausea And Vomiting    hallucination    Patient Measurements: Height: 6\' 4"  (193 cm) Weight: 168 lb 6.9 oz (76.4 kg) IBW/kg (Calculated) : 86.8 Usual Weight: 170-180 lbs  Vital Signs: Temp: 98 F (36.7 C) (04/19 0400) Temp src: Oral (04/19 0400) BP: 105/73 mmHg (04/19 0755) Pulse Rate: 94 (04/19 0755) Intake/Output from previous day: 04/18 0701 - 04/19 0700 In: 10138.2 [P.O.:2480; I.V.:1749.2; IV Piggyback:700; TPN:1209] Out: 7025 [Urine:2025; Drains:5000] Intake/Output from this shift: Total I/O In: 100 [IV Piggyback:100] Out: -   Labs:  Recent Labs  04/03/13 0230 04/03/13 0600 04/04/13 0441  WBC 4.1 2.3* 3.2*  HGB 16.9 9.6* 10.0*  HCT 49.4 29.7* 30.9*  PLT 263 274 316     Recent Labs  04/03/13 0230 04/03/13 0600 04/03/13 1630 04/04/13 0441  NA 141 147*  --  146*  K 4.0 2.9* 2.7* 2.6*  CL 86* 94*  --  89*  CO2 44* 44*  --  >45*  GLUCOSE 98 129*  --  170*  BUN 76* 68*  --  61*  CREATININE 1.38* 1.21  --  1.23  CALCIUM 10.4 8.9  --  9.8  MG  --  2.2  --  2.2  PHOS  --  5.5*  --  5.5*  PROT 7.9 6.7  --  6.9  ALBUMIN 2.7* 2.5*  --  2.6*  AST 54* 42*  --  41*  ALT 70* 58*  --  55*  ALKPHOS 401* 335*  --  336*  BILITOT 0.6 0.6  --  0.5   Estimated Creatinine Clearance: 72.5 ml/min (by C-G formula based on Cr of 1.23).    Recent Labs  04/03/13 1758 04/04/13 0005 04/04/13 0624  GLUCAP 165* 163* 195*    Insulin Requirements in the past 24 hours:  6 units of SSI + 20 units of regular insulin provided in the TNA  Current Nutrition:  PTA TPN containing 160g protein and 368g dextrose with 41 units of regular insulin cycled over 18 hours with 50g lipids given over 12 hours for 2341 kcal/day total (total volume 2300 mL).  Nutritional Goals:  RD  recommendations (4/18): Kcal: 2220-2565 Protein: 100-132 grams Fluid: 2.3-2.6 L   Can achieve these nutrition goals with Clinimix E5/20 at a goal rate of 100 ml/hr + IVFE 20% at 10 ml/hr on MWF to provide: 120g/day protein, 2318 Kcal/day avg.(2112 Kcal/day STTHS, 2592 Kcal/day MWF).   Assessment:  80 yoM with complicated PMH of metastatic adenocarcinoma, peritoneal carcinomatosis s/p extensive debulking procedures and abdominal surgeries which has left him with multiple drains and fistulas on chronic TPN via a PICC line. Discharged from [facility] recently and was transferred to his nursing home in [place] started developing N/V and weakness. Was receiving TPN cycled over 18 hours at [facility]. Will continue TPN inpatient but will be difficult to provide large protein and kcal amounts with premixed Clinimix TNAs.  RD evaluated the patient 4/18 and recommended a reduced protein goal than PTA.  Pharmacy will be able to meet new nutrition goals with the premixed Clinimix product.  Will provide TNA continuously inpatient due to high volume that needs to be administered with premixed product.  Labs:  Glucose: CBGs uncontrolled with start of TNA (163-195).  Had been conservative while initiating new TNA.  Will increase insulin in TNA today.  Electrolytes: K+  decreased despite replacement with 120 mEq of KCl runs yesterday.   MD continuing replacement today for K+ of 2.6.  Not concerned for refeeding since Phos level is high and Mag is WNL.  K+ loss most likely from extreme GI losses - gastrostomy drain output recorded at 5000 mL yesterday.  Will continue to provide TNA and monitor/replace K+.  LFTs: AST/ALT mildly elevated bu stable at 41/55; Alk Phos elevated but appears stable  TGs: pending  Prealbumin: pending  Renal: SCr 1.23 (elevated d/t dehydration); will follow closely considering high protein administration in TNA.  Plan:  At 1800 today:  Continue Clinimix E5/20 at 83 ml/hr.  At this  rate, we are currently meeting our protein goal by providing 100g of protein per day.  Will not advance rate to meet kcal goals until can get CBGs controlled.  Goal is Clinimix E5/20 at 100 ml/hr. Increase regular insulin to TNA at 15 units/L. Continue sensitive SSI q6h.  May need to adjust to moderate scale.  Fat emulsion at 10 ml/hr (MWF only due to ongoing shortage).  TNA to contain standard multivitamins and trace elements (MWF only due to ongoing shortage).  MIVF at [facility] now.  TNA providing 2L of volume daily. TNA lab panels on Mondays & Thursdays.  F/u K+ this afternoon. CMP in AM.  Clance Boll, PharmD, BCPS Pager: [phone] 04/04/2013 8:07 AM

## 2013-04-04 NOTE — Progress Notes (Signed)
[facility] PROGRESS NOTE  [name] ZOX:[id number] DOB: 1956/01/01 DOA: 04/03/2013 PCP: [name], MD  Assessment/Plan: 1. Hypotension from dehydration secondary to nausea and vomiting -resolved. Nausea and vomiting resolved. Continue with IV fluids. Repeat BMP in am showed persistent hypokalemia, but improvement in renal function and normal lactic acid.  2. Acute renal failure secondary dehydration -  Improved. closely follow intake output and metabolic panel. 3. Abdominal carcinomatosis - Treatment per oncologist. Patient presently follows at [facility]. 4. On TPN - pharmacy to assist. 5. History of gout - presently not having any flares. 6. Hypokalemia: replete as needed.  7. DVT prophylaxis.   HPI/Subjective: Feeling better   Objective: Filed Vitals:   04/04/13 0000 04/04/13 0400 04/04/13 0755 04/04/13 0800  BP: 95/76 91/69 105/73   Pulse: 102 97 94   Temp: 98.2 F (36.8 C) 98 F (36.7 C)  97.7 F (36.5 C)  TempSrc: Oral Oral  Oral  Resp: 17 21 19    Height:      Weight: 76.4 kg (168 lb 6.9 oz)     SpO2: 100% 100% 100%     Intake/Output Summary (Last 24 hours) at 04/04/13 1111 Last data filed at 04/04/13 1000  Gross per 24 hour  Intake 9487.17 ml  Output   7975 ml  Net 1512.17 ml   Filed Weights   04/03/13 0302 04/03/13 0500 04/04/13 0000  Weight: 81.647 kg (180 lb) 77.3 kg (170 lb 6.7 oz) 76.4 kg (168 lb 6.9 oz)    Exam:  General: Well-developed and poorly nourished.  Cardiovascular: S1-S2 heard tachycardic.  Respiratory: No rhonchi or crepitations.  Abdomen: colostomy pouch and suction tube in place.  Skin: No new rash.  Musculoskeletal: No edema.   Neurologic: Moves all extremities.   Data Reviewed: Basic Metabolic Panel:  Recent Labs Lab 04/03/13 0230 04/03/13 0600 04/03/13 1630 04/04/13 0441  NA 141 147*  --  146*  K 4.0 2.9* 2.7* 2.6*  CL 86* 94*  --  89*  CO2 44* 44*  --  >45*  GLUCOSE 98 129*  --  170*  BUN  76* 68*  --  61*  CREATININE 1.38* 1.21  --  1.23  CALCIUM 10.4 8.9  --  9.8  MG  --  2.2  --  2.2  PHOS  --  5.5*  --  5.5*   Liver Function Tests:  Recent Labs Lab 04/03/13 0230 04/03/13 0600 04/04/13 0441  AST 54* 42* 41*  ALT 70* 58* 55*  ALKPHOS 401* 335* 336*  BILITOT 0.6 0.6 0.5  PROT 7.9 6.7 6.9  ALBUMIN 2.7* 2.5* 2.6*   No results found for this basename: LIPASE, AMYLASE,  in the last 168 hours No results found for this basename: AMMONIA,  in the last 168 hours CBC:  Recent Labs Lab 04/03/13 0230 04/03/13 0600 04/04/13 0441  WBC 4.1 2.3* 3.2*  NEUTROABS 2.1 1.3* 1.9  HGB 16.9 9.6* 10.0*  HCT 49.4 29.7* 30.9*  MCV 95.7 97.7 98.7  PLT 263 274 316   Cardiac Enzymes:  Recent Labs Lab 04/03/13 0230  TROPONINI <0.30   BNP (last 3 results)  Recent Labs  12/22/12 1700  PROBNP 33.8   CBG:  Recent Labs Lab 04/03/13 0522 04/03/13 1124 04/03/13 1758 04/04/13 0005 04/04/13 0624  GLUCAP 106* 143* 165* 163* 195*    Recent Results (from the past 240 hour(s))  MRSA PCR SCREENING     Status: Abnormal   Collection Time    04/03/13  5:00 AM      Result Value Range Status   MRSA by PCR POSITIVE (*) NEGATIVE Final   Comment:            The GeneXpert MRSA Assay (FDA     approved for NASAL specimens     only), is one component of a     comprehensive MRSA colonization     surveillance program. It is not     intended to diagnose MRSA     infection nor to guide or     monitor treatment for     MRSA infections.     RESULT CALLED TO, READ BACK BY AND VERIFIED WITH:     C.CAGLE AT 0743 ON 18APR14 BY C.BONGEL     Studies: Dg Chest [facility] 1 View  04/03/2013  *RADIOLOGY REPORT*  Clinical Data: Weakness and shortness of breath.  PORTABLE CHEST - 1 VIEW  Comparison: Chest radiograph performed 01/17/2013  Findings: The patient's right PICC is noted ending about the mid SVC.  The lungs are well-aerated and clear.  There is no evidence of focal opacification,  pleural effusion or pneumothorax.  The cardiomediastinal silhouette is within normal limits.  No acute osseous abnormalities are seen.  IMPRESSION: No acute cardiopulmonary process seen.   Original Report Authenticated By: Tonia Ghent, M.D.     Scheduled Meds: . antiseptic oral rinse  15 mL Mouth Rinse q12n4p  . chlorhexidine  15 mL Mouth Rinse BID  . clindamycin  1 application Topical BID  . enoxaparin (LOVENOX) injection  40 mg Subcutaneous Q24H  . fludrocortisone  0.1 mg Oral BID  . insulin aspart  0-9 Units Subcutaneous Q6H  . midodrine  10 mg Oral TID  . minocycline  100 mg Oral BID  . pantoprazole  40 mg Oral q morning - 10a  . potassium chloride  10 mEq Intravenous Q1 Hr x 6  . pregabalin  50 mg Oral TID  . sodium chloride  3 mL Intravenous Q12H  . sucralfate  1 g Oral QID  . vitamin C  500 mg Oral q morning - 10a  . zinc sulfate  220 mg Oral q morning - 10a   Continuous Infusions: . Marland KitchenTPN (CLINIMIX-E) Adult 83 mL/hr at 04/03/13 1724  . Marland KitchenTPN (CLINIMIX-E) Adult    . sodium chloride Stopped (04/04/13 0957)  . fat emulsion 240 mL (04/03/13 1715)    Principal Problem:   Dehydration secondary to nausea and vomiting Active Problems:   Abdominal malignant neoplasm   Acute kidney injury secondary to dehydration        [facility]  [facility] Pager [phone]. If 7PM-7AM, please contact night-coverage at [email or web address], password [facility] 04/04/2013, 11:11 AM  LOS: 1 day

## 2013-04-04 NOTE — Evaluation (Signed)
Physical Therapy Evaluation Patient Details Name: [name] MRN: [id number] DOB: 10/11/1956 Today's Date: 04/04/2013 Time: 1308-6578 PT Time Calculation (min): 24 min  PT Assessment / Plan / Recommendation Clinical Impression  Pt presents with dehydration secondary to n/v.  Note that he has history of progressive abdominal carcinoma with several drains and fistulas present.  Tolerated OOB to chair, however requires +2 assist at this time for safety with max cues for safety due to moderate impulsiveness.  Pt will benefit from skilled PT in acute venue to address deficits.  PT recommends SNF at D/C to maximize pts safety and function     PT Assessment  Patient needs continued PT services    Follow Up Recommendations  SNF;Supervision/Assistance - 24 hour    Does the patient have the potential to tolerate intense rehabilitation      Barriers to Discharge None      Equipment Recommendations  None recommended by PT    Recommendations for Other Services OT consult   Frequency Min 3X/week    Precautions / Restrictions Precautions Precautions: Fall Precaution Comments: multiple lines/drains Restrictions Weight Bearing Restrictions: No   Pertinent Vitals/Pain No pain stated during eval      Mobility  Bed Mobility Bed Mobility: Supine to Sit Supine to Sit: 4: Min guard;HOB elevated Details for Bed Mobility Assistance: Min/guard to ensure safety of trunk when sitting.  Pt somewhat impulsive with bed mobility.  Transfers Transfers: Sit to Stand;Stand to Sit;Stand Pivot Transfers Sit to Stand: 1: +2 Total assist;From elevated surface;With upper extremity assist;From bed Sit to Stand: Patient Percentage: 70% Stand to Sit: 1: +2 Total assist;With upper extremity assist;With armrests;To chair/3-in-1 Stand to Sit: Patient Percentage: 70% Stand Pivot Transfers: 1: +2 Total assist Stand Pivot Transfers: Patient Percentage: 60% Details for Transfer Assistance: Assist to rise,  steady and ensure controlled descent with MAX cues for hand placement and safety.  Pt states he is very weak and attempted several times to lie back down.  Provided max encouragement to at least sit up to try and get to the chair.  He was finally able to take a few steps from bed to chair with +2 assist for safety and cues for sequencing/technique and keeping RW with him.   Ambulation/Gait Ambulation/Gait Assistance: Not tested (comment) Assistive device: Rolling walker Stairs: No Wheelchair Mobility Wheelchair Mobility: No    Exercises     PT Diagnosis: Difficulty walking;Generalized weakness  PT Problem List: Decreased strength;Decreased activity tolerance;Decreased balance;Decreased mobility;Decreased coordination;Decreased knowledge of use of DME;Decreased safety awareness;Decreased knowledge of precautions PT Treatment Interventions: DME instruction;Gait training;Functional mobility training;Therapeutic activities;Therapeutic exercise;Balance training;Neuromuscular re-education;Patient/family education   PT Goals Acute Rehab PT Goals PT Goal Formulation: With patient Time For Goal Achievement: 04/11/13 Potential to Achieve Goals: Good Pt will go Sit to Supine/Side: with supervision PT Goal: Sit to Supine/Side - Progress: Goal set today Pt will go Sit to Stand: with min assist PT Goal: Sit to Stand - Progress: Goal set today Pt will go Stand to Sit: with min assist PT Goal: Stand to Sit - Progress: Goal set today Pt will Transfer Bed to Chair/Chair to Bed: with min assist PT Transfer Goal: Bed to Chair/Chair to Bed - Progress: Goal set today Pt will Ambulate: 51 - 150 feet;with mod assist;with least restrictive assistive device PT Goal: Ambulate - Progress: Goal set today Pt will Perform Home Exercise Program: with supervision, verbal cues required/provided PT Goal: Perform Home Exercise Program - Progress: Goal set today  Visit Information  Last PT Received On:  04/04/13 Assistance Needed: +2    Subjective Data  Subjective: I really want to walk.  I am so weak.  Patient Stated Goal: to get back to walking   Prior Functioning  Home Living Lives With: Other (Comment) (SNF) Available Help at Discharge: Skilled Nursing Facility Type of Home: Skilled Nursing Facility Home Adaptive Equipment: Walker - rolling Additional Comments: Pt states that he has been in/out of hospitals a lot lately, but normally lives in a "home" (SNF).   Prior Function Level of Independence: Needs assistance Communication Communication: No difficulties    Cognition  Cognition Arousal/Alertness: Awake/alert Behavior During Therapy: Impulsive Overall Cognitive Status: No family/caregiver present to determine baseline cognitive functioning    Extremity/Trunk Assessment Right Lower Extremity Assessment RLE ROM/Strength/Tone: Deficits RLE ROM/Strength/Tone Deficits: Grossly 3/5 per functional observation RLE Sensation: WFL - Light Touch Left Lower Extremity Assessment LLE ROM/Strength/Tone: Deficits LLE ROM/Strength/Tone Deficits: grossly 3/5 per functional observation.  LLE Sensation: WFL - Light Touch Trunk Assessment Trunk Assessment: Kyphotic   Balance    End of Session PT - End of Session Equipment Utilized During Treatment: Gait belt Activity Tolerance: Patient limited by fatigue Patient left: in chair;with call bell/phone within reach;with nursing in room Nurse Communication: Mobility status  GP     Vista Deck 04/04/2013, 3:15 PM

## 2013-04-04 NOTE — Progress Notes (Signed)
CRITICAL VALUE ALERT  Critical value received: K+ 2.6 CO2 >45  Date of notification: 04/04/2013  Time of notification:  1448 Critical value read back:yes  Nurse who received alert:  Lorrin Jackson RN  MD notified (1st page): Dr. Blake Divine  Time of first page:  1449  MD notified (2nd page):n/a  Time of second page:n/a  Responding MD:  Dr. Blake Divine  Time MD responded:  [phone]

## 2013-04-05 DIAGNOSIS — D638 Anemia in other chronic diseases classified elsewhere: Secondary | ICD-10-CM

## 2013-04-05 LAB — COMPREHENSIVE METABOLIC PANEL
AST: 66 U/L — ABNORMAL HIGH (ref 0–37)
Albumin: 2.6 g/dL — ABNORMAL LOW (ref 3.5–5.2)
Calcium: 10 mg/dL (ref 8.4–10.5)
Creatinine, Ser: 1.92 mg/dL — ABNORMAL HIGH (ref 0.50–1.35)
Total Protein: 7.2 g/dL (ref 6.0–8.3)

## 2013-04-05 LAB — URINALYSIS, ROUTINE W REFLEX MICROSCOPIC
Glucose, UA: NEGATIVE mg/dL
Hgb urine dipstick: NEGATIVE
Specific Gravity, Urine: 1.016 (ref 1.005–1.030)
pH: 8.5 — ABNORMAL HIGH (ref 5.0–8.0)

## 2013-04-05 LAB — GLUCOSE, CAPILLARY
Glucose-Capillary: 178 mg/dL — ABNORMAL HIGH (ref 70–99)
Glucose-Capillary: 152 mg/dL — ABNORMAL HIGH (ref 70–99)
Glucose-Capillary: 199 mg/dL — ABNORMAL HIGH (ref 70–99)
Glucose-Capillary: 79 mg/dL (ref 70–99)
Glucose-Capillary: 148 mg/dL — ABNORMAL HIGH (ref 70–99)

## 2013-04-05 LAB — BASIC METABOLIC PANEL
BUN: 76 mg/dL — ABNORMAL HIGH (ref 6–23)
Calcium: 10.1 mg/dL (ref 8.4–10.5)
GFR calc non Af Amer: 36 mL/min — ABNORMAL LOW (ref >90)
Glucose, Bld: 140 mg/dL — ABNORMAL HIGH (ref 70–99)
Potassium: 3.2 mEq/L — ABNORMAL LOW (ref 3.5–5.1)

## 2013-04-05 LAB — POTASSIUM
Potassium: 3.3 mEq/L — ABNORMAL LOW (ref 3.5–5.1)
Potassium: 2.7 mEq/L — CL (ref 3.5–5.1)

## 2013-04-05 MED ORDER — INSULIN REGULAR HUMAN 100 UNIT/ML IJ SOLN
INTRAVENOUS | Status: AC
  Administered 2013-04-05: 18:00:00 via INTRAVENOUS
  Filled 2013-04-05: qty 2000

## 2013-04-05 MED ORDER — POTASSIUM CHLORIDE 10 MEQ/50ML IV SOLN
10.0000 meq | INTRAVENOUS | Status: AC
  Administered 2013-04-05 – 2013-04-06 (×6): 10 meq via INTRAVENOUS
  Filled 2013-04-05 (×6): qty 50

## 2013-04-05 MED ORDER — DEXTROSE 5 % AND 0.45 % NACL IV BOLUS
500.0000 mL | Freq: Once | INTRAVENOUS | Status: AC
  Administered 2013-04-05: 500 mL via INTRAVENOUS

## 2013-04-05 MED ORDER — POTASSIUM CHLORIDE 10 MEQ/100ML IV SOLN
10.0000 meq | INTRAVENOUS | Status: AC
  Administered 2013-04-05 (×6): 10 meq via INTRAVENOUS
  Filled 2013-04-05: qty 600

## 2013-04-05 MED ORDER — POTASSIUM CHLORIDE 10 MEQ/100ML IV SOLN
10.0000 meq | INTRAVENOUS | Status: AC
  Administered 2013-04-05 (×3): 10 meq via INTRAVENOUS
  Filled 2013-04-05: qty 300

## 2013-04-05 MED ORDER — DEXTROSE 5 % IV SOLN
INTRAVENOUS | Status: DC
  Administered 2013-04-05: 19:00:00 via INTRAVENOUS

## 2013-04-05 MED ORDER — SODIUM CHLORIDE 0.9 % IV SOLN
INTRAVENOUS | Status: DC
  Administered 2013-04-05 – 2013-04-06 (×4): via INTRAVENOUS

## 2013-04-05 MED ORDER — POTASSIUM CHLORIDE 10 MEQ/100ML IV SOLN: 10.0000 meq | INTRAVENOUS | Status: DC

## 2013-04-05 MED ORDER — POTASSIUM CHLORIDE 2 MEQ/ML IV SOLN
INTRAVENOUS | Status: DC
  Administered 2013-04-05 (×2): via INTRAVENOUS
  Filled 2013-04-05 (×3): qty 1000

## 2013-04-05 MED ORDER — POTASSIUM CHLORIDE 10 MEQ/50ML IV SOLN: 10.0000 meq | INTRAVENOUS | Status: DC

## 2013-04-05 MED ORDER — SODIUM CHLORIDE 0.45 % IV SOLN: INTRAVENOUS | Status: DC

## 2013-04-05 MED ORDER — POTASSIUM CHLORIDE 2 MEQ/ML IV SOLN
INTRAVENOUS | Status: DC
  Filled 2013-04-05: qty 1000

## 2013-04-05 MED ORDER — INSULIN ASPART 100 UNIT/ML ~~LOC~~ SOLN
0.0000 [IU] | SUBCUTANEOUS | Status: DC
  Administered 2013-04-05: 2 [IU] via SUBCUTANEOUS
  Administered 2013-04-05: 3 [IU] via SUBCUTANEOUS
  Administered 2013-04-05 – 2013-04-11 (×10): 2 [IU] via SUBCUTANEOUS
  Administered 2013-04-11: 3 [IU] via SUBCUTANEOUS
  Administered 2013-04-11 – 2013-04-16 (×6): 2 [IU] via SUBCUTANEOUS

## 2013-04-05 NOTE — Progress Notes (Signed)
[facility] PROGRESS NOTE  [name] ZOX:[id number] DOB: October 17, 1956 DOA: 04/03/2013 PCP: [name], MD  Assessment/Plan: 1. Hypotension from dehydration secondary to nausea and vomiting -resolved. Nausea and vomiting resolved. Continue with IV fluids. Repeat BMP in am showed persistent hypokalemia, but improvement in renal function and normal lactic acid.  2. Acute renal failure/ metabolic alkalosis  -  Improved, secondary to ongoing GI loses . closely follow intake output and metabolic panel. Renal consult requested to assist with the management of alkalosis.  3. Abdominal carcinomatosis - Treatment per oncologist. Patient presently follows at [facility]. 4. On TPN - pharmacy to assist. 5. History of gout - presently not having any flares. 6. Hypokalemia: replete as needed.  7. DVT prophylaxis.   HPI/Subjective: Feeling better   Objective: Filed Vitals:   04/05/13 0800 04/05/13 1057 04/05/13 1200 04/05/13 1249  BP:  87/68  97/81  Pulse:  97  108  Temp: 96.7 F (35.9 C)  97.3 F (36.3 C)   TempSrc: Oral  Oral   Resp:  16  16  Height:      Weight:      SpO2:  100%  98%    Intake/Output Summary (Last 24 hours) at 04/05/13 1429 Last data filed at 04/05/13 1400  Gross per 24 hour  Intake 5680.75 ml  Output   7300 ml  Net -1619.25 ml   Filed Weights   04/03/13 0500 04/04/13 0000 04/05/13 0500  Weight: 77.3 kg (170 lb 6.7 oz) 76.4 kg (168 lb 6.9 oz) 78 kg (171 lb 15.3 oz)    Exam:  General: Well-developed and poorly nourished.  Cardiovascular: S1-S2 heard tachycardic.  Respiratory: No rhonchi or crepitations.  Abdomen: colostomy pouch and suction tube in place.  Skin: No new rash.  Musculoskeletal: No edema.   Neurologic: Moves all extremities.   Data Reviewed: Basic Metabolic Panel:  Recent Labs Lab 04/03/13 0600  04/04/13 0441 04/04/13 1345 04/04/13 2230 04/05/13 0750 04/05/13 0930 04/05/13 1115  NA 147*  --  146* 147* 147*  147* 146*  --   K 2.9*  < > 2.6* 2.6* 2.8* 3.3* 3.2* 3.3*  CL 94*  --  89* 87* 84* 82* 81*  --   CO2 44*  --  >45* >45* >45* >45* >45*  --   GLUCOSE 129*  --  170* 147* 157* 173* 140*  --   BUN 68*  --  61* 65* 67* 75* 76*  --   CREATININE 1.21  --  1.23 1.36* 1.64* 1.92* 1.96*  --   CALCIUM 8.9  --  9.8 10.3 10.1 10.0 10.1  --   MG 2.2  --  2.2  --   --   --   --   --   PHOS 5.5*  --  5.5*  --   --   --   --   --   < > = values in this interval not displayed. Liver Function Tests:  Recent Labs Lab 04/03/13 0230 04/03/13 0600 04/04/13 0441 04/05/13 0750  AST 54* 42* 41* 66*  ALT 70* 58* 55* 71*  ALKPHOS 401* 335* 336* 380*  BILITOT 0.6 0.6 0.5 0.8  PROT 7.9 6.7 6.9 7.2  ALBUMIN 2.7* 2.5* 2.6* 2.6*   No results found for this basename: LIPASE, AMYLASE,  in the last 168 hours No results found for this basename: AMMONIA,  in the last 168 hours CBC:  Recent Labs Lab 04/03/13 0230 04/03/13 0600 04/04/13 0441  WBC 4.1 2.3*  3.2*  NEUTROABS 2.1 1.3* 1.9  HGB 16.9 9.6* 10.0*  HCT 49.4 29.7* 30.9*  MCV 95.7 97.7 98.7  PLT 263 274 316   Cardiac Enzymes:  Recent Labs Lab 04/03/13 0230  TROPONINI <0.30   BNP (last 3 results)  Recent Labs  12/22/12 1700  PROBNP 33.8   CBG:  Recent Labs Lab 04/04/13 1813 04/05/13 0015 04/05/13 0649 04/05/13 1019 04/05/13 1158  GLUCAP 141* 178* 152* 131* 199*    Recent Results (from the past 240 hour(s))  CULTURE, BLOOD (ROUTINE X 2)     Status: None   Collection Time    04/03/13  3:00 AM      Result Value Range Status   Specimen Description BLOOD LEFT HAND   Final   Special Requests BOTTLES DRAWN AEROBIC AND ANAEROBIC 5CC   Final   Culture  Setup Time 04/03/2013 08:32   Final   Culture     Final   Value:        BLOOD CULTURE RECEIVED NO GROWTH TO DATE CULTURE WILL BE HELD FOR 5 DAYS BEFORE ISSUING A FINAL NEGATIVE REPORT   Report Status PENDING   Incomplete  CULTURE, BLOOD (ROUTINE X 2)     Status: None   Collection  Time    04/03/13  3:05 AM      Result Value Range Status   Specimen Description BLOOD RIGHT HAND   Final   Special Requests BOTTLES DRAWN AEROBIC AND ANAEROBIC 5CC   Final   Culture  Setup Time 04/03/2013 08:32   Final   Culture     Final   Value:        BLOOD CULTURE RECEIVED NO GROWTH TO DATE CULTURE WILL BE HELD FOR 5 DAYS BEFORE ISSUING A FINAL NEGATIVE REPORT   Report Status PENDING   Incomplete  URINE CULTURE     Status: None   Collection Time    04/03/13  3:45 AM      Result Value Range Status   Specimen Description URINE, CLEAN CATCH   Final   Special Requests NONE   Final   Culture  Setup Time 04/03/2013 08:39   Final   Colony Count NO GROWTH   Final   Culture NO GROWTH   Final   Report Status 04/04/2013 FINAL   Final  MRSA PCR SCREENING     Status: Abnormal   Collection Time    04/03/13  5:00 AM      Result Value Range Status   MRSA by PCR POSITIVE (*) NEGATIVE Final   Comment:            The GeneXpert MRSA Assay (FDA     approved for NASAL specimens     only), is one component of a     comprehensive MRSA colonization     surveillance program. It is not     intended to diagnose MRSA     infection nor to guide or     monitor treatment for     MRSA infections.     RESULT CALLED TO, READ BACK BY AND VERIFIED WITH:     C.CAGLE AT 0743 ON 18APR14 BY C.BONGEL     Studies: No results found.  Scheduled Meds: . antiseptic oral rinse  15 mL Mouth Rinse q12n4p  . chlorhexidine  15 mL Mouth Rinse BID  . Chlorhexidine Gluconate Cloth  6 each Topical Q0600  . clindamycin  1 application Topical BID  . enoxaparin (LOVENOX) injection  40 mg Subcutaneous  Q24H  . fludrocortisone  0.1 mg Oral BID  . insulin aspart  0-15 Units Subcutaneous Q4H  . midodrine  10 mg Oral TID  . mupirocin ointment  1 application Nasal BID  . pantoprazole  40 mg Oral q morning - 10a  . pregabalin  50 mg Oral TID  . sodium chloride  3 mL Intravenous Q12H  . vitamin C  500 mg Oral q morning - 10a   . zinc sulfate  220 mg Oral q morning - 10a   Continuous Infusions: . Marland KitchenTPN (CLINIMIX-E) Adult 83 mL/hr at 04/04/13 1740  . Marland KitchenTPN (CLINIMIX-E) Adult    . sodium chloride 0.45 % with kcl 125 mL/hr at 04/05/13 1312    Principal Problem:   Dehydration secondary to nausea and vomiting Active Problems:   Abdominal malignant neoplasm   Acute kidney injury secondary to dehydration        [facility]  [facility] Pager [phone]. If 7PM-7AM, please contact night-coverage at [email or web address], password [facility] 04/05/2013, 2:29 PM  LOS: 2 days

## 2013-04-05 NOTE — Progress Notes (Addendum)
PARENTERAL NUTRITION CONSULT NOTE - FOLLOW UP  Pharmacy Consult for TPN Indication: enterocutaneous fistula, abdominal carcinomatosis  Allergies  Allergen Reactions  . Percocet (Oxycodone-Acetaminophen) Nausea And Vomiting    hallucination    Patient Measurements: Height: 6\' 4"  (193 cm) Weight: 171 lb 15.3 oz (78 kg) IBW/kg (Calculated) : 86.8 Usual Weight: 170-180 lbs  Vital Signs: Temp: 97.6 F (36.4 C) (04/20 0400) Temp src: Oral (04/20 0400) BP: 84/58 mmHg (04/20 0530) Pulse Rate: 102 (04/20 0530) Intake/Output from previous day: 04/19 0701 - 04/20 0700 In: 6397 [P.O.:1280; I.V.:1325; IV Piggyback:1700; TPN:2092] Out: 7550 [Urine:1150; Drains:6400] Intake/Output from this shift: Total I/O In: 158 [I.V.:75; TPN:83] Out: 0   Labs:  Recent Labs  04/03/13 0230 04/03/13 0600 04/04/13 0441  WBC 4.1 2.3* 3.2*  HGB 16.9 9.6* 10.0*  HCT 49.4 29.7* 30.9*  PLT 263 274 316     Recent Labs  04/03/13 0600  04/04/13 0441 04/04/13 0442 04/04/13 1345 04/04/13 2230 04/05/13 0750  NA 147*  --  146*  --  147* 147* 147*  K 2.9*  < > 2.6*  --  2.6* 2.8* 3.3*  CL 94*  --  89*  --  87* 84* 82*  CO2 44*  --  >45*  --  >45* >45* PENDING  GLUCOSE 129*  --  170*  --  147* 157* 173*  BUN 68*  --  61*  --  65* 67* 75*  CREATININE 1.21  --  1.23  --  1.36* 1.64* 1.92*  CALCIUM 8.9  --  9.8  --  10.3 10.1 10.0  MG 2.2  --  2.2  --   --   --   --   PHOS 5.5*  --  5.5*  --   --   --   --   PROT 6.7  --  6.9  --   --   --  7.2  ALBUMIN 2.5*  --  2.6*  --   --   --  2.6*  AST 42*  --  41*  --   --   --  66*  ALT 58*  --  55*  --   --   --  71*  ALKPHOS 335*  --  336*  --   --   --  380*  BILITOT 0.6  --  0.5  --   --   --  0.8  PREALBUMIN  --   --  33.0  --   --   --   --   TRIG  --   --   --  195*  --   --   --   CHOL  --   --   --  181  --   --   --   < > = values in this interval not displayed. Estimated Creatinine Clearance: 47.4 ml/min (by C-G formula based on Cr of  1.92).    Recent Labs  04/04/13 1813 04/05/13 0015 04/05/13 0649  GLUCAP 141* 178* 152*    Insulin Requirements in the past 24 hours:  7 units of SSI + 30 units of regular insulin provided in the TNA  Current Nutrition:  Clinimix E5/20 at 83 ml/hr + lipids at 10 ml/hr MWF. IVF: 0.45% with 40 mEq KCl at 75 ml/hr  PTA TPN containing 160g protein and 368g dextrose with 41 units of regular insulin cycled over 18 hours with 50g lipids given over 12 hours for 2341 kcal/day total (total volume 2300 mL).  Nutritional Goals:  RD recommendations (4/18): Kcal: 2220-2565 Protein: 100-132 grams Fluid: 2.3-2.6 L   Can achieve these nutrition goals with Clinimix E5/20 at a goal rate of 100 ml/hr + IVFE 20% at 10 ml/hr on MWF to provide: 120g/day protein, 2318 Kcal/day avg.(2112 Kcal/day STTHS, 2592 Kcal/day MWF).   Assessment:  58 yoM with complicated PMH of metastatic adenocarcinoma, peritoneal carcinomatosis s/p extensive debulking procedures and abdominal surgeries which has left him with multiple drains and fistulas on chronic TPN via a PICC line. Discharged from [facility] recently and was transferred to his nursing home in [place] started developing N/V and weakness. Was receiving TPN cycled over 18 hours at [facility]. Will continue TPN inpatient but will be difficult to provide large protein and kcal amounts with premixed Clinimix TNAs.  RD evaluated the patient 4/18 and recommended a reduced protein goal than PTA.  Pharmacy will be able to meet new nutrition goals with the premixed Clinimix product.  Will provide TNA continuously inpatient due to high volume that needs to be administered with premixed product.  Labs:  Glucose: CBGs (141-178) improved with increased insulin in TNA (15 units/L) but not yet at goal.  Will increase insulin in TNA again today and adjust SSI to moderate scale.   Electrolytes: K+ remains low as patient continues to have extreme GI losses.  Continues to get  replacements throughout the day.  Not concerned for refeeding since Phos level is high and Mag is WNL.  Will continue to provide TNA and monitor/replace K+.  CO2 high - metabolic alkalosis, again pt with extreme GI losses  LFTs: AST/ALT elevated at 66/71; Alk Phos remains elevated  TGs: 195 (4/19)  Prealbumin: 33 (4/19), WNL  Renal: SCr elevated on admission d/t dehydration but continues to increase as patient continues to lose large amounts of output from drains (>5L/day).  Of note, pt was receiving high amounts of protein in TNA PTA (160g/day).  With increasing SCr, do not want to exacerbate by providing excessive protein parenterally so will maintain protein at ~1.2g/kg/day = 100 g/day which is currently being provided with Clinimix E5/20 at 83 ml/hr.  Plan:  At 1800 today:  Continue Clinimix E5/20 at 83 ml/hr.  At this rate, we are currently meeting our protein goal by providing 100g of protein per day.  Will not advance to meet kcal goal d/t worsening SCr. Increase regular insulin to TNA at 20 units/L. Switch SSI to moderate scale q4h. Fat emulsion at 10 ml/hr (MWF only due to ongoing shortage).  TNA to contain standard multivitamins and trace elements (MWF only due to ongoing shortage).  MIVF per MD. TNA lab panels on Mondays & Thursdays.  F/u K+ this afternoon.  Clance Boll, PharmD, BCPS Pager: [phone] 04/05/2013 8:49 AM  Addendum: 04/05/2013 3:18 PM K+ 3.3  Plan: KCl 10 mEq/100 mL IV x 3 more runs.  Check another K+ at 2000.  Clance Boll, PharmD, BCPS Pager: [phone] 04/05/2013 3:19 PM

## 2013-04-05 NOTE — Consult Note (Addendum)
[name] 04/05/2013 [name] D Requesting Physician:  Dr Blake Divine  Reason for Consult:  Metabolic alkalosis HPI: The patient is a 57 y.o. year-old with hx of peritoneal carcinomatosis diagnosed in 2012.  He had surgery, then chemoRx then another surgery. 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 He has a small bowel obstruction and will start to vomit if gastric contents aren't sucked out by the decompression G-tube.    Patient admitted 4/18 from a SNF for N/V and gen weakness. Getting TNA and having electrolyte problems with metabolic alkalosis, hypernatremia, hypokalemia and acute rise in Bun/creat to 76/1.96 today.  BP's are low in the 80's, on midodrine.   Patient pleasant without complaints. Denies hx of CKD or dialysis.  Not able to eat much by mouth.   Past Medical History:  Past Medical History  Diagnosis Date  . Hypertension   . GERD (gastroesophageal reflux disease)   . Small bowel obstruction 2013  . 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 Acute cholecystitis s/p perc draianage [id number] 12/20/2012  . Anemia of chronic disease 12/19/2012  . 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 MRSA bacteremia 01/04/2013  . SBO (small bowel obstruction) 11/07/2011  . Septic shock due to Staphylococcus aureus 12/24/2012  . Severe protein-calorie malnutrition 01/17/2013    Past Surgical History:  Past Surgical History  Procedure Laterality Date  . Tonsillectomy    . Esophagogastroduodenoscopy  12/11/2012    Procedure: ESOPHAGOGASTRODUODENOSCOPY (EGD);  Surgeon: Louis Meckel, MD;  Location: [facility] ENDOSCOPY;  Service: Endoscopy;  Laterality: N/A;  . Duodenal stent placement  12/11/2012    Procedure: DUODENAL STENT PLACEMENT;  Surgeon: Louis Meckel,  MD;  Location: [facility] ENDOSCOPY;  Service: Endoscopy;  Laterality: N/A;  . Esophagogastroduodenoscopy  12/13/2012    Procedure: ESOPHAGOGASTRODUODENOSCOPY (EGD);  Surgeon: Louis Meckel, MD;  Location: [facility] OR;  Service: Endoscopy;  Laterality: N/A;  with attempt to stent duodenal stricture  . Peg placement  12/12/2012    Procedure: PERCUTANEOUS ENDOSCOPIC GASTROSTOMY (PEG) PLACEMENT;  Surgeon: Louis Meckel, MD;  Location: [facility] ENDOSCOPY;  Service: Endoscopy;  Laterality: N/A;  . Small intestine surgery  jan 2013    [facility].  SB resection, ileostomy, gastrostomy  . Debulking  sep 2013    [facility].  ileal SB resection, debulking of peritoneal carcinomatosis, intraperitoneal chemotherapy  . Colon surgery  oct 2013    [facility] transverse colon perforation  . Enteroscopy  12/30/2012    Procedure: ENTEROSCOPY;  Surgeon: Louis Meckel, MD;  Location: [facility] ENDOSCOPY;  Service: Endoscopy;  Laterality: N/A;  With possible stent placement; please have duodenal stent available  . Duodenal stent placement  12/30/2012    Procedure: DUODENAL STENT PLACEMENT;  Surgeon: Louis Meckel, MD;  Location: [facility] ENDOSCOPY;  Service: Endoscopy;  Laterality: N/A;    Family History:  Family History  Problem Relation Age of Onset  . Lung cancer Brother   . Hypertension Mother   . Hypertension Father   . Heart failure Father    Social History:  reports that he has never smoked. He has never used smokeless tobacco. He reports that he does not drink alcohol or use illicit drugs.  Allergies:  Allergies  Allergen Reactions  . Percocet (Oxycodone-Acetaminophen) Nausea And  Vomiting    hallucination    Home medications: Prior to Admission medications   Medication Sig Start Date End Date Taking? Authorizing Provider  clindamycin (CLEOCIN T) 1 % lotion Apply 1 application topically 2 (two) times daily. Apply to chest and face   Yes Historical Provider, MD  enoxaparin (LOVENOX) 40 MG/0.4ML injection Inject 40 mg into the skin  every morning.   Yes Historical Provider, MD  fludrocortisone (FLORINEF) 0.1 MG tablet Take 0.1 mg by mouth 2 (two) times daily.   Yes Historical Provider, MD  HYDROcodone-acetaminophen (NORCO/VICODIN) 5-325 MG per tablet Take 1 tablet by mouth every 4 (four) hours as needed for pain.   Yes Historical Provider, MD  midodrine (PROAMATINE) 10 MG tablet Take 10 mg by mouth 3 (three) times daily.   Yes Historical Provider, MD  minocycline (MINOCIN,DYNACIN) 100 MG capsule Take 100 mg by mouth 2 (two) times daily.   Yes Historical Provider, MD  pantoprazole (PROTONIX) 40 MG tablet Take 40 mg by mouth every morning.   Yes Historical Provider, MD  pregabalin (LYRICA) 50 MG capsule Take 50 mg by mouth 3 (three) times daily.   Yes Historical Provider, MD  promethazine (PHENERGAN) 12.5 MG tablet Take 12.5 mg by mouth every 6 (six) hours as needed for nausea.   Yes Historical Provider, MD  sucralfate (CARAFATE) 1 GM/10ML suspension Take 1 g by mouth 4 (four) times daily.   Yes Historical Provider, MD  vitamin C (ASCORBIC ACID) 500 MG tablet Take 500 mg by mouth every morning.   Yes Historical Provider, MD  zinc sulfate 220 MG capsule Take 220 mg by mouth every morning.   Yes Historical Provider, MD  nitroGLYCERIN (NITROSTAT) 0.4 MG SL tablet Place 0.4 mg under the tongue every 5 (five) minutes as needed for chest pain. For chest pain, Hold for SBP<100    Historical Provider, MD    Labs: Basic Metabolic Panel:  Recent Labs Lab 04/03/13 0230 04/03/13 0600 04/03/13 1630 04/04/13 0441 04/04/13 1345 04/04/13 2230 04/05/13 0750 04/05/13 0930 04/05/13 1115  NA 141 147*  --  146* 147* 147* 147* 146*  --   K 4.0 2.9* 2.7* 2.6* 2.6* 2.8* 3.3* 3.2* 3.3*  CL 86* 94*  --  89* 87* 84* 82* 81*  --   CO2 44* 44*  --  >45* >45* >45* >45* >45*  --   GLUCOSE 98 129*  --  170* 147* 157* 173* 140*  --   BUN 76* 68*  --  61* 65* 67* 75* 76*  --   CREATININE 1.38* 1.21  --  1.23 1.36* 1.64* 1.92* 1.96*  --   CALCIUM  10.4 8.9  --  9.8 10.3 10.1 10.0 10.1  --   PHOS  --  5.5*  --  5.5*  --   --   --   --   --    Liver Function Tests:  Recent Labs Lab 04/03/13 0600 04/04/13 0441 04/05/13 0750  AST 42* 41* 66*  ALT 58* 55* 71*  ALKPHOS 335* 336* 380*  BILITOT 0.6 0.5 0.8  PROT 6.7 6.9 7.2  ALBUMIN 2.5* 2.6* 2.6*   No results found for this basename: LIPASE, AMYLASE,  in the last 168 hours No results found for this basename: AMMONIA,  in the last 168 hours CBC:  Recent Labs Lab 04/03/13 0230 04/03/13 0600 04/04/13 0441  WBC 4.1 2.3* 3.2*  NEUTROABS 2.1 1.3* 1.9  HGB 16.9 9.6* 10.0*  HCT 49.4 29.7* 30.9*  MCV 95.7  97.7 98.7  PLT 263 274 316   PT/INR: @LABRCNTIP (inr:5) Cardiac Enzymes: ) Recent Labs Lab 04/03/13 0230  TROPONINI <0.30   CBG:  Recent Labs Lab 04/04/13 1813 04/05/13 0015 04/05/13 0649 04/05/13 1019 04/05/13 1158  GLUCAP 141* 178* 152* 131* 199*     Physical Exam:  Blood pressure 89/56, pulse 99, temperature 97.3 F (36.3 C), temperature source Oral, resp. rate 13, height 6\' 4"  (1.93 m), weight 78 kg (171 lb 15.3 oz), SpO2 100.00%.  Gen: alert, thin AAM no distress Skin: no rash, cyanosis HEENT:  EOMI, sclera anicteric, throat dry Neck: no JVD, no LAN Chest: clear throughout CV: regular, no rub or gallop, no carotid or femoral bruits, pedal pulses intact Abdomen: soft, mid abdomen ileostomy, mid abd open wound with bag in place draining, LUQ small bore PEG tube hooked to suction, no ascites Ext: no edema x 4 ext, no joint effusion Neuro: alert, Ox3, no focal deficit   Impression/Plan 1. Hypernatremia, hypovolemic- needs volume repletion and free water administration. Has 2L water deficit and probable 3-4 L sodium deficit.  Getting TNA at 2L day, po intake 1L/day and G-tube losses 5-6L/day.  Will give IV D5W at 100/hr to cover losses and 1/2 of deficit.  Will give normal saline at 250 cc/hr (6L/day) to hopefully increase volume by 2L/day net  + 2. Hypochloremic met alkalosis- due to chronic high volume gastric suction. See above. 3. Acute renal failure is due to hypotension from volume depletion 4. Metastatic peritoneal carcinomatosis- had signet and renal features   Vinson Moselle  MD [facility] [phone] pgr     [phone] cell 04/05/2013, 5:07 PM

## 2013-04-05 NOTE — Progress Notes (Deleted)
eLink Physician-Brief Progress Note Patient Name: [name] DOB: 1956-07-11 MRN: [id number]  Date of Service  04/05/2013   HPI/Events of Note   hypokalemia  eICU Interventions  repleted potassium with IV   Intervention Category Intermediate Interventions: Electrolyte abnormality - evaluation and management  Hasna Stefanik K. 04/05/2013, 5:29 AM

## 2013-04-06 ENCOUNTER — Inpatient Hospital Stay (HOSPITAL_COMMUNITY): Payer: 59

## 2013-04-06 DIAGNOSIS — R112 Nausea with vomiting, unspecified: Secondary | ICD-10-CM

## 2013-04-06 LAB — GLUCOSE, CAPILLARY
Glucose-Capillary: 127 mg/dL — ABNORMAL HIGH (ref 70–99)
Glucose-Capillary: 144 mg/dL — ABNORMAL HIGH (ref 70–99)
Glucose-Capillary: 123 mg/dL — ABNORMAL HIGH (ref 70–99)
Glucose-Capillary: 106 mg/dL — ABNORMAL HIGH (ref 70–99)

## 2013-04-06 LAB — BASIC METABOLIC PANEL
BUN: 72 mg/dL — ABNORMAL HIGH (ref 6–23)
Calcium: 8.9 mg/dL (ref 8.4–10.5)
Chloride: 86 mEq/L — ABNORMAL LOW (ref 96–112)
Creatinine, Ser: 2.16 mg/dL — ABNORMAL HIGH (ref 0.50–1.35)
GFR calc Af Amer: 38 mL/min — ABNORMAL LOW (ref >90)
GFR calc non Af Amer: 32 mL/min — ABNORMAL LOW (ref >90)

## 2013-04-06 LAB — CBC
HCT: 27 % — ABNORMAL LOW (ref 39.0–52.0)
Hemoglobin: 8.8 g/dL — ABNORMAL LOW (ref 13.0–17.0)
MCHC: 32.6 g/dL (ref 30.0–36.0)
MCV: 99.3 fL (ref 78.0–100.0)
RDW: 18.6 % — ABNORMAL HIGH (ref 11.5–15.5)
WBC: 3.6 10*3/uL — ABNORMAL LOW (ref 4.0–10.5)

## 2013-04-06 LAB — COMPREHENSIVE METABOLIC PANEL
ALT: 56 U/L — ABNORMAL HIGH (ref 0–53)
AST: 49 U/L — ABNORMAL HIGH (ref 0–37)
CO2: >45 mEq/L (ref 19–32)
Chloride: 83 mEq/L — ABNORMAL LOW (ref 96–112)
GFR calc non Af Amer: 32 mL/min — ABNORMAL LOW (ref >90)
Potassium: 2.7 mEq/L — CL (ref 3.5–5.1)
Sodium: 143 mEq/L (ref 135–145)
Total Bilirubin: 0.8 mg/dL (ref 0.3–1.2)

## 2013-04-06 LAB — DIFFERENTIAL
Basophils Absolute: 0 10*3/uL (ref 0.0–0.1)
Eosinophils Relative: 1 % (ref 0–5)
Lymphocytes Relative: 24 % (ref 12–46)
Monocytes Absolute: 0.5 10*3/uL (ref 0.1–1.0)
Monocytes Relative: 13 % — ABNORMAL HIGH (ref 3–12)

## 2013-04-06 MED ORDER — POTASSIUM CHLORIDE 10 MEQ/50ML IV SOLN
INTRAVENOUS | Status: AC
  Filled 2013-04-06: qty 50

## 2013-04-06 MED ORDER — FAT EMULSION 20 % IV EMUL
250.0000 mL | INTRAVENOUS | Status: AC
  Administered 2013-04-06: 250 mL via INTRAVENOUS
  Filled 2013-04-06: qty 250

## 2013-04-06 MED ORDER — POTASSIUM CHLORIDE 10 MEQ/50ML IV SOLN
10.0000 meq | INTRAVENOUS | Status: AC
  Administered 2013-04-06 – 2013-04-07 (×8): 10 meq via INTRAVENOUS
  Filled 2013-04-06: qty 400
  Filled 2013-04-06: qty 50

## 2013-04-06 MED ORDER — POTASSIUM CHLORIDE 2 MEQ/ML IV SOLN
INTRAVENOUS | Status: DC
  Administered 2013-04-06 – 2013-04-07 (×4): via INTRAVENOUS
  Filled 2013-04-06 (×10): qty 1000

## 2013-04-06 MED ORDER — SODIUM CHLORIDE 0.9 % IV BOLUS (SEPSIS)
1000.0000 mL | INTRAVENOUS | Status: DC
  Administered 2013-04-06 – 2013-04-07 (×8): 1000 mL via INTRAVENOUS

## 2013-04-06 MED ORDER — ZOLPIDEM TARTRATE 5 MG PO TABS
5.0000 mg | ORAL_TABLET | Freq: Once | ORAL | Status: AC
  Administered 2013-04-06: 5 mg via ORAL
  Filled 2013-04-06: qty 1

## 2013-04-06 MED ORDER — POTASSIUM CHLORIDE 10 MEQ/50ML IV SOLN
INTRAVENOUS | Status: AC
  Administered 2013-04-06: 10 meq via INTRAVENOUS
  Filled 2013-04-06: qty 50

## 2013-04-06 MED ORDER — TRACE MINERALS CR-CU-F-FE-I-MN-MO-SE-ZN IV SOLN
INTRAVENOUS | Status: AC
  Administered 2013-04-06: 18:00:00 via INTRAVENOUS
  Filled 2013-04-06: qty 2000

## 2013-04-06 MED ORDER — MORPHINE SULFATE 2 MG/ML IJ SOLN
1.0000 mg | INTRAMUSCULAR | Status: DC | PRN
  Administered 2013-04-06 – 2013-04-15 (×13): 1 mg via INTRAVENOUS
  Filled 2013-04-06 (×14): qty 1

## 2013-04-06 MED ORDER — SALINE SPRAY 0.65 % NA SOLN
1.0000 | NASAL | Status: DC | PRN
  Administered 2013-04-06: 1 via NASAL
  Filled 2013-04-06: qty 44

## 2013-04-06 MED ORDER — DEXTROSE 5 % IV SOLN: INTRAVENOUS | Status: DC

## 2013-04-06 MED ORDER — SODIUM CHLORIDE 0.9 % IV BOLUS (SEPSIS)
1000.0000 mL | Freq: Once | INTRAVENOUS | Status: DC
  Administered 2013-04-06: 1000 mL via INTRAVENOUS

## 2013-04-06 MED ORDER — POTASSIUM CHLORIDE 10 MEQ/100ML IV SOLN
INTRAVENOUS | Status: AC
  Filled 2013-04-06: qty 100

## 2013-04-06 MED ORDER — SODIUM CHLORIDE 0.9 % IV BOLUS (SEPSIS)
4350.0000 mL | Freq: Once | INTRAVENOUS | Status: AC
  Administered 2013-04-06: 4350 mL via INTRAVENOUS
  Filled 2013-04-06: qty 4350

## 2013-04-06 MED ORDER — INSULIN REGULAR HUMAN 100 UNIT/ML IJ SOLN
INTRAVENOUS | Status: DC
  Filled 2013-04-06: qty 2000

## 2013-04-06 MED ORDER — POTASSIUM CHLORIDE 10 MEQ/50ML IV SOLN
10.0000 meq | INTRAVENOUS | Status: AC
  Administered 2013-04-06 (×5): 10 meq via INTRAVENOUS

## 2013-04-06 NOTE — Progress Notes (Signed)
Physical Therapy Treatment Patient Details Name: [name] MRN: [id number] DOB: 10-Dec-1956 Today's Date: 04/06/2013 Time: 4132-4401 PT Time Calculation (min): 39 min  PT Assessment / Plan / Recommendation Comments on Treatment Session  pt limited by medical issues this session; motivated to do more when able;     Follow Up Recommendations  SNF;Supervision/Assistance - 24 hour     Does the patient have the potential to tolerate intense rehabilitation     Barriers to Discharge        Equipment Recommendations  None recommended by PT    Recommendations for Other Services    Frequency Min 3X/week   Plan Discharge plan remains appropriate;Frequency remains appropriate    Precautions / Restrictions Precautions Precautions: Fall Precaution Comments: multiple lines/drains   Pertinent Vitals/Pain O2 sats 100% HR90s 91/65 BP sitting  59/83 BP standing 107/67 BP after reclined   RN aware   Mobility  Bed Mobility Bed Mobility: Supine to Sit Supine to Sit: 4: Min guard;HOB elevated Details for Bed Mobility Assistance: Min/guard to ensure safety of trunk when sitting.  Pt somewhat impulsive with bed mobility.  Transfers Transfers: Sit to Stand;Stand to Sit;Stand Pivot Transfers Sit to Stand: 1: +2 Total assist;From elevated surface;With upper extremity assist;From bed Sit to Stand: Patient Percentage: 80% Stand to Sit: 1: +2 Total assist;With upper extremity assist;With armrests;To chair/3-in-1 Stand to Sit: Patient Percentage: 80% Stand Pivot Transfers: 1: +2 Total assist Stand Pivot Transfers: Patient Percentage: 80% Details for Transfer Assistance: cues for hand placement, safety; +2 for management of lines and safety;  Pt wtih decreased BP and dizziness upon standing;  repeated sit to stand trials as pt hoping for symptom improvement; after BP reading PT requireed pt to sit and RN notified Ambulation/Gait Ambulation/Gait Assistance: 1: +2 Total assist Ambulation/Gait:  Patient Percentage: 70% Ambulation Distance (Feet): 7 Feet Assistive device: Rolling walker Ambulation/Gait Assistance Details: cues for safety; limited by dizziness; pt slightly impulsive and began amb against PT advice General Gait Details: unsteady due to medical issues    Exercises Total Joint Exercises Ankle Circles/Pumps: AROM;Both;15 reps Quad Sets: AROM;Both;10 reps Heel Slides: AROM;Both;10 reps;AAROM Straight Leg Raises: AROM;Both;10 reps   PT Diagnosis:    PT Problem List:   PT Treatment Interventions:     PT Goals Acute Rehab PT Goals Time For Goal Achievement: 04/11/13 Potential to Achieve Goals: Good Pt will go Sit to Supine/Side: with supervision PT Goal: Sit to Supine/Side - Progress: Progressing toward goal Pt will go Sit to Stand: with min assist PT Goal: Sit to Stand - Progress: Progressing toward goal Pt will go Stand to Sit: with min assist PT Goal: Stand to Sit - Progress: Progressing toward goal Pt will Transfer Bed to Chair/Chair to Bed: with min assist PT Transfer Goal: Bed to Chair/Chair to Bed - Progress: Progressing toward goal Pt will Ambulate: 51 - 150 feet;with mod assist;with least restrictive assistive device PT Goal: Ambulate - Progress: Not progressing Pt will Perform Home Exercise Program: with supervision, verbal cues required/provided PT Goal: Perform Home Exercise Program - Progress: Progressing toward goal  Visit Information  Last PT Received On: 04/06/13 Assistance Needed: +2    Subjective Data      Cognition  Cognition Arousal/Alertness: Awake/alert Behavior During Therapy: Impulsive Overall Cognitive Status: No family/caregiver present to determine baseline cognitive functioning    Balance     End of Session PT - End of Session Activity Tolerance: Treatment limited secondary to medical complications (Comment) Patient left: in chair;with call  bell/phone within reach;with nursing in room Nurse Communication: Mobility status    GP     [facility] 04/06/2013, 12:36 PM

## 2013-04-06 NOTE — Progress Notes (Signed)
Subjective: I/O >>  7.9L / 9.4 L yesterday, -1490 net negative over 24 hrs. No complaints today  Objective Vital signs in last 24 hours: Filed Vitals:   04/06/13 1300 04/06/13 1400 04/06/13 1500 04/06/13 1600  BP: 88/49 87/48 100/65   Pulse: 94 93 86   Temp:    96.6 F (35.9 C)  TempSrc:    Axillary  Resp: 20 21 22    Height:      Weight:      SpO2: 100% 99% 96%    Weight change:   Intake/Output Summary (Last 24 hours) at 04/06/13 1646 Last data filed at 04/06/13 1634  Gross per 24 hour  Intake 14924.5 ml  Output  96045 ml  Net 4499.5 ml   Labs: Basic Metabolic Panel:  Recent Labs Lab 04/03/13 0600  04/04/13 0441  04/04/13 2230 04/05/13 0750 04/05/13 0930 04/05/13 1115 04/05/13 2045 04/06/13 0400  NA 147*  --  146*  < > 147* 147* 146*  --   --  143  K 2.9*  < > 2.6*  < > 2.8* 3.3* 3.2* 3.3* 2.7* 2.7*  CL 94*  --  89*  < > 84* 82* 81*  --   --  83*  CO2 44*  --  >45*  < > >45* >45* >45*  --   --  >45*  GLUCOSE 129*  --  170*  < > 157* 173* 140*  --   --  109*  BUN 68*  --  61*  < > 67* 75* 76*  --   --  77*  CREATININE 1.21  --  1.23  < > 1.64* 1.92* 1.96*  --   --  2.16*  CALCIUM 8.9  --  9.8  < > 10.1 10.0 10.1  --   --  9.0  PHOS 5.5*  --  5.5*  --   --   --   --   --   --  6.3*  < > = values in this interval not displayed. Liver Function Tests:  Recent Labs Lab 04/04/13 0441 04/05/13 0750 04/06/13 0400  AST 41* 66* 49*  ALT 55* 71* 56*  ALKPHOS 336* 380* 289*  BILITOT 0.5 0.8 0.8  PROT 6.9 7.2 5.8*  ALBUMIN 2.6* 2.6* 2.1*   No results found for this basename: LIPASE, AMYLASE,  in the last 168 hours No results found for this basename: AMMONIA,  in the last 168 hours CBC:  Recent Labs Lab 04/03/13 0230 04/03/13 0600 04/04/13 0441 04/06/13 0400  WBC 4.1 2.3* 3.2* 3.6*  NEUTROABS 2.1 1.3* 1.9 2.2  HGB 16.9 9.6* 10.0* 8.8*  HCT 49.4 29.7* 30.9* 27.0*  MCV 95.7 97.7 98.7 99.3  PLT 263 274 316 251   PT/INR: @LABRCNTIP (inr:5)   Scheduled  Meds ) . antiseptic oral rinse  15 mL Mouth Rinse q12n4p  . chlorhexidine  15 mL Mouth Rinse BID  . Chlorhexidine Gluconate Cloth  6 each Topical Q0600  . clindamycin  1 application Topical BID  . enoxaparin (LOVENOX) injection  40 mg Subcutaneous Q24H  . fludrocortisone  0.1 mg Oral BID  . insulin aspart  0-15 Units Subcutaneous Q4H  . midodrine  10 mg Oral TID  . mupirocin ointment  1 application Nasal BID  . pantoprazole  40 mg Oral q morning - 10a  . potassium chloride      . potassium chloride      . potassium chloride      . potassium chloride      .  potassium chloride      . pregabalin  50 mg Oral TID  . sodium chloride  1,000 mL Intravenous Q4H  . sodium chloride  4,350 mL Intravenous Once  . sodium chloride  3 mL Intravenous Q12H  . vitamin C  500 mg Oral q morning - 10a  . zinc sulfate  220 mg Oral q morning - 10a    Physical Exam:  Blood pressure 100/65, pulse 86, temperature 96.6 F (35.9 C), temperature source Axillary, resp. rate 22, height 6\' 4"  (1.93 m), weight 78 kg (171 lb 15.3 oz), SpO2 96.00%.  Gen: alert, thin AAM no distress  Skin: no rash, cyanosis  HEENT: EOMI, sclera anicteric, throat dry  Neck: no JVD, no LAN  Chest: clear throughout  CV: regular, no rub or gallop, no carotid or femoral bruits, pedal pulses intact  Abdomen: soft, mid abdomen ileostomy, mid abd open wound with bag in place draining, LUQ small bore PEG tube hooked to suction, no ascites  Ext: no edema x 4 ext, no joint effusion  Neuro: alert, Ox3, no focal deficit   Impression/Plan  1. Hypernatremia, hypovolemic- was still 2L negative yesterday. I've ordered cc:cc replacement of GI and renal losses, + getting another 4L of NS in boluses over 24hr (per Dr Blake Divine) which should improve hypovolemia and hopefully improve BP and renal function. Also getting D5W for hypernatremia which is improved.   2. Hypochloremic met alkalosis- due to chronic high volume gastric suction. Attempting to  replete chloride with NaCL and KCL 3. AKI-  due to hypotension/ volume depletion 4. Metastatic peritoneal carcinomatosis- had signet and renal features    Vinson Moselle  MD [phone] pgr    [phone] cell 04/06/2013, 4:46 PM

## 2013-04-06 NOTE — Progress Notes (Signed)
[facility] PROGRESS NOTE  [name] NUU:[id number] DOB: 02/24/1956 DOA: 04/03/2013 PCP: [name], MD  Assessment/Plan: 1. Hypotension from dehydration secondary to nausea and vomiting - Nausea and vomiting resolved. But his BP continues to run borderline. He remains asymptomatic all along.  2. Acute renal failure/ metabolic alkalosis  -  Improved, secondary to ongoing GI loses . closely follow intake output and metabolic panel. Renal consult requested to assist with the management of alkalosis. He is on Iv normal saline 1 liter every 4 hours and dextrose fluids for his hypernatremia. His hypokalemia is persistent.  3. Abdominal carcinomatosis - Treatment per oncologist. Patient presently follows at [facility] wit Dr Leavy Cella and he was recently transferred for chemo to Dr Bonney Aid. Tried to reach Dr Bonney Aid, but found out that the patient never had chemo under Dr Bonney Aid. Will try to reach Dr Leavy Cella tomorrow to talk about his electrolyte status and his prognosis.  4. On TPN - pharmacy to assist. 5. History of gout - presently not having any flares. 6. Hypokalemia: replete as needed.  7. Nutrition on TPN and clear liquid diet.  8. DVT prophylaxis.   HPI/Subjective: Feeling better no new complaints.   Objective: Filed Vitals:   04/06/13 0500 04/06/13 0600 04/06/13 0617 04/06/13 0800  BP: 79/55 65/39 82/44  90/63  Pulse: 83 86  90  Temp:      TempSrc:      Resp: 15 19  19   Height:      Weight:      SpO2: 100% 100%  100%    Intake/Output Summary (Last 24 hours) at 04/06/13 0831 Last data filed at 04/06/13 0800  Gross per 24 hour  Intake 8228.25 ml  Output   8925 ml  Net -696.75 ml   Filed Weights   04/03/13 0500 04/04/13 0000 04/05/13 0500  Weight: 77.3 kg (170 lb 6.7 oz) 76.4 kg (168 lb 6.9 oz) 78 kg (171 lb 15.3 oz)    Exam:  General: Well-developed and poorly nourished.  Cardiovascular: S1-S2 heard tachycardic.  Respiratory: No rhonchi or  crepitations.  Abdomen: colostomy pouch and suction tube in place.  Skin: No new rash.  Musculoskeletal: No edema.   Neurologic: Moves all extremities.   Data Reviewed: Basic Metabolic Panel:  Recent Labs Lab 04/03/13 0600  04/04/13 0441 04/04/13 1345 04/04/13 2230 04/05/13 0750 04/05/13 0930 04/05/13 1115 04/05/13 2045 04/06/13 0400  NA 147*  --  146* 147* 147* 147* 146*  --   --  143  K 2.9*  < > 2.6* 2.6* 2.8* 3.3* 3.2* 3.3* 2.7* 2.7*  CL 94*  --  89* 87* 84* 82* 81*  --   --  83*  CO2 44*  --  >45* >45* >45* >45* >45*  --   --  >45*  GLUCOSE 129*  --  170* 147* 157* 173* 140*  --   --  109*  BUN 68*  --  61* 65* 67* 75* 76*  --   --  77*  CREATININE 1.21  --  1.23 1.36* 1.64* 1.92* 1.96*  --   --  2.16*  CALCIUM 8.9  --  9.8 10.3 10.1 10.0 10.1  --   --  9.0  MG 2.2  --  2.2  --   --   --   --   --   --  1.9  PHOS 5.5*  --  5.5*  --   --   --   --   --   --  6.3*  < > = values in this interval not displayed. Liver Function Tests:  Recent Labs Lab 04/03/13 0230 04/03/13 0600 04/04/13 0441 04/05/13 0750 04/06/13 0400  AST 54* 42* 41* 66* 49*  ALT 70* 58* 55* 71* 56*  ALKPHOS 401* 335* 336* 380* 289*  BILITOT 0.6 0.6 0.5 0.8 0.8  PROT 7.9 6.7 6.9 7.2 5.8*  ALBUMIN 2.7* 2.5* 2.6* 2.6* 2.1*   No results found for this basename: LIPASE, AMYLASE,  in the last 168 hours No results found for this basename: AMMONIA,  in the last 168 hours CBC:  Recent Labs Lab 04/03/13 0230 04/03/13 0600 04/04/13 0441 04/06/13 0400  WBC 4.1 2.3* 3.2* 3.6*  NEUTROABS 2.1 1.3* 1.9 2.2  HGB 16.9 9.6* 10.0* 8.8*  HCT 49.4 29.7* 30.9* 27.0*  MCV 95.7 97.7 98.7 99.3  PLT 263 274 316 251   Cardiac Enzymes:  Recent Labs Lab 04/03/13 0230  TROPONINI <0.30   BNP (last 3 results)  Recent Labs  12/22/12 1700  PROBNP 33.8   CBG:  Recent Labs Lab 04/05/13 1512 04/05/13 1936 04/06/13 0022 04/06/13 0423 04/06/13 0800  GLUCAP 79 148* 127* 113* 144*    Recent Results  (from the past 240 hour(s))  CULTURE, BLOOD (ROUTINE X 2)     Status: None   Collection Time    04/03/13  3:00 AM      Result Value Range Status   Specimen Description BLOOD LEFT HAND   Final   Special Requests BOTTLES DRAWN AEROBIC AND ANAEROBIC 5CC   Final   Culture  Setup Time 04/03/2013 08:32   Final   Culture     Final   Value:        BLOOD CULTURE RECEIVED NO GROWTH TO DATE CULTURE WILL BE HELD FOR 5 DAYS BEFORE ISSUING A FINAL NEGATIVE REPORT   Report Status PENDING   Incomplete  CULTURE, BLOOD (ROUTINE X 2)     Status: None   Collection Time    04/03/13  3:05 AM      Result Value Range Status   Specimen Description BLOOD RIGHT HAND   Final   Special Requests BOTTLES DRAWN AEROBIC AND ANAEROBIC 5CC   Final   Culture  Setup Time 04/03/2013 08:32   Final   Culture     Final   Value:        BLOOD CULTURE RECEIVED NO GROWTH TO DATE CULTURE WILL BE HELD FOR 5 DAYS BEFORE ISSUING A FINAL NEGATIVE REPORT   Report Status PENDING   Incomplete  URINE CULTURE     Status: None   Collection Time    04/03/13  3:45 AM      Result Value Range Status   Specimen Description URINE, CLEAN CATCH   Final   Special Requests NONE   Final   Culture  Setup Time 04/03/2013 08:39   Final   Colony Count NO GROWTH   Final   Culture NO GROWTH   Final   Report Status 04/04/2013 FINAL   Final  MRSA PCR SCREENING     Status: Abnormal   Collection Time    04/03/13  5:00 AM      Result Value Range Status   MRSA by PCR POSITIVE (*) NEGATIVE Final   Comment:            The GeneXpert MRSA Assay (FDA     approved for NASAL specimens     only), is one component of a     comprehensive  MRSA colonization     surveillance program. It is not     intended to diagnose MRSA     infection nor to guide or     monitor treatment for     MRSA infections.     RESULT CALLED TO, READ BACK BY AND VERIFIED WITH:     C.CAGLE AT 0743 ON [id number] BY C.BONGEL     Studies: No results found.  Scheduled Meds: . antiseptic  oral rinse  15 mL Mouth Rinse q12n4p  . chlorhexidine  15 mL Mouth Rinse BID  . Chlorhexidine Gluconate Cloth  6 each Topical Q0600  . clindamycin  1 application Topical BID  . enoxaparin (LOVENOX) injection  40 mg Subcutaneous Q24H  . fludrocortisone  0.1 mg Oral BID  . insulin aspart  0-15 Units Subcutaneous Q4H  . midodrine  10 mg Oral TID  . mupirocin ointment  1 application Nasal BID  . pantoprazole  40 mg Oral q morning - 10a  . potassium chloride  10 mEq Intravenous Q1 Hr x 6  . potassium chloride      . potassium chloride      . potassium chloride      . potassium chloride      . potassium chloride      . pregabalin  50 mg Oral TID  . sodium chloride  3 mL Intravenous Q12H  . vitamin C  500 mg Oral q morning - 10a  . zinc sulfate  220 mg Oral q morning - 10a   Continuous Infusions: . Marland KitchenTPN (CLINIMIX-E) Adult 83 mL/hr at 04/05/13 1737  . sodium chloride 250 mL/hr at 04/06/13 0648  . dextrose 75 mL/hr at 04/05/13 1850    Principal Problem:   Dehydration secondary to nausea and vomiting Active Problems:   Abdominal malignant neoplasm   Acute kidney injury secondary to dehydration        [facility]  [facility] Pager [phone]. If 7PM-7AM, please contact night-coverage at [email or web address], password [facility] 04/06/2013, 8:31 AM  LOS: 3 days

## 2013-04-06 NOTE — Progress Notes (Signed)
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 [name] which     Action/Plan:   tbd will follow for dc needs.   Anticipated DC Date:  04/09/2013   Anticipated DC Plan:  SKILLED NURSING FACILITY  In-house referral  Clinical Social Worker      DC Planning Services  NA      [facility] Choice  NA   Choice offered to / List presented to:  NA   DME arranged  NA      DME agency  NA     HH arranged  NA      HH agency  NA   Status of service:  In process, will continue to follow Medicare Important Message given?  NA - LOS <3 / Initial given by admissions (If response is "NO", the following Medicare IM given date fields will be blank) Date Medicare IM given:   Date Additional Medicare IM given:    Discharge Disposition:    Per UR Regulation:  Reviewed for med. necessity/level of care/duration of stay  If discussed at Long Length of Stay Meetings, dates discussed:    Comments:  [id number] Earlene Plater, RN, BSN, CCM:  CHART REVIEWED AND UPDATED.  Next chart review due on [id number]. NO DISCHARGE NEEDS PRESENT AT THIS TIME. CASE MANAGEMENT [phone]   [id number] Earlene Plater, RN, BSN, CCM:  CHART REVIEWED AND UPDATED.  Next chart review due on [id number]. NO DISCHARGE NEEDS PRESENT AT THIS TIME. CASE MANAGEMENT [phone]

## 2013-04-06 NOTE — Progress Notes (Signed)
Brief Pharmacy Consult Note:  See previous TNA notes from pharmacist Perlie Gold for full details.  Repeat K+ is 2.8 following 9 runs of KCl since midnight.  TNA tonight will not contain any K+ due to elevated phosphorus.  Plan:  KCl x 8 more runs tonight  Follow up in am  Loralee Pacas, PharmD, BCPS 04/06/2013 5:19 PM

## 2013-04-06 NOTE — Progress Notes (Signed)
PARENTERAL NUTRITION CONSULT NOTE - FOLLOW UP  Pharmacy Consult for TPN Indication: enterocutaneous fistula, abdominal carcinomatosis  Allergies  Allergen Reactions  . Percocet (Oxycodone-Acetaminophen) Nausea And Vomiting    hallucination   Patient Measurements: Height: 6\' 4"  (193 cm) Weight: 171 lb 15.3 oz (78 kg) IBW/kg (Calculated) : 86.8 Usual Weight: 170-180 lbs  Vital Signs: Temp: 96.9 F (36.1 C) (04/21 0800) Temp src: Axillary (04/21 0800) BP: 89/63 mmHg (04/21 1000) Pulse Rate: 91 (04/21 1000) Intake/Output from previous day: 04/20 0701 - 04/21 0700 In: 7928.3 [P.O.:600; I.V.:4566.3; IV Piggyback:700; TPN:1992] Out: 9425 [Urine:1225; Drains:8200] Intake/Output from this shift: Total I/O In: 2157 [I.V.:675; IV Piggyback:1150; TPN:332] Out: 450 [Urine:400; Drains:50]  Labs:  Recent Labs  04/04/13 0441 04/06/13 0400  WBC 3.2* 3.6*  HGB 10.0* 8.8*  HCT 30.9* 27.0*  PLT 316 251    Recent Labs  04/04/13 0441 04/04/13 0442  04/05/13 0750 04/05/13 0930 04/05/13 1115 04/05/13 2045 04/06/13 0400  NA 146*  --   < > 147* 146*  --   --  143  K 2.6*  --   < > 3.3* 3.2* 3.3* 2.7* 2.7*  CL 89*  --   < > 82* 81*  --   --  83*  CO2 >45*  --   < > >45* >45*  --   --  >45*  GLUCOSE 170*  --   < > 173* 140*  --   --  109*  BUN 61*  --   < > 75* 76*  --   --  77*  CREATININE 1.23  --   < > 1.92* 1.96*  --   --  2.16*  CALCIUM 9.8  --   < > 10.0 10.1  --   --  9.0  MG 2.2  --   --   --   --   --   --  1.9  PHOS 5.5*  --   --   --   --   --   --  6.3*  PROT 6.9  --   --  7.2  --   --   --  5.8*  ALBUMIN 2.6*  --   --  2.6*  --   --   --  2.1*  AST 41*  --   --  66*  --   --   --  49*  ALT 55*  --   --  71*  --   --   --  56*  ALKPHOS 336*  --   --  380*  --   --   --  289*  BILITOT 0.5  --   --  0.8  --   --   --  0.8  PREALBUMIN 33.0  --   --   --   --   --   --   --   TRIG  --  195*  --   --   --   --   --  177*  CHOL  --  181  --   --   --   --   --  153  <  > = values in this interval not displayed. Estimated Creatinine Clearance: 42.1 ml/min (by C-G formula based on Cr of 2.16).    Recent Labs  04/06/13 0022 04/06/13 0423 04/06/13 0800  GLUCAP 127* 113* 144*    Insulin Requirements in the past 24 hours:  6 units of SSI + 30 units of regular insulin provided  in the TNA (E 5/20 formula)  Current Nutrition:  Clinimix E5/20 at 83 ml/hr + lipids at 10 ml/hr MWF. IVF: D5W with 40 mEq KCl/liter at 100 ml/hr and NS bolus q4 hr equivalent to 242ml/hr  PTA TPN containing 160g protein and 368g dextrose with 41 units of regular insulin cycled over 18 hours with 50g lipids given over 12 hours for 2341 kcal/day total (total volume 2300 mL).  Nutritional Goals:  RD recommendations (4/18): Kcal: 2220-2565 Protein: 100-132 grams Fluid: 2.3-2.6 L   Can achieve these nutrition goals with Clinimix E5/20 at a goal rate of 100 ml/hr + IVFE 20% at 10 ml/hr on MWF to provide: 120g/day protein, 2318 Kcal/day avg.(2112 Kcal/day STTHS, 2592 Kcal/day MWF).  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 Discharged from [facility] recently and was transferred to his nursing home in [place] started developing N/V and weakness. Was receiving TPN cycled over 18 hours at [facility]. Will continue TPN inpatient but will be difficult to provide large protein and kcal amounts with premixed Clinimix TNAs.  RD evaluated the patient 4/18 and recommended a reduced protein goal than PTA.  Pharmacy will be able to meet new nutrition goals with the premixed Clinimix product.  Will provide TNA continuously  inpatient due to high volume that needs to be administered with premixed product.  Labs:  Glucose: CBGs (79-127) improved with increased insulin in TNA (20 units/L), Novolog scale to moderate yesterday.  Electrolytes: K+ remains low as patient continues to have extreme GI losses.  Continues to get replacements throughout the day.  Not concerned for refeeding since Phos level is high and Mag is WNL.  Will continue to provide TNA and monitor/replace K+.  CO2 high - metabolic alkalosis, again pt with extreme GI losses  LFTs: AST/ALT elevated at 66/71; Alk Phos remains elevated  TGs: 195 (4/19), 177 (4/21)  Prealbumin: 33 (4/19), WNL  Renal: SCr elevated on admission d/t dehydration but continues to increase as patient continues to lose large amounts of output from drains (>8L/day).  Of note, pt was receiving high amounts of protein in TNA PTA (160g/day).  With increasing SCr, do not want to exacerbate by providing excessive protein parenterally so will maintain protein at ~1.2g/kg/day = 100 g/day which is currently being provided with Clinimix E5/20 at 83 ml/hr.  Plan:  At 1800 today:  Change Clinimix to no electrolyte formula 5/15 at 83 ml/hr for accumulating Phosphorus.  At this rate, we are currently meeting our protein goal by providing 96g of protein per day.  Will not advance to meet kcal goal d/t worsening SCr. Receiving KCl boluses daily Decrease regular insulin in TNA to 15 units/L with formula containing decreased Dextrose.  Fat emulsion at 10 ml/hr (MWF only due to ongoing shortage).  TNA to contain standard multivitamins and trace elements (MWF only due to ongoing shortage).  MIVF per MD. Changed to D5W with 40 mEq KCl/L at 139ml/hr + NS 1000 ml q 4 hr TNA lab panels on Mondays & Thursdays.  F/u K+ this afternoon. BMET daily at 1400  Clance Boll, PharmD, BCPS Pager: [phone] 04/06/2013 11:38 AM  Addendum: 04/06/2013 11:38 AM K+ 3.3  Plan: KCl 10 mEq/100 mL IV x 3 more  runs.  Check another K+ at 2000.  Clance Boll, PharmD, BCPS Pager: [phone] 04/06/2013 11:38 AM

## 2013-04-06 NOTE — Clinical Social Work Psychosocial (Signed)
Clinical Social Work Department BRIEF PSYCHOSOCIAL ASSESSMENT 04/06/2013  Patient:  [name], [name]     Account Number:  [id number]     Admit date:  04/03/2013  Clinical Social Worker:  Jodelle Red  Date/Time:  04/06/2013 12:02 PM  Referred by:  CSW  Date Referred:  04/06/2013 Referred for  SNF Placement   Other Referral:   admitted from snf   Interview type:  Patient Other interview type:   chart review, discussion with RN    PSYCHOSOCIAL DATA Living Status:  FACILITY Admitted from facility:  [facility] Level of care:  Skilled Nursing Facility Primary support name:  Hayward Rylander Primary support relationship to patient:  SPOUSE Degree of support available:   good from wife and extended family.    CURRENT CONCERNS Current Concerns  Post-Acute Placement   Other Concerns:   admitted from SNF    SOCIAL WORK ASSESSMENT / PLAN CSW reviewed chart and found Pt was admitted from SNF. He was only at [facility] for one day per his report and is open to returning, but is not totally sure he will go back. He later stated he was fine to return to [facility], but still seemed undecided. Pt shared concerns about his chemo and would like to receive chemo here locally if possible. He feels this is helping him get better. He stated he "wants to fight.".  Pt shared all his family is here locally and it has been difficult to be away from them in [place] to get his chemo. He is hopeful that he may be able to receive local care for his cancer. Per RN, his MD is aware of this concern.   Assessment/plan status:  Psychosocial Support/Ongoing Assessment of Needs Other assessment/ plan:   assist with possible return to SNF.   Information/referral to community resources:    PATIENT'S/FAMILY'S RESPONSE TO PLAN OF CARE: Pt appreciative of CSW visit and is open to returning to [facility]. CSW explained only a few SNFs do TNA and Pt stated understanding. Pt very hopeful and practically  begged CSW to assist with transfer of his care to a local MD. CSW explained her role and limitations. CSW did share this concern with RN who stated MD was aware. CSW will update FL2 for possible return to [facility] SNF and reassess in the am.     Doreen Salvage, LCSW ICU/Stepdown Clinical Social Worker [facility] Cell [phone] Hours 8am-1200pm M-F

## 2013-04-07 LAB — BASIC METABOLIC PANEL
CO2: 38 mEq/L — ABNORMAL HIGH (ref 19–32)
Calcium: 7.2 mg/dL — ABNORMAL LOW (ref 8.4–10.5)
Chloride: 102 mEq/L (ref 96–112)
Glucose, Bld: 104 mg/dL — ABNORMAL HIGH (ref 70–99)
Potassium: 2.8 mEq/L — ABNORMAL LOW (ref 3.5–5.1)
Sodium: 144 mEq/L (ref 135–145)

## 2013-04-07 LAB — GLUCOSE, CAPILLARY
Glucose-Capillary: 102 mg/dL — ABNORMAL HIGH (ref 70–99)
Glucose-Capillary: 117 mg/dL — ABNORMAL HIGH (ref 70–99)
Glucose-Capillary: 123 mg/dL — ABNORMAL HIGH (ref 70–99)
Glucose-Capillary: 62 mg/dL — ABNORMAL LOW (ref 70–99)
Glucose-Capillary: 73 mg/dL (ref 70–99)
Glucose-Capillary: 98 mg/dL (ref 70–99)

## 2013-04-07 LAB — PHOSPHORUS: Phosphorus: 3 mg/dL (ref 2.3–4.6)

## 2013-04-07 MED ORDER — POTASSIUM CHLORIDE 10 MEQ/50ML IV SOLN
10.0000 meq | INTRAVENOUS | Status: AC
  Administered 2013-04-07 (×8): 10 meq via INTRAVENOUS
  Filled 2013-04-07 (×2): qty 200

## 2013-04-07 MED ORDER — SODIUM CHLORIDE 0.9 % IV BOLUS (SEPSIS)
1000.0000 mL | Freq: Two times a day (BID) | INTRAVENOUS | Status: DC
  Administered 2013-04-07 – 2013-04-08 (×2): 1000 mL via INTRAVENOUS

## 2013-04-07 MED ORDER — POTASSIUM CHLORIDE IN NACL 20-0.45 MEQ/L-% IV SOLN
INTRAVENOUS | Status: DC
  Filled 2013-04-07 (×3): qty 1000

## 2013-04-07 MED ORDER — SODIUM CHLORIDE 0.9 % IV BOLUS (SEPSIS)
3350.0000 mL | Freq: Once | INTRAVENOUS | Status: AC
  Administered 2013-04-07: 3350 mL via INTRAVENOUS

## 2013-04-07 MED ORDER — DEXTROSE 50 % IV SOLN
INTRAVENOUS | Status: AC
  Administered 2013-04-07: 25 mL via INTRAVENOUS
  Filled 2013-04-07: qty 50

## 2013-04-07 MED ORDER — POTASSIUM CHLORIDE IN NACL 20-0.9 MEQ/L-% IV SOLN
INTRAVENOUS | Status: AC
  Administered 2013-04-07: 1000 mL
  Filled 2013-04-07: qty 3000

## 2013-04-07 MED ORDER — INSULIN REGULAR HUMAN 100 UNIT/ML IJ SOLN
INTRAVENOUS | Status: AC
  Administered 2013-04-07: 17:00:00 via INTRAVENOUS
  Filled 2013-04-07: qty 2000

## 2013-04-07 MED ORDER — POTASSIUM CHLORIDE 10 MEQ/50ML IV SOLN
10.0000 meq | INTRAVENOUS | Status: AC
  Administered 2013-04-07 – 2013-04-08 (×6): 10 meq via INTRAVENOUS
  Filled 2013-04-07 (×7): qty 50

## 2013-04-07 MED ORDER — MORPHINE SULFATE 2 MG/ML IJ SOLN
2.0000 mg | Freq: Once | INTRAMUSCULAR | Status: AC
  Administered 2013-04-07: 2 mg via INTRAVENOUS

## 2013-04-07 MED ORDER — SODIUM CHLORIDE 0.9 % IV BOLUS (SEPSIS): 1000.0000 mL | Freq: Three times a day (TID) | INTRAVENOUS | Status: DC

## 2013-04-07 MED ORDER — MAGNESIUM SULFATE 40 MG/ML IJ SOLN
2.0000 g | Freq: Once | INTRAMUSCULAR | Status: AC
  Administered 2013-04-07: 2 g via INTRAVENOUS
  Filled 2013-04-07: qty 50

## 2013-04-07 MED ORDER — SODIUM CHLORIDE 0.9 % IV SOLN
INTRAVENOUS | Status: AC
  Administered 2013-04-07: 16:00:00 via INTRAVENOUS

## 2013-04-07 NOTE — Progress Notes (Signed)
IP PROGRESS NOTE  Subjective:   57 year old known to me with metastatic adenocarcinoma and peritoneal carcinomatosis status post extensive debulking procedures and abdominal surgeries by Dr. [name] which has left him with multiple drains and fistulas, on chronic TPN.    I was consulted on him back in 12/2012 and did not think he is a candidate for any chemotherapy.   He did receive chemotherapy at an outside facility (? [facility]) and now admitted again with severe dehydration and metabolic derangement.     Objective:  Vital signs in last 24 hours: Temp:  [96.6 F (35.9 C)-98.5 F (36.9 C)] 97.9 F (36.6 C) (04/22 0800) Pulse Rate:  [72-94] 80 (04/22 1200) Resp:  [15-32] 19 (04/22 1200) BP: (87-118)/(48-76) 108/58 mmHg (04/22 1200) SpO2:  [96 %-100 %] 100 % (04/22 1200) Weight:  [188 lb 7.9 oz (85.5 kg)] 188 lb 7.9 oz (85.5 kg) (04/22 0500) Weight change:  Last BM Date:  (no output from ileostomy bag)  Intake/Output from previous day: 04/21 0701 - 04/22 0700 In: 19584.7 [I.V.:2675; IV Piggyback:14300; TPN:2609.7] Out: 9030 [Urine:2975; Drains:6055]  Mouth: mucous membranes moist, pharynx normal without lesions Resp: clear to auscultation bilaterally Cardio: regular rate and rhythm, S1, S2 normal, no murmur, click, rub or gallop GI: soft, non-tender; bowel sounds normal; no masses,  no organomegaly Extremities: extremities normal, atraumatic, no cyanosis or edema    Lab Results:  Recent Labs  04/06/13 0400  WBC 3.6*  HGB 8.8*  HCT 27.0*  PLT 251    BMET  Recent Labs  04/06/13 1600 04/07/13 0500  NA 143 144  K 2.8* 2.8*  CL 86* 102  CO2 >45* 38*  GLUCOSE 113* 104*  BUN 72* 55*  CREATININE 2.16* 1.79*  CALCIUM 8.9 7.2*    Studies/Results: US Renal  04/06/2013  *RADIOLOGY REPORT*  Clinical Data: Renal insufficiency and history of peritoneal carcinomatosis.  RENAL/URINARY TRACT ULTRASOUND COMPLETE  Comparison:  CT of the abdomen on 12/16/2012.   Findings:  Right Kidney:  The right kidney measures approximately 12.3 cm in length.  Sonographic appearance is normal without evidence of hydronephrosis or focal lesion.  Left Kidney:  The left kidney measures approximately 13 cm in length and has a normal sonographic appearance without evidence of hydronephrosis.  Small cyst of the mid kidney measures 1.2 cm has a benign appearance.  Bladder:  The bladder was mildly distended at the time imaging and unremarkable in appearance.  IMPRESSION: Unremarkable renal ultrasound.  No evidence of hydronephrosis bilaterally.   Original Report Authenticated By: Irish Lack, M.D.     Medications: I have reviewed the patient's current medications.  Assessment/Plan:  57 year old with etastatic adenocarcinoma and peritoneal carcinomatosis status post extensive debulking procedures and abdominal surgeries.   He is not a candidate for any systemic chemotherapy given he severe metabolic abnormalities.   Despite the fact he had chemotherapy in the last few weeks, it is clear that caused him more problems with dehydration and likely will offer little to no palliation.   I recommend :  Continuing supportive care like you are doing.  I do not think he should get any more chemotherapy (while he in the hospital). When he gets batter and gets discharged, he can follow up with his established Oncologist and can consider treating him with chemotherapy if felt appropriate.     LOS: 4 days   Jenette Rayson 04/07/2013, 1:06 PM

## 2013-04-07 NOTE — Clinical Social Work Note (Addendum)
CSW updated Pt's FL2 and sent clinicals to [facility]. CSW will continue to follow for return to SNF.  FL2 signed and in chart. CSW spoke with MD and RN this am.   Doreen Salvage, LCSW ICU/Stepdown Clinical Social Worker [facility] Cell [phone] Hours 8am-1200pm M-F

## 2013-04-07 NOTE — Progress Notes (Signed)
PARENTERAL NUTRITION CONSULT NOTE - FOLLOW UP  Pharmacy Consult for TPN Indication: enterocutaneous fistula, abdominal carcinomatosis  Allergies  Allergen Reactions  . Percocet (Oxycodone-Acetaminophen) Nausea And Vomiting    hallucination   Patient Measurements: Height: 6\' 4"  (193 cm) Weight: 188 lb 7.9 oz (85.5 kg) IBW/kg (Calculated) : 86.8 Usual Weight: 170-180 lbs  Vital Signs: Temp: 98.5 F (36.9 C) (04/22 0400) Temp src: Oral (04/22 0400) BP: 108/59 mmHg (04/22 0700) Pulse Rate: 77 (04/22 0700) Intake/Output from previous day: 04/21 0701 - 04/22 0700 In: 19391.7 [I.V.:2575; IV Piggyback:14300; TPN:2516.7] Out: 9030 [Urine:2975; Drains:6055] Intake/Output from this shift:    Labs:  Recent Labs  04/06/13 0400  WBC 3.6*  HGB 8.8*  HCT 27.0*  PLT 251    Recent Labs  04/05/13 0750  04/06/13 0400 04/06/13 1600 04/07/13 0500  NA 147*  < > 143 143 144  K 3.3*  < > 2.7* 2.8* 2.8*  CL 82*  < > 83* 86* 102  CO2 >45*  < > >45* >45* 38*  GLUCOSE 173*  < > 109* 113* 104*  BUN 75*  < > 77* 72* 55*  CREATININE 1.92*  < > 2.16* 2.16* 1.79*  CALCIUM 10.0  < > 9.0 8.9 7.2*  MG  --   --  1.9  --  1.3*  PHOS  --   --  6.3*  --  3.0  PROT 7.2  --  5.8*  --   --   ALBUMIN 2.6*  --  2.1*  --   --   AST 66*  --  49*  --   --   ALT 71*  --  56*  --   --   ALKPHOS 380*  --  289*  --   --   BILITOT 0.8  --  0.8  --   --   PREALBUMIN  --   --  28.2  --   --   TRIG  --   --  177*  --   --   CHOL  --   --  153  --   --   < > = values in this interval not displayed. Estimated Creatinine Clearance: 55.7 ml/min (by C-G formula based on Cr of 1.79).    Recent Labs  04/06/13 2029 04/06/13 2358 04/07/13 0318  GLUCAP 106* 117* 123*    Insulin Requirements in the past 24 hours:  4 units of SSI + 15 units of regular insulin per liter provided in the TNA (5/15 formula)  Current Nutrition:  Clinimix 5/15 at 83 ml/hr + lipids at 10 ml/hr MWF. IVF: D5W with 40 mEq  KCl/liter at 100 ml/hr and NS bolus q4 hr equivalent to 229ml/hr. NS cc per cc replacement of GI and renal losses per Nephrology  PTA TPN containing 160g protein and 368g dextrose with 41 units of regular insulin cycled over 18 hours with 50g lipids given over 12 hours for 2341 kcal/day total (total volume 2300 mL).  Nutritional Goals:  RD recommendations (4/18): Kcal: 2220-2565 Protein: 100-132 grams Fluid: 2.3-2.6 L   Can achieve these nutrition goals with Clinimix E5/20 at a goal rate of 100 ml/hr + IVFE 20% at 10 ml/hr on MWF to provide: 120g/day protein, 2318 Kcal/day avg.(2112 Kcal/day STTHS, 2592 Kcal/day MWF).    Assessment:  23 yoM with complicated PMH of metastatic adenocarcinoma, peritoneal carcinomatosis s/p extensive debulking procedures and abdominal surgeries which has left him with multiple drains and fistulas on chronic TPN via a PICC  line. Discharged from [facility] recently and was transferred to his nursing home in [place] started developing N/V and weakness. Was receiving TPN cycled over 18 hours at [facility]. Will continue TPN inpatient but will be difficult to provide large protein and kcal amounts with premixed Clinimix TNAs.  RD evaluated the patient 4/18 and recommended a reduced protein goal than PTA.  Pharmacy will be able to meet new nutrition goals with the premixed Clinimix product.  Will provide TNA continuously inpatient due to high volume that needs to be administered with premixed product.  Labs:  Glucose: CBGs at goal < 150  Electrolytes: K+ remains low as patient continues to have extreme GI losses. CO2 high but improved - metabolic alkalosis with extreme GI losses. Phos elevated yesterday, but now WNL after removing electrolytes from TNA formula on 4/21. Mg low.  LFTs: AST/ALT slightly elevated, Alk Phos remains elevated (4/21)  TGs: 195 (4/19), 177 (4/21)  Prealbumin: 33 (4/19), 28.2 (4/21)  Renal: ARF from hypotension/volume depletion; SCr now  improving  Plan:  At 1800 today:  Change Clinimix back to electrolyte-containing formula (E5/20) at 83 ml/hr  KCl IV x 8 runs today Magnesium sulfate 2gm IV today Adjust regular insulin in TNA to 20 units/L with formula change.  Fat emulsion at 10 ml/hr (MWF only due to ongoing shortage).  TNA to contain standard multivitamins and trace elements (MWF only due to ongoing shortage). Also vitamin C and zinc continued from home. MIVF per MD.  TNA lab panels on Mondays & Thursdays.    Loralee Pacas, PharmD, BCPS Pager: [phone]  04/07/2013 7:35 AM

## 2013-04-07 NOTE — Progress Notes (Signed)
Brief Pharmacy Consult Note:  See previous TNA notes from pharmacist earlier today for full details.  Repeat K+ is 3.0 following 10 runs of KCl given since midnight in addition to the KCl provided by the TNA (60 mEq/24 hours) and IVF (d5W with 40 mEq KCl providing ~48 mEq KCl/24 hours).  Plan:  KCl x 6 more runs tonight  Follow up AM labs  Clance Boll, PharmD, BCPS Pager: [phone] 04/07/2013 7:28 PM

## 2013-04-07 NOTE — Progress Notes (Signed)
[facility] PROGRESS NOTE  DAY DEERY WUJ:[id number] DOB: 1956-01-16 DOA: 04/03/2013 PCP: [name], MD  Assessment/Plan:   Brief HPI:    57 year old gentleman with h/o appendiceal carcinoma, abdominal carcinomatosis, with multiple abdominal surgeries, has a G tube in place attached to drain as needed, an ileostomy and a enterocutaneous fistula, was recently discharged from [facility] medical center after an extended stay of 2 months. During his stay he received chemo every other week and was discharged to [facility] health for rehabilitation. He was brought to [facility] ED after less than a day stay at [facility] for nausea, vomiting and dehydration. On arrival to ED he was found to be dehydrated, hypotensive, hypokalemic and in acute renal failure. Over the last 5 days we have been giving him IV fluids in the form of NS, Dextrose 5 fluids to keep his BP and repleting his electrolytes.   1. Hypotension from dehydration secondary to nausea and vomiting - Nausea and vomiting resolved. But his BP continues to run borderline. He remains asymptomatic all along.  2. Acute renal failure/ metabolic alkalosis  -  Improved, secondary to ongoing GI loses . closely follow intake output and metabolic panel. Renal consult requested to assist with the management of alkalosis. He is on Iv normal saline 1 liter every 4 hours and dextrose fluids for his hypernatremia. His hypokalemia is persistent.  3. Abdominal carcinomatosis - Treatment per his oncologist. Patient presently follows at [facility] wit Dr Leavy Cella and he was recently transferred to Dr Bonney Aid for further chemo. Tried to reach Dr Bonney Aid, but found out that the patient never had chemo under Dr Bonney Aid. Dr Leavy Cella was off and not in his office today. But we have records from Dr Sharrell Ku office in the chart. Patient currently is requesting Dr [name] to talk to him.  4. History of gout - presently not having any  flares. 5. Hypokalemia: replete as needed.  6. Nutrition on TPN and clear liquid diet.  G tube drain per shift.  7. DVT prophylaxis.   HPI/Subjective: Had a bad night.   Objective: Filed Vitals:   04/07/13 0600 04/07/13 0700 04/07/13 0800 04/07/13 0900  BP: 102/66 108/59 113/62 107/60  Pulse: 72 77 77 81  Temp:   97.9 F (36.6 C)   TempSrc:   Oral   Resp: 28 26 29    Height:      Weight:      SpO2: 100% 100% 100% 100%    Intake/Output Summary (Last 24 hours) at 04/07/13 1030 Last data filed at 04/07/13 0930  Gross per 24 hour  Intake 18017.67 ml  Output  95621 ml  Net 7372.67 ml   Filed Weights   04/04/13 0000 04/05/13 0500 04/07/13 0500  Weight: 76.4 kg (168 lb 6.9 oz) 78 kg (171 lb 15.3 oz) 85.5 kg (188 lb 7.9 oz)    Exam:  General: Well-developed and poorly nourished.  Cardiovascular: S1-S2 heard tachycardic.  Respiratory: No rhonchi or crepitations.  Abdomen: colostomy pouch and suction tube in place.  Skin: No new rash.  Musculoskeletal: No edema.   Neurologic: Moves all extremities.   Data Reviewed: Basic Metabolic Panel:  Recent Labs Lab 04/03/13 0600  04/04/13 0441  04/05/13 0750 04/05/13 0930 04/05/13 1115 04/05/13 2045 04/06/13 0400 04/06/13 1600 04/07/13 0500  NA 147*  --  146*  < > 147* 146*  --   --  143 143 144  K 2.9*  < > 2.6*  < > 3.3*  3.2* 3.3* 2.7* 2.7* 2.8* 2.8*  CL 94*  --  89*  < > 82* 81*  --   --  83* 86* 102  CO2 44*  --  >45*  < > >45* >45*  --   --  >45* >45* 38*  GLUCOSE 129*  --  170*  < > 173* 140*  --   --  109* 113* 104*  BUN 68*  --  61*  < > 75* 76*  --   --  77* 72* 55*  CREATININE 1.21  --  1.23  < > 1.92* 1.96*  --   --  2.16* 2.16* 1.79*  CALCIUM 8.9  --  9.8  < > 10.0 10.1  --   --  9.0 8.9 7.2*  MG 2.2  --  2.2  --   --   --   --   --  1.9  --  1.3*  PHOS 5.5*  --  5.5*  --   --   --   --   --  6.3*  --  3.0  < > = values in this interval not displayed. Liver Function Tests:  Recent Labs Lab 04/03/13 0230  04/03/13 0600 04/04/13 0441 04/05/13 0750 04/06/13 0400  AST 54* 42* 41* 66* 49*  ALT 70* 58* 55* 71* 56*  ALKPHOS 401* 335* 336* 380* 289*  BILITOT 0.6 0.6 0.5 0.8 0.8  PROT 7.9 6.7 6.9 7.2 5.8*  ALBUMIN 2.7* 2.5* 2.6* 2.6* 2.1*   No results found for this basename: LIPASE, AMYLASE,  in the last 168 hours No results found for this basename: AMMONIA,  in the last 168 hours CBC:  Recent Labs Lab 04/03/13 0230 04/03/13 0600 04/04/13 0441 04/06/13 0400  WBC 4.1 2.3* 3.2* 3.6*  NEUTROABS 2.1 1.3* 1.9 2.2  HGB 16.9 9.6* 10.0* 8.8*  HCT 49.4 29.7* 30.9* 27.0*  MCV 95.7 97.7 98.7 99.3  PLT 263 274 316 251   Cardiac Enzymes:  Recent Labs Lab 04/03/13 0230  TROPONINI <0.30   BNP (last 3 results)  Recent Labs  12/22/12 1700  PROBNP 33.8   CBG:  Recent Labs Lab 04/06/13 1544 04/06/13 2029 04/06/13 2358 04/07/13 0318 04/07/13 0854  GLUCAP 103* 106* 117* 123* 102*    Recent Results (from the past 240 hour(s))  CULTURE, BLOOD (ROUTINE X 2)     Status: None   Collection Time    04/03/13  3:00 AM      Result Value Range Status   Specimen Description BLOOD LEFT HAND   Final   Special Requests BOTTLES DRAWN AEROBIC AND ANAEROBIC 5CC   Final   Culture  Setup Time 04/03/2013 08:32   Final   Culture     Final   Value:        BLOOD CULTURE RECEIVED NO GROWTH TO DATE CULTURE WILL BE HELD FOR 5 DAYS BEFORE ISSUING A FINAL NEGATIVE REPORT   Report Status PENDING   Incomplete  CULTURE, BLOOD (ROUTINE X 2)     Status: None   Collection Time    04/03/13  3:05 AM      Result Value Range Status   Specimen Description BLOOD RIGHT HAND   Final   Special Requests BOTTLES DRAWN AEROBIC AND ANAEROBIC 5CC   Final   Culture  Setup Time 04/03/2013 08:32   Final   Culture     Final   Value:        BLOOD CULTURE RECEIVED NO GROWTH TO DATE CULTURE  WILL BE HELD FOR 5 DAYS BEFORE ISSUING A FINAL NEGATIVE REPORT   Report Status PENDING   Incomplete  URINE CULTURE     Status: None    Collection Time    04/03/13  3:45 AM      Result Value Range Status   Specimen Description URINE, CLEAN CATCH   Final   Special Requests NONE   Final   Culture  Setup Time 04/03/2013 08:39   Final   Colony Count NO GROWTH   Final   Culture NO GROWTH   Final   Report Status 04/04/2013 FINAL   Final  MRSA PCR SCREENING     Status: Abnormal   Collection Time    04/03/13  5:00 AM      Result Value Range Status   MRSA by PCR POSITIVE (*) NEGATIVE Final   Comment:            The GeneXpert MRSA Assay (FDA     approved for NASAL specimens     only), is one component of a     comprehensive MRSA colonization     surveillance program. It is not     intended to diagnose MRSA     infection nor to guide or     monitor treatment for     MRSA infections.     RESULT CALLED TO, READ BACK BY AND VERIFIED WITH:     C.CAGLE AT 0743 ON [id number] BY C.BONGEL     Studies: US Renal  04/06/2013  *RADIOLOGY REPORT*  Clinical Data: Renal insufficiency and history of peritoneal carcinomatosis.  RENAL/URINARY TRACT ULTRASOUND COMPLETE  Comparison:  CT of the abdomen on 12/16/2012.  Findings:  Right Kidney:  The right kidney measures approximately 12.3 cm in length.  Sonographic appearance is normal without evidence of hydronephrosis or focal lesion.  Left Kidney:  The left kidney measures approximately 13 cm in length and has a normal sonographic appearance without evidence of hydronephrosis.  Small cyst of the mid kidney measures 1.2 cm has a benign appearance.  Bladder:  The bladder was mildly distended at the time imaging and unremarkable in appearance.  IMPRESSION: Unremarkable renal ultrasound.  No evidence of hydronephrosis bilaterally.   Original Report Authenticated By: Irish Lack, M.D.     Scheduled Meds: . antiseptic oral rinse  15 mL Mouth Rinse q12n4p  . chlorhexidine  15 mL Mouth Rinse BID  . Chlorhexidine Gluconate Cloth  6 each Topical Q0600  . clindamycin  1 application Topical BID  .  enoxaparin (LOVENOX) injection  40 mg Subcutaneous Q24H  . fludrocortisone  0.1 mg Oral BID  . insulin aspart  0-15 Units Subcutaneous Q4H  . midodrine  10 mg Oral TID  . mupirocin ointment  1 application Nasal BID  . pantoprazole  40 mg Oral q morning - 10a  . potassium chloride  10 mEq Intravenous Q1H  . pregabalin  50 mg Oral TID  . sodium chloride  1,000 mL Intravenous Q4H  . sodium chloride  3 mL Intravenous Q12H  . vitamin C  500 mg Oral q morning - 10a  . zinc sulfate  220 mg Oral q morning - 10a   Continuous Infusions: . Marland KitchenTPN (CLINIMIX-E) Adult    . dextrose 5 % with kcl 100 mL/hr at 04/07/13 0830  . fat emulsion 250 mL (04/06/13 1920)  . TPN (CLINIMIX) Adult without lytes 83 mL/hr at 04/06/13 1746    Principal Problem:   Dehydration secondary to nausea and vomiting  Active Problems:   Abdominal malignant neoplasm   Hypokalemia   Acute kidney injury secondary to dehydration        [facility]  [facility] Pager [phone]. If 7PM-7AM, please contact night-coverage at [email or web address], password [facility] 04/07/2013, 10:30 AM  LOS: 4 days

## 2013-04-07 NOTE — Progress Notes (Signed)
Subjective: I/O yesterday 19L + and 10.5 L negative, + net 9L. Weight is up 8 kg. Feels a lot better, BP up and creat down today  Objective Vital signs in last 24 hours: Filed Vitals:   04/07/13 1300 04/07/13 1400 04/07/13 1500 04/07/13 1600  BP: 108/63 122/80 112/65   Pulse: 87 97 82   Temp:    98.3 F (36.8 C)  TempSrc:    Oral  Resp: 23 20 19    Height:      Weight:      SpO2: 100% 100% 100%    Weight change:   Intake/Output Summary (Last 24 hours) at 04/07/13 1743 Last data filed at 04/07/13 1730  Gross per 24 hour  Intake 18159.67 ml  Output  16109 ml  Net 7389.67 ml   Labs: Basic Metabolic Panel:  Recent Labs Lab 04/03/13 0600  04/04/13 0441  04/05/13 0930  04/05/13 2045 04/06/13 0400 04/06/13 1600 04/07/13 0500  NA 147*  --  146*  < > 146*  --   --  143 143 144  K 2.9*  < > 2.6*  < > 3.2*  < > 2.7* 2.7* 2.8* 2.8*  CL 94*  --  89*  < > 81*  --   --  83* 86* 102  CO2 44*  --  >45*  < > >45*  --   --  >45* >45* 38*  GLUCOSE 129*  --  170*  < > 140*  --   --  109* 113* 104*  BUN 68*  --  61*  < > 76*  --   --  77* 72* 55*  CREATININE 1.21  --  1.23  < > 1.96*  --   --  2.16* 2.16* 1.79*  CALCIUM 8.9  --  9.8  < > 10.1  --   --  9.0 8.9 7.2*  PHOS 5.5*  --  5.5*  --   --   --   --  6.3*  --  3.0  < > = values in this interval not displayed. Liver Function Tests:  Recent Labs Lab 04/04/13 0441 04/05/13 0750 04/06/13 0400  AST 41* 66* 49*  ALT 55* 71* 56*  ALKPHOS 336* 380* 289*  BILITOT 0.5 0.8 0.8  PROT 6.9 7.2 5.8*  ALBUMIN 2.6* 2.6* 2.1*   No results found for this basename: LIPASE, AMYLASE,  in the last 168 hours No results found for this basename: AMMONIA,  in the last 168 hours CBC:  Recent Labs Lab 04/03/13 0230 04/03/13 0600 04/04/13 0441 04/06/13 0400  WBC 4.1 2.3* 3.2* 3.6*  NEUTROABS 2.1 1.3* 1.9 2.2  HGB 16.9 9.6* 10.0* 8.8*  HCT 49.4 29.7* 30.9* 27.0*  MCV 95.7 97.7 98.7 99.3  PLT 263 274 316 251    PT/INR: @LABRCNTIP (inr:5)   Scheduled Meds ) . antiseptic oral rinse  15 mL Mouth Rinse q12n4p  . chlorhexidine  15 mL Mouth Rinse BID  . Chlorhexidine Gluconate Cloth  6 each Topical Q0600  . clindamycin  1 application Topical BID  . enoxaparin (LOVENOX) injection  40 mg Subcutaneous Q24H  . fludrocortisone  0.1 mg Oral BID  . insulin aspart  0-15 Units Subcutaneous Q4H  . midodrine  10 mg Oral TID  . mupirocin ointment  1 application Nasal BID  . pantoprazole  40 mg Oral q morning - 10a  . pregabalin  50 mg Oral TID  . [START ON 04/08/2013] sodium chloride  1,000 mL Intravenous Q8H  .  sodium chloride  3 mL Intravenous Q12H  . vitamin C  500 mg Oral q morning - 10a  . zinc sulfate  220 mg Oral q morning - 10a    Physical Exam:  Blood pressure 112/65, pulse 82, temperature 98.3 F (36.8 C), temperature source Oral, resp. rate 19, height 6\' 4"  (1.93 m), weight 85.5 kg (188 lb 7.9 oz), SpO2 100.00%.  Gen: alert, thin AAM no distress  Skin: no rash, cyanosis  HEENT: EOMI, sclera anicteric, throat dry  Neck: no JVD, no LAN  Chest: clear throughout  CV: regular, no rub or gallop, no carotid or femoral bruits, pedal pulses intact  Abdomen: soft, mid abdomen ileostomy, mid abd open wound with bag in place draining, LUQ small bore PEG tube hooked to suction, no ascites  Ext: no edema x 4 ext, no joint effusion  Neuro: alert, Ox3, no focal deficit   Impression 1. Hypernatremia, hypovolemic- improving 2. Hypochloremic met alkalosis- improving 3. AKI due to vol depletion from severe GI losses- improving 4. Metastatic peritoneal carcinomatosis- had signet and renal features  Rec- continue cc:cc fluid replacement, but change to 1/2 NS with 20 KCL. Decrease NS boluses to 2L/day. Decrease d5w to 50/hr and prob d/c soon. Need to find a way to decrease GI losses if possible, ?octreotide.     Alan Moselle  MD [phone] pgr    [phone] cell 04/07/2013, 5:43  PM

## 2013-04-07 NOTE — Progress Notes (Signed)
Per previous order from Dr Arlean Hopping, pt given Bolus of Normal saline. Pt had a total of of output from NGT and 1200 urine output from 2000 04/06/13 to 0200 this am. Will continue to monitor.

## 2013-04-07 NOTE — Consult Note (Addendum)
Ostomy follow-up: Pt has Eakin pouch to fistula site on midline abd which is leaking.  Applied barrier ring and small Eakin pouch to area located in deep crease; refer to previous WOC notes for assessment and measurements of fistula and ostomy stoma.  Both sites unchanged from previous assessment.  Fistula is draining large amt of clear fluid.  Pt states he was previously on suction to the fistula pouch which helped decrease the occurrence of pouch leakage.  Attached the bottom of the pouch to low cont wall suction.   Applied one piece pouch and barrier ring to ileostomy site on right lower quad.  Stoma red and viable, located in deep crease below skin level.  Difficult pouching situation.  No stool or flatus at present from ostomy.  Pt well informed on pouching routines when discussed.  Supplies at bedside for staff use. Please re-consult if further assistance is needed.  Thank-you,  Cammie Mcgee MSN, RN, CWOCN, [place], CNS [phone]

## 2013-04-08 ENCOUNTER — Encounter (HOSPITAL_COMMUNITY): Payer: Self-pay | Admitting: Internal Medicine

## 2013-04-08 DIAGNOSIS — K632 Fistula of intestine: Secondary | ICD-10-CM

## 2013-04-08 DIAGNOSIS — C801 Malignant (primary) neoplasm, unspecified: Secondary | ICD-10-CM

## 2013-04-08 DIAGNOSIS — E870 Hyperosmolality and hypernatremia: Secondary | ICD-10-CM

## 2013-04-08 DIAGNOSIS — E860 Dehydration: Secondary | ICD-10-CM

## 2013-04-08 DIAGNOSIS — E873 Alkalosis: Secondary | ICD-10-CM

## 2013-04-08 LAB — GLUCOSE, CAPILLARY
Glucose-Capillary: 88 mg/dL (ref 70–99)
Glucose-Capillary: 72 mg/dL (ref 70–99)
Glucose-Capillary: 66 mg/dL — ABNORMAL LOW (ref 70–99)
Glucose-Capillary: 63 mg/dL — ABNORMAL LOW (ref 70–99)
Glucose-Capillary: 65 mg/dL — ABNORMAL LOW (ref 70–99)
Glucose-Capillary: 98 mg/dL (ref 70–99)

## 2013-04-08 LAB — BASIC METABOLIC PANEL
CO2: 29 mEq/L (ref 19–32)
Calcium: 7.4 mg/dL — ABNORMAL LOW (ref 8.4–10.5)
GFR calc Af Amer: 54 mL/min — ABNORMAL LOW (ref >90)
Sodium: 144 mEq/L (ref 135–145)

## 2013-04-08 LAB — PHOSPHORUS: Phosphorus: 2.3 mg/dL (ref 2.3–4.6)

## 2013-04-08 MED ORDER — DEXTROSE 50 % IV SOLN
25.0000 mL | Freq: Once | INTRAVENOUS | Status: AC
  Administered 2013-04-08: 25 mL via INTRAVENOUS

## 2013-04-08 MED ORDER — TRACE MINERALS CR-CU-F-FE-I-MN-MO-SE-ZN IV SOLN
INTRAVENOUS | Status: AC
  Administered 2013-04-08: 18:00:00 via INTRAVENOUS
  Filled 2013-04-08: qty 2400

## 2013-04-08 MED ORDER — DEXTROSE 50 % IV SOLN: 25.0000 mL | Freq: Once | INTRAVENOUS | Status: AC

## 2013-04-08 MED ORDER — STERILE WATER FOR INJECTION IV SOLN
INTRAVENOUS | Status: AC
  Administered 2013-04-08 (×2): via INTRAVENOUS
  Filled 2013-04-08 (×5): qty 1000

## 2013-04-08 MED ORDER — POTASSIUM CHLORIDE 10 MEQ/50ML IV SOLN
10.0000 meq | INTRAVENOUS | Status: AC
  Administered 2013-04-08 (×3): 10 meq via INTRAVENOUS
  Filled 2013-04-08: qty 150

## 2013-04-08 MED ORDER — DEXTROSE 50 % IV SOLN
INTRAVENOUS | Status: AC
  Administered 2013-04-08: 25 mL via INTRAVENOUS
  Filled 2013-04-08: qty 50

## 2013-04-08 MED ORDER — POTASSIUM CHLORIDE IN NACL 20-0.45 MEQ/L-% IV SOLN
INTRAVENOUS | Status: DC
  Administered 2013-04-08: 325 mL/h via INTRAVENOUS

## 2013-04-08 MED ORDER — DEXTROSE 50 % IV SOLN
INTRAVENOUS | Status: AC
  Filled 2013-04-08: qty 50

## 2013-04-08 MED ORDER — DEXTROSE 50 % IV SOLN
25.0000 mL | Freq: Once | INTRAVENOUS | Status: AC | PRN
  Administered 2013-04-08: 25 mL via INTRAVENOUS

## 2013-04-08 MED ORDER — FAT EMULSION 20 % IV EMUL
250.0000 mL | INTRAVENOUS | Status: AC
  Administered 2013-04-08: 250 mL via INTRAVENOUS
  Filled 2013-04-08: qty 250

## 2013-04-08 MED ORDER — POTASSIUM CHLORIDE IN NACL 20-0.45 MEQ/L-% IV SOLN
INTRAVENOUS | Status: AC
  Administered 2013-04-08: 325 mL/h via INTRAVENOUS
  Filled 2013-04-08 (×2): qty 1000

## 2013-04-08 MED ORDER — POTASSIUM CHLORIDE IN NACL 20-0.45 MEQ/L-% IV SOLN
INTRAVENOUS | Status: DC
  Administered 2013-04-08 (×2): via INTRAVENOUS
  Filled 2013-04-08 (×4): qty 1000

## 2013-04-08 MED ORDER — MAGNESIUM SULFATE 40 MG/ML IJ SOLN
2.0000 g | Freq: Once | INTRAMUSCULAR | Status: AC
  Administered 2013-04-08: 2 g via INTRAVENOUS
  Filled 2013-04-08: qty 50

## 2013-04-08 MED ORDER — POTASSIUM CHLORIDE IN NACL 20-0.45 MEQ/L-% IV SOLN
INTRAVENOUS | Status: DC
  Administered 2013-04-08 (×4): via INTRAVENOUS
  Filled 2013-04-08 (×10): qty 1000

## 2013-04-08 MED ORDER — STERILE WATER FOR INJECTION IV SOLN
INTRAVENOUS | Status: AC
  Administered 2013-04-08 – 2013-04-09 (×2): via INTRAVENOUS
  Filled 2013-04-08 (×4): qty 1000

## 2013-04-08 MED ORDER — DEXTROSE 50 % IV SOLN: 25.0000 mL | Freq: Once | INTRAVENOUS | Status: AC | PRN

## 2013-04-08 NOTE — Consult Note (Signed)
Reason for Consult: Significant GI fluid losses, extensive surgical history Referring Physician: Dr. Marchia Meiers [name] is an 57 y.o. male.  HPI: 57 year old gentleman with h/o appendiceal carcinoma, abdominal carcinomatosis, with multiple abdominal surgeries, has a G tube in place attached to drain as needed, an ileostomy and a enterocutaneous fistula, was recently discharged from [facility] medical center after an extended stay of 2 months. During his stay he received chemo every other week and was discharged to [facility] health for rehabilitation. He was brought to [facility] ED after less than a day stay at [facility] for nausea, vomiting and dehydration. On arrival to ED he was found to be dehydrated, hypotensive, hypokalemic and in acute renal failure. Over the last 5 days we have been giving him IV fluids in the form of NS, Dextrose 5 fluids to keep his BP and repleting his electrolytes.  We are asked to see the patient to evaluate his excess GI losses and make recommendations.   Past Medical History  Diagnosis Date  . Hypertension   . GERD (gastroesophageal reflux disease)   . Small bowel obstruction 2013  . 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 Acute cholecystitis s/p perc draianage [id number] 12/20/2012  . Anemia of chronic disease 12/19/2012  . 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 MRSA bacteremia 01/04/2013  . SBO (small bowel obstruction) 11/07/2011  . Septic shock due to Staphylococcus aureus 12/24/2012  . Severe protein-calorie malnutrition 01/17/2013    Past Surgical History  Procedure Laterality Date  . Tonsillectomy    . Esophagogastroduodenoscopy  12/11/2012    Procedure: ESOPHAGOGASTRODUODENOSCOPY (EGD);  Surgeon: Louis Meckel, MD;  Location: [facility] ENDOSCOPY;  Service: Endoscopy;  Laterality: N/A;  . Duodenal stent placement  12/11/2012    Procedure: DUODENAL  STENT PLACEMENT;  Surgeon: Louis Meckel, MD;  Location: [facility] ENDOSCOPY;  Service: Endoscopy;  Laterality: N/A;  . Esophagogastroduodenoscopy  12/13/2012    Procedure: ESOPHAGOGASTRODUODENOSCOPY (EGD);  Surgeon: Louis Meckel, MD;  Location: [facility] OR;  Service: Endoscopy;  Laterality: N/A;  with attempt to stent duodenal stricture  . Peg placement  12/12/2012    Procedure: PERCUTANEOUS ENDOSCOPIC GASTROSTOMY (PEG) PLACEMENT;  Surgeon: Louis Meckel, MD;  Location: [facility] ENDOSCOPY;  Service: Endoscopy;  Laterality: N/A;  . Small intestine surgery  jan 2013    [facility].  SB resection, ileostomy, gastrostomy  . Debulking  sep 2013    [facility].  ileal SB resection, debulking of peritoneal carcinomatosis, intraperitoneal chemotherapy  . Colon surgery  oct 2013    [facility] transverse colon perforation  . Enteroscopy  12/30/2012    Procedure: ENTEROSCOPY;  Surgeon: Louis Meckel, MD;  Location: [facility] ENDOSCOPY;  Service: Endoscopy;  Laterality: N/A;  With possible stent placement; please have duodenal stent available  . Duodenal stent placement  12/30/2012    Procedure: DUODENAL STENT PLACEMENT;  Surgeon: Louis Meckel, MD;  Location: [facility] ENDOSCOPY;  Service: Endoscopy;  Laterality: N/A;    Family History  Problem Relation Age of Onset  . Lung cancer Brother   . Hypertension Mother   . Hypertension Father   . Heart failure Father     Social History:  reports that he has never smoked. He has never used smokeless tobacco. He reports that he does not drink alcohol or use illicit drugs.  Allergies:  Allergies  Allergen Reactions  . Percocet (  Oxycodone-Acetaminophen) Nausea And Vomiting    hallucination    Medications: I have reviewed the patient's current medications.  Results for orders placed during the hospital encounter of 04/03/13 (from the past 48 hour(s))  GLUCOSE, CAPILLARY     Status: Abnormal   Collection Time    04/06/13  3:44 PM      Result Value Range   Glucose-Capillary 103 (*) 70 -  99 mg/dL  BASIC METABOLIC PANEL     Status: Abnormal   Collection Time    04/06/13  4:00 PM      Result Value Range   Sodium 143  135 - 145 mEq/L   Potassium 2.8 (*) 3.5 - 5.1 mEq/L   Chloride 86 (*) 96 - 112 mEq/L   CO2 >45 (*) 19 - 32 mEq/L   Comment: REPEATED TO VERIFY     CRITICAL RESULT CALLED TO, READ BACK BY AND VERIFIED WITH:     F.ZHENG AT 1707 ON 4.21.14 BY JPEEBLES   Glucose, Bld 113 (*) 70 - 99 mg/dL   BUN 72 (*) 6 - 23 mg/dL   Creatinine, Ser 1.61 (*) 0.50 - 1.35 mg/dL   Calcium 8.9  8.4 - 09.6 mg/dL   GFR calc non Af Amer 32 (*) >90 mL/min   GFR calc Af Amer 38 (*) >90 mL/min   Comment:            The eGFR has been calculated     using the CKD EPI equation.     This calculation has not been     validated in all clinical     situations.     eGFR's persistently     <90 mL/min signify     possible Chronic Kidney Disease.  GLUCOSE, CAPILLARY     Status: Abnormal   Collection Time    04/06/13  8:29 PM      Result Value Range   Glucose-Capillary 106 (*) 70 - 99 mg/dL  GLUCOSE, CAPILLARY     Status: Abnormal   Collection Time    04/06/13 11:58 PM      Result Value Range   Glucose-Capillary 117 (*) 70 - 99 mg/dL   Comment 1 Documented in Chart     Comment 2 Notify RN    GLUCOSE, CAPILLARY     Status: Abnormal   Collection Time    04/07/13  3:18 AM      Result Value Range   Glucose-Capillary 123 (*) 70 - 99 mg/dL   Comment 1 Documented in Chart     Comment 2 Notify RN    BASIC METABOLIC PANEL     Status: Abnormal   Collection Time    04/07/13  5:00 AM      Result Value Range   Sodium 144  135 - 145 mEq/L   Potassium 2.8 (*) 3.5 - 5.1 mEq/L   Chloride 102  96 - 112 mEq/L   Comment: DELTA CHECK NOTED     REPEATED TO VERIFY   CO2 38 (*) 19 - 32 mEq/L   Glucose, Bld 104 (*) 70 - 99 mg/dL   BUN 55 (*) 6 - 23 mg/dL   Creatinine, Ser 0.45 (*) 0.50 - 1.35 mg/dL   Calcium 7.2 (*) 8.4 - 10.5 mg/dL   GFR calc non Af Amer 41 (*) >90 mL/min   GFR calc Af Amer 47  (*) >90 mL/min   Comment:            The eGFR has been calculated  using the CKD EPI equation.     This calculation has not been     validated in all clinical     situations.     eGFR's persistently     <90 mL/min signify     possible Chronic Kidney Disease.  MAGNESIUM     Status: Abnormal   Collection Time    04/07/13  5:00 AM      Result Value Range   Magnesium 1.3 (*) 1.5 - 2.5 mg/dL  PHOSPHORUS     Status: None   Collection Time    04/07/13  5:00 AM      Result Value Range   Phosphorus 3.0  2.3 - 4.6 mg/dL  GLUCOSE, CAPILLARY     Status: Abnormal   Collection Time    04/07/13  8:54 AM      Result Value Range   Glucose-Capillary 102 (*) 70 - 99 mg/dL   Comment 1 Documented in Chart     Comment 2 Notify RN    GLUCOSE, CAPILLARY     Status: None   Collection Time    04/07/13 12:51 PM      Result Value Range   Glucose-Capillary 98  70 - 99 mg/dL   Comment 1 Documented in Chart     Comment 2 Notify RN    GLUCOSE, CAPILLARY     Status: None   Collection Time    04/07/13  4:51 PM      Result Value Range   Glucose-Capillary 85  70 - 99 mg/dL   Comment 1 Documented in Chart     Comment 2 Notify RN    POTASSIUM     Status: Abnormal   Collection Time    04/07/13  6:30 PM      Result Value Range   Potassium 3.0 (*) 3.5 - 5.1 mEq/L  GLUCOSE, CAPILLARY     Status: None   Collection Time    04/07/13  8:01 PM      Result Value Range   Glucose-Capillary 73  70 - 99 mg/dL  GLUCOSE, CAPILLARY     Status: Abnormal   Collection Time    04/07/13 11:14 PM      Result Value Range   Glucose-Capillary 62 (*) 70 - 99 mg/dL  GLUCOSE, CAPILLARY     Status: Abnormal   Collection Time    04/07/13 11:37 PM      Result Value Range   Glucose-Capillary 48 (*) 70 - 99 mg/dL  GLUCOSE, CAPILLARY     Status: Abnormal   Collection Time    04/07/13 11:47 PM      Result Value Range   Glucose-Capillary 54 (*) 70 - 99 mg/dL  GLUCOSE, CAPILLARY     Status: None   Collection Time     04/08/13 12:10 AM      Result Value Range   Glucose-Capillary 88  70 - 99 mg/dL  GLUCOSE, CAPILLARY     Status: Abnormal   Collection Time    04/08/13  4:21 AM      Result Value Range   Glucose-Capillary 61 (*) 70 - 99 mg/dL   Comment 1 Documented in Chart     Comment 2 Notify RN    GLUCOSE, CAPILLARY     Status: None   Collection Time    04/08/13  4:57 AM      Result Value Range   Glucose-Capillary 80  70 - 99 mg/dL  MAGNESIUM     Status: Abnormal  Collection Time    04/08/13  5:00 AM      Result Value Range   Magnesium 1.2 (*) 1.5 - 2.5 mg/dL  PHOSPHORUS     Status: None   Collection Time    04/08/13  5:00 AM      Result Value Range   Phosphorus 2.3  2.3 - 4.6 mg/dL  BASIC METABOLIC PANEL     Status: Abnormal   Collection Time    04/08/13  5:00 AM      Result Value Range   Sodium 144  135 - 145 mEq/L   Potassium 3.6  3.5 - 5.1 mEq/L   Chloride 111  96 - 112 mEq/L   Comment: DELTA CHECK NOTED   CO2 29  19 - 32 mEq/L   Glucose, Bld 80  70 - 99 mg/dL   BUN 38 (*) 6 - 23 mg/dL   Creatinine, Ser 4.54 (*) 0.50 - 1.35 mg/dL   Calcium 7.4 (*) 8.4 - 10.5 mg/dL   GFR calc non Af Amer 47 (*) >90 mL/min   GFR calc Af Amer 54 (*) >90 mL/min   Comment:            The eGFR has been calculated     using the CKD EPI equation.     This calculation has not been     validated in all clinical     situations.     eGFR's persistently     <90 mL/min signify     possible Chronic Kidney Disease.  GLUCOSE, CAPILLARY     Status: None   Collection Time    04/08/13  7:48 AM      Result Value Range   Glucose-Capillary 72  70 - 99 mg/dL  GLUCOSE, CAPILLARY     Status: Abnormal   Collection Time    04/08/13 11:46 AM      Result Value Range   Glucose-Capillary 66 (*) 70 - 99 mg/dL    No results found.  Review of Systems  Constitutional: Negative for fever and chills.  HENT: Negative.   Eyes: Negative.   Respiratory: Negative.   Cardiovascular: Negative.   Gastrointestinal:  Positive for nausea. Negative for vomiting and abdominal pain.  Genitourinary: Negative.   Musculoskeletal: Negative.   Skin: Negative.   Neurological: Positive for dizziness and weakness. Negative for tingling.  Endo/Heme/Allergies: Negative.   Psychiatric/Behavioral: Negative.    Blood pressure 131/69, pulse 88, temperature 98.7 F (37.1 C), temperature source Oral, resp. rate 24, height 6\' 4"  (1.93 m), weight 206 lb 9.1 oz (93.7 kg), SpO2 100.00%. Physical Exam  Constitutional: He is oriented to person, place, and time. No distress.  Chronically ill appearing but pleasant  HENT:  Head: Normocephalic and atraumatic.  Eyes: Conjunctivae are normal. Pupils are equal, round, and reactive to light.  Neck: Normal range of motion. Neck supple.  Cardiovascular: Normal rate and regular rhythm.   Respiratory: Effort normal and breath sounds normal.  GI: Soft. There is no tenderness.   Stoma type/location: Midline fistula and RUQ ileostomy Stomal assessment/size: midline fistula opening is 1.5cm x 1cm, Ileostomy is 1cm x 2cm. Both are located in a crease (ileostomy) or deep crater (fistula)   Genitourinary:  deferred  Musculoskeletal: He exhibits edema. He exhibits no tenderness.  Neurological: He is alert and oriented to person, place, and time.  Skin: Skin is warm and dry.  Psychiatric: He has a normal mood and affect. His behavior is normal. Thought content normal.  Assessment/Plan: Complex abdominal surgeries with EC fistula, RQ ileostomy, drain: admitted with severe dehydration, needs to stay on IV fluids and adjust TNA to help with nutrition hydration.  Nothing to offer from a surgical standpoint otherwise.  Dr. Luisa Hart will see the patient later today and make further rec if appropriate.  Laquandra Carrillo 04/08/2013, 12:56 PM

## 2013-04-08 NOTE — Progress Notes (Signed)
PARENTERAL NUTRITION CONSULT NOTE - FOLLOW UP  Pharmacy Consult for TPN Indication: enterocutaneous fistula, abdominal carcinomatosis  Allergies  Allergen Reactions  . Percocet (Oxycodone-Acetaminophen) Nausea And Vomiting    hallucination   Patient Measurements: Height: 6\' 4"  (193 cm) Weight: 206 lb 9.1 oz (93.7 kg) IBW/kg (Calculated) : 86.8 Usual Weight: 170-180 lbs  Vital Signs: Temp: 99.1 F (37.3 C) (04/23 0400) Temp src: Oral (04/23 0400) BP: 115/60 mmHg (04/23 0700) Pulse Rate: 77 (04/23 0700) Intake/Output from previous day: 04/22 0701 - 04/23 0700 In: 16109 [P.O.:300; I.V.:11784; IV Piggyback:4750; TPN:2092] Out: 60454 [Urine:5260; Emesis/NG output:875; Drains:8000] Intake/Output from this shift:    Labs:  Recent Labs  04/06/13 0400  WBC 3.6*  HGB 8.8*  HCT 27.0*  PLT 251    Recent Labs  04/05/13 0750  04/06/13 0400 04/06/13 1600 04/07/13 0500 04/07/13 1830 04/08/13 0500  NA 147*  < > 143 143 144  --  144  K 3.3*  < > 2.7* 2.8* 2.8* 3.0* 3.6  CL 82*  < > 83* 86* 102  --  111  CO2 >45*  < > >45* >45* 38*  --  29  GLUCOSE 173*  < > 109* 113* 104*  --  80  BUN 75*  < > 77* 72* 55*  --  38*  CREATININE 1.92*  < > 2.16* 2.16* 1.79*  --  1.60*  CALCIUM 10.0  < > 9.0 8.9 7.2*  --  7.4*  MG  --   --  1.9  --  1.3*  --  1.2*  PHOS  --   --  6.3*  --  3.0  --  2.3  PROT 7.2  --  5.8*  --   --   --   --   ALBUMIN 2.6*  --  2.1*  --   --   --   --   AST 66*  --  49*  --   --   --   --   ALT 71*  --  56*  --   --   --   --   ALKPHOS 380*  --  289*  --   --   --   --   BILITOT 0.8  --  0.8  --   --   --   --   PREALBUMIN  --   --  28.2  --   --   --   --   TRIG  --   --  177*  --   --   --   --   CHOL  --   --  153  --   --   --   --   < > = values in this interval not displayed. Estimated Creatinine Clearance: 63.3 ml/min (by C-G formula based on Cr of 1.6).    Recent Labs  04/07/13 2347 04/08/13 0010 04/08/13 0457  GLUCAP 54* 88 80     Insulin Requirements in the past 24 hours:  No SSI required, required 2 doses D50 for hypoglycemia 20 units of regular insulin per liter provided in the TNA (5/20 formula)  IVF per Nephrology: 1/2NS with [facility] for cc:cc replacement of GI losses D5 with 40KCl at 61ml/hr (decreased 4/22) NS 1L boluses q12h (decreased 4/22)  Current Nutrition:  Clinimix 5/20 at 83 ml/hr + lipids at 10 ml/hr MWF.  Nutritional Goals:  RD recommendations (4/18): Kcal: 2220-2565 Protein: 100-132 grams Fluid: 2.3-2.6 L   Can achieve these nutrition goals with  Clinimix E5/20 at a goal rate of 100 ml/hr + IVFE 20% at 10 ml/hr on MWF to provide: 120g/day protein, 2318 Kcal/day avg.(2112 Kcal/day STTHS, 2592 Kcal/day MWF).    Assessment:  66 yoM with complicated PMH of metastatic adenocarcinoma, peritoneal carcinomatosis s/p extensive debulking procedures and abdominal surgeries which has left him with multiple drains and fistulas on chronic TPN via a PICC line. Discharged from [facility] recently and was transferred to his nursing home in [place] started developing N/V and weakness. Was receiving TPN cycled over 18 hours at [facility]. Will continue TPN inpatient but will be difficult to provide large protein and kcal amounts with premixed Clinimix TNAs.  RD evaluated the patient 4/18 and recommended a reduced protein goal than PTA.  Pharmacy will be able to meet new nutrition goals with the premixed Clinimix product.  Will provide TNA continuously inpatient due to high volume that needs to be administered with premixed product.  Labs:  CBGs: now hypoglycemia after dextrose-containing IVF decreased.  Electrolytes: K+ finally increasing after massive replacement. CO2 high but improved - metabolic alkalosis with extreme GI losses. Phos elevated 4/21, but now WNL. Mg remains low despite replacement 4/22  LFTs: AST/ALT slightly elevated, Alk Phos remains elevated (4/21)  TGs: 195 (4/19), 177  (4/21)  Prealbumin: 33 (4/19), 28.2 (4/21)  Renal: ARF from hypotension/volume depletion; SCr now improving. Fluid balance now positive and weight increasing (8kg from yesterday, 7kg previous day)  Plan:  At 1800 today:  Increase Clinimix E5/20 to goal 184ml/hr  Magnesium sulfate 2gm IV today per MD KCl x 3 runs today to prevent drop in K+ Remove insulin from TNA today - anticipate need to add back since TNA rate increasing. Cont SSI.  Fat emulsion at 10 ml/hr (MWF only due to ongoing shortage).  TNA to contain standard multivitamins and trace elements (MWF only due to ongoing shortage). Will continue to use IV as unsure of enteral absorption given massive GI losses. Also vitamin C and zinc continued from home. MIVF per MD.  TNA lab panels on Mondays & Thursdays.    Loralee Pacas, PharmD, BCPS Pager: [phone]  04/08/2013 7:41 AM

## 2013-04-08 NOTE — Progress Notes (Signed)
Subjective: Feels better, 19L in , 14L out, +5L. UOP 5.2L, gastric drains 8.8L yest  Objective Vital signs in last 24 hours: Filed Vitals:   04/08/13 1000 04/08/13 1100 04/08/13 1200 04/08/13 1300  BP: 140/77 131/69 140/70 116/64  Pulse: 76 88 104 87  Temp:      TempSrc:      Resp: 32 24 30 31   Height:      Weight:      SpO2: 100% 100% 100% 100%   Weight change: 8.2 kg (18 lb 1.3 oz)  Intake/Output Summary (Last 24 hours) at 04/08/13 1323 Last data filed at 04/08/13 1200  Gross per 24 hour  Intake  16109 ml  Output  12670 ml  Net   7763 ml   Labs: Basic Metabolic Panel:  Recent Labs Lab 04/04/13 0441  04/06/13 0400 04/06/13 1600 04/07/13 0500 04/07/13 1830 04/08/13 0500  NA 146*  < > 143 143 144  --  144  K 2.6*  < > 2.7* 2.8* 2.8* 3.0* 3.6  CL 89*  < > 83* 86* 102  --  111  CO2 >45*  < > >45* >45* 38*  --  29  GLUCOSE 170*  < > 109* 113* 104*  --  80  BUN 61*  < > 77* 72* 55*  --  38*  CREATININE 1.23  < > 2.16* 2.16* 1.79*  --  1.60*  CALCIUM 9.8  < > 9.0 8.9 7.2*  --  7.4*  PHOS 5.5*  --  6.3*  --  3.0  --  2.3  < > = values in this interval not displayed. Liver Function Tests:  Recent Labs Lab 04/04/13 0441 04/05/13 0750 04/06/13 0400  AST 41* 66* 49*  ALT 55* 71* 56*  ALKPHOS 336* 380* 289*  BILITOT 0.5 0.8 0.8  PROT 6.9 7.2 5.8*  ALBUMIN 2.6* 2.6* 2.1*   No results found for this basename: LIPASE, AMYLASE,  in the last 168 hours No results found for this basename: AMMONIA,  in the last 168 hours CBC:  Recent Labs Lab 04/03/13 0230 04/03/13 0600 04/04/13 0441 04/06/13 0400  WBC 4.1 2.3* 3.2* 3.6*  NEUTROABS 2.1 1.3* 1.9 2.2  HGB 16.9 9.6* 10.0* 8.8*  HCT 49.4 29.7* 30.9* 27.0*  MCV 95.7 97.7 98.7 99.3  PLT 263 274 316 251   PT/INR: @LABRCNTIP (inr:5)   Scheduled Meds ) . antiseptic oral rinse  15 mL Mouth Rinse q12n4p  . chlorhexidine  15 mL Mouth Rinse BID  . Chlorhexidine Gluconate Cloth  6 each Topical Q0600  . clindamycin  1  application Topical BID  . dextrose      . enoxaparin (LOVENOX) injection  40 mg Subcutaneous Q24H  . fludrocortisone  0.1 mg Oral BID  . insulin aspart  0-15 Units Subcutaneous Q4H  . midodrine  10 mg Oral TID  . mupirocin ointment  1 application Nasal BID  . pantoprazole  40 mg Oral q morning - 10a  . pregabalin  50 mg Oral TID  . sodium chloride  1,000 mL Intravenous Q12H  . sodium chloride  3 mL Intravenous Q12H  . vitamin C  500 mg Oral q morning - 10a  . zinc sulfate  220 mg Oral q morning - 10a    Physical Exam:  Blood pressure 116/64, pulse 87, temperature 98.7 F (37.1 C), temperature source Oral, resp. rate 31, height 6\' 4"  (1.93 m), weight 93.7 kg (206 lb 9.1 oz), SpO2 100.00%.  Gen: alert, thin AAM  no distress  Skin: no rash, cyanosis  HEENT: EOMI, sclera anicteric, throat dry  Neck: no JVD, no LAN  Chest: clear throughout  CV: regular, no rub or gallop, no carotid or femoral bruits, pedal pulses intact  Abdomen: soft, mid abdomen ileostomy, mid abd open wound with bag in place draining, LUQ small bore PEG tube hooked to suction, no ascites  Ext: no edema x 4 ext, no joint effusion  Neuro: alert, Ox3, no focal deficit   Impression 1. Hypernatremia, hypovolemic- improved. Change IVF to 1/4 NS 2. Hypochloremic met alkalosis- improved with volume (total body Na+) repletion 3. AKI due to severe vol depletion from severe GI losses- resolving 4. 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 He came in volume depleted, this got worse in the hospital and he developed contraction alkalosis and AKI from the severe vol depletion. Most of this is better, but it took 38 liters of fluid to correct over the last 2-3 days. Will stop D5W drip and saline boluses, he is euvolemic now. Continue to replace GI/renal losses cc:cc, but change replacement fluids to 1/4 NS w 20  KCl/liter for now. Suggest get surg or GI opinion on controlling gastric output/losses.   Vinson Moselle  MD [phone] pgr    [phone] cell 04/08/2013, 1:23 PM

## 2013-04-08 NOTE — Consult Note (Signed)
Nothing to offer.  Need to discuss with his oncologist in [place] since we are not managing his IVF. Needs to stay on IVF more during the day.

## 2013-04-08 NOTE — Progress Notes (Signed)
[facility] PROGRESS NOTE  [name]:[id number] DOB: September 27, 1956 DOA: 04/03/2013 PCP: [name], MD  Assessment/Plan:   Brief HPI:    57 year old gentleman with h/o appendiceal carcinoma, abdominal carcinomatosis, with multiple abdominal surgeries, has a G tube in place attached to drain as needed, an ileostomy and a enterocutaneous fistula, was recently discharged from [facility] after an extended stay of 2 months. During his stay he received chemo every other week and was discharged to [facility] health for rehabilitation. He was brought to [facility] ED after less than a day stay at [facility] for nausea, vomiting and dehydration. On arrival to ED he was found to be dehydrated, hypotensive, hypokalemic and in acute renal failure. Over the last 5 days we have been giving him IV fluids in the form of NS, Dextrose 5 fluids to keep his BP and repleting his electrolytes.   1. Hypotension from dehydration secondary to nausea and vomiting - Nausea and vomiting resolved. But his BP continues to run borderline. He remains asymptomatic all along.  2. Acute renal failure/ metabolic alkalosis  -  Improved, secondary to ongoing GI loses . closely follow intake output and metabolic panel. Renal consult requested to assist with the management of alkalosis. He is on Iv normal saline 1 liter every 4 hours and dextrose fluids for his hypernatremia. His hypokalemia is improved.  3. Abdominal carcinomatosis - Treatment per his oncologist. Patient presently follows at [facility] wit Dr Leavy Cella and he was recently transferred to Dr Bonney Aid for further chemo. Tried to reach Dr Bonney Aid, but found out that the patient never had chemo under Dr Bonney Aid. Dr Leavy Cella was off and not in his office today. But we have records from Dr Sharrell Ku office in the chart. Not a candidate for chemotherapy but patient insists that his oncologist (Dr. Leavy Cella) plans to continue  chemotherapy.  4. History of gout - presently not having any flares. 5. Hypokalemia: replete as needed.  6. Nutrition on TPN and clear liquid diet.  G tube drain per shift.  7. DVT prophylaxis.   HPI/Subjective: Feeling better this morning  Objective: Filed Vitals:   04/08/13 0300 04/08/13 0400 04/08/13 0500 04/08/13 0600  BP: 120/60 118/64 120/60 119/58  Pulse: 78 78 84 76  Temp:  99.1 F (37.3 C)    TempSrc:  Oral    Resp: 20 25 24 23   Height:      Weight:  93.7 kg (206 lb 9.1 oz)    SpO2: 100% 100% 100% 99%    Intake/Output Summary (Last 24 hours) at 04/08/13 0733 Last data filed at 04/08/13 0653  Gross per 24 hour  Intake 18734.33 ml  Output  81191 ml  Net 4774.33 ml   Filed Weights   04/05/13 0500 04/07/13 0500 04/08/13 0400  Weight: 78 kg (171 lb 15.3 oz) 85.5 kg (188 lb 7.9 oz) 93.7 kg (206 lb 9.1 oz)    Exam:  General: Well-developed and poorly nourished.  Cardiovascular: S1-S2 heard tachycardic.  Respiratory: No rhonchi or crepitations.  Abdomen: colostomy pouch and suction tube in place.  Skin: No new rash.  Musculoskeletal: No edema.   Neurologic: Moves all extremities.   Data Reviewed: Basic Metabolic Panel:  Recent Labs Lab 04/03/13 0600  04/04/13 0441  04/05/13 0930  04/06/13 0400 04/06/13 1600 04/07/13 0500 04/07/13 1830 04/08/13 0500  NA 147*  --  146*  < > 146*  --  143 143 144  --  144  K 2.9*  < > 2.6*  < > 3.2*  < > 2.7* 2.8* 2.8* 3.0* 3.6  CL 94*  --  89*  < > 81*  --  83* 86* 102  --  111  CO2 44*  --  >45*  < > >45*  --  >45* >45* 38*  --  29  GLUCOSE 129*  --  170*  < > 140*  --  109* 113* 104*  --  80  BUN 68*  --  61*  < > 76*  --  77* 72* 55*  --  38*  CREATININE 1.21  --  1.23  < > 1.96*  --  2.16* 2.16* 1.79*  --  1.60*  CALCIUM 8.9  --  9.8  < > 10.1  --  9.0 8.9 7.2*  --  7.4*  MG 2.2  --  2.2  --   --   --  1.9  --  1.3*  --  1.2*  PHOS 5.5*  --  5.5*  --   --   --  6.3*  --  3.0  --  2.3  < > = values in this  interval not displayed. Liver Function Tests:  Recent Labs Lab 04/03/13 0230 04/03/13 0600 04/04/13 0441 04/05/13 0750 04/06/13 0400  AST 54* 42* 41* 66* 49*  ALT 70* 58* 55* 71* 56*  ALKPHOS 401* 335* 336* 380* 289*  BILITOT 0.6 0.6 0.5 0.8 0.8  PROT 7.9 6.7 6.9 7.2 5.8*  ALBUMIN 2.7* 2.5* 2.6* 2.6* 2.1*   No results found for this basename: LIPASE, AMYLASE,  in the last 168 hours No results found for this basename: AMMONIA,  in the last 168 hours CBC:  Recent Labs Lab 04/03/13 0230 04/03/13 0600 04/04/13 0441 04/06/13 0400  WBC 4.1 2.3* 3.2* 3.6*  NEUTROABS 2.1 1.3* 1.9 2.2  HGB 16.9 9.6* 10.0* 8.8*  HCT 49.4 29.7* 30.9* 27.0*  MCV 95.7 97.7 98.7 99.3  PLT 263 274 316 251   Cardiac Enzymes:  Recent Labs Lab 04/03/13 0230  TROPONINI <0.30   BNP (last 3 results)  Recent Labs  12/22/12 1700  PROBNP 33.8   CBG:  Recent Labs Lab 04/07/13 2314 04/07/13 2337 04/07/13 2347 04/08/13 0010 04/08/13 0457  GLUCAP 62* 48* 54* 88 80    Recent Results (from the past 240 hour(s))  CULTURE, BLOOD (ROUTINE X 2)     Status: None   Collection Time    04/03/13  3:00 AM      Result Value Range Status   Specimen Description BLOOD LEFT HAND   Final   Special Requests BOTTLES DRAWN AEROBIC AND ANAEROBIC 5CC   Final   Culture  Setup Time 04/03/2013 08:32   Final   Culture     Final   Value:        BLOOD CULTURE RECEIVED NO GROWTH TO DATE CULTURE WILL BE HELD FOR 5 DAYS BEFORE ISSUING A FINAL NEGATIVE REPORT   Report Status PENDING   Incomplete  CULTURE, BLOOD (ROUTINE X 2)     Status: None   Collection Time    04/03/13  3:05 AM      Result Value Range Status   Specimen Description BLOOD RIGHT HAND   Final   Special Requests BOTTLES DRAWN AEROBIC AND ANAEROBIC 5CC   Final   Culture  Setup Time 04/03/2013 08:32   Final   Culture     Final   Value:  BLOOD CULTURE RECEIVED NO GROWTH TO DATE CULTURE WILL BE HELD FOR 5 DAYS BEFORE ISSUING A FINAL NEGATIVE REPORT    Report Status PENDING   Incomplete  URINE CULTURE     Status: None   Collection Time    04/03/13  3:45 AM      Result Value Range Status   Specimen Description URINE, CLEAN CATCH   Final   Special Requests NONE   Final   Culture  Setup Time 04/03/2013 08:39   Final   Colony Count NO GROWTH   Final   Culture NO GROWTH   Final   Report Status 04/04/2013 FINAL   Final  MRSA PCR SCREENING     Status: Abnormal   Collection Time    04/03/13  5:00 AM      Result Value Range Status   MRSA by PCR POSITIVE (*) NEGATIVE Final   Comment:            The GeneXpert MRSA Assay (FDA     approved for NASAL specimens     only), is one component of a     comprehensive MRSA colonization     surveillance program. It is not     intended to diagnose MRSA     infection nor to guide or     monitor treatment for     MRSA infections.     RESULT CALLED TO, READ BACK BY AND VERIFIED WITH:     C.CAGLE AT 0743 ON 18APR14 BY C.BONGEL     Studies: US Renal  04/06/2013  *RADIOLOGY REPORT*  Clinical Data: Renal insufficiency and history of peritoneal carcinomatosis.  RENAL/URINARY TRACT ULTRASOUND COMPLETE  Comparison:  CT of the abdomen on 12/16/2012.  Findings:  Right Kidney:  The right kidney measures approximately 12.3 cm in length.  Sonographic appearance is normal without evidence of hydronephrosis or focal lesion.  Left Kidney:  The left kidney measures approximately 13 cm in length and has a normal sonographic appearance without evidence of hydronephrosis.  Small cyst of the mid kidney measures 1.2 cm has a benign appearance.  Bladder:  The bladder was mildly distended at the time imaging and unremarkable in appearance.  IMPRESSION: Unremarkable renal ultrasound.  No evidence of hydronephrosis bilaterally.   Original Report Authenticated By: Irish Lack, M.D.     Scheduled Meds: . antiseptic oral rinse  15 mL Mouth Rinse q12n4p  . chlorhexidine  15 mL Mouth Rinse BID  . Chlorhexidine Gluconate Cloth   6 each Topical Q0600  . clindamycin  1 application Topical BID  . enoxaparin (LOVENOX) injection  40 mg Subcutaneous Q24H  . fludrocortisone  0.1 mg Oral BID  . insulin aspart  0-15 Units Subcutaneous Q4H  . magnesium sulfate 1 - 4 g bolus IVPB  2 g Intravenous Once  . midodrine  10 mg Oral TID  . mupirocin ointment  1 application Nasal BID  . pantoprazole  40 mg Oral q morning - 10a  . pregabalin  50 mg Oral TID  . sodium chloride  1,000 mL Intravenous Q12H  . sodium chloride  3 mL Intravenous Q12H  . vitamin C  500 mg Oral q morning - 10a  . zinc sulfate  220 mg Oral q morning - 10a   Continuous Infusions: . Marland KitchenTPN (CLINIMIX-E) Adult 83 mL/hr at 04/07/13 1717  . 0.45 % NaCl with KCl 20 mEq / L 760 mL/hr at 04/08/13 0709  . dextrose 5 % with kcl 50 mL/hr at 04/07/13 1800  Principal Problem:   Dehydration secondary to nausea and vomiting Active Problems:   Abdominal malignant neoplasm   Hypokalemia   Acute kidney injury secondary to dehydration        Pamella Pert  [facility] Pager [phone]. If 7PM-7AM, please contact night-coverage at [email or web address], password [facility] 04/08/2013, 7:33 AM  LOS: 5 days

## 2013-04-08 NOTE — Progress Notes (Signed)
Physical Therapy Treatment Patient Details Name: PORTER MOES MRN: [id number] DOB: [birth year] Today's Date: 04/08/2013 Time: 4098-1191 PT Time Calculation (min): 31 min  PT Assessment / Plan / Recommendation Comments on Treatment Session  Pt limited by medical issues this session; motivated to do more when able. Pt is impulsive and requires direction to slow down during gait to better control HR.    Follow Up Recommendations  SNF;Supervision/Assistance - 24 hour     Frequency Min 3X/week   Plan Discharge plan remains appropriate;Frequency remains appropriate    Precautions / Restrictions Precautions Precautions: Fall Precaution Comments: multiple lines/drains Restrictions Weight Bearing Restrictions: No   Pertinent Vitals/Pain Pt denies pain. Supine vitals: HR 97, O2 100, BP 135/82; EOB: HR 95, O2 100, BP 130/75; During gait: HR 139, O2 94, BP 122/76.  Took a sitting rest break when HR elevated to 139.    Mobility  Bed Mobility Bed Mobility: Supine to Sit Supine to Sit: 4: Min guard;HOB elevated Details for Bed Mobility Assistance: Min/guard to ensure safety of trunk when sitting.  Pt somewhat impulsive with bed mobility.  Transfers Transfers: Sit to Stand;Stand to Sit Sit to Stand: 1: +2 Total assist Sit to Stand: Patient Percentage: 80% Stand to Sit: 4: Min assist;To chair/3-in-1;With upper extremity assist Details for Transfer Assistance: Cues for hand placement, safety; +2 for management of lines and safety Ambulation/Gait Ambulation/Gait Assistance: 1: +2 Total assist Ambulation/Gait: Patient Percentage: 80% Ambulation Distance (Feet): 40 Feet Assistive device: Rolling walker Ambulation/Gait Assistance Details: Pt impulsive with gait. Limited by HR which reached 139 and req'd a rest break. Gait Pattern: Step-through pattern Gait velocity: Increased  General Gait Details: unsteady due to medical issues      PT Goals Acute Rehab PT Goals PT Goal Formulation:  With patient Time For Goal Achievement: 04/11/13 Potential to Achieve Goals: Good Pt will go Sit to Stand: with min assist PT Goal: Sit to Stand - Progress: Progressing toward goal Pt will go Stand to Sit: with min assist PT Goal: Stand to Sit - Progress: Progressing toward goal Pt will Ambulate: 51 - 150 feet;with mod assist;with least restrictive assistive device PT Goal: Ambulate - Progress: Progressing toward goal  Visit Information  Last PT Received On: 04/08/13 Assistance Needed: +2    Subjective Data   I wish you would come every day   Cognition    Good   Balance   Fair  End of Session PT - End of Session Equipment Utilized During Treatment: Gait belt Activity Tolerance: Treatment limited secondary to medical complications (Comment);Other (comment) (HR rose to 139 during gait) Patient left: in chair;with call bell/phone within reach;with nursing in room   GP     BROWN-SMEDLEY, NICOLE 04/08/2013, 1:47 PM  Felecia Shelling  PTA [facility]  Acute  Rehab Pager      [phone]

## 2013-04-09 DIAGNOSIS — E876 Hypokalemia: Secondary | ICD-10-CM

## 2013-04-09 DIAGNOSIS — E871 Hypo-osmolality and hyponatremia: Secondary | ICD-10-CM

## 2013-04-09 LAB — BASIC METABOLIC PANEL
BUN: 33 mg/dL — ABNORMAL HIGH (ref 6–23)
BUN: 31 mg/dL — ABNORMAL HIGH (ref 6–23)
CO2: 23 mEq/L (ref 19–32)
CO2: 20 mEq/L (ref 19–32)
Chloride: 104 mEq/L (ref 96–112)
Chloride: 112 mEq/L (ref 96–112)
Creatinine, Ser: 1.5 mg/dL — ABNORMAL HIGH (ref 0.50–1.35)
GFR calc Af Amer: 63 mL/min — ABNORMAL LOW (ref >90)
Glucose, Bld: 121 mg/dL — ABNORMAL HIGH (ref 70–99)
Glucose, Bld: 120 mg/dL — ABNORMAL HIGH (ref 70–99)
Potassium: 4.4 mEq/L (ref 3.5–5.1)

## 2013-04-09 LAB — CULTURE, BLOOD (ROUTINE X 2): Culture: NO GROWTH

## 2013-04-09 LAB — COMPREHENSIVE METABOLIC PANEL
AST: 28 U/L (ref 0–37)
Albumin: 1.5 g/dL — ABNORMAL LOW (ref 3.5–5.2)
BUN: 34 mg/dL — ABNORMAL HIGH (ref 6–23)
Calcium: 8.4 mg/dL (ref 8.4–10.5)
Creatinine, Ser: 1.52 mg/dL — ABNORMAL HIGH (ref 0.50–1.35)

## 2013-04-09 LAB — MAGNESIUM: Magnesium: 1.3 mg/dL — ABNORMAL LOW (ref 1.5–2.5)

## 2013-04-09 LAB — GLUCOSE, CAPILLARY
Glucose-Capillary: 110 mg/dL — ABNORMAL HIGH (ref 70–99)
Glucose-Capillary: 117 mg/dL — ABNORMAL HIGH (ref 70–99)

## 2013-04-09 LAB — PHOSPHORUS: Phosphorus: 3.2 mg/dL (ref 2.3–4.6)

## 2013-04-09 MED ORDER — OCTREOTIDE ACETATE 100 MCG/ML IJ SOLN
100.0000 ug | Freq: Three times a day (TID) | INTRAMUSCULAR | Status: DC
  Filled 2013-04-09 (×13): qty 1

## 2013-04-09 MED ORDER — SODIUM CHLORIDE 0.9 % IV SOLN
INTRAVENOUS | Status: AC
  Administered 2013-04-09 (×4): via INTRAVENOUS
  Filled 2013-04-09 (×4): qty 1000

## 2013-04-09 MED ORDER — STERILE WATER FOR INJECTION IV SOLN: INTRAVENOUS | Status: DC

## 2013-04-09 MED ORDER — CLINIMIX E/DEXTROSE (5/20) 5 % IV SOLN
INTRAVENOUS | Status: AC
  Administered 2013-04-09: 17:00:00 via INTRAVENOUS
  Filled 2013-04-09: qty 2400

## 2013-04-09 MED ORDER — POTASSIUM CHLORIDE IN NACL 40-0.9 MEQ/L-% IV SOLN
INTRAVENOUS | Status: DC
  Filled 2013-04-09 (×6): qty 1000

## 2013-04-09 MED ORDER — POTASSIUM CHLORIDE IN NACL 40-0.9 MEQ/L-% IV SOLN
650.0000 mL/h | INTRAVENOUS | Status: AC
  Administered 2013-04-09 (×4): 650 mL/h via INTRAVENOUS
  Filled 2013-04-09 (×8): qty 1000

## 2013-04-09 MED ORDER — POTASSIUM CHLORIDE 10 MEQ/50ML IV SOLN: 10.0000 meq | INTRAVENOUS | Status: DC

## 2013-04-09 MED ORDER — MAGNESIUM SULFATE 40 MG/ML IJ SOLN
2.0000 g | Freq: Once | INTRAMUSCULAR | Status: AC
  Administered 2013-04-09: 2 g via INTRAVENOUS
  Filled 2013-04-09: qty 50

## 2013-04-09 MED ORDER — PREGABALIN 50 MG PO CAPS
75.0000 mg | ORAL_CAPSULE | Freq: Three times a day (TID) | ORAL | Status: DC
  Administered 2013-04-09 – 2013-04-17 (×25): 75 mg via ORAL
  Filled 2013-04-09 (×22): qty 1
  Filled 2013-04-09 (×2): qty 3
  Filled 2013-04-09: qty 1

## 2013-04-09 MED ORDER — SODIUM CHLORIDE 0.9 % IV SOLN: INTRAVENOUS | Status: DC

## 2013-04-09 MED ORDER — STERILE WATER FOR INJECTION IV SOLN
INTRAVENOUS | Status: DC
  Administered 2013-04-09: 06:00:00 via INTRAVENOUS
  Filled 2013-04-09 (×3): qty 1000

## 2013-04-09 MED ORDER — POTASSIUM CHLORIDE IN NACL 20-0.9 MEQ/L-% IV SOLN
INTRAVENOUS | Status: DC
  Administered 2013-04-09 – 2013-04-10 (×3): via INTRAVENOUS
  Filled 2013-04-09 (×2): qty 2000

## 2013-04-09 NOTE — Progress Notes (Addendum)
[facility] PROGRESS NOTE  NITISH ROES ZOX:[id number] DOB: 03/14/1956 DOA: 04/03/2013 PCP: [name], MD  Brief HPI:   57 year old gentleman with h/o appendiceal carcinoma, abdominal carcinomatosis, with multiple abdominal surgeries, has a G tube in place attached to drain as needed, an ileostomy and a enterocutaneous fistula, was recently discharged from [facility] medical center after an extended stay of 2 months. During his stay he received chemo every other week and was discharged to [facility] health for rehabilitation. He was brought to [facility] ED after less than a day stay at [facility] for nausea, vomiting and dehydration. On arrival to ED he was found to be dehydrated, hypotensive, hypokalemic and in acute renal failure. Over this hospitalization we have been giving him IV fluids in the form of NS, Dextrose 5 fluids to keep his BP and repleting his electrolytes.    Assessment/Plan:  1. Hypotension from dehydration secondary to nausea and vomiting - Nausea and vomiting resolved.    2. Acute renal failure/ metabolic alkalosis  -  Improved, secondary to ongoing GI loses. Nephrology following, appreciate continuous input.  - difficult to manage his fluid status  Over the past 24 hours: IN (ml): po 220; TPN 2270; D5W with 40 KCl 300; 1/2NS with 20 KCl 2925; 1/4NS with 20 KCl 7163; IV other 1200; TOTAL IN: 14,078 O (ml): UOP 7025; Drains: 7025; Ileostomy 225, Total Out: 14,275  Current drips: TPN 100 cc/h; NS with 40 KCl 650 cc/h x 6 hours.  BMP repeat 1200.  Lab Results  Component Value Date   NA 132* 04/09/2013   NA 144 04/08/2013   NA 144 04/07/2013   NA 143 04/06/2013   NA 143 04/06/2013   NA 146* 04/05/2013   NA 147* 04/05/2013   NA 147* 04/04/2013   NA 147* 04/04/2013   NA 146* 04/04/2013   Lab Results  Component Value Date   K 3.5 04/09/2013   K 3.6 04/08/2013   K 3.0* 04/07/2013   K 2.8* 04/07/2013   K 2.8* 04/06/2013   K 2.7* 04/06/2013    K 2.7* 04/05/2013   K 3.3* 04/05/2013   K 3.2* 04/05/2013   K 3.3* 04/05/2013   3. Abdominal carcinomatosis - Treatment per his oncologist. Patient presently follows at [facility] wit Dr Leavy Cella. Not a candidate for chemotherapy per our assessment but patient insists that his oncologist (Dr. Leavy Cella) plans to continue chemotherapy and he wishes to be stabilized as soon as possible so that he can resume his chemo.  4. History of gout - presently not having any flares. 5. Neuropathic pain - persistent on Lyrica 50 TID, will increase to 75 TID.  6. Hypokalemia: replete as needed.  7. Hyper/hyponatremia - management as above 8. Nutrition on TPN and clear liquid diet. G tube drain per shift.  9. DVT prophylaxis.   HPI/Subjective: - feels "great"  Objective: Filed Vitals:   04/09/13 0330 04/09/13 0348 04/09/13 0500 04/09/13 0700  BP:   112/68 115/69  Pulse:      Temp:  98.9 F (37.2 C)    TempSrc:  Oral    Resp:   25 25  Height:      Weight: 91.6 kg (201 lb 15.1 oz)     SpO2:   100% 100%    Intake/Output Summary (Last 24 hours) at 04/09/13 0716 Last data filed at 04/09/13 0700  Gross per 24 hour  Intake  81191 ml  Output  14275 ml  Net   -  197 ml   Filed Weights   04/07/13 0500 04/08/13 0400 04/09/13 0330  Weight: 85.5 kg (188 lb 7.9 oz) 93.7 kg (206 lb 9.1 oz) 91.6 kg (201 lb 15.1 oz)   Exam:  General: Well-developed and poorly nourished.  Cardiovascular: S1-S2 heard tachycardic.  Respiratory: No rhonchi or crepitations.  Abdomen: colostomy pouch and suction tube in place.  Skin: No new rash.  Musculoskeletal: No edema.   Neurologic: Moves all extremities.  Data Reviewed: Basic Metabolic Panel:  Recent Labs Lab 04/04/13 0441  04/06/13 0400 04/06/13 1600 04/07/13 0500 04/07/13 1830 04/08/13 0500 04/09/13 0345  NA 146*  < > 143 143 144  --  144 132*  K 2.6*  < > 2.7* 2.8* 2.8* 3.0* 3.6 3.5  CL 89*  < > 83* 86* 102  --  111 100  CO2 >45*  < > >45* >45* 38*   --  29 25  GLUCOSE 170*  < > 109* 113* 104*  --  80 104*  BUN 61*  < > 77* 72* 55*  --  38* 34*  CREATININE 1.23  < > 2.16* 2.16* 1.79*  --  1.60* 1.52*  CALCIUM 9.8  < > 9.0 8.9 7.2*  --  7.4* 8.4  MG 2.2  --  1.9  --  1.3*  --  1.2* 1.3*  PHOS 5.5*  --  6.3*  --  3.0  --  2.3 3.2  < > = values in this interval not displayed. Liver Function Tests:  Recent Labs Lab 04/03/13 0600 04/04/13 0441 04/05/13 0750 04/06/13 0400 04/09/13 0345  AST 42* 41* 66* 49* 28  ALT 58* 55* 71* 56* 25  ALKPHOS 335* 336* 380* 289* 192*  BILITOT 0.6 0.5 0.8 0.8 0.5  PROT 6.7 6.9 7.2 5.8* 4.5*  ALBUMIN 2.5* 2.6* 2.6* 2.1* 1.5*   CBC:  Recent Labs Lab 04/03/13 0230 04/03/13 0600 04/04/13 0441 04/06/13 0400  WBC 4.1 2.3* 3.2* 3.6*  NEUTROABS 2.1 1.3* 1.9 2.2  HGB 16.9 9.6* 10.0* 8.8*  HCT 49.4 29.7* 30.9* 27.0*  MCV 95.7 97.7 98.7 99.3  PLT 263 274 316 251   Cardiac Enzymes:  Recent Labs Lab 04/03/13 0230  TROPONINI <0.30   BNP (last 3 results)  Recent Labs  12/22/12 1700  PROBNP 33.8   CBG:  Recent Labs Lab 04/08/13 1230 04/08/13 1540 04/08/13 1938 04/08/13 2341 04/09/13 0345  GLUCAP 63* 65* 98 110* 110*    Recent Results (from the past 240 hour(s))  CULTURE, BLOOD (ROUTINE X 2)     Status: None   Collection Time    04/03/13  3:00 AM      Result Value Range Status   Specimen Description BLOOD LEFT HAND   Final   Special Requests BOTTLES DRAWN AEROBIC AND ANAEROBIC 5CC   Final   Culture  Setup Time 04/03/2013 08:32   Final   Culture     Final   Value:        BLOOD CULTURE RECEIVED NO GROWTH TO DATE CULTURE WILL BE HELD FOR 5 DAYS BEFORE ISSUING A FINAL NEGATIVE REPORT   Report Status PENDING   Incomplete  CULTURE, BLOOD (ROUTINE X 2)     Status: None   Collection Time    04/03/13  3:05 AM      Result Value Range Status   Specimen Description BLOOD RIGHT HAND   Final   Special Requests BOTTLES DRAWN AEROBIC AND ANAEROBIC 5CC   Final   Culture  Setup  Time  04/03/2013 08:32   Final   Culture     Final   Value:        BLOOD CULTURE RECEIVED NO GROWTH TO DATE CULTURE WILL BE HELD FOR 5 DAYS BEFORE ISSUING A FINAL NEGATIVE REPORT   Report Status PENDING   Incomplete  URINE CULTURE     Status: None   Collection Time    04/03/13  3:45 AM      Result Value Range Status   Specimen Description URINE, CLEAN CATCH   Final   Special Requests NONE   Final   Culture  Setup Time 04/03/2013 08:39   Final   Colony Count NO GROWTH   Final   Culture NO GROWTH   Final   Report Status 04/04/2013 FINAL   Final  MRSA PCR SCREENING     Status: Abnormal   Collection Time    04/03/13  5:00 AM      Result Value Range Status   MRSA by PCR POSITIVE (*) NEGATIVE Final   Comment:            The GeneXpert MRSA Assay (FDA     approved for NASAL specimens     only), is one component of a     comprehensive MRSA colonization     surveillance program. It is not     intended to diagnose MRSA     infection nor to guide or     monitor treatment for     MRSA infections.     RESULT CALLED TO, READ BACK BY AND VERIFIED WITH:     C.CAGLE AT 0743 ON 18APR14 BY C.BONGEL   Studies: No results found.  Scheduled Meds: . antiseptic oral rinse  15 mL Mouth Rinse q12n4p  . chlorhexidine  15 mL Mouth Rinse BID  . clindamycin  1 application Topical BID  . enoxaparin (LOVENOX) injection  40 mg Subcutaneous Q24H  . fludrocortisone  0.1 mg Oral BID  . insulin aspart  0-15 Units Subcutaneous Q4H  . midodrine  10 mg Oral TID  . pantoprazole  40 mg Oral q morning - 10a  . pregabalin  50 mg Oral TID  . sodium chloride  3 mL Intravenous Q12H  . vitamin C  500 mg Oral q morning - 10a  . zinc sulfate  220 mg Oral q morning - 10a   Continuous Infusions: . Marland KitchenTPN (CLINIMIX-E) Adult 100 mL/hr at 04/08/13 1743  . Marland KitchenTPN (CLINIMIX-E) Adult    . 0.9 % NaCl with KCl 40 mEq / L 650 mL/hr (04/09/13 0835)  . fat emulsion 250 mL (04/08/13 1742)   Principal Problem:   Dehydration secondary  to nausea and vomiting Active Problems:   Abdominal malignant neoplasm   Hypokalemia   Acute kidney injury secondary to dehydration  Total time: 35 min  GHERGHE, COSTIN  [facility] Pager [phone]. If 7PM-7AM, please contact night-coverage at [email or web address], password [facility] 04/09/2013, 7:16 AM  LOS: 6 days

## 2013-04-09 NOTE — Progress Notes (Signed)
Patient ID: [name], male   DOB: June 27, 1956, 57 y.o.   MRN: [id number]   [facility] Progress Note    Subjective:   States that he feels well and denies any emerging complaints- tolerable abdominal discomfort   Objective:   BP 117/62  Pulse 91  Temp(Src) 99.4 F (37.4 C) (Oral)  Resp 26  Ht 6\' 4"  (1.93 m)  Wt 91.6 kg (201 lb 15.1 oz)  BMI 24.59 kg/m2  SpO2 100%  Intake/Output Summary (Last 24 hours) at 04/09/13 1244 Last data filed at 04/09/13 1200  Gross per 24 hour  Intake  13086 ml  Output  14625 ml  Net   -582 ml   Weight change: -2.1 kg (-4 lb 10.1 oz)  Physical Exam: VHQ:[id number] resting in bed LKG:MWNUU RRR, normal S1 and S2  Resp:CTA bilaterally, no rales/rhonchi VOZ:DGUY, mildly tender Ext: No LE edema  Imaging: No results found.  Labs: BMET  Recent Labs Lab 04/03/13 0600  04/04/13 0441  04/05/13 0930  04/06/13 0400 04/06/13 1600 04/07/13 0500 04/07/13 1830 04/08/13 0500 04/09/13 0345 04/09/13 1157  NA 147*  --  146*  < > 146*  --  143 143 144  --  144 132* 133*  K 2.9*  < > 2.6*  < > 3.2*  < > 2.7* 2.8* 2.8* 3.0* 3.6 3.5 4.4  CL 94*  --  89*  < > 81*  --  83* 86* 102  --  111 100 104  CO2 44*  --  >45*  < > >45*  --  >45* >45* 38*  --  29 25 23   GLUCOSE 129*  --  170*  < > 140*  --  109* 113* 104*  --  80 104* 121*  BUN 68*  --  61*  < > 76*  --  77* 72* 55*  --  38* 34* 33*  CREATININE 1.21  --  1.23  < > 1.96*  --  2.16* 2.16* 1.79*  --  1.60* 1.52* 1.50*  CALCIUM 8.9  --  9.8  < > 10.1  --  9.0 8.9 7.2*  --  7.4* 8.4 8.3*  PHOS 5.5*  --  5.5*  --   --   --  6.3*  --  3.0  --  2.3 3.2  --   < > = values in this interval not displayed. CBC  Recent Labs Lab 04/03/13 0230 04/03/13 0600 04/04/13 0441 04/06/13 0400  WBC 4.1 2.3* 3.2* 3.6*  NEUTROABS 2.1 1.3* 1.9 2.2  HGB 16.9 9.6* 10.0* 8.8*  HCT 49.4 29.7* 30.9* 27.0*  MCV 95.7 97.7 98.7 99.3  PLT 263 274 316 251    Medications:    . antiseptic oral  rinse  15 mL Mouth Rinse q12n4p  . chlorhexidine  15 mL Mouth Rinse BID  . clindamycin  1 application Topical BID  . enoxaparin (LOVENOX) injection  40 mg Subcutaneous Q24H  . fludrocortisone  0.1 mg Oral BID  . insulin aspart  0-15 Units Subcutaneous Q4H  . midodrine  10 mg Oral TID  . pantoprazole  40 mg Oral q morning - 10a  . pregabalin  75 mg Oral TID  . sodium chloride  3 mL Intravenous Q12H  . vitamin C  500 mg Oral q morning - 10a  . zinc sulfate  220 mg Oral q morning - 10a     Assessment/ Plan:   1. AKI: Secondary to GI losses and volume depletion---with excellent UOP  and improving with fluids-- continues to have large GI output ?sandostatin 2. Hypokalemia/metabolic alkalosis: due to volume depletion and Gastric losses- improved s/p IVF resuscitation/ potassium repletion 3. Hyponatremia: Iatrogenic from hypotonic fluids and I earlier switched him to isotonic fluid- saline to match output 1cc to 1cc. 4. Abdominal Carcinomatosis.   Zetta Bills, MD 04/09/2013, 12:44 PM

## 2013-04-09 NOTE — Progress Notes (Signed)
[id number] Earlene Plater, RN, BSN, CCM:  CHART REVIEWED AND UPDATED.  Next chart review due on [id number]. NO DISCHARGE NEEDS PRESENT AT THIS TIME. CASE MANAGEMENT [phone]

## 2013-04-09 NOTE — Progress Notes (Signed)
At 2000, Jejunostomy output was  1,100 ml,  bilious, and urinary output voided was .  Replacement fluids of 1/4th saline with Potassium will be 337ml/hr over the next eight hours.

## 2013-04-09 NOTE — Progress Notes (Signed)
Events noted. Patient still has severe metabolic derangement and acute kidney injury.  He is still hospitalized in the ICU. I do not feel that is safe to treat him with chemotherapy nor do I think it will help on the short run.  I tried to reach his son [name] and will try again to update him.  I encourge  he follows up with his out side oncologist (that gave him chemotherapy) upon discharge.

## 2013-04-09 NOTE — Progress Notes (Signed)
Physical Therapy Treatment Patient Details Name: [name] MRN: [id number] DOB: November 18, 1956 Today's Date: 04/09/2013 Time: 0865-7846 PT Time Calculation (min): 33 min  PT Assessment / Plan / Recommendation Comments on Treatment Session  Pt upset that his bed has been wet "from one of these drains".  Reported to RN.  Assisted pt OOB to amb 2 short walks.  Pt progressing slowly and plans to D/C to SNF.    Follow Up Recommendations  SNF;Supervision/Assistance - 24 hour     Does the patient have the potential to tolerate intense rehabilitation     Barriers to Discharge        Equipment Recommendations  None recommended by PT    Recommendations for Other Services    Frequency Min 3X/week   Plan Discharge plan remains appropriate;Frequency remains appropriate    Precautions / Restrictions Precautions Precautions: Fall Precaution Comments: multiple lines/drains Restrictions Weight Bearing Restrictions: No   Pertinent Vitals/Pain No c/o pain    Mobility  Bed Mobility Bed Mobility: Supine to Sit Supine to Sit: 4: Min guard;HOB elevated Details for Bed Mobility Assistance: Min/guard to ensure safety of trunk when sitting.  Pt somewhat impulsive with bed mobility.  Transfers Transfers: Sit to Stand;Stand to Sit Sit to Stand: 1: +2 Total assist;From bed Sit to Stand: Patient Percentage: 80% Stand to Sit: 4: Min assist;To chair/3-in-1;With upper extremity assist Details for Transfer Assistance: Cues for hand placement, safety; +2 for management of lines and safety Ambulation/Gait Ambulation/Gait Assistance: 1: +2 Total assist Ambulation/Gait: Patient Percentage: 80% Ambulation Distance (Feet): 75 Feet (40' then another 36') Assistive device: Rolling walker Ambulation/Gait Assistance Details: Pt anxious/nervous and required increased time and increased cueing to decrease RR.  Shaky/unsteady and fatigues quickly. Gait Pattern: Step-through pattern     PT Goals                                          progressing    Visit Information  Last PT Received On: 04/09/13 Assistance Needed: +2    Subjective Data  Subjective: I am all wet and have been wet all night Patient Stated Goal: home   Cognition    good   Balance   fair -  End of Session PT - End of Session Equipment Utilized During Treatment: Gait belt Activity Tolerance: Treatment limited secondary to medical complications (Comment);Other (comment) Patient left: in chair;with call bell/phone within reach;with nursing in room Nurse Communication: Mobility status   Felecia Shelling  PTA [facility]  Acute  Rehab Pager      [phone]

## 2013-04-09 NOTE — Clinical Social Work Note (Signed)
CSW continues to follow for SNF placement. CSW met with MD to explain lack of LTAC options due to Pt's insurance not covering LTAC admission. CSW discussed case with RN CM and she will double check with Pt's insurance as well. CSW has SNF option when Pt is medically stable for discharge. CSW will follow for SNF and support.   Doreen Salvage, LCSW ICU/Stepdown Clinical Social Worker [facility] Cell [phone] Hours 8am-1200pm M-F

## 2013-04-09 NOTE — Progress Notes (Addendum)
PARENTERAL NUTRITION CONSULT NOTE - FOLLOW UP  Pharmacy Consult for TPN Indication: enterocutaneous fistula, abdominal carcinomatosis  Allergies  Allergen Reactions  . Percocet (Oxycodone-Acetaminophen) Nausea And Vomiting    hallucination   Patient Measurements: Height: 6\' 4"  (193 cm) Weight: 201 lb 15.1 oz (91.6 kg) IBW/kg (Calculated) : 86.8 Usual Weight: 170-180 lbs  Vital Signs: Temp: 98.9 F (37.2 C) (04/24 0348) Temp src: Oral (04/24 0348) BP: 115/69 mmHg (04/24 0700) Intake/Output from previous day: 04/23 0701 - 04/24 0700 In: 45409 [P.O.:220; I.V.:10388; IV Piggyback:1200; TPN:2270] Out: 81191 [Urine:7025; Drains:7025; Stool:225] Intake/Output from this shift:    Labs: No results found for this basename: WBC, HGB, HCT, PLT, APTT, INR,  in the last 72 hours  Recent Labs  04/07/13 0500 04/07/13 1830 04/08/13 0500 04/09/13 0345  NA 144  --  144 132*  K 2.8* 3.0* 3.6 3.5  CL 102  --  111 100  CO2 38*  --  29 25  GLUCOSE 104*  --  80 104*  BUN 55*  --  38* 34*  CREATININE 1.79*  --  1.60* 1.52*  CALCIUM 7.2*  --  7.4* 8.4  MG 1.3*  --  1.2* 1.3*  PHOS 3.0  --  2.3 3.2  PROT  --   --   --  4.5*  ALBUMIN  --   --   --  1.5*  AST  --   --   --  28  ALT  --   --   --  25  ALKPHOS  --   --   --  192*  BILITOT  --   --   --  0.5   Estimated Creatinine Clearance: 66.6 ml/min (by C-G formula based on Cr of 1.52).    Recent Labs  04/08/13 1938 04/08/13 2341 04/09/13 0345  GLUCAP 98 110* 110*    Insulin Requirements in the past 24 hours:  No SSI required, hypoglycemia improved Regular insulin removed from TPN  IVF per Nephrology: 1/4NS with [facility] for cc:cc replacement of GI losses D5 with 40KCl at 29ml/hr (D/C'd 4/23) NS 1L boluses q12h (D/C'd 4/23)  Current Nutrition:  Clinimix 5/20 at 100 ml/hr + lipids at 10 ml/hr MWF.  Nutritional Goals:  RD recommendations (4/18): Kcal: 2220-2565 Protein: 100-132 grams Fluid: 2.3-2.6 L   Can achieve these  nutrition goals with Clinimix E5/20 at a goal rate of 100 ml/hr + IVFE 20% at 10 ml/hr on MWF to provide: 120g/day protein, 2318 Kcal/day avg.(2112 Kcal/day STTHS, 2592 Kcal/day MWF).    Assessment:  30 yoM with complicated PMH of metastatic adenocarcinoma, peritoneal carcinomatosis s/p extensive debulking procedures and abdominal surgeries which has left him with multiple drains and fistulas on chronic TPN via a PICC line. Discharged from [facility] recently and was transferred to his nursing home in [place] started developing N/V and weakness. Was receiving TPN cycled over 18 hours at [facility]. Will continue TPN inpatient but will be difficult to provide large protein and kcal amounts with premixed Clinimix TNAs.  RD evaluated the patient 4/18 and recommended a reduced protein goal than PTA.  Pharmacy will be able to meet new nutrition goals with the premixed Clinimix product.  Will provide TNA continuously inpatient due to high volume that needs to be administered with premixed product.  Labs:  CBGs: hypoglycemia resolved with insulin removed from TPN and increased TPN rate.  Electrolytes: K+ finally WNL after massive replacement. Na decreased 144-132 (suspect 1/4NS contributing). Phos elevated 4/21, but now WNL.  Mg remains low despite replacement last 2 days  LFTs: AST/ALT WNL, Alk Phos remains elevated (4/21)  TGs: 195 (4/19), 177 (4/21)  Prealbumin: 33 (4/19), 28.2 (4/21)  Renal: ARF from hypotension/volume depletion; SCr now improving. I/O=13.7L/14.4L (-834ml/24h), UOP=7.2L, drains= 7L. Renal following (contraction alkalosis resolved from admission, d/t severe volume depletion)  Plan:  At 1800 today:  Continue Clinimix E5/20 to goal 114ml/hr (rate increased 4/23 pm) Low Mg, order Magnesium sulfate 2gm IV today KCl x 4 runs today to prevent drop in K+ Continue no insulin in TPN - may need to add back since TNA rate increased 4/23 pm (watch CBGs). Cont SSI.  Fat emulsion  at 10 ml/hr (MWF only due to ongoing shortage).  TNA to contain standard multivitamins and trace elements (MWF only due to ongoing shortage). Will continue to use IV as unsure of enteral absorption given massive GI losses. Also vitamin C and zinc continued from home. MIVF per MD.  TNA lab panels on Mondays & Thursdays.    Juliette Alcide, PharmD, BCPS.   Pager: [phone] 04/09/2013 7:19 AM   Addendum:  Orders to change replacement fluid to NS + KCl 1:1 replacement for losses. Due to increase KCl in IVF will d/c KCL runs. Labs ordered for am  Juliette Alcide, PharmD, BCPS.   Pager: [phone] 04/09/2013 8:34 AM

## 2013-04-09 NOTE — Progress Notes (Signed)
At 0200 ,Jejunostomy output was , and urinary output was . Current replacement rate until 0800 is 527ml/hr.

## 2013-04-10 DIAGNOSIS — K56609 Unspecified intestinal obstruction, unspecified as to partial versus complete obstruction: Secondary | ICD-10-CM

## 2013-04-10 DIAGNOSIS — D61818 Other pancytopenia: Secondary | ICD-10-CM

## 2013-04-10 LAB — PHOSPHORUS: Phosphorus: 4.9 mg/dL — ABNORMAL HIGH (ref 2.3–4.6)

## 2013-04-10 LAB — MAGNESIUM: Magnesium: 1.5 mg/dL (ref 1.5–2.5)

## 2013-04-10 LAB — CBC
Hemoglobin: 6.2 g/dL — CL (ref 13.0–17.0)
MCHC: 33.3 g/dL (ref 30.0–36.0)
RDW: 18.3 % — ABNORMAL HIGH (ref 11.5–15.5)

## 2013-04-10 LAB — GLUCOSE, CAPILLARY
Glucose-Capillary: 110 mg/dL — ABNORMAL HIGH (ref 70–99)
Glucose-Capillary: 125 mg/dL — ABNORMAL HIGH (ref 70–99)
Glucose-Capillary: 128 mg/dL — ABNORMAL HIGH (ref 70–99)
Glucose-Capillary: 123 mg/dL — ABNORMAL HIGH (ref 70–99)
Glucose-Capillary: 113 mg/dL — ABNORMAL HIGH (ref 70–99)

## 2013-04-10 LAB — BASIC METABOLIC PANEL
CO2: 21 mEq/L (ref 19–32)
Calcium: 8.4 mg/dL (ref 8.4–10.5)
Sodium: 143 mEq/L (ref 135–145)

## 2013-04-10 LAB — PREPARE RBC (CROSSMATCH)

## 2013-04-10 MED ORDER — POTASSIUM CHLORIDE IN NACL 20-0.9 MEQ/L-% IV SOLN
INTRAVENOUS | Status: DC
  Administered 2013-04-10 – 2013-04-11 (×10): via INTRAVENOUS
  Filled 2013-04-10 (×12): qty 1000

## 2013-04-10 MED ORDER — TRACE MINERALS CR-CU-F-FE-I-MN-MO-SE-ZN IV SOLN
INTRAVENOUS | Status: AC
  Administered 2013-04-10: 17:00:00 via INTRAVENOUS
  Filled 2013-04-10: qty 2400

## 2013-04-10 MED ORDER — FAT EMULSION 20 % IV EMUL
250.0000 mL | INTRAVENOUS | Status: AC
  Administered 2013-04-10: 250 mL via INTRAVENOUS
  Filled 2013-04-10: qty 250

## 2013-04-10 MED ORDER — MAGNESIUM SULFATE 40 MG/ML IJ SOLN
2.0000 g | Freq: Once | INTRAMUSCULAR | Status: AC
  Administered 2013-04-10: 2 g via INTRAVENOUS
  Filled 2013-04-10: qty 50

## 2013-04-10 NOTE — Progress Notes (Signed)
Pt awoke at 5:45 with nausea. G tube continuous suction turned back on, 350cc immediately returned. 12.5 mg IV Phenergen given for nausea. Emotional support provided for being able to tolerate G tube without suction for 5 hrs 45 mins.

## 2013-04-10 NOTE — Progress Notes (Signed)
[facility] PROGRESS NOTE  [name] WUJ:[id number] DOB: Jul 19, 1956 DOA: 04/03/2013 PCP: [name], MD  Brief HPI:   57 year old gentleman with h/o appendiceal carcinoma, abdominal carcinomatosis, with multiple abdominal surgeries, has a G tube in place attached to drain as needed, an ileostomy and a enterocutaneous fistula, was recently discharged from [facility] center after an extended stay of 2 months. During his stay he received chemo every other week and was discharged to [facility] health for rehabilitation. He was brought to [facility] ED after less than a day stay at [facility] for nausea, vomiting and dehydration. On arrival to ED he was found to be dehydrated, hypotensive, hypokalemic and in acute renal failure. Over this hospitalization we have been giving him IV fluids in the form of NS, Dextrose 5 fluids to keep his BP and repleting his electrolytes.    Assessment/Plan:  1. Hypotension from dehydration secondary to nausea and vomiting - Nausea and vomiting resolved.  - G tube to suction most time, did tolerate clamping for few hours - wants to try some ice cream, OK with that.    2. Acute renal failure/ metabolic alkalosis  -  Improved, secondary to ongoing GI loses. Nephrology following, appreciate continuous input.  - difficult to manage his fluid status - have tried to use Octreotide to decrease gi losses, patient refused.   Over the past 24 hours: IN (ml): po 60; TPN 2520; NS with 20 KCl 3267; NS with 40 KCl IV other 4550; TOTAL IN: 95621 O (ml): UOP 7850; Drains: 4250; Emesis/NG 300, Total Out: 12,450  Current drips: TPN 100 cc/h; NS with 20 KCl 480 cc/h continuous.   Lab Results  Component Value Date   NA 143 04/10/2013   NA 139 04/09/2013   NA 133* 04/09/2013   NA 132* 04/09/2013   NA 144 04/08/2013   NA 144 04/07/2013   NA 143 04/06/2013   NA 143 04/06/2013   NA 146* 04/05/2013   NA 147* 04/05/2013   Lab Results   Component Value Date   K 4.1 04/10/2013   K 4.4 04/09/2013   K 4.4 04/09/2013   K 3.5 04/09/2013   K 3.6 04/08/2013   K 3.0* 04/07/2013   K 2.8* 04/07/2013   K 2.8* 04/06/2013   K 2.7* 04/06/2013   K 2.7* 04/05/2013   - hopefully the continuous drip will be enough for stability and be able to move to floor in the morning and can think about rehab soon  3. Abdominal carcinomatosis - Treatment per his oncologist. Patient presently follows at [facility] wit Dr Leavy Cella. Not a candidate for chemotherapy per our assessment but patient insists that his oncologist (Dr. Leavy Cella) plans to continue chemotherapy and he wishes to be stabilized as soon as possible so that he can resume his chemo.  - patient asked today if there is anyone else from oncology in the area that is willing to offer him chemo.  4. History of gout - presently not having any flares. 5. Neuropathic pain - persistent on Lyrica 50 TID, will increase to 75 TID.  6. Hypokalemia: replete as needed.  7. Hyper/hyponatremia - management as above 8. Nutrition on TPN and clear liquid diet. G tube drain per shift.  9. DVT prophylaxis.   HPI/Subjective: - feels "great", wants to try some ice cream  Objective: Filed Vitals:   04/09/13 2000 04/09/13 2322 04/10/13 0000 04/10/13 0402  BP:  129/79  111/72  Pulse:  Temp: 98.5 F (36.9 C)  98.7 F (37.1 C) 98.5 F (36.9 C)  TempSrc: Oral  Oral Oral  Resp: 29  23 29   Height:      Weight:      SpO2: 100%  100% 100%    Intake/Output Summary (Last 24 hours) at 04/10/13 0713 Last data filed at 04/10/13 0600  Gross per 24 hour  Intake 14993.5 ml  Output  16109 ml  Net 2543.5 ml   Filed Weights   04/07/13 0500 04/08/13 0400 04/09/13 0330  Weight: 85.5 kg (188 lb 7.9 oz) 93.7 kg (206 lb 9.1 oz) 91.6 kg (201 lb 15.1 oz)   Exam:  General: Well-developed and poorly nourished.  Cardiovascular: S1-S2 heard tachycardic.  Respiratory: No rhonchi or crepitations.  Abdomen: colostomy  pouch and suction tube in place.  Skin: No new rash.  Musculoskeletal: No edema.   Neurologic: Moves all extremities.  Data Reviewed: Basic Metabolic Panel:  Recent Labs Lab 04/06/13 0400  04/07/13 0500  04/08/13 0500 04/09/13 0345 04/09/13 1157 04/09/13 2100 04/10/13 0430  NA 143  < > 144  --  144 132* 133* 139 143  K 2.7*  < > 2.8*  < > 3.6 3.5 4.4 4.4 4.1  CL 83*  < > 102  --  111 100 104 112 116*  CO2 >45*  < > 38*  --  29 25 23 20 21   GLUCOSE 109*  < > 104*  --  80 104* 121* 120* 130*  BUN 77*  < > 55*  --  38* 34* 33* 31* 30*  CREATININE 2.16*  < > 1.79*  --  1.60* 1.52* 1.50* 1.41* 1.34  CALCIUM 9.0  < > 7.2*  --  7.4* 8.4 8.3* 8.3* 8.4  MG 1.9  --  1.3*  --  1.2* 1.3*  --   --  1.5  PHOS 6.3*  --  3.0  --  2.3 3.2  --   --  4.9*  < > = values in this interval not displayed. Liver Function Tests:  Recent Labs Lab 04/04/13 0441 04/05/13 0750 04/06/13 0400 04/09/13 0345  AST 41* 66* 49* 28  ALT 55* 71* 56* 25  ALKPHOS 336* 380* 289* 192*  BILITOT 0.5 0.8 0.8 0.5  PROT 6.9 7.2 5.8* 4.5*  ALBUMIN 2.6* 2.6* 2.1* 1.5*   CBC:  Recent Labs Lab 04/04/13 0441 04/06/13 0400 04/10/13 0430  WBC 3.2* 3.6* 2.3*  NEUTROABS 1.9 2.2  --   HGB 10.0* 8.8* 6.2*  HCT 30.9* 27.0* 18.6*  MCV 98.7 99.3 96.4  PLT 316 251 176   BNP (last 3 results)  Recent Labs  12/22/12 1700  PROBNP 33.8   CBG:  Recent Labs Lab 04/09/13 1142 04/09/13 1556 04/09/13 1937 04/10/13 0027 04/10/13 0403  GLUCAP 118* 103* 110* 125* 116*    Recent Results (from the past 240 hour(s))  CULTURE, BLOOD (ROUTINE X 2)     Status: None   Collection Time    04/03/13  3:00 AM      Result Value Range Status   Specimen Description BLOOD LEFT HAND   Final   Special Requests BOTTLES DRAWN AEROBIC AND ANAEROBIC 5CC   Final   Culture  Setup Time 04/03/2013 08:32   Final   Culture NO GROWTH 5 DAYS   Final   Report Status 04/09/2013 FINAL   Final  CULTURE, BLOOD (ROUTINE X 2)     Status: None    Collection Time  04/03/13  3:05 AM      Result Value Range Status   Specimen Description BLOOD RIGHT HAND   Final   Special Requests BOTTLES DRAWN AEROBIC AND ANAEROBIC 5CC   Final   Culture  Setup Time 04/03/2013 08:32   Final   Culture NO GROWTH 5 DAYS   Final   Report Status 04/09/2013 FINAL   Final  URINE CULTURE     Status: None   Collection Time    04/03/13  3:45 AM      Result Value Range Status   Specimen Description URINE, CLEAN CATCH   Final   Special Requests NONE   Final   Culture  Setup Time 04/03/2013 08:39   Final   Colony Count NO GROWTH   Final   Culture NO GROWTH   Final   Report Status 04/04/2013 FINAL   Final  MRSA PCR SCREENING     Status: Abnormal   Collection Time    04/03/13  5:00 AM      Result Value Range Status   MRSA by PCR POSITIVE (*) NEGATIVE Final   Comment:            The GeneXpert MRSA Assay (FDA     approved for NASAL specimens     only), is one component of a     comprehensive MRSA colonization     surveillance program. It is not     intended to diagnose MRSA     infection nor to guide or     monitor treatment for     MRSA infections.     RESULT CALLED TO, READ BACK BY AND VERIFIED WITH:     C.CAGLE AT 0743 ON [id number] BY C.BONGEL   Studies: No results found.  Scheduled Meds: . antiseptic oral rinse  15 mL Mouth Rinse q12n4p  . chlorhexidine  15 mL Mouth Rinse BID  . clindamycin  1 application Topical BID  . enoxaparin (LOVENOX) injection  40 mg Subcutaneous Q24H  . fludrocortisone  0.1 mg Oral BID  . insulin aspart  0-15 Units Subcutaneous Q4H  . midodrine  10 mg Oral TID  . octreotide  100 mcg Subcutaneous Q8H  . pantoprazole  40 mg Oral q morning - 10a  . pregabalin  75 mg Oral TID  . sodium chloride  3 mL Intravenous Q12H  . vitamin C  500 mg Oral q morning - 10a  . zinc sulfate  220 mg Oral q morning - 10a   Continuous Infusions: . Marland KitchenTPN (CLINIMIX-E) Adult 100 mL/hr at 04/09/13 1704  . 0.9 % NaCl with KCl 20 mEq / L  480 mL/hr at 04/10/13 0541   Principal Problem:   Dehydration secondary to nausea and vomiting Active Problems:   Abdominal malignant neoplasm   Hypokalemia   Acute kidney injury secondary to dehydration  Total time: 35 min  Pamella Pert  [facility] Pager [phone]. If 7PM-7AM, please contact night-coverage at [email or web address], password [facility] 04/10/2013, 7:13 AM  LOS: 7 days

## 2013-04-10 NOTE — Progress Notes (Signed)
PARENTERAL NUTRITION CONSULT NOTE - FOLLOW UP  Pharmacy Consult for TPN Indication: enterocutaneous fistula, abdominal carcinomatosis  Allergies  Allergen Reactions  . Percocet (Oxycodone-Acetaminophen) Nausea And Vomiting    hallucination   Patient Measurements: Height: 6\' 4"  (193 cm) Weight: 201 lb 15.1 oz (91.6 kg) IBW/kg (Calculated) : 86.8 Usual Weight: 170-180 lbs  Vital Signs: Temp: 98.5 F (36.9 C) (04/25 0402) Temp src: Oral (04/25 0402) BP: 111/72 mmHg (04/25 0402) Intake/Output from previous day: 04/24 0701 - 04/25 0700 In: 14993.5 [P.O.:60; I.V.:12463.5; IV Piggyback:50; TPN:2420] Out: 16109 [Urine:7850; Emesis/NG output:300; Drains:4250; Stool:50] Intake/Output from this shift:    Labs:  Recent Labs  04/10/13 0430  WBC 2.3*  HGB 6.2*  HCT 18.6*  PLT 176    Recent Labs  04/08/13 0500 04/09/13 0345 04/09/13 1157 04/09/13 2100 04/10/13 0430  NA 144 132* 133* 139 143  K 3.6 3.5 4.4 4.4 4.1  CL 111 100 104 112 116*  CO2 29 25 23 20 21   GLUCOSE 80 104* 121* 120* 130*  BUN 38* 34* 33* 31* 30*  CREATININE 1.60* 1.52* 1.50* 1.41* 1.34  CALCIUM 7.4* 8.4 8.3* 8.3* 8.4  MG 1.2* 1.3*  --   --  1.5  PHOS 2.3 3.2  --   --  4.9*  PROT  --  4.5*  --   --   --   ALBUMIN  --  1.5*  --   --   --   AST  --  28  --   --   --   ALT  --  25  --   --   --   ALKPHOS  --  192*  --   --   --   BILITOT  --  0.5  --   --   --    Estimated Creatinine Clearance: 75.6 ml/min (by C-G formula based on Cr of 1.34).    Recent Labs  04/09/13 1937 04/10/13 0027 04/10/13 0403  GLUCAP 110* 125* 116*    Insulin Requirements in the past 24 hours:  2units SSI required/24h, hypoglycemia resolved Regular insulin removed from TPN 4/23  IVF per Nephrology: NS with 20KCl for cc:cc replacement of GI losses D5 with 40KCl at 73ml/hr (D/C'd 4/23) NS 1L boluses q12h (D/C'd 4/23)  Current Nutrition:  Clinimix 5/20 at 100 ml/hr + lipids at 10 ml/hr MWF.  Nutritional Goals:   RD recommendations (4/18): Kcal: 2220-2565 Protein: 100-132 grams Fluid: 2.3-2.6 L   Can achieve these nutrition goals with Clinimix E5/20 at a goal rate of 100 ml/hr + IVFE 20% at 10 ml/hr on MWF to provide: 120g/day protein, 2318 Kcal/day avg.(2112 Kcal/day STTHS, 2592 Kcal/day MWF).    Assessment:  89 yoM with complicated PMH of metastatic adenocarcinoma, peritoneal carcinomatosis s/p extensive debulking procedures and abdominal surgeries which has left him with multiple drains and fistulas on chronic TPN via a PICC line. Discharged from [facility] recently and was transferred to his nursing home in [place] started developing N/V and weakness. Was receiving TPN cycled over 18 hours at [facility]. Will continue TPN inpatient but will be difficult to provide large protein and kcal amounts with premixed Clinimix TNAs.  RD evaluated the patient 4/18 and recommended a reduced protein goal than PTA.  Pharmacy will be able to meet new nutrition goals with the premixed Clinimix product.  Will provide TNA continuously inpatient due to high volume that needs to be administered with premixed product.  Labs:  CBGs: hypoglycemia resolved with insulin removed from  TPN 4/23 and increased TPN rate.  Electrolytes: K+  WNL after massive replacement. Na trending up. Phos elevated = 4.9, Corr Ca = 10.4 (Phos x Ca = 51).  Mg remains low end normal despite replacement last several days  LFTs: AST/ALT WNL, Alk Phos remains elevated (4/21)  TGs: 195 (4/19), 177 (4/21)  Prealbumin: 33 (4/19), 28.2 (4/21)  Renal: ARF from hypotension/volume depletion; SCr now improved to WNL. I/O=15.6L/12.45L (+3.1Lml/24h), UOP=7.85L, drains= 4.25L. Renal following (contraction alkalosis resolved from admission, d/t severe volume depletion). Octreotide started 4/24 in an attempt to reduce drainage output  Plan:  At 1800 today:  Continue Clinimix E5/20 at goal 1110ml/hr (rate increased 4/23 pm) Mg at low end normal, order  Magnesium sulfate 2gm IV today Continue to monitor phos, may need to remove all lytes from TPN if continues to rise Consider changing IVF to 1/2NS + KCl d/t Na trending up Continue no insulin in TPN - may need to add back since TNA rate increased 4/23 pm (watch CBGs). Cont SSI.  Fat emulsion at 10 ml/hr (MWF only due to ongoing shortage).  TNA to contain standard multivitamins and trace elements (MWF only due to ongoing shortage). Will continue to use IV as unsure of enteral absorption given massive GI losses. Also vitamin C and zinc continued from home..  TNA lab panels on Mondays & Thursdays.    Juliette Alcide, PharmD, BCPS.   Pager: [phone] 04/10/2013 7:08 AM

## 2013-04-10 NOTE — Progress Notes (Signed)
Patient ID: [name], male   DOB: [birth year], 57 y.o.   MRN: [id number]    [facility] Progress Note    Subjective:   Reports to be feeling fair and denies any acute issues. Refused octreotide initiation.    Objective:   BP 144/91  Pulse 75  Temp(Src) 98.3 F (36.8 C) (Oral)  Resp 21  Ht 6\' 4"  (1.93 m)  Wt 91.6 kg (201 lb 15.1 oz)  BMI 24.59 kg/m2  SpO2 100%  Intake/Output Summary (Last 24 hours) at 04/10/13 1311 Last data filed at 04/10/13 1200  Gross per 24 hour  Intake  40981 ml  Output  11600 ml  Net   2656 ml   Weight change:   Physical Exam: XBJ:[id number] resting in bed QMV:HQION RRR, normal S1 and S2  Resp:Coarse BS bilaterally, no rales/rhonchi GEX:BMWU, flat, mild LLQ tenderness Ext:No LE edema  Imaging: No results found.  Labs: BMET  Recent Labs Lab 04/04/13 0441  04/06/13 0400 04/06/13 1600 04/07/13 0500 04/07/13 1830 04/08/13 0500 04/09/13 0345 04/09/13 1157 04/09/13 2100 04/10/13 0430  NA 146*  < > 143 143 144  --  144 132* 133* 139 143  K 2.6*  < > 2.7* 2.8* 2.8* 3.0* 3.6 3.5 4.4 4.4 4.1  CL 89*  < > 83* 86* 102  --  111 100 104 112 116*  CO2 >45*  < > >45* >45* 38*  --  29 25 23 20 21   GLUCOSE 170*  < > 109* 113* 104*  --  80 104* 121* 120* 130*  BUN 61*  < > 77* 72* 55*  --  38* 34* 33* 31* 30*  CREATININE 1.23  < > 2.16* 2.16* 1.79*  --  1.60* 1.52* 1.50* 1.41* 1.34  CALCIUM 9.8  < > 9.0 8.9 7.2*  --  7.4* 8.4 8.3* 8.3* 8.4  PHOS 5.5*  --  6.3*  --  3.0  --  2.3 3.2  --   --  4.9*  < > = values in this interval not displayed. CBC  Recent Labs Lab 04/04/13 0441 04/06/13 0400 04/10/13 0430  WBC 3.2* 3.6* 2.3*  NEUTROABS 1.9 2.2  --   HGB 10.0* 8.8* 6.2*  HCT 30.9* 27.0* 18.6*  MCV 98.7 99.3 96.4  PLT 316 251 176    Medications:    . antiseptic oral rinse  15 mL Mouth Rinse q12n4p  . chlorhexidine  15 mL Mouth Rinse BID  . clindamycin  1 application Topical BID  . enoxaparin (LOVENOX) injection   40 mg Subcutaneous Q24H  . fludrocortisone  0.1 mg Oral BID  . insulin aspart  0-15 Units Subcutaneous Q4H  . midodrine  10 mg Oral TID  . octreotide  100 mcg Subcutaneous Q8H  . pantoprazole  40 mg Oral q morning - 10a  . pregabalin  75 mg Oral TID  . sodium chloride  3 mL Intravenous Q12H  . vitamin C  500 mg Oral q morning - 10a  . zinc sulfate  220 mg Oral q morning - 10a     Assessment/ Plan:   1. AKI: Secondary to GI losses and volume depletion--- significantly polyuric with what appears to be declining GI losses. We'll decrease the rate of intravenous fluids as I suspect that the load of intravenous fluids is partly culpable for the polyuria. We'll place him on 200 cc per hour of normal saline which amounts to the losses that he had from his GI tract yesterday. 2. Hypokalemia/metabolic  alkalosis: due to volume depletion and Gastric losses- improved s/p IVF resuscitation/ potassium repletion  3. Hyponatremia: Iatrogenic from hypotonic fluids and I earlier switched him to isotonic fluid- saline to match output 1cc to 1cc.  4. Abdominal Carcinomatosis/appendiceal carcinoma: to resume outpatient followup/chemotherapy upon discharge   Zetta Bills, MD 04/10/2013, 1:11 PM

## 2013-04-10 NOTE — Progress Notes (Signed)
PT Cancellation Note  _X_Treatment cancelled today due to medical issues with patient which prohibited therapy.Marland KitchenMarland KitchenMarland KitchenMarland Kitchenhemoglobin 6.2 (well below 8.0 protocol)  ___ Treatment cancelled today due to patient receiving procedure or test   ___ Treatment cancelled today due to patient's refusal to participate   ___ Treatment cancelled today due to  Felecia Shelling  PTA [facility]  Acute  Rehab Pager      [phone]

## 2013-04-10 NOTE — Progress Notes (Signed)
Pt stated he felt pressure in his rectum and passed a small brown and clear liquid BM on bedpan. Bed pads were also wet from leaking of mucous from rectum.  Continuous suction to G tube has now been off for at least 4 consecutive hours with tube remaining unclamped; however he has still had approximately 150cc output from it.  Patient has been  able to tolerate no suction longer this evening than in days and nights prior; nausea and/or emesis developed within one hour of tube being clamped.  At this time, pt states he has no nausea, his stomach "feels fine", and he has had no emesis this evening. Will continue to keep G tube suction off and monitor output and pt status closely.

## 2013-04-10 NOTE — Progress Notes (Signed)
PT was cancelled today due to pt hgb levels of 6.2. Felecia Shelling  PTA [facility]  Acute  Rehab Pager      [phone]

## 2013-04-10 NOTE — Progress Notes (Signed)
NUTRITION FOLLOW UP  Intervention:   TPN per PharmD; pt currently receiving Clinimix E5/20 at a goal rate of 100 ml/hr + IVFE 20% at 10 ml/hr on MWF to provide: 120g/day protein, 2318 Kcal/day avg.(2112 Kcal/day STTHS, 2592 Kcal/day MWF). TPN meeting 100% of re-estimated kcal needs and 100% of re-estimated protein needs. Recommend advancing diet to full liquids  Nutrition Dx:   Inadequate oral intake related to inability to eat as evidenced by pt with chronic TPN and chronic PEG for suction; ongoing  Goal:   Pt to meet >/= 90% of their estimated nutrition needs; being met  Monitor:   TPN initiation and rate; at goal rate as of 4/23 Wt; 31 lb wt increase from 4/18 to 4/24- question accuracy of wt's Labs; high BUN, low GFR, high phosphorus, low hemoglobin  Assessment:   TPN is at goal rate of 100 ml/hr as of 4/23 providing 2318 kcal and 120 grams of protein. Pt reports that he has been drinking clear liquids without G-tube for suction and has been tolerated well except for some nausea. Pt requests to have full liquids too. Pt reports doing well and has no questions or concerns at this time.   Height: Ht Readings from Last 1 Encounters:  04/03/13 6\' 4"  (1.93 m)    Weight Status:   Wt Readings from Last 1 Encounters:  04/09/13 201 lb 15.1 oz (91.6 kg)    Re-estimated needs:  Kcal: 2290-2750 Protein: 120-156 grams   Fluid: 2.3-2.6 L   Skin: +2 perineal edema, non-pitting RLE and LLE edema; abrasions on buttocks, abdominal surgical wound  Diet Order: Clear Liquid   Intake/Output Summary (Last 24 hours) at 04/10/13 1056 Last data filed at 04/10/13 1015  Gross per 24 hour  Intake  16109 ml  Output  12475 ml  Net   2821 ml    Last BM: 4/24   Labs:   Recent Labs Lab 04/08/13 0500 04/09/13 0345 04/09/13 1157 04/09/13 2100 04/10/13 0430  NA 144 132* 133* 139 143  K 3.6 3.5 4.4 4.4 4.1  CL 111 100 104 112 116*  CO2 29 25 23 20 21   BUN 38* 34* 33* 31* 30*   CREATININE 1.60* 1.52* 1.50* 1.41* 1.34  CALCIUM 7.4* 8.4 8.3* 8.3* 8.4  MG 1.2* 1.3*  --   --  1.5  PHOS 2.3 3.2  --   --  4.9*  GLUCOSE 80 104* 121* 120* 130*    CBG (last 3)   Recent Labs  04/10/13 0027 04/10/13 0403 04/10/13 0823  GLUCAP 125* 116* 119*    Scheduled Meds: . antiseptic oral rinse  15 mL Mouth Rinse q12n4p  . chlorhexidine  15 mL Mouth Rinse BID  . clindamycin  1 application Topical BID  . enoxaparin (LOVENOX) injection  40 mg Subcutaneous Q24H  . fludrocortisone  0.1 mg Oral BID  . insulin aspart  0-15 Units Subcutaneous Q4H  . midodrine  10 mg Oral TID  . octreotide  100 mcg Subcutaneous Q8H  . pantoprazole  40 mg Oral q morning - 10a  . pregabalin  75 mg Oral TID  . sodium chloride  3 mL Intravenous Q12H  . vitamin C  500 mg Oral q morning - 10a  . zinc sulfate  220 mg Oral q morning - 10a    Continuous Infusions: . Marland KitchenTPN (CLINIMIX-E) Adult 100 mL/hr at 04/09/13 1704  . 0.9 % NaCl with KCl 20 mEq / L 480 mL/hr at 04/10/13 0755  . Marland KitchenTPN (  CLINIMIX-E) Adult     And  . fat emulsion      Ian Malkin RD, LDN Inpatient Clinical Dietitian Pager: [phone] After Hours Pager: [phone]

## 2013-04-11 LAB — BASIC METABOLIC PANEL
BUN: 30 mg/dL — ABNORMAL HIGH (ref 6–23)
BUN: 31 mg/dL — ABNORMAL HIGH (ref 6–23)
CO2: 23 mEq/L (ref 19–32)
Calcium: 9.1 mg/dL (ref 8.4–10.5)
Chloride: 115 mEq/L — ABNORMAL HIGH (ref 96–112)
Creatinine, Ser: 1.29 mg/dL (ref 0.50–1.35)
Creatinine, Ser: 1.22 mg/dL (ref 0.50–1.35)
GFR calc Af Amer: 70 mL/min — ABNORMAL LOW (ref >90)
GFR calc non Af Amer: 60 mL/min — ABNORMAL LOW (ref >90)
Glucose, Bld: 127 mg/dL — ABNORMAL HIGH (ref 70–99)
Potassium: 3.5 mEq/L (ref 3.5–5.1)

## 2013-04-11 LAB — TYPE AND SCREEN: ABO/RH(D): B POS

## 2013-04-11 LAB — GLUCOSE, CAPILLARY

## 2013-04-11 LAB — URINALYSIS, ROUTINE W REFLEX MICROSCOPIC
Bilirubin Urine: NEGATIVE
Glucose, UA: NEGATIVE mg/dL
Hgb urine dipstick: NEGATIVE
Ketones, ur: NEGATIVE mg/dL
Nitrite: NEGATIVE
Specific Gravity, Urine: 1.012 (ref 1.005–1.030)
pH: 6 (ref 5.0–8.0)

## 2013-04-11 LAB — CBC
MCH: 32.1 pg (ref 26.0–34.0)
MCHC: 34.4 g/dL (ref 30.0–36.0)
MCV: 93.3 fL (ref 78.0–100.0)
Platelets: 208 10*3/uL (ref 150–400)
RBC: 2.4 MIL/uL — ABNORMAL LOW (ref 4.22–5.81)
RDW: 20.2 % — ABNORMAL HIGH (ref 11.5–15.5)

## 2013-04-11 LAB — URINE MICROSCOPIC-ADD ON

## 2013-04-11 LAB — MAGNESIUM: Magnesium: 1.6 mg/dL (ref 1.5–2.5)

## 2013-04-11 MED ORDER — TAMSULOSIN HCL 0.4 MG PO CAPS
0.4000 mg | ORAL_CAPSULE | Freq: Every day | ORAL | Status: DC
  Administered 2013-04-11: 0.4 mg via ORAL
  Filled 2013-04-11 (×2): qty 1

## 2013-04-11 MED ORDER — CLINIMIX/DEXTROSE (5/15) 5 % IV SOLN
INTRAVENOUS | Status: AC
  Administered 2013-04-11: 18:00:00 via INTRAVENOUS
  Filled 2013-04-11: qty 2400

## 2013-04-11 MED ORDER — MAGNESIUM SULFATE 40 MG/ML IJ SOLN
4.0000 g | Freq: Once | INTRAMUSCULAR | Status: AC
  Administered 2013-04-11: 4 g via INTRAVENOUS
  Filled 2013-04-11: qty 100

## 2013-04-11 MED ORDER — POTASSIUM CHLORIDE IN NACL 20-0.45 MEQ/L-% IV SOLN
INTRAVENOUS | Status: AC
  Administered 2013-04-11: 15:00:00 via INTRAVENOUS
  Filled 2013-04-11 (×4): qty 1000

## 2013-04-11 MED ORDER — SODIUM CHLORIDE 0.45 % IV SOLN
INTRAVENOUS | Status: AC
  Administered 2013-04-11 – 2013-04-12 (×5): via INTRAVENOUS
  Filled 2013-04-11 (×11): qty 1000

## 2013-04-11 NOTE — Progress Notes (Signed)
[facility] PROGRESS NOTE  [name] LOV:[id number] DOB: April 03, 1956 DOA: 04/03/2013 PCP: [name], MD  Brief HPI:   57 year old gentleman with h/o appendiceal carcinoma, abdominal carcinomatosis, with multiple abdominal surgeries, has a G tube in place attached to drain as needed, an ileostomy and a enterocutaneous fistula, was recently discharged from [facility] medical center after an extended stay of 2 months. During his stay he received chemo every other week and was discharged to [facility] health for rehabilitation. He was brought to [facility] ED after less than a day stay at [facility] for nausea, vomiting and dehydration. On arrival to ED he was found to be dehydrated, hypotensive, hypokalemic and in acute renal failure. Over this hospitalization we have been giving him IV fluids in the form of NS, Dextrose 5 fluids to keep his BP and repleting his electrolytes.    Assessment/Plan:  1. Hypotension from dehydration secondary to nausea and vomiting - Nausea and vomiting resolved.    2. Acute renal failure/ metabolic alkalosis  -  Improved, secondary to ongoing GI loses. Nephrology following, appreciate continuous input.  - difficult to manage his fluid status - have tried to use Octreotide to decrease gi losses, patient refused.   Over the past 24 hours: IN (ml): 10145 O (ml): 12,825  Current drips: TPN 100 cc/h; 1/2 NS with 20 KCl 250 cc/h continuous.   Lab Results  Component Value Date   NA 148* 04/11/2013   NA 143 04/10/2013   NA 139 04/09/2013   NA 133* 04/09/2013   NA 132* 04/09/2013   NA 144 04/08/2013   NA 144 04/07/2013   NA 143 04/06/2013   NA 143 04/06/2013   NA 146* 04/05/2013   Lab Results  Component Value Date   K 3.5 04/11/2013   K 4.1 04/10/2013   K 4.4 04/09/2013   K 4.4 04/09/2013   K 3.5 04/09/2013   K 3.6 04/08/2013   K 3.0* 04/07/2013   K 2.8* 04/07/2013   K 2.8* 04/06/2013   K 2.7* 04/06/2013    3. Abdominal  carcinomatosis - Treatment per his oncologist. Patient presently follows at [facility] wit Dr Leavy Cella. Not a candidate for chemotherapy per our assessment but patient insists that his oncologist (Dr. Leavy Cella) plans to continue chemotherapy and he wishes to be stabilized as soon as possible so that he can resume his chemo.  - patient asked today if there is anyone else from oncology in the area that is willing to offer him chemo.  4. History of gout - presently not having any flares. 5. Neuropathic pain - persistent on Lyrica 50 TID, will increase to 75 TID. 6. Hypokalemia: replete as needed. 7. Hyper/hyponatremia - management as above. 8. Nutrition on TPN and clear liquid diet. G tube drain per shift. 9. DVT prophylaxis.   HPI/Subjective: - continues to feel well  Objective: Filed Vitals:   04/11/13 0400 04/11/13 0440 04/11/13 0500 04/11/13 0800  BP:  124/81    Pulse:      Temp: 97.7 F (36.5 C)   97.4 F (36.3 C)  TempSrc: Oral   Axillary  Resp:  20  25  Height:      Weight:   85.1 kg (187 lb 9.8 oz)   SpO2:  100%  100%    Intake/Output Summary (Last 24 hours) at 04/11/13 1009 Last data filed at 04/11/13 0900  Gross per 24 hour  Intake 9543.33 ml  Output  88416 ml  Net -2656.67 ml   Filed Weights   04/08/13 0400 04/09/13 0330 04/11/13 0500  Weight: 93.7 kg (206 lb 9.1 oz) 91.6 kg (201 lb 15.1 oz) 85.1 kg (187 lb 9.8 oz)   Exam:  General: Well-developed and poorly nourished.  Cardiovascular: S1-S2 heard tachycardic.  Respiratory: No rhonchi or crepitations.  Abdomen: colostomy pouch and suction tube in place.  Skin: No new rash.  Musculoskeletal: No edema.   Neurologic: Moves all extremities.  Data Reviewed: Basic Metabolic Panel:  Recent Labs Lab 04/07/13 0500  04/08/13 0500 04/09/13 0345 04/09/13 1157 04/09/13 2100 04/10/13 0430 04/11/13 0437  NA 144  --  144 132* 133* 139 143 148*  K 2.8*  < > 3.6 3.5 4.4 4.4 4.1 3.5  CL 102  --  111 100 104 112  116* 115*  CO2 38*  --  29 25 23 20 21 23   GLUCOSE 104*  --  80 104* 121* 120* 130* 151*  BUN 55*  --  38* 34* 33* 31* 30* 30*  CREATININE 1.79*  --  1.60* 1.52* 1.50* 1.41* 1.34 1.29  CALCIUM 7.2*  --  7.4* 8.4 8.3* 8.3* 8.4 9.0  MG 1.3*  --  1.2* 1.3*  --   --  1.5 1.6  PHOS 3.0  --  2.3 3.2  --   --  4.9* 6.5*  < > = values in this interval not displayed. Liver Function Tests:  Recent Labs Lab 04/05/13 0750 04/06/13 0400 04/09/13 0345  AST 66* 49* 28  ALT 71* 56* 25  ALKPHOS 380* 289* 192*  BILITOT 0.8 0.8 0.5  PROT 7.2 5.8* 4.5*  ALBUMIN 2.6* 2.1* 1.5*   CBC:  Recent Labs Lab 04/06/13 0400 04/10/13 0430 04/10/13 1415 04/11/13 0437  WBC 3.6* 2.3*  --  3.3*  NEUTROABS 2.2  --   --   --   HGB 8.8* 6.2* 7.5* 7.7*  HCT 27.0* 18.6* 22.0* 22.4*  MCV 99.3 96.4  --  93.3  PLT 251 176  --  208   BNP (last 3 results)  Recent Labs  12/22/12 1700  PROBNP 33.8   CBG:  Recent Labs Lab 04/10/13 1941 04/10/13 2303 04/11/13 0032 04/11/13 0435 04/11/13 0801  GLUCAP 113* 138* 98 151* 115*    Recent Results (from the past 240 hour(s))  CULTURE, BLOOD (ROUTINE X 2)     Status: None   Collection Time    04/03/13  3:00 AM      Result Value Range Status   Specimen Description BLOOD LEFT HAND   Final   Special Requests BOTTLES DRAWN AEROBIC AND ANAEROBIC 5CC   Final   Culture  Setup Time 04/03/2013 08:32   Final   Culture NO GROWTH 5 DAYS   Final   Report Status 04/09/2013 FINAL   Final  CULTURE, BLOOD (ROUTINE X 2)     Status: None   Collection Time    04/03/13  3:05 AM      Result Value Range Status   Specimen Description BLOOD RIGHT HAND   Final   Special Requests BOTTLES DRAWN AEROBIC AND ANAEROBIC 5CC   Final   Culture  Setup Time 04/03/2013 08:32   Final   Culture NO GROWTH 5 DAYS   Final   Report Status 04/09/2013 FINAL   Final  URINE CULTURE     Status: None   Collection Time    04/03/13  3:45 AM      Result Value Range Status   Specimen  Description  URINE, CLEAN CATCH   Final   Special Requests NONE   Final   Culture  Setup Time 04/03/2013 08:39   Final   Colony Count NO GROWTH   Final   Culture NO GROWTH   Final   Report Status 04/04/2013 FINAL   Final  MRSA PCR SCREENING     Status: Abnormal   Collection Time    04/03/13  5:00 AM      Result Value Range Status   MRSA by PCR POSITIVE (*) NEGATIVE Final   Comment:            The GeneXpert MRSA Assay (FDA     approved for NASAL specimens     only), is one component of a     comprehensive MRSA colonization     surveillance program. It is not     intended to diagnose MRSA     infection nor to guide or     monitor treatment for     MRSA infections.     RESULT CALLED TO, READ BACK BY AND VERIFIED WITH:     C.CAGLE AT 0743 ON [id number] BY C.BONGEL   Studies: No results found.  Scheduled Meds: . antiseptic oral rinse  15 mL Mouth Rinse q12n4p  . chlorhexidine  15 mL Mouth Rinse BID  . clindamycin  1 application Topical BID  . enoxaparin (LOVENOX) injection  40 mg Subcutaneous Q24H  . fludrocortisone  0.1 mg Oral BID  . insulin aspart  0-15 Units Subcutaneous Q4H  . magnesium sulfate 1 - 4 g bolus IVPB  4 g Intravenous Once  . midodrine  10 mg Oral TID  . octreotide  100 mcg Subcutaneous Q8H  . pantoprazole  40 mg Oral q morning - 10a  . pregabalin  75 mg Oral TID  . sodium chloride  3 mL Intravenous Q12H  . vitamin C  500 mg Oral q morning - 10a  . zinc sulfate  220 mg Oral q morning - 10a   Continuous Infusions: . 0.9 % NaCl with KCl 20 mEq / L 200 mL/hr at 04/11/13 0619  . Marland KitchenTPN (CLINIMIX-E) Adult 100 mL/hr at 04/10/13 1702   And  . fat emulsion 250 mL (04/10/13 1702)   Principal Problem:   Dehydration secondary to nausea and vomiting Active Problems:   Abdominal malignant neoplasm   Hypokalemia   Acute kidney injury secondary to dehydration  Total time: 25 min  Pamella Pert  [facility] Pager [phone]. If 7PM-7AM, please contact night-coverage at  [email or web address], password [facility] 04/11/2013, 10:09 AM  LOS: 8 days

## 2013-04-11 NOTE — Progress Notes (Addendum)
PARENTERAL NUTRITION CONSULT NOTE - FOLLOW UP  Pharmacy Consult for TPN Indication: enterocutaneous fistula, abdominal carcinomatosis  Allergies  Allergen Reactions  . Percocet (Oxycodone-Acetaminophen) Nausea And Vomiting    hallucination   Patient Measurements: Height: 6\' 4"  (193 cm) Weight: 187 lb 9.8 oz (85.1 kg) IBW/kg (Calculated) : 86.8 Usual Weight: 170-180 lbs  Vital Signs: Temp: 97.7 F (36.5 C) (04/26 0400) Temp src: Oral (04/26 0400) BP: 124/81 mmHg (04/26 0440) Intake/Output from previous day: 04/25 0701 - 04/26 0700 In: 10145.8 [P.O.:1920; I.V.:5413.3; Blood:342.5; IV Piggyback:50; TPN:2420] Out: 16109 [Urine:6650; Drains:6100; Stool:75] Intake/Output from this shift:    Labs:  Recent Labs  04/10/13 0430 04/10/13 1415 04/11/13 0437  WBC 2.3*  --  3.3*  HGB 6.2* 7.5* 7.7*  HCT 18.6* 22.0* 22.4*  PLT 176  --  208    Recent Labs  04/09/13 0345  04/09/13 2100 04/10/13 0430 04/11/13 0437  NA 132*  < > 139 143 148*  K 3.5  < > 4.4 4.1 3.5  CL 100  < > 112 116* 115*  CO2 25  < > 20 21 23   GLUCOSE 104*  < > 120* 130* 151*  BUN 34*  < > 31* 30* 30*  CREATININE 1.52*  < > 1.41* 1.34 1.29  CALCIUM 8.4  < > 8.3* 8.4 9.0  MG 1.3*  --   --  1.5 1.6  PHOS 3.2  --   --  4.9* 6.5*  PROT 4.5*  --   --   --   --   ALBUMIN 1.5*  --   --   --   --   AST 28  --   --   --   --   ALT 25  --   --   --   --   ALKPHOS 192*  --   --   --   --   BILITOT 0.5  --   --   --   --   < > = values in this interval not displayed. Estimated Creatinine Clearance: 77 ml/min (by C-G formula based on Cr of 1.29).    Recent Labs  04/10/13 2303 04/11/13 0032 04/11/13 0435  GLUCAP 138* 98 151*    Insulin Requirements in the past 24 hours:  9units SSI required/24h, hypoglycemia resolved Regular insulin removed from TPN 4/23  IVF per Nephrology: NS with 20KCl at 235ml.hr  Current Nutrition:  Clinimix 5/20 at 100 ml/hr + lipids at 10 ml/hr MWF.  Nutritional Goals:   RD recommendations (4/18): Kcal: 2220-2565 Protein: 100-132 grams Fluid: 2.3-2.6 L   Can achieve these nutrition goals with Clinimix E5/20 at a goal rate of 100 ml/hr + IVFE 20% at 10 ml/hr on MWF to provide: 120g/day protein, 2318 Kcal/day avg.(2112 Kcal/day STTHS, 2592 Kcal/day MWF).    Assessment:  89 yoM with complicated PMH of metastatic adenocarcinoma, peritoneal carcinomatosis s/p extensive debulking procedures and abdominal surgeries which has left him with multiple drains and fistulas on chronic TPN via a PICC line. Discharged from [facility] recently and was transferred to his nursing home in [place] started developing N/V and weakness. Was receiving TPN cycled over 18 hours at [facility]. Will continue TPN inpatient but will be difficult to provide large protein and kcal amounts with premixed Clinimix TNAs.  RD evaluated the patient 4/18 and recommended a reduced protein goal than PTA.  Pharmacy will be able to meet new nutrition goals with the premixed Clinimix product.  Will provide TNA continuously inpatient due  to high volume that needs to be administered with premixed product.  Labs:  CBGs: at goal < 150mg /dl but trending back p  Electrolytes: K+ = 3.5 following rate reduction of MIVF,  Na trending up - 148, Phos elevated = 6.5, Corr Ca = 11 (Phos x Ca = 71).  Mg remains low end normal despite replacement last several days  LFTs: AST/ALT WNL, Alk Phos remains elevated (4/21)  TGs: 195 (4/19), 177 (4/21)  Prealbumin: 33 (4/19), 28.2 (4/21)  Renal: ARF from hypotension/volume depletion; SCr now improved to WNL. I/O=10.7L/12.8L (-2.1L/24h), UOP=6.65L, drains= 6.1L. Renal following (contraction alkalosis resolved from admission, d/t severe volume depletion). Octreotide started 4/24 in an attempt to reduce drainage output but patient refusing  Plan:  At 1800 today:  Due to electrolyte abnormalities (increased phos and Ca X phos product), will need to remove electrolytes  from TPN.  This will require changing to new formulation of Clinimix 5/15, will continue rate of 129ml/hr but will only provide 1704 Kcal vs 2112 kcals with 5/20 product Suspect lytes to trend down with removal of ALL electrolytes from TPN Likely need to change adjust KCl in MIVF from 51meq/L to 66meq/L to make up difference in K+ from no lytes in TPN (spoke with renal and OK to inc KCl in MIVF d/t no lytes in TPN starting at 6pm tonight) Magnesium remains low end normal, order mag sulfate 4gm x1 Continue no insulin in TPN - may need to add back since TNA rate increased 4/23 pm (watch CBGs). Cont SSI. Suspect CBGs to trend down now that had to change to lower dextrose concentration Clinimix formulation Fat emulsion at 10 ml/hr (MWF only due to ongoing shortage).  TNA to contain standard multivitamins and trace elements (MWF only due to ongoing shortage). Will continue to use IV as unsure of enteral absorption given massive GI losses. Also vitamin C and zinc continued from home..  TNA lab panels on Mondays & Thursdays.    Juliette Alcide, PharmD, BCPS.   Pager: [phone] 04/11/2013 7:19 AM

## 2013-04-11 NOTE — Progress Notes (Addendum)
Patient ID: [name], male   DOB: [birth year], 57 y.o.   MRN: [id number]   [facility] Progress Note   Subjective:   Reports to be feeling fair- Complains of some hesitancy, poor stream and sensation of incomplete voiding   Objective:   BP 125/92  Pulse 78  Temp(Src) 97.4 F (36.3 C) (Axillary)  Resp 24  Ht 6\' 4"  (1.93 m)  Wt 85.1 kg (187 lb 9.8 oz)  BMI 22.85 kg/m2  SpO2 100%  Intake/Output Summary (Last 24 hours) at 04/11/13 1124 Last data filed at 04/11/13 1042  Gross per 24 hour  Intake 9183.33 ml  Output  40981 ml  Net -3466.67 ml   Weight change:   Physical Exam: XBJ:[id number] sitting up in bed- watching TV QMV:HQION RRR, normal S1 and S2  Resp:CTA- no rales/rhonchi GEX:BMWU, slightly distended, BS normal Ext:No LE edema  Imaging: No results found.  Labs: BMET  Recent Labs Lab 04/06/13 0400  04/07/13 0500 04/07/13 1830 04/08/13 0500 04/09/13 0345 04/09/13 1157 04/09/13 2100 04/10/13 0430 04/11/13 0437  NA 143  < > 144  --  144 132* 133* 139 143 148*  K 2.7*  < > 2.8* 3.0* 3.6 3.5 4.4 4.4 4.1 3.5  CL 83*  < > 102  --  111 100 104 112 116* 115*  CO2 >45*  < > 38*  --  29 25 23 20 21 23   GLUCOSE 109*  < > 104*  --  80 104* 121* 120* 130* 151*  BUN 77*  < > 55*  --  38* 34* 33* 31* 30* 30*  CREATININE 2.16*  < > 1.79*  --  1.60* 1.52* 1.50* 1.41* 1.34 1.29  CALCIUM 9.0  < > 7.2*  --  7.4* 8.4 8.3* 8.3* 8.4 9.0  PHOS 6.3*  --  3.0  --  2.3 3.2  --   --  4.9* 6.5*  < > = values in this interval not displayed. CBC  Recent Labs Lab 04/06/13 0400 04/10/13 0430 04/10/13 1415 04/11/13 0437  WBC 3.6* 2.3*  --  3.3*  NEUTROABS 2.2  --   --   --   HGB 8.8* 6.2* 7.5* 7.7*  HCT 27.0* 18.6* 22.0* 22.4*  MCV 99.3 96.4  --  93.3  PLT 251 176  --  208    Medications:    . antiseptic oral rinse  15 mL Mouth Rinse q12n4p  . chlorhexidine  15 mL Mouth Rinse BID  . clindamycin  1 application Topical BID  . enoxaparin (LOVENOX)  injection  40 mg Subcutaneous Q24H  . fludrocortisone  0.1 mg Oral BID  . insulin aspart  0-15 Units Subcutaneous Q4H  . midodrine  10 mg Oral TID  . octreotide  100 mcg Subcutaneous Q8H  . pantoprazole  40 mg Oral q morning - 10a  . pregabalin  75 mg Oral TID  . sodium chloride  3 mL Intravenous Q12H  . vitamin C  500 mg Oral q morning - 10a  . zinc sulfate  220 mg Oral q morning - 10a   Assessment/ Plan:   1. AKI: Secondary to GI losses and volume depletion--- significantly polyuric with large GI losses. Continue IVFs as he appears unable to keep up with his deficits 2. Hypokalemia/metabolic alkalosis: due to volume depletion and Gastric losses- improved s/p IVF resuscitation/ potassium repletion ---continue IV K in fluid 3. Hypernatremia: Switch IVF to 1/2NS at 212mL/hr- encouraged water ingestion  4. Abdominal Carcinomatosis/appendiceal carcinoma: to  resume outpatient followup/chemotherapy upon discharge 5.Hesitancy: Bladder US and consider flomax   Zetta Bills, MD 04/11/2013, 11:24 AM

## 2013-04-11 NOTE — Progress Notes (Signed)
Pt complains of difficulty starting stream of urine and does not feel as though he has fully emptied his bladder.  Bladder scan performed after voiding, noted 308 to residual scan result.  Ok Edwards, RN

## 2013-04-12 DIAGNOSIS — N390 Urinary tract infection, site not specified: Secondary | ICD-10-CM

## 2013-04-12 LAB — BASIC METABOLIC PANEL
BUN: 36 mg/dL — ABNORMAL HIGH (ref 6–23)
CO2: 21 mEq/L (ref 19–32)
CO2: 20 mEq/L (ref 19–32)
Calcium: 8.6 mg/dL (ref 8.4–10.5)
Chloride: 109 mEq/L (ref 96–112)
Creatinine, Ser: 1.26 mg/dL (ref 0.50–1.35)
Glucose, Bld: 112 mg/dL — ABNORMAL HIGH (ref 70–99)
Glucose, Bld: 91 mg/dL (ref 70–99)
Potassium: 4.7 mEq/L (ref 3.5–5.1)

## 2013-04-12 LAB — GLUCOSE, CAPILLARY
Glucose-Capillary: 113 mg/dL — ABNORMAL HIGH (ref 70–99)
Glucose-Capillary: 106 mg/dL — ABNORMAL HIGH (ref 70–99)

## 2013-04-12 LAB — MAGNESIUM: Magnesium: 1.8 mg/dL (ref 1.5–2.5)

## 2013-04-12 LAB — CBC
HCT: 21.7 % — ABNORMAL LOW (ref 39.0–52.0)
HCT: 23.2 % — ABNORMAL LOW (ref 39.0–52.0)
Hemoglobin: 7.5 g/dL — ABNORMAL LOW (ref 13.0–17.0)
Hemoglobin: 8.1 g/dL — ABNORMAL LOW (ref 13.0–17.0)
MCH: 32.3 pg (ref 26.0–34.0)
MCHC: 34.6 g/dL (ref 30.0–36.0)
MCHC: 34.9 g/dL (ref 30.0–36.0)
RBC: 2.48 MIL/uL — ABNORMAL LOW (ref 4.22–5.81)

## 2013-04-12 MED ORDER — PIPERACILLIN-TAZOBACTAM 3.375 G IVPB
3.3750 g | Freq: Three times a day (TID) | INTRAVENOUS | Status: DC
  Administered 2013-04-12 – 2013-04-13 (×3): 3.375 g via INTRAVENOUS
  Filled 2013-04-12 (×4): qty 50

## 2013-04-12 MED ORDER — POTASSIUM CHLORIDE IN NACL 20-0.45 MEQ/L-% IV SOLN
INTRAVENOUS | Status: DC
  Administered 2013-04-12: 18:00:00 via INTRAVENOUS
  Administered 2013-04-13 (×2): 300 mL via INTRAVENOUS
  Administered 2013-04-13 (×3): via INTRAVENOUS
  Administered 2013-04-14: 300 mL via INTRAVENOUS
  Administered 2013-04-14: 14:00:00 via INTRAVENOUS
  Administered 2013-04-14: 300 mL via INTRAVENOUS
  Administered 2013-04-14 (×2): via INTRAVENOUS
  Administered 2013-04-15: 1000 mL via INTRAVENOUS
  Administered 2013-04-15: 400 mL via INTRAVENOUS
  Administered 2013-04-15: 400 mL/h via INTRAVENOUS
  Administered 2013-04-15: 1000 mL via INTRAVENOUS
  Administered 2013-04-16 (×2): via INTRAVENOUS
  Administered 2013-04-16: 1000 mL via INTRAVENOUS
  Filled 2013-04-12 (×39): qty 1000

## 2013-04-12 MED ORDER — CLINIMIX E/DEXTROSE (5/20) 5 % IV SOLN
INTRAVENOUS | Status: AC
  Administered 2013-04-12: 18:00:00 via INTRAVENOUS
  Filled 2013-04-12: qty 2400

## 2013-04-12 MED ORDER — TAMSULOSIN HCL 0.4 MG PO CAPS
0.4000 mg | ORAL_CAPSULE | Freq: Every day | ORAL | Status: DC
  Administered 2013-04-12 – 2013-04-17 (×6): 0.4 mg via ORAL
  Filled 2013-04-12 (×6): qty 1

## 2013-04-12 MED ORDER — TAMSULOSIN HCL 0.4 MG PO CAPS
0.8000 mg | ORAL_CAPSULE | Freq: Every day | ORAL | Status: DC
  Filled 2013-04-12: qty 2

## 2013-04-12 NOTE — Progress Notes (Signed)
PARENTERAL NUTRITION CONSULT NOTE - FOLLOW UP  Pharmacy Consult for TPN Indication: enterocutaneous fistula, abdominal carcinomatosis  Allergies  Allergen Reactions  . Percocet (Oxycodone-Acetaminophen) Nausea And Vomiting    hallucination   Patient Measurements: Height: 6\' 4"  (193 cm) Weight: 189 lb 9.5 oz (86 kg) IBW/kg (Calculated) : 86.8 Usual Weight: 170-180 lbs  Vital Signs: Temp: 97.4 F (36.3 C) (04/27 0400) Temp src: Oral (04/27 0400) BP: 108/72 mmHg (04/27 0415) Intake/Output from previous day: 04/26 0701 - 04/27 0700 In: 8410 [P.O.:1920; I.V.:4100; IV Piggyback:100; TPN:2290] Out: 40981 [Urine:2450; Drains:8775; Stool:75] Intake/Output from this shift:    Labs:  Recent Labs  04/10/13 0430 04/10/13 1415 04/11/13 0437 04/12/13 0402  WBC 2.3*  --  3.3* 2.7*  HGB 6.2* 7.5* 7.7* 7.5*  HCT 18.6* 22.0* 22.4* 21.7*  PLT 176  --  208 190    Recent Labs  04/10/13 0430 04/11/13 0437 04/11/13 1655 04/12/13 0402  NA 143 148* 145 140  K 4.1 3.5 3.8 4.1  CL 116* 115* 114* 113*  CO2 21 23 23 21   GLUCOSE 130* 151* 127* 112*  BUN 30* 30* 31* 33*  CREATININE 1.34 1.29 1.22 1.26  CALCIUM 8.4 9.0 9.1 8.6  MG 1.5 1.6  --  1.8  PHOS 4.9* 6.5*  --  4.8*   Estimated Creatinine Clearance: 79.6 ml/min (by C-G formula based on Cr of 1.26).    Recent Labs  04/11/13 1630 04/11/13 2034 04/12/13 0002  GLUCAP 116* 122* 110*    Insulin Requirements in the past 24 hours:  6 units SSI required/24h, hypoglycemia resolved (from 4/23) Regular insulin removed from TPN 4/23  IVF per Nephrology: 1/2NS with 40KCl at 216ml.hr  Current Nutrition:  Clinimix 5/20 at 100 ml/hr + lipids at 10 ml/hr MWF. Full liquids  Nutritional Goals:  RD recommendations (4/18): Kcal: 2220-2565 Protein: 100-132 grams Fluid: 2.3-2.6 L   Can achieve these nutrition goals with Clinimix E5/20 at a goal rate of 100 ml/hr + IVFE 20% at 10 ml/hr on MWF to provide: 120g/day protein, 2318  Kcal/day avg.(2112 Kcal/day STTHS, 2592 Kcal/day MWF).    Assessment:  54 yoM with complicated PMH of metastatic adenocarcinoma, peritoneal carcinomatosis s/p extensive debulking procedures and abdominal surgeries which has left him with multiple drains and fistulas on chronic TPN via a PICC line. Discharged from [facility] recently and was transferred to his nursing home in [place] started developing N/V and weakness. Was receiving TPN cycled over 18 hours at [facility]. Will continue TPN inpatient but will be difficult to provide large protein and kcal amounts with premixed Clinimix TNAs.  RD evaluated the patient 4/18 and recommended a reduced protein goal than PTA.  Pharmacy will be able to meet new nutrition goals with the premixed Clinimix product.  Will provide TNA continuously inpatient due to high volume that needs to be administered with premixed product.  Labs:  CBGs: at goal < 150mg /dl  Electrolytes: K+ = 4.1,  Na better with change to 1/2NS, Phos improved but at ULN, Corr Ca = 10.6 (Phos x Ca = 51).  Mg better this am following 4gm mag sulfate yday.   LFTs: AST/ALT WNL, Alk Phos remains elevated (4/21)  TGs: 195 (4/19), 177 (4/21)  Prealbumin: 33 (4/19), 28.2 (4/21)  Renal: ARF from hypotension/volume depletion; SCr now improved to WNL. I/O=8.4L/11.3L (-2.9L/24h), UOP=2.4L (down by ~4L), drains= 8.8L. Renal following (contraction alkalosis resolved from admission, d/t severe volume depletion). Octreotide started 4/24 in an attempt to reduce drainage output but patient  refusing  Plan:  At 1800 today:  Electrolytes better today, can switch back to electrolyte containing Clinimix product E5/20 at 124ml/hr, premixed TPNs making things more difficult. (may have to alternate days of lyte-containing bag with no lytes to min electolyte abnormalities) Will need to change KCl in MIVF to 54meq/L with lytes being added back tonight in TPN (await renal plan for possible rate  adjustment) Continue no insulin in TPN - may need to add back since TNA rate increased 4/23 pm (watch CBGs). Cont SSI.  Fat emulsion at 10 ml/hr (MWF only due to ongoing shortage).  TNA to contain standard multivitamins and trace elements (MWF only due to ongoing shortage). Will continue to use IV as unsure of enteral absorption given massive GI losses. Also vitamin C and zinc continued from home..  TNA lab panels on Mondays & Thursdays.   Juliette Alcide, PharmD, BCPS.   Pager: [phone] 04/12/2013 7:45 AM

## 2013-04-12 NOTE — Progress Notes (Signed)
Patient ID: [name], male   DOB: 1956/08/24, 57 y.o.   MRN: [id number]   [facility] Progress Note    Subjective:   Reports more hesitancy and difficulty with urinary stream overnight- now on tamsulosin   Objective:   BP 140/91  Pulse 78  Temp(Src) 97.5 F (36.4 C) (Oral)  Resp 20  Ht 6\' 4"  (1.93 m)  Wt 86 kg (189 lb 9.5 oz)  BMI 23.09 kg/m2  SpO2 100%  Intake/Output Summary (Last 24 hours) at 04/12/13 1214 Last data filed at 04/12/13 1100  Gross per 24 hour  Intake   9070 ml  Output   9700 ml  Net   -630 ml   Weight change: 0.9 kg (1 lb 15.8 oz)  Physical Exam: MWU:[id number] resting in bed ZDG:UYQIH RRR, normal S1 and S2  Resp:CTA bilaterally, no rales/rhonchi KVQ:QVZD, slightly distended, BS normal Ext:No LE edema  Imaging: No results found.  Labs: BMET  Recent Labs Lab 04/06/13 0400  04/07/13 0500  04/08/13 0500 04/09/13 0345 04/09/13 1157 04/09/13 2100 04/10/13 0430 04/11/13 0437 04/11/13 1655 04/12/13 0402  NA 143  < > 144  --  144 132* 133* 139 143 148* 145 140  K 2.7*  < > 2.8*  < > 3.6 3.5 4.4 4.4 4.1 3.5 3.8 4.1  CL 83*  < > 102  --  111 100 104 112 116* 115* 114* 113*  CO2 >45*  < > 38*  --  29 25 23 20 21 23 23 21   GLUCOSE 109*  < > 104*  --  80 104* 121* 120* 130* 151* 127* 112*  BUN 77*  < > 55*  --  38* 34* 33* 31* 30* 30* 31* 33*  CREATININE 2.16*  < > 1.79*  --  1.60* 1.52* 1.50* 1.41* 1.34 1.29 1.22 1.26  CALCIUM 9.0  < > 7.2*  --  7.4* 8.4 8.3* 8.3* 8.4 9.0 9.1 8.6  PHOS 6.3*  --  3.0  --  2.3 3.2  --   --  4.9* 6.5*  --  4.8*  < > = values in this interval not displayed. CBC  Recent Labs Lab 04/06/13 0400 04/10/13 0430 04/10/13 1415 04/11/13 0437 04/12/13 0402  WBC 3.6* 2.3*  --  3.3* 2.7*  NEUTROABS 2.2  --   --   --   --   HGB 8.8* 6.2* 7.5* 7.7* 7.5*  HCT 27.0* 18.6* 22.0* 22.4* 21.7*  MCV 99.3 96.4  --  93.3 93.5  PLT 251 176  --  208 190    Medications:    . antiseptic oral rinse  15 mL  Mouth Rinse q12n4p  . chlorhexidine  15 mL Mouth Rinse BID  . clindamycin  1 application Topical BID  . enoxaparin (LOVENOX) injection  40 mg Subcutaneous Q24H  . fludrocortisone  0.1 mg Oral BID  . insulin aspart  0-15 Units Subcutaneous Q4H  . midodrine  10 mg Oral TID  . pantoprazole  40 mg Oral q morning - 10a  . piperacillin-tazobactam (ZOSYN)  IV  3.375 g Intravenous Q8H  . pregabalin  75 mg Oral TID  . sodium chloride  3 mL Intravenous Q12H  . tamsulosin  0.4 mg Oral Daily  . vitamin C  500 mg Oral q morning - 10a  . zinc sulfate  220 mg Oral q morning - 10a     Assessment/ Plan:   1. AKI: Secondary to GI losses and volume depletion---with ongoing large GI  losses and decreased UOP. Continue IVFs as he appears unable to keep up with his deficits  2. Hypokalemia/metabolic alkalosis: due to volume depletion and Gastric losses- improved s/p IVF resuscitation/ potassium repletion ---continue IV K in fluid  3. Hypernatremia: Improved on 1/2NS  4. Abdominal Carcinomatosis/appendiceal carcinoma: to resume outpatient followup/chemotherapy upon discharge  5.Hesitancy: Now on tamsulosin. Large bladder residuals noted.  Will need to be set up for recurrent IVF bolus infusions upon DC through his PCP to avoid volume depletion with weekly or twice a week monitoring of labs to correct deficiencies. He refuses octreotide because of prior AEs and there does not seem to be anything else to offer.  Will sign off for now---please call if further assistance in care is needed  Zetta Bills, MD 04/12/2013, 12:14 PM

## 2013-04-12 NOTE — Progress Notes (Signed)
Patient alert and oriented.  Patient complains of 7/10 pain associated with difficulty with urination and burning with urination.  Pain medications given. Bladder scan performed; residual scanned.  Patient refuses catheterization.  Claiborne Billings, PA of [facility] notified.

## 2013-04-12 NOTE — Progress Notes (Signed)
Patient C/O burning sensation and unable to empty bladder, patient urine output 300cc , PVR > 600, notified Dr. Elvera Lennox, ordered I/O cath, patient refused I/O cath even after educating patient. Will continue to assess and report off to night shift nurse to F/U.

## 2013-04-12 NOTE — Progress Notes (Signed)
Patient transferred from ICU, alert and oriented, denies pain. Ileostomy, Eakin pouch mid abd and J-tube LUQ inplace and intact. J-tube and Eakin pouch connected to suction. Stage II on buttuck covered with allevyn dressing. Oriented patient to room/unit, In bed resting will continue to monitor patient.

## 2013-04-12 NOTE — Progress Notes (Signed)
ANTIBIOTIC CONSULT NOTE - INITIAL  Pharmacy Consult for zosyn Indication: UTI  Allergies  Allergen Reactions  . Percocet (Oxycodone-Acetaminophen) Nausea And Vomiting    hallucination    Patient Measurements: Height: 6\' 4"  (193 cm) Weight: 189 lb 9.5 oz (86 kg) IBW/kg (Calculated) : 86.8 Adjusted Body Weight:   Vital Signs: Temp: 97.5 F (36.4 C) (04/27 0800) Temp src: Oral (04/27 0800) BP: 108/72 mmHg (04/27 0415) Intake/Output from previous day: 04/26 0701 - 04/27 0700 In: 8760 [P.O.:1920; I.V.:4350; IV Piggyback:100; TPN:2390] Out: 13086 [Urine:2450; Drains:8775; Stool:75] Intake/Output from this shift: Total I/O In: 350 [I.V.:250; TPN:100] Out: 300 [Urine:300]  Labs:  Recent Labs  04/10/13 0430 04/10/13 1415 04/11/13 0437 04/11/13 1655 04/12/13 0402  WBC 2.3*  --  3.3*  --  2.7*  HGB 6.2* 7.5* 7.7*  --  7.5*  PLT 176  --  208  --  190  CREATININE 1.34  --  1.29 1.22 1.26   Estimated Creatinine Clearance: 79.6 ml/min (by C-G formula based on Cr of 1.26). No results found for this basename: VANCOTROUGH, VANCOPEAK, VANCORANDOM, GENTTROUGH, GENTPEAK, GENTRANDOM, TOBRATROUGH, TOBRAPEAK, TOBRARND, AMIKACINPEAK, AMIKACINTROU, AMIKACIN,  in the last 72 hours   Microbiology: Recent Results (from the past 720 hour(s))  CULTURE, BLOOD (ROUTINE X 2)     Status: None   Collection Time    04/03/13  3:00 AM      Result Value Range Status   Specimen Description BLOOD LEFT HAND   Final   Special Requests BOTTLES DRAWN AEROBIC AND ANAEROBIC 5CC   Final   Culture  Setup Time 04/03/2013 08:32   Final   Culture NO GROWTH 5 DAYS   Final   Report Status 04/09/2013 FINAL   Final  CULTURE, BLOOD (ROUTINE X 2)     Status: None   Collection Time    04/03/13  3:05 AM      Result Value Range Status   Specimen Description BLOOD RIGHT HAND   Final   Special Requests BOTTLES DRAWN AEROBIC AND ANAEROBIC 5CC   Final   Culture  Setup Time 04/03/2013 08:32   Final   Culture NO  GROWTH 5 DAYS   Final   Report Status 04/09/2013 FINAL   Final  URINE CULTURE     Status: None   Collection Time    04/03/13  3:45 AM      Result Value Range Status   Specimen Description URINE, CLEAN CATCH   Final   Special Requests NONE   Final   Culture  Setup Time 04/03/2013 08:39   Final   Colony Count NO GROWTH   Final   Culture NO GROWTH   Final   Report Status 04/04/2013 FINAL   Final  MRSA PCR SCREENING     Status: Abnormal   Collection Time    04/03/13  5:00 AM      Result Value Range Status   MRSA by PCR POSITIVE (*) NEGATIVE Final   Comment:            The GeneXpert MRSA Assay (FDA     approved for NASAL specimens     only), is one component of a     comprehensive MRSA colonization     surveillance program. It is not     intended to diagnose MRSA     infection nor to guide or     monitor treatment for     MRSA infections.     RESULT CALLED TO, READ BACK BY AND  VERIFIED WITH:     C.CAGLE AT 0743 ON 18APR14 BY C.BONGEL  URINE CULTURE     Status: None   Collection Time    04/11/13  4:55 AM      Result Value Range Status   Specimen Description URINE, CLEAN CATCH   Final   Special Requests NONE   Final   Culture  Setup Time 04/11/2013 11:57   Final   Colony Count >=100,000 COLONIES/ML   Final   Culture GRAM NEGATIVE RODS   Final   Report Status PENDING   Incomplete    Medical History: Past Medical History  Diagnosis Date  . Hypertension   . GERD (gastroesophageal reflux disease)   . Small bowel obstruction 2013  . 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 Acute cholecystitis s/p perc draianage [id number] 12/20/2012  . Anemia of chronic disease 12/19/2012  . 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 MRSA bacteremia 01/04/2013  . SBO (small bowel obstruction) 11/07/2011  . Septic shock due to Staphylococcus aureus 12/24/2012  . Severe protein-calorie malnutrition 01/17/2013     Assessment: 51 YOM admitted 4/18 and known to pharmacy for TPN dosing. He has h/o appendiceal cancer with abd carcinomatosis.  His urine cx from 4/25 reveals >100K GNR and is symptomatic with dysuria and urinary hesitancy  4/27 >> zosyn   >>  Tmax: afebrile (Tmin=97.5) WBCs: 2.7 Renal: Scr=1.26 with est CrCl = 53ml/min  4/18 blood: NG-F 4/18 urine: NG-F 4/26 urine: >100K GNR  Goal of Therapy:  Dose for renal function and indication   Plan:   Zosyn 3.375 gm IV q8h each dose over 4h   Await final cultures and narrow as appropriate.   Dannielle Huh 04/12/2013,8:53 AM

## 2013-04-12 NOTE — Progress Notes (Signed)
[facility] PROGRESS NOTE  [name] ZOX:[id number] DOB: 04-02-1956 DOA: 04/03/2013 PCP: [name], MD  Brief HPI:   57 year old gentleman with h/o appendiceal carcinoma, abdominal carcinomatosis, with multiple abdominal surgeries, has a G tube in place attached to drain as needed, an ileostomy and a enterocutaneous fistula, was recently discharged from [facility] center after an extended stay of 2 months. During his stay he received chemo every other week and was discharged to [facility] health for rehabilitation. He was brought to [facility] ED after less than a day stay at [facility] for nausea, vomiting and dehydration. On arrival to ED he was found to be dehydrated, hypotensive, hypokalemic and in acute renal failure. Over this hospitalization we have been giving him IV fluids in the form of NS, Dextrose 5 fluids to keep his BP and repleting his electrolytes.    Assessment/Plan:  Hypotension from dehydration secondary to nausea and vomiting - Nausea and vomiting resolved.   UTI - GNRs in the urine 10^5 with symptoms - given prolonged hospitalization, Zosyn pending sensitivities   Acute renal failure/ metabolic alkalosis  -  Improved, secondary to ongoing GI loses. Nephrology following, appreciate continuous input.  - difficult to manage his fluid status - have tried to use Octreotide to decrease gi losses, patient refused.   Over the past 24 hours: IN (ml): 8,760 (TPN + po + 0.45% NaCl with 20 mEq K) O (ml):  11,300  Current drips: TPN 100 cc/h; 1/2 NS with 40 KCl 250 cc/h continuous.   Lab Results  Component Value Date   NA 140 04/12/2013   NA 145 04/11/2013   NA 148* 04/11/2013   NA 143 04/10/2013   NA 139 04/09/2013   NA 133* 04/09/2013   NA 132* 04/09/2013   NA 144 04/08/2013   NA 144 04/07/2013   NA 143 04/06/2013   Lab Results  Component Value Date   K 4.1 04/12/2013   K 3.8 04/11/2013   K 3.5 04/11/2013   K 4.1 04/10/2013   K  4.4 04/09/2013   K 4.4 04/09/2013   K 3.5 04/09/2013   K 3.6 04/08/2013   K 3.0* 04/07/2013   K 2.8* 04/07/2013    Abdominal carcinomatosis - Treatment per his oncologist. Patient presently follows at [facility] wit Dr Leavy Cella. Not a candidate for chemotherapy per our assessment but patient insists that his oncologist (Dr. Leavy Cella) plans to continue chemotherapy and he wishes to be stabilized as soon as possible so that he can resume his chemo.  - patient asked today if there is anyone else from oncology in the area that is willing to offer him chemo.  History of gout - presently not having any flares. Neuropathic pain - persistent on Lyrica 50 TID, will increase to 75 TID. Hypokalemia: replete as needed. Hyper/hyponatremia - management as above. Nutrition on TPN and clear liquid diet. G tube drain per shift. DVT prophylaxis - lovenox  HPI/Subjective: - worsening of burning with urination  Objective: Filed Vitals:   04/12/13 0020 04/12/13 0205 04/12/13 0400 04/12/13 0415  BP: 111/74 122/76  108/72  Pulse:      Temp:   97.4 F (36.3 C)   TempSrc:   Oral   Resp: 21 19  16   Height:      Weight:    86 kg (189 lb 9.5 oz)  SpO2: 100% 100%  100%    Intake/Output Summary (Last 24 hours) at 04/12/13 8119 Last data filed  at 04/12/13 0600  Gross per 24 hour  Intake   7860 ml  Output  16109 ml  Net  -3140 ml   Filed Weights   04/09/13 0330 04/11/13 0500 04/12/13 0415  Weight: 91.6 kg (201 lb 15.1 oz) 85.1 kg (187 lb 9.8 oz) 86 kg (189 lb 9.5 oz)   Exam:  General: Well-developed and poorly nourished.  Cardiovascular: S1-S2 heard tachycardic.  Respiratory: No rhonchi or crepitations.  Abdomen: colostomy pouch and suction tube in place.  Skin: No new rash.  Musculoskeletal: No edema.   Neurologic: Moves all extremities.  Data Reviewed: Basic Metabolic Panel:  Recent Labs Lab 04/08/13 0500 04/09/13 0345  04/09/13 2100 04/10/13 0430 04/11/13 0437 04/11/13 1655  04/12/13 0402  NA 144 132*  < > 139 143 148* 145 140  K 3.6 3.5  < > 4.4 4.1 3.5 3.8 4.1  CL 111 100  < > 112 116* 115* 114* 113*  CO2 29 25  < > 20 21 23 23 21   GLUCOSE 80 104*  < > 120* 130* 151* 127* 112*  BUN 38* 34*  < > 31* 30* 30* 31* 33*  CREATININE 1.60* 1.52*  < > 1.41* 1.34 1.29 1.22 1.26  CALCIUM 7.4* 8.4  < > 8.3* 8.4 9.0 9.1 8.6  MG 1.2* 1.3*  --   --  1.5 1.6  --  1.8  PHOS 2.3 3.2  --   --  4.9* 6.5*  --  4.8*  < > = values in this interval not displayed. Liver Function Tests:  Recent Labs Lab 04/06/13 0400 04/09/13 0345  AST 49* 28  ALT 56* 25  ALKPHOS 289* 192*  BILITOT 0.8 0.5  PROT 5.8* 4.5*  ALBUMIN 2.1* 1.5*   CBC:  Recent Labs Lab 04/06/13 0400 04/10/13 0430 04/10/13 1415 04/11/13 0437 04/12/13 0402  WBC 3.6* 2.3*  --  3.3* 2.7*  NEUTROABS 2.2  --   --   --   --   HGB 8.8* 6.2* 7.5* 7.7* 7.5*  HCT 27.0* 18.6* 22.0* 22.4* 21.7*  MCV 99.3 96.4  --  93.3 93.5  PLT 251 176  --  208 190   BNP (last 3 results)  Recent Labs  12/22/12 1700  PROBNP 33.8   CBG:  Recent Labs Lab 04/11/13 0801 04/11/13 1216 04/11/13 1630 04/11/13 2034 04/12/13 0002  GLUCAP 115* 147* 116* 122* 110*    Recent Results (from the past 240 hour(s))  CULTURE, BLOOD (ROUTINE X 2)     Status: None   Collection Time    04/03/13  3:00 AM      Result Value Range Status   Specimen Description BLOOD LEFT HAND   Final   Special Requests BOTTLES DRAWN AEROBIC AND ANAEROBIC 5CC   Final   Culture  Setup Time 04/03/2013 08:32   Final   Culture NO GROWTH 5 DAYS   Final   Report Status 04/09/2013 FINAL   Final  CULTURE, BLOOD (ROUTINE X 2)     Status: None   Collection Time    04/03/13  3:05 AM      Result Value Range Status   Specimen Description BLOOD RIGHT HAND   Final   Special Requests BOTTLES DRAWN AEROBIC AND ANAEROBIC 5CC   Final   Culture  Setup Time 04/03/2013 08:32   Final   Culture NO GROWTH 5 DAYS   Final   Report Status 04/09/2013 FINAL   Final   URINE CULTURE     Status:  None   Collection Time    04/03/13  3:45 AM      Result Value Range Status   Specimen Description URINE, CLEAN CATCH   Final   Special Requests NONE   Final   Culture  Setup Time 04/03/2013 08:39   Final   Colony Count NO GROWTH   Final   Culture NO GROWTH   Final   Report Status 04/04/2013 FINAL   Final  MRSA PCR SCREENING     Status: Abnormal   Collection Time    04/03/13  5:00 AM      Result Value Range Status   MRSA by PCR POSITIVE (*) NEGATIVE Final   Comment:            The GeneXpert MRSA Assay (FDA     approved for NASAL specimens     only), is one component of a     comprehensive MRSA colonization     surveillance program. It is not     intended to diagnose MRSA     infection nor to guide or     monitor treatment for     MRSA infections.     RESULT CALLED TO, READ BACK BY AND VERIFIED WITH:     C.CAGLE AT 0743 ON 18APR14 BY C.BONGEL  URINE CULTURE     Status: None   Collection Time    04/11/13  4:55 AM      Result Value Range Status   Specimen Description URINE, CLEAN CATCH   Final   Special Requests NONE   Final   Culture  Setup Time 04/11/2013 11:57   Final   Colony Count >=100,000 COLONIES/ML   Final   Culture GRAM NEGATIVE RODS   Final   Report Status PENDING   Incomplete   Studies: No results found.  Scheduled Meds: . antiseptic oral rinse  15 mL Mouth Rinse q12n4p  . chlorhexidine  15 mL Mouth Rinse BID  . clindamycin  1 application Topical BID  . enoxaparin (LOVENOX) injection  40 mg Subcutaneous Q24H  . fludrocortisone  0.1 mg Oral BID  . insulin aspart  0-15 Units Subcutaneous Q4H  . midodrine  10 mg Oral TID  . octreotide  100 mcg Subcutaneous Q8H  . pantoprazole  40 mg Oral q morning - 10a  . pregabalin  75 mg Oral TID  . sodium chloride  3 mL Intravenous Q12H  . tamsulosin  0.4 mg Oral QPC supper  . vitamin C  500 mg Oral q morning - 10a  . zinc sulfate  220 mg Oral q morning - 10a   Continuous Infusions: .  sodium chloride 0.45 % with kcl 250 mL/hr at 04/12/13 0625  . TPN (CLINIMIX) Adult without lytes 100 mL/hr at 04/11/13 1743   Principal Problem:   Dehydration secondary to nausea and vomiting Active Problems:   Abdominal malignant neoplasm   Hypokalemia   Acute kidney injury secondary to dehydration  Total time: 25 min  Pamella Pert  [facility] Pager [phone]. If 7PM-7AM, please contact night-coverage at [email or web address], password [facility] 04/12/2013, 8:19 AM  LOS: 9 days

## 2013-04-13 LAB — DIFFERENTIAL
Eosinophils Relative: 1 % (ref 0–5)
Lymphocytes Relative: 30 % (ref 12–46)
Lymphs Abs: 0.9 10*3/uL (ref 0.7–4.0)
Monocytes Absolute: 0.4 10*3/uL (ref 0.1–1.0)
Monocytes Relative: 14 % — ABNORMAL HIGH (ref 3–12)

## 2013-04-13 LAB — BASIC METABOLIC PANEL
BUN: 40 mg/dL — ABNORMAL HIGH (ref 6–23)
Chloride: 109 mEq/L (ref 96–112)
GFR calc Af Amer: 57 mL/min — ABNORMAL LOW (ref >90)
Potassium: 4.7 mEq/L (ref 3.5–5.1)

## 2013-04-13 LAB — CBC
HCT: 21.9 % — ABNORMAL LOW (ref 39.0–52.0)
MCH: 32.2 pg (ref 26.0–34.0)
MCV: 94 fL (ref 78.0–100.0)
RBC: 2.33 MIL/uL — ABNORMAL LOW (ref 4.22–5.81)
WBC: 3.1 10*3/uL — ABNORMAL LOW (ref 4.0–10.5)

## 2013-04-13 LAB — URINE CULTURE: Colony Count: >=100000

## 2013-04-13 LAB — COMPREHENSIVE METABOLIC PANEL
ALT: 48 U/L (ref 0–53)
AST: 56 U/L — ABNORMAL HIGH (ref 0–37)
Alkaline Phosphatase: 274 U/L — ABNORMAL HIGH (ref 39–117)
CO2: 22 mEq/L (ref 19–32)
Calcium: 8.7 mg/dL (ref 8.4–10.5)
Potassium: 4.2 mEq/L (ref 3.5–5.1)
Sodium: 138 mEq/L (ref 135–145)
Total Protein: 5.6 g/dL — ABNORMAL LOW (ref 6.0–8.3)

## 2013-04-13 LAB — GLUCOSE, CAPILLARY
Glucose-Capillary: 121 mg/dL — ABNORMAL HIGH (ref 70–99)
Glucose-Capillary: 122 mg/dL — ABNORMAL HIGH (ref 70–99)
Glucose-Capillary: 110 mg/dL — ABNORMAL HIGH (ref 70–99)

## 2013-04-13 MED ORDER — TRACE MINERALS CR-CU-F-FE-I-MN-MO-SE-ZN IV SOLN
INTRAVENOUS | Status: AC
  Administered 2013-04-13: 18:00:00 via INTRAVENOUS
  Filled 2013-04-13: qty 2400

## 2013-04-13 MED ORDER — MAGNESIUM SULFATE 40 MG/ML IJ SOLN
2.0000 g | Freq: Once | INTRAMUSCULAR | Status: AC
  Administered 2013-04-13: 2 g via INTRAVENOUS
  Filled 2013-04-13: qty 50

## 2013-04-13 MED ORDER — FAT EMULSION 20 % IV EMUL
250.0000 mL | INTRAVENOUS | Status: AC
  Administered 2013-04-13: 250 mL via INTRAVENOUS
  Filled 2013-04-13: qty 250

## 2013-04-13 MED ORDER — CIPROFLOXACIN IN D5W 400 MG/200ML IV SOLN
400.0000 mg | Freq: Two times a day (BID) | INTRAVENOUS | Status: DC
Start: 2013-04-13 — End: 2013-04-17
  Administered 2013-04-13 – 2013-04-17 (×9): 400 mg via INTRAVENOUS
  Filled 2013-04-13 (×11): qty 200

## 2013-04-13 NOTE — Progress Notes (Signed)
Reported off to shea-RN in step-down. Patient transferred to step-down 1234, alert/oriented and in stable condition.

## 2013-04-13 NOTE — Progress Notes (Signed)
[facility] PROGRESS NOTE  [name] HYQ:[id number] DOB: May 27, 1956 DOA: 04/03/2013 PCP: [name], MD  Brief HPI:   57 year old gentleman with h/o appendiceal carcinoma, abdominal carcinomatosis, with multiple abdominal surgeries, has a G tube in place attached to drain as needed, an ileostomy and a enterocutaneous fistula, was recently discharged from [facility] medical center after an extended stay of 2 months. During his stay he received chemo every other week and was discharged to [facility] for rehabilitation. He was brought to [facility] ED after less than a day stay at [facility] for nausea, vomiting and dehydration. On arrival to ED he was found to be dehydrated, hypotensive, hypokalemic and in acute renal failure. Over this hospitalization we have been giving him IV fluids in the form of NS, Dextrose 5 fluids to keep his BP and repleting his electrolytes.    Assessment/Plan:  Acute renal failure/ metabolic alkalosis  -  Due to impressive GI losses that need to be matched 1:1. Nephrology following, appreciate input.  - difficult to manage his fluid status - have tried to use Octreotide to decrease gi losses, patient refused.   Over the past 24 hours: IN (ml): 9500 (TPN + po + 0.45% NaCl with 20 mEq K) O (ml):  8580  Current drips: TPN 100 cc/h; 1/2 NS with 40 KCl 300 cc/h continuous (changed from 250 cc/h today).   - Patient is a higher level of care for now we can achieve on the floor, so transfer back to the step down unit today. - This is unfortunate as being on the floor was a step towards patient going to rehabilitation facility. - He does not qualify for the LTAC based on his insurance yet, however if he spends 21 days in the hospital he will qualify. - I am going to try to get in touch with his oncologist from [facility], Dr. Leavy Cella today to discuss case - Patient continues to be a full code and wants to pursue aggressive chemotherapy  at this point. Should he change his mind and want to talk to palliative again he will let me know. He was seen by palliative in January of 2014 and was discharged with hospice at that time.   Abdominal carcinomatosis - Treatment per his oncologist. Patient presently follows at [facility] wit Dr Leavy Cella. Not a candidate for chemotherapy per our assessment but patient insists that his oncologist (Dr. Leavy Cella) plans to continue chemotherapy and he wishes to be stabilized as soon as possible so that he can resume his chemo.  - patient asked today if there is anyone else from oncology in the area that is willing to offer him chemo.   Pseudomonas UTI - on Ciprofloxacin based on sensitivities  History of gout - presently not having any flares. Neuropathic pain - persistent on Lyrica 50 TID, will increase to 75 TID. Hypokalemia: replete as needed. Hyper/hyponatremia - management as above. Nutrition on TPN and clear liquid diet. G tube drain per shift. DVT prophylaxis - lovenox  HPI/Subjective: - able to urinate better this morning.  Objective: Filed Vitals:   04/12/13 1528 04/12/13 1740 04/12/13 2327 04/13/13 0643  BP: 131/79 135/93 123/78 133/78  Pulse: 69 82 80 72  Temp: 97.8 F (36.6 C)  98.2 F (36.8 C) 98.7 F (37.1 C)  TempSrc: Oral  Oral Oral  Resp: 18  20 18   Height: 6\' 4"  (1.93 m)     Weight: 84.8 kg (186 lb 15.2 oz)  84.6 kg (186 lb 8.2 oz)  SpO2: 100%  100% 100%    Intake/Output Summary (Last 24 hours) at 04/13/13 0820 Last data filed at 04/13/13 0644  Gross per 24 hour  Intake 8910.01 ml  Output   8280 ml  Net 630.01 ml   Filed Weights   04/12/13 0415 04/12/13 1528 04/13/13 0643  Weight: 86 kg (189 lb 9.5 oz) 84.8 kg (186 lb 15.2 oz) 84.6 kg (186 lb 8.2 oz)   Exam:  General: Well-developed and poorly nourished.  Cardiovascular: S1-S2 heard tachycardic.  Respiratory: No rhonchi or crepitations.  Abdomen: colostomy pouch and suction tube in place.  Skin: No  new rash.  Musculoskeletal: No edema.   Neurologic: Moves all extremities.  Data Reviewed: Basic Metabolic Panel:  Recent Labs Lab 04/09/13 0345  04/10/13 0430 04/11/13 0437 04/11/13 1655 04/12/13 0402 04/12/13 1800 04/13/13 0400  NA 132*  < > 143 148* 145 140 136 138  K 3.5  < > 4.1 3.5 3.8 4.1 4.7 4.2  CL 100  < > 116* 115* 114* 113* 109 108  CO2 25  < > 21 23 23 21 20 22   GLUCOSE 104*  < > 130* 151* 127* 112* 91 103*  BUN 34*  < > 30* 30* 31* 33* 36* 40*  CREATININE 1.52*  < > 1.34 1.29 1.22 1.26 1.35 1.47*  CALCIUM 8.4  < > 8.4 9.0 9.1 8.6 8.9 8.7  MG 1.3*  --  1.5 1.6  --  1.8  --  1.6  PHOS 3.2  --  4.9* 6.5*  --  4.8*  --  5.5*  < > = values in this interval not displayed. Liver Function Tests:  Recent Labs Lab 04/09/13 0345 04/13/13 0400  AST 28 56*  ALT 25 48  ALKPHOS 192* 274*  BILITOT 0.5 0.7  PROT 4.5* 5.6*  ALBUMIN 1.5* 1.7*   CBC:  Recent Labs Lab 04/10/13 0430 04/10/13 1415 04/11/13 0437 04/12/13 0402 04/12/13 1800 04/13/13 0400  WBC 2.3*  --  3.3* 2.7* 3.4* 3.1*  NEUTROABS  --   --   --   --   --  1.7  HGB 6.2* 7.5* 7.7* 7.5* 8.1* 7.5*  HCT 18.6* 22.0* 22.4* 21.7* 23.2* 21.9*  MCV 96.4  --  93.3 93.5 93.5 94.0  PLT 176  --  208 190 219 210   BNP (last 3 results)  Recent Labs  12/22/12 1700  PROBNP 33.8   CBG:  Recent Labs Lab 04/12/13 0402 04/12/13 0810 04/12/13 1130 04/12/13 1717 04/13/13 0435  GLUCAP 122* 113* 110* 106* 121*    Recent Results (from the past 240 hour(s))  URINE CULTURE     Status: None   Collection Time    04/11/13  4:55 AM      Result Value Range Status   Specimen Description URINE, CLEAN CATCH   Final   Special Requests NONE   Final   Culture  Setup Time 04/11/2013 11:57   Final   Colony Count >=100,000 COLONIES/ML   Final   Culture PSEUDOMONAS AERUGINOSA   Final   Report Status 04/13/2013 FINAL   Final   Organism ID, Bacteria PSEUDOMONAS AERUGINOSA   Final   Studies: No results  found.  Scheduled Meds: . antiseptic oral rinse  15 mL Mouth Rinse q12n4p  . chlorhexidine  15 mL Mouth Rinse BID  . ciprofloxacin  400 mg Intravenous Q12H  . clindamycin  1 application Topical BID  . enoxaparin (LOVENOX) injection  40 mg Subcutaneous Q24H  . fludrocortisone  0.1 mg Oral BID  . insulin aspart  0-15 Units Subcutaneous Q4H  . magnesium sulfate 1 - 4 g bolus IVPB  2 g Intravenous Once  . midodrine  10 mg Oral TID  . pantoprazole  40 mg Oral q morning - 10a  . pregabalin  75 mg Oral TID  . sodium chloride  3 mL Intravenous Q12H  . tamsulosin  0.4 mg Oral Daily  . vitamin C  500 mg Oral q morning - 10a  . zinc sulfate  220 mg Oral q morning - 10a   Continuous Infusions: . Marland KitchenTPN (CLINIMIX-E) Adult 100 mL/hr at 04/12/13 1812  . 0.45 % NaCl with KCl 20 mEq / L 250 mL/hr at 04/12/13 1759  . TPN (CLINIMIX) Adult without lytes     And  . fat emulsion     Principal Problem:   Dehydration secondary to nausea and vomiting Active Problems:   Abdominal malignant neoplasm   Hypokalemia   Acute kidney injury secondary to dehydration  Total time: 25 min  Pamella Pert  [facility] Pager [phone]. If 7PM-7AM, please contact night-coverage at [email or web address], password [facility] 04/13/2013, 8:20 AM  LOS: 10 days

## 2013-04-13 NOTE — Progress Notes (Signed)
PARENTERAL NUTRITION CONSULT NOTE - FOLLOW UP  Pharmacy Consult for TPN Indication: enterocutaneous fistula, abdominal carcinomatosis  Allergies  Allergen Reactions  . Percocet (Oxycodone-Acetaminophen) Nausea And Vomiting    hallucination   Patient Measurements: Height: 6\' 4"  (193 cm) Weight: 186 lb 8.2 oz (84.6 kg) IBW/kg (Calculated) : 86.8 Usual Weight: 170-180 lbs  Vital Signs: Temp: 98.7 F (37.1 C) (04/28 0643) Temp src: Oral (04/28 0643) BP: 133/78 mmHg (04/28 0643) Pulse Rate: 72 (04/28 0643) Intake/Output from previous day: 04/27 0701 - 04/28 0700 In: 9500 [P.O.:1200; I.V.:5750; IV Piggyback:150; TPN:2400] Out: 8580 [Urine:3250; Drains:5100; Stool:230] Intake/Output from this shift:    Labs:  Recent Labs  04/12/13 0402 04/12/13 1800 04/13/13 0400  WBC 2.7* 3.4* 3.1*  HGB 7.5* 8.1* 7.5*  HCT 21.7* 23.2* 21.9*  PLT 190 219 210    Recent Labs  04/11/13 0437  04/12/13 0402 04/12/13 1800 04/13/13 0400  NA 148*  < > 140 136 138  K 3.5  < > 4.1 4.7 4.2  CL 115*  < > 113* 109 108  CO2 23  < > 21 20 22   GLUCOSE 151*  < > 112* 91 103*  BUN 30*  < > 33* 36* 40*  CREATININE 1.29  < > 1.26 1.35 1.47*  CALCIUM 9.0  < > 8.6 8.9 8.7  MG 1.6  --  1.8  --  1.6  PHOS 6.5*  --  4.8*  --  5.5*  PROT  --   --   --   --  5.6*  ALBUMIN  --   --   --   --  1.7*  AST  --   --   --   --  56*  ALT  --   --   --   --  48  ALKPHOS  --   --   --   --  274*  BILITOT  --   --   --   --  0.7  < > = values in this interval not displayed. Estimated Creatinine Clearance: 67.1 ml/min (by C-G formula based on Cr of 1.47).    Recent Labs  04/12/13 1130 04/12/13 1717 04/13/13 0435  GLUCAP 110* 106* 121*    Insulin Requirements in the past 24 hours:  6 units SSI required/24h, hypoglycemia resolved (from 4/23) Regular insulin removed from TPN 4/23  IVF per Nephrology: 1/2NS with 20KCl at 272ml.hr (decreased for KCl 4/27)  Current Nutrition:  Clinimix E5/20 at  100 ml/hr + lipids at 10 ml/hr MWF. Full liquids  Nutritional Goals:  RD recommendations (4/18): Kcal: 2220-2565 Protein: 100-132 grams Fluid: 2.3-2.6 L   Clinimix E5/20 at a goal rate of 100 ml/hr + IVFE 20% at 10 ml/hr on MWF to provide: 120g/day protein, 2318 Kcal/day avg.(2112 Kcal/day STTHS, 2592 Kcal/day MWF).    Assessment:  64 yoM with complicated PMH of metastatic adenocarcinoma, peritoneal carcinomatosis s/p extensive debulking procedures and abdominal surgeries which has left him with multiple drains and fistulas on chronic TPN via a PICC line. Discharged from [name] recently and was transferred to his nursing home in [place] started developing N/V and weakness. Was receiving TPN cycled over 18 hours at [facility]. Will continue TPN inpatient but will be difficult to provide large protein and kcal amounts with premixed Clinimix TNAs.  RD evaluated the patient 4/18 and recommended a reduced protein goal than PTA.  Pharmacy will be able to meet new nutrition goals with the premixed Clinimix product.  Will provide  TNA continuously inpatient due to high volume that needs to be administered with premixed product.  Labs:  CBGs: at goal < 150mg /dl  Electrolytes: Severe hypokalemia resolved (4.2 today); Na better with change to 1/2NS, Phos rising again with addition of lytes to TNA 4/27; Corr Ca = 10.54 (Phos x Ca = 48).  Mg low/normal.   LFTs: AST/ALT now rising, Alk Phos remains elevated and rising  TGs: 195 (4/19), 177 (4/21), pending today  Prealbumin: 33 (4/19), 28.2 (4/21), pending today  Renal: ARF from hypotension/volume depletion; SCr rising again. I/O +1270/24h, UOP=3.2L (up by ~4L), drains= 5.1L. Renal following (contraction alkalosis resolved from admission, d/t severe volume depletion). Have now signed off.  Octreotide ordered 4/24 in an attempt to reduce drainage output but patient refusing  4/28: LFTs rising, may be a complication of prolonged TNA. May need to  consider cycling TNA at night to help. Will await MD assessment to evaluate other etiologies for elevation.  Plan:  At 1800 today:  Once again, remove electrolytes from TNA today due to elevated phos. Only formulation is Clinimix 5/15, continue 132ml/hr. Supplement magnesium with MgSO4 2gm IV x 1 today since removing lytes from TNA tonight. Continue no insulin in TPN, particularly with decrease dextrose formulation. Fat emulsion at 10 ml/hr (MWF only due to ongoing shortage).  TNA to contain standard multivitamins and trace elements (MWF only due to ongoing shortage). Will continue to use IV as unsure of enteral absorption given massive GI losses. Also vitamin C and zinc continued from home..  TNA lab panels on Mondays & Thursdays.   Loralee Pacas, PharmD, BCPS Pager: [phone]  04/13/2013 7:09 AM

## 2013-04-13 NOTE — Progress Notes (Signed)
PT admitted to this REGULAR floor from Stepdown late this afternoon but but requires much care due to management of his 4 tubes,  Incontinence of urine, mucous from rectum, MRSA, frequent need to empty suction containers, replace leaking tubes, , large amount of ivfs,  q 4 hour CGS, pain, etc.  I spent 1.5 hours on 1:1 care with this patient at the beginning of the shift alone, and a good majority of the rest of my 12 hour shift with this very nice pt.   Pt to nurse ratio on this floor is typically 1 nurse to 6 patients.  It may be in  the best interest of this pt, and the other pts on this floor sharing a nurse, to have this pt taken back to a higher level of care floor - like Step Down.

## 2013-04-14 DIAGNOSIS — E43 Unspecified severe protein-calorie malnutrition: Secondary | ICD-10-CM

## 2013-04-14 LAB — COMPREHENSIVE METABOLIC PANEL
ALT: 37 U/L (ref 0–53)
AST: 23 U/L (ref 0–37)
Albumin: 1.7 g/dL — ABNORMAL LOW (ref 3.5–5.2)
Alkaline Phosphatase: 264 U/L — ABNORMAL HIGH (ref 39–117)
BUN: 42 mg/dL — ABNORMAL HIGH (ref 6–23)
Potassium: 4.5 mEq/L (ref 3.5–5.1)
Sodium: 137 mEq/L (ref 135–145)
Total Protein: 5.7 g/dL — ABNORMAL LOW (ref 6.0–8.3)

## 2013-04-14 LAB — BASIC METABOLIC PANEL
Calcium: 8.6 mg/dL (ref 8.4–10.5)
Creatinine, Ser: 1.53 mg/dL — ABNORMAL HIGH (ref 0.50–1.35)
GFR calc Af Amer: 57 mL/min — ABNORMAL LOW (ref >90)
Sodium: 137 mEq/L (ref 135–145)

## 2013-04-14 LAB — GLUCOSE, CAPILLARY
Glucose-Capillary: 101 mg/dL — ABNORMAL HIGH (ref 70–99)
Glucose-Capillary: 103 mg/dL — ABNORMAL HIGH (ref 70–99)

## 2013-04-14 LAB — MAGNESIUM: Magnesium: 1.7 mg/dL (ref 1.5–2.5)

## 2013-04-14 MED ORDER — CLINIMIX/DEXTROSE (5/15) 5 % IV SOLN
INTRAVENOUS | Status: AC
  Administered 2013-04-14: 19:00:00 via INTRAVENOUS
  Filled 2013-04-14: qty 2400

## 2013-04-14 MED ORDER — SODIUM CHLORIDE 0.9 % IV BOLUS (SEPSIS)
1000.0000 mL | Freq: Once | INTRAVENOUS | Status: AC
  Administered 2013-04-14: 1000 mL via INTRAVENOUS

## 2013-04-14 NOTE — Progress Notes (Signed)
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 [name] which     Action/Plan:   tbd will follow for dc needs.   Anticipated DC Date:  04/16/2013   Anticipated DC Plan:  SKILLED NURSING FACILITY  In-house referral  Clinical Social Worker      DC Planning Services  CM consult      [facility] Choice  NA   Choice offered to / List presented to:  NA   DME arranged  NA      DME agency  NA     HH arranged  NA      HH agency  NA   Status of service:  In process, will continue to follow Medicare Important Message given?  NA - LOS <3 / Initial given by admissions (If response is "NO", the following Medicare IM given date fields will be blank) Date Medicare IM given:   Date Additional Medicare IM given:    Discharge Disposition:    Per UR Regulation:  Reviewed for med. necessity/level of care/duration of stay  If discussed at Long Length of Stay Meetings, dates discussed:    Comments:  [id number]/Rhonda Davis,RN,BSN,CCM:  patient requested conference with cm.  Wanted to know of what ltac facilities were in the [place] area.  Informed him of [facility] and [facility].  [facility] at [facility] past but not sure if this is a via place for him to go.  Will have both representative to see the patient.  [id number]/QIONGE Earlene Plater, RN, BSN, CCM: continuous renal replacement, transferred back to icu on [id number].  CHART REVIEWED AND UPDATED.  Next chart review due on [id number]. NO DISCHARGE NEEDS PRESENT AT THIS TIME. CASE MANAGEMENT [phone]   [id number]/VFIEPP Earlene Plater, RN, BSN, CCM:  CHART REVIEWED AND UPDATED.   Next chart review due on [id number]. NO DISCHARGE NEEDS PRESENT AT THIS TIME. CASE MANAGEMENT [phone]   [id number]/DUKGUR Earlene Plater, RN, BSN, CCM:  CHART REVIEWED AND UPDATED.  Next chart review due on [id number]. NO DISCHARGE NEEDS PRESENT AT THIS TIME. CASE MANAGEMENT [phone]   [id number]/YIRSWN Earlene Plater, RN, BSN, CCM:  CHART REVIEWED AND UPDATED.  Next chart review due on [id number]. NO DISCHARGE NEEDS PRESENT AT THIS TIME. CASE MANAGEMENT [phone]

## 2013-04-14 NOTE — Progress Notes (Addendum)
Physical Therapy Treatment Patient Details Name: [name] MRN: [id number] DOB: [birth year] Today's Date: 04/14/2013 Time: 4098-1191 PT Time Calculation (min): 30 min  PT Assessment / Plan / Recommendation Comments on Treatment Session  Pt was tired and was assisted OOB to chair, which had 2 pillows in the seat to minimize irritation to wound on buttocks. Pt remains motivated and courteous, needing very little assistance other than for management of lines and observation of vitals.  Vitals in bed: HR 82, BP 121/78 (89) ; EOB: HR 96, BP 116/84 ;  Standing: HR 121 , BP 105/67  ; O2 remained at 100% throughout.   Will consult evaluating LPT for updated pt goals.    Frequency Min 3X/week   Plan Discharge plan remains appropriate;Frequency remains appropriate    Precautions / Restrictions Precautions Precautions: Fall Precaution Comments: multiple lines/drains Restrictions Weight Bearing Restrictions: No   Pertinent Vitals/Pain Pt stated that he had MAX pain from UTI. Position was changed.    Mobility  Bed Mobility Bed Mobility: Supine to Sit Supine to Sit: 4: Min guard;HOB elevated Details for Bed Mobility Assistance: Min/guard to ensure safety of trunk when sitting.  Pt somewhat impulsive with bed mobility.  Transfers Transfers: Sit to Stand;Stand to Sit Sit to Stand: 1: +2 Total assist;From bed;From elevated surface Sit to Stand: Patient Percentage: 80% Stand to Sit: 4: Min assist;To chair/3-in-1;With upper extremity assist Details for Transfer Assistance: +2 for management of lines and for safety    Exercises General Exercises - Lower Extremity Hip Flexion/Marching: AROM;Both;10 reps;Standing Mini-Sqauts: AROM;Both;5 reps;Standing     PT Goals Acute Rehab PT Goals PT Goal Formulation: With patient Potential to Achieve Goals: Good Pt will go Sit to Stand: with min assist PT Goal: Sit to Stand - Progress: Progressing toward goal Pt will go Stand to Sit: with min  assist PT Goal: Stand to Sit - Progress: Met  Visit Information  Last PT Received On: 04/14/13 Assistance Needed: +2    Subjective Data   "I think this dizziness from sitting up is because I've been laying in bed so much."   Cognition    Good   Balance   Good  End of Session PT - End of Session Equipment Utilized During Treatment: Gait belt Activity Tolerance: Patient limited by fatigue Patient left: in chair;with call bell/phone within reach;with nursing in room   GP     BROWN-SMEDLEY, NICOLE 04/14/2013, 2:08 PM  Felecia Shelling  PTA [facility]  Acute  Rehab Pager      [phone]

## 2013-04-14 NOTE — Progress Notes (Signed)
PARENTERAL NUTRITION CONSULT NOTE - FOLLOW UP  Pharmacy Consult for TPN Indication: enterocutaneous fistula, abdominal carcinomatosis  Allergies  Allergen Reactions  . Percocet (Oxycodone-Acetaminophen) Nausea And Vomiting    hallucination   Patient Measurements: Height: 6\' 4"  (193 cm) Weight: 184 lb 1.4 oz (83.5 kg) IBW/kg (Calculated) : 86.8 Usual Weight: 170-180 lbs  Vital Signs: Temp: 97.6 F (36.4 C) (04/29 0800) Temp src: Oral (04/29 0800) BP: 119/86 mmHg (04/29 0800) Pulse Rate: 73 (04/29 0800) Intake/Output from previous day: 04/28 0701 - 04/29 0700 In: 9427.5 [P.O.:900; I.V.:6947.5; IV Piggyback:450; TPN:1100] Out: 9875 [Urine:5875; Drains:3850; Stool:150] Intake/Output from this shift: Total I/O In: 300 [I.V.:300] Out: -   Labs:  Recent Labs  04/12/13 0402 04/12/13 1800 04/13/13 0400  WBC 2.7* 3.4* 3.1*  HGB 7.5* 8.1* 7.5*  HCT 21.7* 23.2* 21.9*  PLT 190 219 210    Recent Labs  04/12/13 0402  04/13/13 0400 04/13/13 1840 04/14/13 0500  NA 140  < > 138 140 137  K 4.1  < > 4.2 4.7 4.5  CL 113*  < > 108 109 107  CO2 21  < > 22 22 21   GLUCOSE 112*  < > 103* 104* 107*  BUN 33*  < > 40* 40* 42*  CREATININE 1.26  < > 1.47* 1.52* 1.62*  CALCIUM 8.6  < > 8.7 9.3 8.8  MG 1.8  --  1.6  --  1.7  PHOS 4.8*  --  5.5*  --  5.8*  PROT  --   --  5.6*  --  5.7*  ALBUMIN  --   --  1.7*  --  1.7*  AST  --   --  56*  --  23  ALT  --   --  48  --  37  ALKPHOS  --   --  274*  --  264*  BILITOT  --   --  0.7  --  0.4  PREALBUMIN  --   --  18.6  --   --   TRIG  --   --  164*  --   --   CHOL  --   --  108  --   --   < > = values in this interval not displayed. Estimated Creatinine Clearance: 60.1 ml/min (by C-G formula based on Cr of 1.62).    Recent Labs  04/14/13 0043 04/14/13 0348 04/14/13 0741  GLUCAP 103* 106* 105*    Insulin Requirements in the past 24 hours:  CBGs < 150, 6 units moderate SSI required/24h, Regular insulin removed from TPN  4/23  IVF per Nephrology: 1/2NS with 20KCl at 463ml/hr  Current Nutrition:  Clinimix 5/15 at 100 ml/hr + lipids at 10 ml/hr MWF. Full liquid starting 4/25 (PO intake decreasing)  Nutritional Goals:  RD recs (4/18): Kcal: 2220-2565 Protein: 100-132 grams Fluid: 2.3-2.6 L   Clinimix E5/20 at a goal rate of 100 ml/hr + IVFE 20% at 10 ml/hr on MWF to provide: 120g/day protein, 2318 Kcal/day avg.(2112 Kcal/day STTHS, 2592 Kcal/day MWF).   Clinimix 5/15 without lytes @ 100 ml/hr + IVFE 20% at 10 ml/hr on MWF will provide 120 g/day protein, average of 1910 Kcal/day   Assessment:  12 yoM with complicated PMH of metastatic adenocarcinoma, peritoneal carcinomatosis s/p extensive debulking procedures and abdominal surgeries which has left him with multiple drains and fistulas on chronic TPN via a PICC line. Discharged from [facility] recently and was transferred to his nursing home in [place] started  developing N/V and weakness. Was receiving TPN cycled over 18 hours at [facility]. Will continue TPN inpatient but will be difficult to provide large protein and kcal amounts with premixed Clinimix TNAs.  RD evaluated the patient 4/18 and recommended a reduced protein goal than PTA.  Pharmacy will be able to meet new nutrition goals with the premixed Clinimix product.  Will provide TNA continuously inpatient due to high volume that needs to be administered with premixed product.  Labs:  CBGs: at goal < 150mg /dl  Electrolytes: Severe hypokalemia resolved (4.5 today); Na better with change to 1/2NS, Phos continues to rise to 5.8; Corr Ca = 10.6 (Phos x Ca = 62).  Mg 1.7 s/p replacement 4/28.  LFTs: AST/ALT wnl, watch Kphos  TGs: 195 (4/19), 177 (4/21), 164 (4/28)  Prealbumin: 33 (4/19), 28.2 (4/21), 18.6 (4/28)  Renal: ARF from hypotension/volume depletion; SCr rising again. I/O -41ml/24h, UOP = 2.9 ml/kg/hr, drains= 3.85L. IVF increased this AM by MD.    Plan:  At 1800 today:  Continue TNA  without electrolytes given rising phosphorus.  The only available formulation is Clinimix 5/15, continue 160ml/hr.  Continue no insulin in TPN, particularly with decrease dextrose formulation. Fat emulsion at 10 ml/hr (MWF only due to ongoing shortage).  TNA to contain standard multivitamins and trace elements (MWF only due to ongoing shortage). Will continue to use IV as unsure of enteral absorption given massive GI losses. Also vitamin C and zinc continued from home..  TNA lab panels on Mondays & Thursdays.   Geoffry Paradise, PharmD, BCPS Pager: [phone] 8:58 AM Pharmacy #: [phone]

## 2013-04-14 NOTE — Progress Notes (Addendum)
[facility] PROGRESS NOTE  [name] HYQ:[id number] DOB: 08-17-1956 DOA: 04/03/2013 PCP: [name], MD  Brief HPI:   57 year old gentleman with h/o appendiceal carcinoma, abdominal carcinomatosis, with multiple abdominal surgeries, has a G tube in place attached to drain as needed, an ileostomy and a enterocutaneous fistula, was recently discharged from [facility] medical center after an extended stay of 2 months. During his stay he received chemo every other week and was discharged to [facility] health for rehabilitation. He was brought to [facility] ED after less than a day stay at [facility] for nausea, vomiting and dehydration. On arrival to ED he was found to be dehydrated, hypotensive, hypokalemic and in acute renal failure. Over this hospitalization we have been giving him IV fluids in the form of NS, Dextrose 5 fluids to keep his BP and repleting his electrolytes. Patient clearly requires high level of nursing care and he would be a good LTACH candidate however his insurance only will approve that if he spends 21 days in the hospital. Nephrology has been consulted in the acute setting to help manage fluid replacements and we were able to obtain a set rate of fluid infusion rather than boluses based on losses. Patient was transferred to the regular floor 4/27 with hopes that it will be easier to transition to rehab, however the nursing care demands were very high and he needed to be back in the SD on 4/28. Even though we matched his IV fluids with documented losses, his renal function declined while on the floor. On 4/26 complained of burning with urination, urinalysis with culture showed Pseudomonas UTI sensitive to Ciprofloxacin. Treatment was started on 4/27 with Zosyn which was narrowed down on 4/28 once sensitivities were available. He would need to complete a 10-14 day course from 4/26. Since Dr. Leavy Cella is willing to offer [name] chemotherapy, I contacted  Dr. Leavy Cella on 4/29 (his cell # is [phone]); after discussing the case, his impression was that he may not be a good chemotherapy candidate but if he improves he will consider it. Our best course of action at this point is to transfer patient to [facility] as soon as possible. Per case manager, his insurance will approve LTACH after 21 days of hospitalization.  Assessment/Plan:  Acute renal failure/ metabolic alkalosis  -  Due to impressive GI losses that need to be matched 1:1. Nephrology followed patient, signed off 4/28.  - difficult to manage his fluid status - have tried to use Octreotide to decrease gi losses, patient refused.   Over the past 24 hours: IN (ml): 9537 (TPN + po + 0.45% NaCl with 20 mEq K) O (ml):  9875  Current drips: TPN 100 cc/h; 1/2 NS with 40 KCl 400 cc/h continuous (changed from 300 cc/h today). Also 1000 mL NS bolus for signs of dehydration.    - Patient is a higher level of care for now we can achieve on the floor, so staying in step down unit. - This is unfortunate as being on the floor was a step towards patient going to rehabilitation facility. - He does not qualify for the LTAC based on his insurance yet, however if he spends 21 days in the hospital he will qualify. - Patient continues to be a full code and wants to pursue aggressive chemotherapy at this point. Should he change his mind and want to talk to palliative again he will let me know. He was seen by palliative in January of 2014  and was discharged with hospice at that time.  - He appears very focused on obtaining chemotherapy for his cancer; I had long conversations with the patient and the patient's brother today that he has significant multiple additional problems and chemotherapy may shrink the tumor further, however will not be able to fix ongoing severe GI losses, his deconditioning,  the fact that he is not able to eat and relies on TPN , and overall health. I asked him if he considers that being in the  hospital for the past 3 months represents quality of life that he'll like for himself, however he avoids answering directly that question and has hopes that chemotherapy would get him back on his feet.  Abdominal carcinomatosis - Treatment per his oncologist.  - patient asked if there is anyone else from oncology in the area that is willing to offer him chemo.   Pseudomonas UTI - on Ciprofloxacin based on sensitivities  History of gout - presently not having any flares. Neuropathic pain - persistent on Lyrica 50 TID, will increase to 75 TID. Hypokalemia: replete as needed. Hyper/hyponatremia - management as above. Nutrition on TPN and clear liquid diet. G tube drain per shift. DVT prophylaxis - lovenox  HPI/Subjective: - Somewhat frustrated today because he is unable to get chemotherapy  Objective: Filed Vitals:   04/14/13 0200 04/14/13 0400 04/14/13 0700 04/14/13 0800  BP: 119/84 116/75  119/86  Pulse: 87 73 66 73  Temp:  97.7 F (36.5 C)  97.6 F (36.4 C)  TempSrc:  Oral  Oral  Resp: 22 19 16 19   Height:      Weight:      SpO2: 100% 100% 100% 100%    Intake/Output Summary (Last 24 hours) at 04/14/13 1056 Last data filed at 04/14/13 1032  Gross per 24 hour  Intake 10721.66 ml  Output   9575 ml  Net 1146.66 ml   Filed Weights   04/12/13 1528 04/13/13 0643 04/13/13 1300  Weight: 84.8 kg (186 lb 15.2 oz) 84.6 kg (186 lb 8.2 oz) 83.5 kg (184 lb 1.4 oz)   Exam:  General: Well-developed and poorly nourished.  Cardiovascular: S1-S2 heard tachycardic.  Respiratory: No rhonchi or crepitations.  Abdomen: colostomy pouch and suction tube in place.  Skin: No new rash.  Musculoskeletal: No edema.   Neurologic: Moves all extremities.  Data Reviewed: Basic Metabolic Panel:  Recent Labs Lab 04/10/13 0430 04/11/13 0437  04/12/13 0402 04/12/13 1800 04/13/13 0400 04/13/13 1840 04/14/13 0500  NA 143 148*  < > 140 136 138 140 137  K 4.1 3.5  < > 4.1 4.7 4.2 4.7 4.5  CL  116* 115*  < > 113* 109 108 109 107  CO2 21 23  < > 21 20 22 22 21   GLUCOSE 130* 151*  < > 112* 91 103* 104* 107*  BUN 30* 30*  < > 33* 36* 40* 40* 42*  CREATININE 1.34 1.29  < > 1.26 1.35 1.47* 1.52* 1.62*  CALCIUM 8.4 9.0  < > 8.6 8.9 8.7 9.3 8.8  MG 1.5 1.6  --  1.8  --  1.6  --  1.7  PHOS 4.9* 6.5*  --  4.8*  --  5.5*  --  5.8*  < > = values in this interval not displayed. Liver Function Tests:  Recent Labs Lab 04/09/13 0345 04/13/13 0400 04/14/13 0500  AST 28 56* 23  ALT 25 48 37  ALKPHOS 192* 274* 264*  BILITOT 0.5 0.7 0.4  PROT 4.5* 5.6* 5.7*  ALBUMIN 1.5* 1.7* 1.7*   CBC:  Recent Labs Lab 04/10/13 0430 04/10/13 1415 04/11/13 0437 04/12/13 0402 04/12/13 1800 04/13/13 0400  WBC 2.3*  --  3.3* 2.7* 3.4* 3.1*  NEUTROABS  --   --   --   --   --  1.7  HGB 6.2* 7.5* 7.7* 7.5* 8.1* 7.5*  HCT 18.6* 22.0* 22.4* 21.7* 23.2* 21.9*  MCV 96.4  --  93.3 93.5 93.5 94.0  PLT 176  --  208 190 219 210   BNP (last 3 results)  Recent Labs  12/22/12 1700  PROBNP 33.8   CBG:  Recent Labs Lab 04/13/13 1602 04/13/13 1920 04/14/13 0043 04/14/13 0348 04/14/13 0741  GLUCAP 110* 105* 103* 106* 105*    Recent Results (from the past 240 hour(s))  URINE CULTURE     Status: None   Collection Time    04/11/13  4:55 AM      Result Value Range Status   Specimen Description URINE, CLEAN CATCH   Final   Special Requests NONE   Final   Culture  Setup Time 04/11/2013 11:57   Final   Colony Count >=100,000 COLONIES/ML   Final   Culture PSEUDOMONAS AERUGINOSA   Final   Report Status 04/13/2013 FINAL   Final   Organism ID, Bacteria PSEUDOMONAS AERUGINOSA   Final   Studies: No results found.  Scheduled Meds: . antiseptic oral rinse  15 mL Mouth Rinse q12n4p  . chlorhexidine  15 mL Mouth Rinse BID  . ciprofloxacin  400 mg Intravenous Q12H  . clindamycin  1 application Topical BID  . enoxaparin (LOVENOX) injection  40 mg Subcutaneous Q24H  . fludrocortisone  0.1 mg Oral BID   . insulin aspart  0-15 Units Subcutaneous Q4H  . midodrine  10 mg Oral TID  . pantoprazole  40 mg Oral q morning - 10a  . pregabalin  75 mg Oral TID  . sodium chloride  1,000 mL Intravenous Once  . sodium chloride  3 mL Intravenous Q12H  . tamsulosin  0.4 mg Oral Daily  . vitamin C  500 mg Oral q morning - 10a  . zinc sulfate  220 mg Oral q morning - 10a   Continuous Infusions: . 0.45 % NaCl with KCl 20 mEq / L 400 mL/hr at 04/14/13 1009  . TPN (CLINIMIX) Adult without lytes 100 mL/hr at 04/13/13 1734   And  . fat emulsion 250 mL (04/13/13 1734)  . TPN (CLINIMIX) Adult without lytes     Principal Problem:   Dehydration secondary to nausea and vomiting Active Problems:   Abdominal malignant neoplasm   Hypokalemia   Acute kidney injury secondary to dehydration  Total time: 35 min  Pamella Pert  [facility] Pager [phone]. If 7PM-7AM, please contact night-coverage at [email or web address], password [email or web address] 04/14/2013, 10:56 AM  LOS: 11 days

## 2013-04-15 LAB — URINALYSIS, MICROSCOPIC ONLY
Glucose, UA: NEGATIVE mg/dL
Ketones, ur: NEGATIVE mg/dL
Specific Gravity, Urine: 1.005 (ref 1.005–1.030)
pH: 5.5 (ref 5.0–8.0)

## 2013-04-15 LAB — BASIC METABOLIC PANEL
BUN: 42 mg/dL — ABNORMAL HIGH (ref 6–23)
Calcium: 8.6 mg/dL (ref 8.4–10.5)
Creatinine, Ser: 1.57 mg/dL — ABNORMAL HIGH (ref 0.50–1.35)
GFR calc Af Amer: 55 mL/min — ABNORMAL LOW (ref >90)
GFR calc non Af Amer: 48 mL/min — ABNORMAL LOW (ref >90)

## 2013-04-15 LAB — CBC
HCT: 20.2 % — ABNORMAL LOW (ref 39.0–52.0)
MCH: 31.9 pg (ref 26.0–34.0)
MCHC: 34.2 g/dL (ref 30.0–36.0)
MCV: 93.5 fL (ref 78.0–100.0)
RDW: 18.3 % — ABNORMAL HIGH (ref 11.5–15.5)

## 2013-04-15 LAB — GLUCOSE, CAPILLARY
Glucose-Capillary: 101 mg/dL — ABNORMAL HIGH (ref 70–99)
Glucose-Capillary: 120 mg/dL — ABNORMAL HIGH (ref 70–99)
Glucose-Capillary: 121 mg/dL — ABNORMAL HIGH (ref 70–99)
Glucose-Capillary: 96 mg/dL (ref 70–99)
Glucose-Capillary: 96 mg/dL (ref 70–99)

## 2013-04-15 LAB — PREPARE RBC (CROSSMATCH)

## 2013-04-15 MED ORDER — TRACE MINERALS CR-CU-F-FE-I-MN-MO-SE-ZN IV SOLN
INTRAVENOUS | Status: AC
  Administered 2013-04-15: 19:00:00 via INTRAVENOUS
  Filled 2013-04-15: qty 2400

## 2013-04-15 MED ORDER — MORPHINE SULFATE 2 MG/ML IJ SOLN
2.0000 mg | INTRAMUSCULAR | Status: DC | PRN
  Administered 2013-04-15 – 2013-04-16 (×4): 2 mg via INTRAVENOUS
  Filled 2013-04-15 (×4): qty 1

## 2013-04-15 MED ORDER — FAT EMULSION 20 % IV EMUL
250.0000 mL | INTRAVENOUS | Status: AC
  Administered 2013-04-15: 250 mL via INTRAVENOUS
  Filled 2013-04-15: qty 250

## 2013-04-15 MED ORDER — PHENAZOPYRIDINE HCL 100 MG PO TABS
100.0000 mg | ORAL_TABLET | Freq: Once | ORAL | Status: AC
  Administered 2013-04-15: 100 mg via ORAL
  Filled 2013-04-15: qty 1

## 2013-04-15 NOTE — Progress Notes (Signed)
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 [name] which     Action/Plan:   tbd will follow for dc needs.   Anticipated DC Date:  04/16/2013   Anticipated DC Plan:  LONG TERM ACUTE CARE (LTAC)  In-house referral  Clinical Social Worker      DC Planning Services  CM consult      [facility] Choice  NA   Choice offered to / List presented to:  NA   DME arranged  NA      DME agency  NA     HH arranged  NA      HH agency  NA   Status of service:  In process, will continue to follow Medicare Important Message given?  NA - LOS <3 / Initial given by admissions (If response is "NO", the following Medicare IM given date fields will be blank) Date Medicare IM given:   Date Additional Medicare IM given:    Discharge Disposition:    Per UR Regulation:  Reviewed for med. necessity/level of care/duration of stay  If discussed at Long Length of Stay Meetings, dates discussed:    Comments:  [id number]/Rhonda Davis,RN,BSN,CCM: spoke with representative for [facility], and the patient is aware of the co-pay and coverage.  [facility] will go ahead with the full consult. patient questioning if he will be moved today or tomorrow my response was it does depend on the bed availability and the finally decision is for the attending md to make has to when he is ready.  [id number]/[id number] Davis,RN,BSN,CCM:  patient requested conference with cm.  Wanted to know of what ltac facilities were in the [place] area.  Informed him of [facility] and [facility].  [facility] at [facility] but not sure if this is a via place for him to  go.  Will have both representative to see the patient.  [id number]/FAOZHY [place]ene Plater, RN, BSN, CCM: continuous renal replacement, transferred back to icu on [id number].  CHART REVIEWED AND UPDATED.  Next chart review due on [id number]. NO DISCHARGE NEEDS PRESENT AT THIS TIME. CASE MANAGEMENT [phone]   [id number]/KVQQVZ Earlene Plater, RN, BSN, CCM:  CHART REVIEWED AND UPDATED.  Next chart review due on [id number]. NO DISCHARGE NEEDS PRESENT AT THIS TIME. CASE MANAGEMENT [phone]   [id number]/WFUXNA Earlene Plater, RN, BSN, CCM:  CHART REVIEWED AND UPDATED.  Next chart review due on [id number]. NO DISCHARGE NEEDS PRESENT AT THIS TIME. CASE MANAGEMENT [phone]   [id number]/OHYWVP Earlene Plater, RN, BSN, CCM:  CHART REVIEWED AND UPDATED.  Next chart review due on [id number]. NO DISCHARGE NEEDS PRESENT AT THIS TIME. CASE MANAGEMENT [phone]

## 2013-04-15 NOTE — Progress Notes (Signed)
PT to see this pt on next visit to re-assess pt and update goals as needed.   Thanks,  Harriet Butte, PT [phone]

## 2013-04-15 NOTE — Progress Notes (Addendum)
I was notified by the patient states that the patient continues to have dysuria when urinating his urethra. Flomax and Pyridium have not helped. 1 mg of morphine did not help. The patient continues on Cipro for his Pseudomonas bacteriuria.  I spoke with Dr. Laverle Patter and consulted him.  He has agreed to see the patient.  Increased morphine to 2mg  q 3hours.  Repeat UA and urine culture tonight.  DTat

## 2013-04-15 NOTE — Progress Notes (Addendum)
[facility] PROGRESS NOTE  [name] RUE:[id number] DOB: 1956-01-29 DOA: 04/03/2013 PCP: [name], MD  Assessment/Plan: Acute renal failure:- Due to impressive GI losses that need to be matched 1:1. Nephrology followed patient, signed off 4/28.  - difficult to manage his fluid status  - have tried to use Octreotide to decrease gi losses, patient refused.  -readdressed octreotide 04/15/13--continued refusal--pt does not feels it helped in past Over the past 24 hours:  IN (ml): 13023 (TPN + po + 0.45% NaCl with 20 mEq K)  O (ml): 10275  Current drips: TPN 100 cc/h; 1/2 NS with 40 KCl 400 cc/h continuous (changed from 300 cc/h 4/29).  -Remains about -1.5 L since 04/10/2013 - Patient is a higher level of care for now we can achieve on the floor, so staying in step down unit.  - This is unfortunate as being on the floor was a step towards patient going to rehabilitation facility.  - He does not qualify for the LTAC based on his insurance yet, however if he spends 21 days in the hospital he will qualify.  - Patient continues to be a full code and wants to pursue aggressive chemotherapy at this point. Should he change his mind and want to talk to palliative again he will let me know. He was seen by palliative in January of 2014 and was discharged with hospice at that time.  - He appears very focused on obtaining chemotherapy for his cancer; Dr. Elvera Lennox had long conversations with the patient and the patient's brother  that he has significant multiple additional problems and chemotherapy may shrink the tumor further; however, it will not be able to fix ongoing severe GI losses, his deconditioning, the fact that he is not able to eat and relies on TPN , and overall health. I asked him if he considers that being in the hospital for the past 3 months represents quality of life that he'll like for himself, however he continues to avoid answering directly that question and has hopes that  chemotherapy would get him back on his feet.  " I feel great right now.  I'm not just going to roll over and die" -Continues to have insistence on chemo therapy--I informed patient regarding Dr. Charlean Sanfilippo conversation with Dr. Leavy Cella on 04/14/13-->Dr. Sharrell Ku impression was that he may not be a good chemotherapy candidate but if he improves he will consider it. Our best course of action at this point is to transfer patient to [facility] as soon as possible  Abdominal carcinomatosis - Treatment per his oncologist.  - patient asked if there is anyone else from oncology in the area that is willing to offer him chemo.  Pseudomonas Bacteruria - on Ciprofloxacin based on sensitivities  History of gout - presently not having any flares.  Neuropathic pain - persistent on Lyrica 50 TID,  increased to 75 TID.  Hypokalemia: replete as needed.  Hyper/hyponatremia - management as above.  Nutrition  -on TPN and clear liquid diet. G tube drain per shift.  DVT prophylaxis - lovenox Anemia of chronic disease -Check iron studies -Check RBC folate, B12 -Transfuse 1 unit PRBC 04/15/2013       Family Communication:   Pt at beside Disposition Plan:  LTAC after 21 day hospital stay    Antibiotics:  Ciprofloxacin 04/13/2013>>>  Zosyn 04/12/2013>>> 04/13/2013      Procedures/Studies: US Renal  04/06/2013  *RADIOLOGY REPORT*  Clinical Data: Renal insufficiency and history of peritoneal carcinomatosis.  RENAL/URINARY TRACT ULTRASOUND COMPLETE  Comparison:  CT of the abdomen on 12/16/2012.  Findings:  Right Kidney:  The right kidney measures approximately 12.3 cm in length.  Sonographic appearance is normal without evidence of hydronephrosis or focal lesion.  Left Kidney:  The left kidney measures approximately 13 cm in length and has a normal sonographic appearance without evidence of hydronephrosis.  Small cyst of the mid kidney measures 1.2 cm has a benign appearance.  Bladder:  The bladder was mildly  distended at the time imaging and unremarkable in appearance.  IMPRESSION: Unremarkable renal ultrasound.  No evidence of hydronephrosis bilaterally.   Original Report Authenticated By: Irish Lack, M.D.    Dg Chest Port 1 View  04/03/2013  *RADIOLOGY REPORT*  Clinical Data: Weakness and shortness of breath.  PORTABLE CHEST - 1 VIEW  Comparison: Chest radiograph performed 01/17/2013  Findings: The patient's right PICC is noted ending about the mid SVC.  The lungs are well-aerated and clear.  There is no evidence of focal opacification, pleural effusion or pneumothorax.  The cardiomediastinal silhouette is within normal limits.  No acute osseous abnormalities are seen.  IMPRESSION: No acute cardiopulmonary process seen.   Original Report Authenticated By: Tonia Ghent, M.D.          Subjective: Patient is very frustrated that he cannot get chemotherapy right now. Otherwise denies fevers, chills, chest pain, shortness breath, nausea, vomiting, abdominal pain, dizziness, headache.  Objective: Filed Vitals:   04/14/13 2354 04/15/13 0400 04/15/13 0800 04/15/13 0827  BP: 126/57   118/78  Pulse: 79     Temp: 98.1 F (36.7 C) 98.7 F (37.1 C) 98.8 F (37.1 C)   TempSrc: Oral Oral Oral   Resp: 20     Height:      Weight:      SpO2: 100%       Intake/Output Summary (Last 24 hours) at 04/15/13 1158 Last data filed at 04/15/13 0600  Gross per 24 hour  Intake  47829 ml  Output   9725 ml  Net    405 ml   Weight change:  Exam:   General:  Pt is alert, follows commands appropriately, not in acute distress  HEENT: No icterus, No thrush,  [place]/AT  Cardiovascular: RRR, S1/S2, no rubs, no gallops  Respiratory: CTA bilaterally, no wheezing, no crackles, no rhonchi  Abdomen: Soft/+BS, non tender, non distended, no guarding; multiple drains  Extremities: trace edema, No lymphangitis, No petechiae, No rashes, no synovitis  Data Reviewed: Basic Metabolic Panel:  Recent Labs Lab  04/10/13 0430 04/11/13 0437  04/12/13 0402  04/13/13 0400 04/13/13 1840 04/14/13 0500 04/14/13 1600 04/15/13 0420  NA 143 148*  < > 140  < > 138 140 137 137 138  K 4.1 3.5  < > 4.1  < > 4.2 4.7 4.5 4.5 4.4  CL 116* 115*  < > 113*  < > 108 109 107 109 110  CO2 21 23  < > 21  < > 22 22 21 20 21   GLUCOSE 130* 151*  < > 112*  < > 103* 104* 107* 99 96  BUN 30* 30*  < > 33*  < > 40* 40* 42* 41* 42*  CREATININE 1.34 1.29  < > 1.26  < > 1.47* 1.52* 1.62* 1.53* 1.57*  CALCIUM 8.4 9.0  < > 8.6  < > 8.7 9.3 8.8 8.6 8.6  MG 1.5 1.6  --  1.8  --  1.6  --  1.7  --   --  PHOS 4.9* 6.5*  --  4.8*  --  5.5*  --  5.8*  --   --   < > = values in this interval not displayed. Liver Function Tests:  Recent Labs Lab 04/09/13 0345 04/13/13 0400 04/14/13 0500  AST 28 56* 23  ALT 25 48 37  ALKPHOS 192* 274* 264*  BILITOT 0.5 0.7 0.4  PROT 4.5* 5.6* 5.7*  ALBUMIN 1.5* 1.7* 1.7*   No results found for this basename: LIPASE, AMYLASE,  in the last 168 hours No results found for this basename: AMMONIA,  in the last 168 hours CBC:  Recent Labs Lab 04/11/13 0437 04/12/13 0402 04/12/13 1800 04/13/13 0400 04/15/13 0420  WBC 3.3* 2.7* 3.4* 3.1* 2.8*  NEUTROABS  --   --   --  1.7  --   HGB 7.7* 7.5* 8.1* 7.5* 6.9*  HCT 22.4* 21.7* 23.2* 21.9* 20.2*  MCV 93.3 93.5 93.5 94.0 93.5  PLT 208 190 219 210 197   Cardiac Enzymes: No results found for this basename: CKTOTAL, CKMB, CKMBINDEX, TROPONINI,  in the last 168 hours BNP: No components found with this basename: POCBNP,  CBG:  Recent Labs Lab 04/14/13 1538 04/14/13 1929 04/14/13 2349 04/15/13 0423 04/15/13 0821  GLUCAP 91 83 96 96 97    Recent Results (from the past 240 hour(s))  URINE CULTURE     Status: None   Collection Time    04/11/13  4:55 AM      Result Value Range Status   Specimen Description URINE, CLEAN CATCH   Final   Special Requests NONE   Final   Culture  Setup Time 04/11/2013 11:57   Final   Colony Count >=100,000  COLONIES/ML   Final   Culture PSEUDOMONAS AERUGINOSA   Final   Report Status 04/13/2013 FINAL   Final   Organism ID, Bacteria PSEUDOMONAS AERUGINOSA   Final     Scheduled Meds: . antiseptic oral rinse  15 mL Mouth Rinse q12n4p  . chlorhexidine  15 mL Mouth Rinse BID  . ciprofloxacin  400 mg Intravenous Q12H  . clindamycin  1 application Topical BID  . enoxaparin (LOVENOX) injection  40 mg Subcutaneous Q24H  . fludrocortisone  0.1 mg Oral BID  . insulin aspart  0-15 Units Subcutaneous Q4H  . midodrine  10 mg Oral TID  . pantoprazole  40 mg Oral q morning - 10a  . pregabalin  75 mg Oral TID  . sodium chloride  3 mL Intravenous Q12H  . tamsulosin  0.4 mg Oral Daily  . vitamin C  500 mg Oral q morning - 10a  . zinc sulfate  220 mg Oral q morning - 10a   Continuous Infusions: . 0.45 % NaCl with KCl 20 mEq / L 1,000 mL (04/15/13 0939)  . TPN (CLINIMIX) Adult without lytes 100 mL/hr at 04/14/13 1833  . TPN [facility]) Adult without lytes       Leo Fray, DO  [facility] Pager [phone]  If 7PM-7AM, please contact night-coverage [email or web address] Password [email or web address] 04/15/2013, 11:58 AM   LOS: 12 days

## 2013-04-15 NOTE — Progress Notes (Signed)
PARENTERAL NUTRITION CONSULT NOTE - FOLLOW UP  Pharmacy Consult for TPN Indication: enterocutaneous fistula, abdominal carcinomatosis  Allergies  Allergen Reactions  . Percocet (Oxycodone-Acetaminophen) Nausea And Vomiting    hallucination   Patient Measurements: Height: 6\' 4"  (193 cm) Weight: 184 lb 1.4 oz (83.5 kg) IBW/kg (Calculated) : 86.8 Usual Weight: 170-180 lbs  Vital Signs: Temp: 98.7 F (37.1 C) (04/30 0400) Temp src: Oral (04/30 0400) BP: 126/57 mmHg (04/29 2354) Pulse Rate: 79 (04/29 2354) Intake/Output from previous day: 04/29 0701 - 04/30 0700 In: 13023.3 [P.O.:600; I.V.:8613.3; IV Piggyback:1400; TPN:2410] Out: 45409 [Urine:6050; Drains:4100; Stool:125]  Labs:  Recent Labs  04/12/13 1800 04/13/13 0400 04/15/13 0420  WBC 3.4* 3.1* 2.8*  HGB 8.1* 7.5* 6.9*  HCT 23.2* 21.9* 20.2*  PLT 219 210 197    Recent Labs  04/13/13 0400  04/14/13 0500 04/14/13 1600 04/15/13 0420  NA 138  < > 137 137 138  K 4.2  < > 4.5 4.5 4.4  CL 108  < > 107 109 110  CO2 22  < > 21 20 21   GLUCOSE 103*  < > 107* 99 96  BUN 40*  < > 42* 41* 42*  CREATININE 1.47*  < > 1.62* 1.53* 1.57*  CALCIUM 8.7  < > 8.8 8.6 8.6  MG 1.6  --  1.7  --   --   PHOS 5.5*  --  5.8*  --   --   PROT 5.6*  --  5.7*  --   --   ALBUMIN 1.7*  --  1.7*  --   --   AST 56*  --  23  --   --   ALT 48  --  37  --   --   ALKPHOS 274*  --  264*  --   --   BILITOT 0.7  --  0.4  --   --   PREALBUMIN 18.6  --   --   --   --   TRIG 164*  --   --   --   --   CHOL 108  --   --   --   --   < > = values in this interval not displayed. Estimated Creatinine Clearance: 62 ml/min (by C-G formula based on Cr of 1.57).    Recent Labs  04/14/13 1929 04/14/13 2349 04/15/13 0423  GLUCAP 83 96 96    Insulin Requirements in the past 24 hours:  CBGs < 150, NO moderate SSI required/24h (Regular insulin removed from TPN 4/23)  IVF per Nephrology: 1/2NS with 20KCl at 435ml/hr  Current Nutrition:  Clinimix  5/15 at 100 ml/hr + lipids at 10 ml/hr MWF. Full liquid diet starting 4/25 (PO intake / 24h)  Nutritional Goals:   RD recs (4/18): Kcal: 2220-2565 Protein: 100-132 grams Fluid: 2.3-2.6 L   Clinimix E5/20 at a goal rate of 100 ml/hr + IVFE 20% at 10 ml/hr on MWF to provide: 120g/day protein, 2318 Kcal/day avg  Clinimix 5/15 without lytes @ 100 ml/hr + IVFE 20% at 10 ml/hr on MWF will provide 120 g/day protein, average of 1910 Kcal/day   Assessment:  34 yoM with complicated PMH of metastatic adenocarcinoma, peritoneal carcinomatosis s/p extensive debulking procedures and abdominal surgeries which has left him with multiple drains and fistulas on chronic TPN via a PICC line. Discharged from [facility] recently and was transferred to his nursing home in [place] started developing N/V and weakness. Was receiving TPN cycled over 18 hours  at [facility]. Will continue TPN inpatient but will be difficult to provide large protein and kcal amounts with premixed Clinimix TNAs.  RD evaluated the patient 4/18 and recommended a reduced protein goal than PTA.  Pharmacy will be able to meet new nutrition goals with the premixed Clinimix product.  Will provide TNA continuously inpatient due to high volume that needs to be administered with premixed product.  Labs:  CBGs: at goal < 150mg /dl  Electrolytes: K and Na normalized; elevated Phos (5.8 on 4/29); Corr Ca = 10.6  LFTs: AST/ALT wnl (4/29)  TGs: 195 (4/19), 177 (4/21), 164 (4/28)  Prealbumin: 33 (4/19), 28.2 (4/21), 18.6 (4/28)  Renal: ARF from hypotension/volume depletion; SCr rising again. I/O net +2.7L /24h, UOP = 3 ml/kg/hr, drains= 4.1 L   Plan:  At 1800 today:  Continue TNA without electrolytes given rising phosphorus.  The only available formulation is Clinimix 5/15, continue 142ml/hr.  Continue no insulin in TPN, particularly with decrease dextrose formulation.  Fat emulsion at 10 ml/hr (MWF only due to ongoing shortage).  TNA  to contain standard multivitamins and trace elements (MWF only due to ongoing shortage). Will continue to use IV as unsure of enteral absorption given massive GI losses. Also vitamin C and zinc continued from home. TNA lab panels on Mondays & Thursdays.    Lynann Beaver PharmD, BCPS Pager [phone] 04/15/2013 8:04 AM

## 2013-04-16 LAB — TYPE AND SCREEN: ABO/RH(D): B POS

## 2013-04-16 LAB — IRON AND TIBC
Iron: 102 ug/dL (ref 42–135)
Saturation Ratios: 61 % — ABNORMAL HIGH (ref 20–55)
TIBC: 167 ug/dL — ABNORMAL LOW (ref 215–435)

## 2013-04-16 LAB — PHOSPHORUS: Phosphorus: 5.6 mg/dL — ABNORMAL HIGH (ref 2.3–4.6)

## 2013-04-16 LAB — GLUCOSE, CAPILLARY
Glucose-Capillary: 133 mg/dL — ABNORMAL HIGH (ref 70–99)
Glucose-Capillary: 98 mg/dL (ref 70–99)

## 2013-04-16 LAB — COMPREHENSIVE METABOLIC PANEL
AST: 30 U/L (ref 0–37)
Albumin: 1.7 g/dL — ABNORMAL LOW (ref 3.5–5.2)
Calcium: 8.9 mg/dL (ref 8.4–10.5)
Creatinine, Ser: 1.69 mg/dL — ABNORMAL HIGH (ref 0.50–1.35)
GFR calc non Af Amer: 44 mL/min — ABNORMAL LOW (ref >90)

## 2013-04-16 LAB — CBC
HCT: 23.2 % — ABNORMAL LOW (ref 39.0–52.0)
RDW: 18.3 % — ABNORMAL HIGH (ref 11.5–15.5)
WBC: 3.3 10*3/uL — ABNORMAL LOW (ref 4.0–10.5)

## 2013-04-16 LAB — VITAMIN B12: Vitamin B-12: 804 pg/mL (ref 211–911)

## 2013-04-16 LAB — URINE CULTURE

## 2013-04-16 MED ORDER — PHENAZOPYRIDINE HCL 100 MG PO TABS
100.0000 mg | ORAL_TABLET | Freq: Once | ORAL | Status: AC
  Administered 2013-04-16: 100 mg via ORAL
  Filled 2013-04-16: qty 1

## 2013-04-16 MED ORDER — MAGNESIUM SULFATE 40 MG/ML IJ SOLN
4.0000 g | Freq: Once | INTRAMUSCULAR | Status: AC
  Administered 2013-04-16: 4 g via INTRAVENOUS
  Filled 2013-04-16: qty 100

## 2013-04-16 MED ORDER — SODIUM CHLORIDE 0.9 % IV SOLN
INTRAVENOUS | Status: DC
  Administered 2013-04-16 – 2013-04-17 (×9): via INTRAVENOUS
  Filled 2013-04-16 (×23): qty 1000

## 2013-04-16 MED ORDER — CLINIMIX/DEXTROSE (5/15) 5 % IV SOLN
INTRAVENOUS | Status: DC
  Administered 2013-04-16: 17:00:00 via INTRAVENOUS
  Filled 2013-04-16: qty 2400

## 2013-04-16 NOTE — Progress Notes (Signed)
PARENTERAL NUTRITION CONSULT NOTE - FOLLOW UP  Pharmacy Consult for TPN Indication: enterocutaneous fistula, abdominal carcinomatosis  Allergies  Allergen Reactions  . Percocet (Oxycodone-Acetaminophen) Nausea And Vomiting    hallucination   Patient Measurements: Height: 6\' 4"  (193 cm) Weight: 184 lb 1.4 oz (83.5 kg) IBW/kg (Calculated) : 86.8 Usual Weight: 170-180 lbs  Vital Signs: Temp: 97.5 F (36.4 C) (05/01 0800) Temp src: Oral (05/01 0800) BP: 154/96 mmHg (05/01 0622) Pulse Rate: 77 (05/01 0622) Intake/Output from previous day: 04/30 0701 - 05/01 0700 In: 12612.5 [P.O.:240; I.V.:9080; Blood:352.5; IV Piggyback:300; TPN:2640] Out: 5400 [Urine:3525; Drains:1675; Stool:200]  Labs:  Recent Labs  04/15/13 0420 04/16/13 0500  WBC 2.8* 3.3*  HGB 6.9* 8.0*  HCT 20.2* 23.2*  PLT 197 204    Recent Labs  04/14/13 0500 04/14/13 1600 04/15/13 0420 04/16/13 0500  NA 137 137 138 139  K 4.5 4.5 4.4 4.8  CL 107 109 110 111  CO2 21 20 21 19   GLUCOSE 107* 99 96 90  BUN 42* 41* 42* 46*  CREATININE 1.62* 1.53* 1.57* 1.69*  CALCIUM 8.8 8.6 8.6 8.9  MG 1.7  --   --  1.1*  PHOS 5.8*  --   --  5.6*  PROT 5.7*  --   --  5.6*  ALBUMIN 1.7*  --   --  1.7*  AST 23  --   --  30  ALT 37  --   --  38  ALKPHOS 264*  --   --  376*  BILITOT 0.4  --   --  0.6   Estimated Creatinine Clearance: 57.6 ml/min (by C-G formula based on Cr of 1.69).    Recent Labs  04/15/13 2354 04/16/13 0355 04/16/13 0741  GLUCAP 133* 98 90    Insulin Requirements in the past 24 hours:  CBGs < 150, NO moderate SSI required/24h (Regular insulin removed from TPN 4/23)  IVF per Nephrology: 1/2NS with 20KCl at 430ml/hr  Current Nutrition:  Clinimix 5/15 at 100 ml/hr + lipids at 10 ml/hr MWF. Full liquid diet starting 4/25 (PO intake / 24h)  Nutritional Goals:   RD recs (4/18): Kcal: 2220-2565 Protein: 100-132 grams Fluid: 2.3-2.6 L   Clinimix E5/20 at a goal rate of 100 ml/hr +  IVFE 20% at 10 ml/hr on MWF to provide: 120g/day protein, 2318 Kcal/day avg  Clinimix 5/15 without lytes @ 100 ml/hr + IVFE 20% at 10 ml/hr on MWF will provide 120 g/day protein, average of 1910 Kcal/day   Assessment:  53 yoM with complicated PMH of metastatic adenocarcinoma, peritoneal carcinomatosis s/p extensive debulking procedures and abdominal surgeries which has left him with multiple drains and fistulas on chronic TPN via a PICC line. Discharged from [facility] recently and was transferred to his nursing home in [place] started developing N/V and weakness. Was receiving TPN cycled over 18 hours at [facility]. Will continue TPN inpatient but will be difficult to provide large protein and kcal amounts with premixed Clinimix TNAs.  RD evaluated the patient 4/18 and recommended a reduced protein goal than PTA.  Pharmacy will be able to meet new nutrition goals with the premixed Clinimix product.  Will provide TNA continuously inpatient due to high volume that needs to be administered with premixed product.  Labs:  CBGs: at goal < 150mg /dl  Electrolytes: K and Na normalized; continued elevated Phos (5.6); Corr Ca = 10.7; Mag low at 1.1  LFTs: AST/ALT wnl (5/1), alkPhos rising some (264 to 376)  TGs:  195 (4/19), 177 (4/21), 164 (4/28)  Prealbumin: 33 (4/19), 28.2 (4/21), 18.6 (4/28)  Renal: ARF from hypotension/volume depletion; SCr rising again. I/O net +6.7L /24h, UOP = 1.8 ml/kg/hr, drains= 1.7 L, stool =   Plan:  At 1800 today:  Continue TNA without electrolytes given elevated phosphorus and normal lytes (except mag).  The only available formulation is Clinimix 5/15, continue 189ml/hr.  Replace magnesium - 4g IV today Continue no insulin in TPN, particularly with decrease dextrose formulation.  Fat emulsion at 10 ml/hr (MWF only due to ongoing shortage).  TNA to contain standard multivitamins and trace elements (MWF only due to ongoing shortage). Will continue to use IV  as unsure of enteral absorption given massive GI losses. Also vitamin C and zinc continued from home. TNA lab panels on Mondays & Thursdays.     Hessie Knows, PharmD, BCPS Pager [phone] 04/16/2013 10:07 AM

## 2013-04-16 NOTE — Plan of Care (Signed)
Problem: Phase II Progression Outcomes Goal: IV changed to normal saline lock Outcome: Not Progressing Still requires ivf at 400 cc/hr

## 2013-04-16 NOTE — Clinical Social Work Note (Signed)
CSW had been following for return to SNF at d/c. RN CM is working on [employer] placement. CSW will continue to follow in case LTAC falls through.   Doreen Salvage, LCSW ICU/Stepdown Clinical Social Worker [facility] Cell [phone] Hours 8am-1200pm M-F

## 2013-04-16 NOTE — Progress Notes (Signed)
NUTRITION FOLLOW UP  Intervention:   TPN per PharmD: Pt currently receiving Clinimix 5/15 without lytes @ 100 ml/hr + IVFE 20% at 10 ml/hr on MWF will provide 120 g/day protein, average of 1910 Kcal/day  TPN currently meeting 83% of estimated calorie needs and 100% of estimated protein needs.  Nutrition Dx:   Inadequate oral intake related to inability to eat as evidenced by pt with chronic TPN and chronic PEG for suction; ongoing   Goal:   Pt to meet >/= 90% of their estimated nutrition needs; not currently met   Monitor:   Weight; now 14 lbs greater than admission weight 4/18 TPN Labs; low magnesium, high phosphorus, low hemoglobin  Assessment:   Pt reports that he is feeling well and is now taking some full liquids by mouth but, they continue to be suctioned out. Pt states G-tube suctioning is stopped for medications and he can tolerated no suction for about 5-6 hours at which point he usually feels nauseous and vomits. Pt states he would like to try to discontinue suctioning for longer while on full liquids PO. Pt states he is satisfied with TNA and rarely feels hungry.  PharmD managing TNA and currently providing lower calories due to pt needing TNA without electrolytes.  Goal is Clinimix E5/20 at a goal rate of 100 ml/hr + IVFE 20% at 10 ml/hr on MWF to provide: 120g/day protein, 2318 Kcal/day avg which will meet 100% of estimated needs   Height: Ht Readings from Last 1 Encounters:  04/13/13 6\' 4"  (1.93 m)    Weight Status:   Wt Readings from Last 1 Encounters:  04/16/13 184 lb 1.4 oz (83.5 kg)    Re-estimated needs:  Kcal: 2290-2750  Protein: 120-156 grams  Fluid: 2.3-2.6 L  Skin: +1 generalized edema, non-pitting RLE and LLE edema, non-pitting perineal ; pin head sized wound on buttocks  Diet Order: Full Liquid   Intake/Output Summary (Last 24 hours) at 04/16/13 1259 Last data filed at 04/16/13 0700  Gross per 24 hour  Intake 9412.5 ml  Output   4100 ml  Net  5312.5 ml    Last BM: 4/29   Labs:   Recent Labs Lab 04/13/13 0400  04/14/13 0500 04/14/13 1600 04/15/13 0420 04/16/13 0500  NA 138  < > 137 137 138 139  K 4.2  < > 4.5 4.5 4.4 4.8  CL 108  < > 107 109 110 111  CO2 22  < > 21 20 21 19   BUN 40*  < > 42* 41* 42* 46*  CREATININE 1.47*  < > 1.62* 1.53* 1.57* 1.69*  CALCIUM 8.7  < > 8.8 8.6 8.6 8.9  MG 1.6  --  1.7  --   --  1.1*  PHOS 5.5*  --  5.8*  --   --  5.6*  GLUCOSE 103*  < > 107* 99 96 90  < > = values in this interval not displayed.  CBG (last 3)   Recent Labs  04/16/13 0355 04/16/13 0741 04/16/13 1111  GLUCAP 98 90 88    Scheduled Meds: . antiseptic oral rinse  15 mL Mouth Rinse q12n4p  . chlorhexidine  15 mL Mouth Rinse BID  . ciprofloxacin  400 mg Intravenous Q12H  . clindamycin  1 application Topical BID  . enoxaparin (LOVENOX) injection  40 mg Subcutaneous Q24H  . fludrocortisone  0.1 mg Oral BID  . insulin aspart  0-15 Units Subcutaneous Q4H  . magnesium sulfate 1 - 4 g  bolus IVPB  4 g Intravenous Once  . midodrine  10 mg Oral TID  . pantoprazole  40 mg Oral q morning - 10a  . pregabalin  75 mg Oral TID  . sodium chloride  3 mL Intravenous Q12H  . tamsulosin  0.4 mg Oral Daily  . vitamin C  500 mg Oral q morning - 10a  . zinc sulfate  220 mg Oral q morning - 10a    Continuous Infusions: . fat emulsion 250 mL (04/15/13 1849)  . sodium chloride 0.9 % 1,000 mL with potassium chloride 10 mEq infusion    . TPN (CLINIMIX) Adult without lytes 100 mL/hr at 04/15/13 1849  . TPN [facility]) Adult without lytes      Ian Malkin RD, LDN Inpatient Clinical Dietitian Pager: [phone] After Hours Pager: [phone]

## 2013-04-16 NOTE — Progress Notes (Signed)
[id number] Earlene Plater, RN, BSN, CCM:  CHART REVIEWED AND UPDATED.  Next chart review due on [id number].  PATIENT IS BEING REVIEWED FROM PLACEMENT AT [facility]. NO DISCHARGE NEEDS PRESENT AT THIS TIME. CASE MANAGEMENT [phone]

## 2013-04-16 NOTE — Progress Notes (Signed)
[facility] PROGRESS NOTE  [name] ZOX:[id number] DOB: 1956/02/04 DOA: 04/03/2013 PCP: [name], MD  Assessment/Plan: Acute renal failure:- Due to impressive GI losses that need to be matched 1:1. Nephrology followed patient, signed off 4/28.  - difficult to manage his fluid status  - have tried to use Octreotide to decrease gi losses, patient refused.  -readdressed octreotide 04/15/13--continued refusal--pt does not feels it helped in past  Over the past 24 hours:  IN (ml): 12612 (TPN + po + 0.45% NaCl with 20 mEq K)  O (ml): 5400  Current drips: TPN 100 cc/h; 1/2 NS with 40 KCl 400 cc/h continuous (changed from 300 cc/h 4/29).  -Patient is approximately +5.5 L since 04/10/2013  - Patient is a higher level of care for now we can achieve on the floor, so staying in step down unit.  - This is unfortunate as being on the floor was a step towards patient going to rehabilitation facility.  - He does not qualify for the LTAC based on his insurance yet, however if he spends 21 days in the hospital he will qualify.  -pt evaluated by [facility]--awaiting bed availability and insurance approval - Patient continues to be a full code and wants to pursue aggressive chemotherapy at this point. Should he change his mind and want to talk to palliative again he will let me know. He was seen by palliative in January of 2014 and was discharged with hospice at that time.  - He appears very focused on obtaining chemotherapy for his cancer; Dr. Elvera Lennox had long conversations with the patient and the patient's brother that he has significant multiple additional problems and chemotherapy may shrink the tumor further; however, it will not be able to fix ongoing severe GI losses, his deconditioning, the fact that he is not able to eat and relies on TPN , and overall health. I asked him if he considers that being in the hospital for the past 3 months represents quality of life that he'll like for  himself, however he continues to avoid answering directly that question and has hopes that chemotherapy would get him back on his feet. " I feel great right now. I'm not just going to roll over and die"  -Continues to have insistence on chemo therapy--I informed patient regarding Dr. Charlean Sanfilippo conversation with Dr. Leavy Cella on 04/14/13-->Dr. Sharrell Ku impression was that he may not be a good chemotherapy candidate but if he improves he will consider it. Our best course of action at this point is to transfer patient to [facility] as soon as possible  Abdominal carcinomatosis - Treatment per his oncologist.  - patient asked if there is anyone else from oncology in the area that is willing to offer him chemo.  Pseudomonas Bacteruria - on Ciprofloxacin based on sensitivities  History of gout - presently not having any flares.  Neuropathic pain - persistent on Lyrica 50 TID, increased to 75 TID.  Hypokalemia: replete as needed.  -Potassium is trending up -Decrease potassium in IV fluids Hyper/hyponatremia - management as above.  Nutrition  -on TPN and clear liquid diet. G tube drain per shift.  -Discontinue Accu-Cheks DVT prophylaxis - lovenox  Anemia of chronic disease  -Check iron studies--iron saturation 61% -RBC folate pending, B12--804 -Transfuse 1 unit PRBC 04/15/2013   Family Communication:   Pt at beside Disposition Plan:   LTAC when bed available    Antibiotics:  Ciprofloxacin 04/13/2013>>>       Procedures/Studies: US Renal  04/06/2013  *RADIOLOGY  REPORT*  Clinical Data: Renal insufficiency and history of peritoneal carcinomatosis.  RENAL/URINARY TRACT ULTRASOUND COMPLETE  Comparison:  CT of the abdomen on 12/16/2012.  Findings:  Right Kidney:  The right kidney measures approximately 12.3 cm in length.  Sonographic appearance is normal without evidence of hydronephrosis or focal lesion.  Left Kidney:  The left kidney measures approximately 13 cm in length and has a normal sonographic  appearance without evidence of hydronephrosis.  Small cyst of the mid kidney measures 1.2 cm has a benign appearance.  Bladder:  The bladder was mildly distended at the time imaging and unremarkable in appearance.  IMPRESSION: Unremarkable renal ultrasound.  No evidence of hydronephrosis bilaterally.   Original Report Authenticated By: Irish Lack, M.D.    Dg Chest Port 1 View  04/03/2013  *RADIOLOGY REPORT*  Clinical Data: Weakness and shortness of breath.  PORTABLE CHEST - 1 VIEW  Comparison: Chest radiograph performed 01/17/2013  Findings: The patient's right PICC is noted ending about the mid SVC.  The lungs are well-aerated and clear.  There is no evidence of focal opacification, pleural effusion or pneumothorax.  The cardiomediastinal silhouette is within normal limits.  No acute osseous abnormalities are seen.  IMPRESSION: No acute cardiopulmonary process seen.   Original Report Authenticated By: Tonia Ghent, M.D.          Subjective: Patient states that dysuria is better this morning. He feels like that this may have been due today one time dose of Pyridium. Denies any fevers, chills, chest pain, shortness of breath, nausea, vomiting, diarrhea, abdominal pain.  Objective: Filed Vitals:   04/16/13 0622 04/16/13 0800 04/16/13 1015 04/16/13 1200  BP: 154/96  134/93   Pulse: 77  86   Temp:  97.5 F (36.4 C)  97.5 F (36.4 C)  TempSrc:  Oral  Oral  Resp: 21  19   Height:      Weight:      SpO2: 100%  100%     Intake/Output Summary (Last 24 hours) at 04/16/13 1323 Last data filed at 04/16/13 0700  Gross per 24 hour  Intake 8912.5 ml  Output   3900 ml  Net 5012.5 ml   Weight change:  Exam:   General:  Pt is alert, follows commands appropriately, not in acute distress  HEENT: No icterus, No thrush, /AT  Cardiovascular: RRR, S1/S2, no rubs, no gallops  Respiratory: CTA bilaterally, no wheezing, no crackles, no rhonchi  Abdomen: Soft/+BS, non tender, non  distended, no guarding  Extremities: No edema, No lymphangitis, No petechiae, No rashes, no synovitis  Data Reviewed: Basic Metabolic Panel:  Recent Labs Lab 04/11/13 0437  04/12/13 0402  04/13/13 0400 04/13/13 1840 04/14/13 0500 04/14/13 1600 04/15/13 0420 04/16/13 0500  NA 148*  < > 140  < > 138 140 137 137 138 139  K 3.5  < > 4.1  < > 4.2 4.7 4.5 4.5 4.4 4.8  CL 115*  < > 113*  < > 108 109 107 109 110 111  CO2 23  < > 21  < > 22 22 21 20 21 19   GLUCOSE 151*  < > 112*  < > 103* 104* 107* 99 96 90  BUN 30*  < > 33*  < > 40* 40* 42* 41* 42* 46*  CREATININE 1.29  < > 1.26  < > 1.47* 1.52* 1.62* 1.53* 1.57* 1.69*  CALCIUM 9.0  < > 8.6  < > 8.7 9.3 8.8 8.6 8.6 8.9  MG 1.6  --  1.8  --  1.6  --  1.7  --   --  1.1*  PHOS 6.5*  --  4.8*  --  5.5*  --  5.8*  --   --  5.6*  < > = values in this interval not displayed. Liver Function Tests:  Recent Labs Lab 04/13/13 0400 04/14/13 0500 04/16/13 0500  AST 56* 23 30  ALT 48 37 38  ALKPHOS 274* 264* 376*  BILITOT 0.7 0.4 0.6  PROT 5.6* 5.7* 5.6*  ALBUMIN 1.7* 1.7* 1.7*   No results found for this basename: LIPASE, AMYLASE,  in the last 168 hours No results found for this basename: AMMONIA,  in the last 168 hours CBC:  Recent Labs Lab 04/12/13 0402 04/12/13 1800 04/13/13 0400 04/15/13 0420 04/16/13 0500  WBC 2.7* 3.4* 3.1* 2.8* 3.3*  NEUTROABS  --   --  1.7  --   --   HGB 7.5* 8.1* 7.5* 6.9* 8.0*  HCT 21.7* 23.2* 21.9* 20.2* 23.2*  MCV 93.5 93.5 94.0 93.5 92.8  PLT 190 219 210 197 204   Cardiac Enzymes: No results found for this basename: CKTOTAL, CKMB, CKMBINDEX, TROPONINI,  in the last 168 hours BNP: No components found with this basename: POCBNP,  CBG:  Recent Labs Lab 04/15/13 2002 04/15/13 2354 04/16/13 0355 04/16/13 0741 04/16/13 1111  GLUCAP 101* 133* 98 90 88    Recent Results (from the past 240 hour(s))  URINE CULTURE     Status: None   Collection Time    04/11/13  4:55 AM      Result Value  Range Status   Specimen Description URINE, CLEAN CATCH   Final   Special Requests NONE   Final   Culture  Setup Time 04/11/2013 11:57   Final   Colony Count >=100,000 COLONIES/ML   Final   Culture PSEUDOMONAS AERUGINOSA   Final   Report Status 04/13/2013 FINAL   Final   Organism ID, Bacteria PSEUDOMONAS AERUGINOSA   Final     Scheduled Meds: . antiseptic oral rinse  15 mL Mouth Rinse q12n4p  . chlorhexidine  15 mL Mouth Rinse BID  . ciprofloxacin  400 mg Intravenous Q12H  . clindamycin  1 application Topical BID  . enoxaparin (LOVENOX) injection  40 mg Subcutaneous Q24H  . fludrocortisone  0.1 mg Oral BID  . magnesium sulfate 1 - 4 g bolus IVPB  4 g Intravenous Once  . midodrine  10 mg Oral TID  . pantoprazole  40 mg Oral q morning - 10a  . pregabalin  75 mg Oral TID  . sodium chloride  3 mL Intravenous Q12H  . tamsulosin  0.4 mg Oral Daily  . vitamin C  500 mg Oral q morning - 10a  . zinc sulfate  220 mg Oral q morning - 10a   Continuous Infusions: . fat emulsion 250 mL (04/15/13 1849)  . sodium chloride 0.9 % 1,000 mL with potassium chloride 10 mEq infusion    . TPN (CLINIMIX) Adult without lytes 100 mL/hr at 04/15/13 1849  . TPN [facility]) Adult without lytes       Kayelee Herbig, DO  [facility] Pager [phone]  If 7PM-7AM, please contact night-coverage [email or web address] Password [email or web address] 04/16/2013, 1:23 PM   LOS: 13 days

## 2013-04-16 NOTE — Progress Notes (Signed)
Physical Therapy Treatment Patient Details Name: [name] MRN: [id number] DOB: 10-20-1956 Today's Date: 04/16/2013 Time: 4098-1191 PT Time Calculation (min): 26 min  PT Assessment / Plan / Recommendation Comments on Treatment Session  Pt making good progress today and was able to ambulate 43' x 2 reps.  HR up to 126.  RN aware.     Follow Up Recommendations  SNF;Supervision/Assistance - 24 hour;LTACH     Does the patient have the potential to tolerate intense rehabilitation     Barriers to Discharge        Equipment Recommendations  None recommended by PT    Recommendations for Other Services    Frequency Min 3X/week   Plan Discharge plan needs to be updated    Precautions / Restrictions Precautions Precautions: Fall Precaution Comments: multiple lines/drains Restrictions Weight Bearing Restrictions: No   Pertinent Vitals/Pain Pt c/o pain from UTI with urge to urinate, however unable.     Mobility  Bed Mobility Bed Mobility: Supine to Sit Supine to Sit: 5: Supervision Details for Bed Mobility Assistance: Supervision for safety with min cues for safety and technique.  Transfers Transfers: Sit to Stand;Stand to Sit Sit to Stand: 1: +2 Total assist;From bed;From elevated surface Sit to Stand: Patient Percentage: 80% Stand to Sit: 4: Min guard;With upper extremity assist;With armrests;To chair/3-in-1 Details for Transfer Assistance: Performed x 2 in order to allow rest break in between ambulation.  HR up to 126 during amb.  Some SOB noted, however pt states that its not as bad as it has been.  Ambulation/Gait Ambulation/Gait Assistance: 1: +2 Total assist Ambulation/Gait: Patient Percentage: 80% Ambulation Distance (Feet): 43 Feet (x2) Assistive device: Rolling walker Ambulation/Gait Assistance Details: Cues for upright posture and not stepping too far inside of RW with amb.   Gait Pattern: Step-through pattern General Gait Details: Continues to have unsteady  gait pattern  Stairs: No    Exercises     PT Diagnosis:    PT Problem List:   PT Treatment Interventions:     PT Goals Acute Rehab PT Goals PT Goal Formulation: With patient Time For Goal Achievement: 04/30/13 Potential to Achieve Goals: Good Pt will go Sit to Stand: with min assist PT Goal: Sit to Stand - Progress: Progressing toward goal Pt will go Stand to Sit: with supervision PT Goal: Stand to Sit - Progress: Updated due to goals met Pt will Ambulate: 51 - 150 feet;with min assist;with least restrictive assistive device PT Goal: Ambulate - Progress: Updated due to goal met  Visit Information  Last PT Received On: 04/16/13 Assistance Needed: +2    Subjective Data  Subjective: I still don't feel right because of this UTI, but I want to walk.  Patient Stated Goal: home   Cognition  Cognition Arousal/Alertness: Awake/alert Behavior During Therapy: Impulsive Overall Cognitive Status: No family/caregiver present to determine baseline cognitive functioning    Balance     End of Session PT - End of Session Equipment Utilized During Treatment: Gait belt Activity Tolerance: Patient limited by fatigue Patient left: in chair;with call bell/phone within reach;with nursing in room Nurse Communication: Mobility status   GP     Vista Deck 04/16/2013, 2:45 PM

## 2013-04-16 NOTE — Progress Notes (Signed)
Patient ID: [name], male   DOB: August 19, 1956, 57 y.o.   MRN: [id number]  I was called by Dr. Arbutus Leas last night about this patient.  He has a Pseudomonas UTI appropriately treated with ciprofloxacin.  The patient has continued to have urgency symptoms along with some dysuria and demanded to speak with a urologist. His symptoms are actually improved today compared to yesterday and he did get relief with Pyridium.  I explained that his symptoms are consistent with having a UTI and urgency and dysuria can be expected and may take some time to resolve.  He can continue to take Pyridium prn as this helped and his symptoms should improve. The patient was satisfied with this approach.

## 2013-04-17 LAB — CK: Total CK: 33 U/L (ref 7–232)

## 2013-04-17 LAB — MAGNESIUM: Magnesium: 1.9 mg/dL (ref 1.5–2.5)

## 2013-04-17 LAB — BASIC METABOLIC PANEL
CO2: 17 mEq/L — ABNORMAL LOW (ref 19–32)
Chloride: 118 mEq/L — ABNORMAL HIGH (ref 96–112)
GFR calc Af Amer: 51 mL/min — ABNORMAL LOW (ref >90)
Sodium: 144 mEq/L (ref 135–145)

## 2013-04-17 MED ORDER — TRACE MINERALS CR-CU-F-FE-I-MN-MO-SE-ZN IV SOLN
INTRAVENOUS | Status: DC
  Administered 2013-04-17: 17:00:00 via INTRAVENOUS
  Filled 2013-04-17: qty 2400

## 2013-04-17 MED ORDER — SODIUM CHLORIDE 0.9 % IV SOLN: INTRAVENOUS | Status: DC

## 2013-04-17 MED ORDER — TAMSULOSIN HCL 0.4 MG PO CAPS: 0.4000 mg | ORAL_CAPSULE | Freq: Every day | ORAL | Status: DC

## 2013-04-17 MED ORDER — FAT EMULSION 20 % IV EMUL: 250.0000 mL | INTRAVENOUS | Status: DC

## 2013-04-17 MED ORDER — FAT EMULSION 20 % IV EMUL
250.0000 mL | INTRAVENOUS | Status: DC
  Administered 2013-04-17: 250 mL via INTRAVENOUS
  Filled 2013-04-17: qty 250

## 2013-04-17 MED ORDER — CIPROFLOXACIN IN D5W 400 MG/200ML IV SOLN: 400.0000 mg | Freq: Two times a day (BID) | INTRAVENOUS | Status: DC

## 2013-04-17 NOTE — Progress Notes (Signed)
[id number];UVOZD Alan Benninger,RN,BSN,CCM Patient has agree to be transferred to [facility] for continued care at this level.  NS infusion are at 400cc/hrs, and tpn solution is at 100cc/hr, pt continued to have copious amount of drainage and required renal replacement and support with the normal saline.  [facility] Snell at [facility] the tpn can not be made until at least Monday May 5th and there is a Sport and exercise psychologist and backlog of normal saline. Patient and son are agreeable to the transfer and do understand the reasoning of the patient going to [facility] and not home at this point.  Dr. Arbutus Leas was also in person during this conversation and is also agreeable that an ltac at this point is the safest and sound decision of care.

## 2013-04-17 NOTE — Progress Notes (Signed)
Report called to Tess, Charity fundraiser at [facility]

## 2013-04-17 NOTE — Progress Notes (Signed)
PARENTERAL NUTRITION CONSULT NOTE - FOLLOW UP  Pharmacy Consult for TPN Indication: enterocutaneous fistula, abdominal carcinomatosis  Allergies  Allergen Reactions  . Percocet (Oxycodone-Acetaminophen) Nausea And Vomiting    hallucination   Patient Measurements: Height: 6\' 4"  (193 cm) Weight: 186 lb 8.2 oz (84.6 kg) IBW/kg (Calculated) : 86.8 Usual Weight: 170-180 lbs  Vital Signs: Temp: 98 F (36.7 C) (05/02 0000) Temp src: Oral (05/02 0000) BP: 120/80 mmHg (05/02 0155) Pulse Rate: 78 (05/02 0155) Intake/Output from previous day: 05/01 0701 - 05/02 0700 In: 8762.7 [I.V.:6213.3; IV Piggyback:200; TPN:2349.3] Out: 6550 [Urine:4350; Drains:2150; Stool:50]  Labs:  Recent Labs  04/15/13 0420 04/16/13 0500  WBC 2.8* 3.3*  HGB 6.9* 8.0*  HCT 20.2* 23.2*  PLT 197 204    Recent Labs  04/15/13 0420 04/16/13 0500 04/17/13 0430  NA 138 139 144  K 4.4 4.8 4.3  CL 110 111 118*  CO2 21 19 17*  GLUCOSE 96 90 107*  BUN 42* 46* 47*  CREATININE 1.57* 1.69* 1.67*  CALCIUM 8.6 8.9 9.0  MG  --  1.1* 1.9  PHOS  --  5.6*  --   PROT  --  5.6*  --   ALBUMIN  --  1.7*  --   AST  --  30  --   ALT  --  38  --   ALKPHOS  --  376*  --   BILITOT  --  0.6  --    Estimated Creatinine Clearance: 59.1 ml/min (by C-G formula based on Cr of 1.67).    Recent Labs  04/16/13 0355 04/16/13 0741 04/16/13 1111  GLUCAP 98 90 88    Insulin Requirements in the past 24 hours:  CBGs < 150, NO moderate SSI required/24h (SSI d/c on 5/1 per MD order) (Regular insulin removed from TPN 4/23)  IVF per Nephrology: NS with 10KCl at 440ml/hr  Current Nutrition:  Clinimix 5/15 at 100 ml/hr + lipids at 10 ml/hr MWF. 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 Discharged from [facility] recently and was transferred to his nursing home in [place] started developing N/V and weakness. Was receiving TPN cycled over 18 hours at [facility]. Will continue TPN inpatient but will be difficult to provide large protein and kcal amounts with premixed Clinimix TNAs.  RD evaluated the patient 4/18 and recommended a reduced protein goal than PTA.  Pharmacy will be able to meet new nutrition goals with the premixed Clinimix product.  Will provide TNA continuously inpatient due to high volume that needs to be administered with premixed product.  Labs:  CBGs: at goal < 150mg /dl  Electrolytes: K and Na wnl (IVF changed 5/1; K decreased, Na trending up); elevated Phos yesterday (5.6); Corr Ca = 10.884; Mag improved to wnl  LFTs: AST/ALT wnl (5/1), alkPhos rising some (264 to 376)  TGs: 195 (4/19), 177 (4/21), 164 (4/28)  Prealbumin: 33 (4/19),  28.2 (4/21), 18.6 (4/28)  Renal: ARF from hypotension/volume depletion; SCr rising again. I/O net +2.2L /24h, UOP = 2.1 ml/kg/hr, drains= 2.1 L, stool = 50mL, no PO intake charted 5/1.     Plan:  At 1800 today:  Continue TNA without electrolytes given elevated phosphorus and normal lytes.  The available formulation is Clinimix 5/15, continue 116ml/hr.  Continue no insulin in TPN, particularly with decrease dextrose formulation.  SSI d/c per MD order.  Decrease frequency of CBGs from q4h to q8h. Fat emulsion at 10 ml/hr (MWF only due to ongoing shortage).  TNA to contain standard multivitamins and trace elements (MWF only due to ongoing shortage). Will continue to use IV as  unsure of enteral absorption given massive GI losses. Also vitamin C and zinc continued from home. TNA lab panels on Mondays & Thursdays.  BMET, mag and phos tomorrow. Follow up fluid requirements with apparent decrease in GI output.    Lynann Beaver PharmD, BCPS Pager [phone] 04/17/2013 6:34 AM

## 2013-04-17 NOTE — Discharge Summary (Addendum)
Physician Discharge Summary  [name] ZOX:[id number] DOB: 1956/08/24 DOA: 04/03/2013  PCP: [name], MD  Admit date: 04/03/2013 Discharge date: 04/17/2013  Recommendations for Outpatient Follow-up:  1. Pt will need to follow up with PCP in 2 weeks post discharge 2. Please obtain BMP to evaluate electrolytes and kidney function 3. Please also check CBC to evaluate Hg and Hct levels 4. Follow up with pt's oncologist, Dr. Leavy Cella  Discharge Diagnoses:  Principal Problem:   Dehydration secondary to nausea and vomiting Active Problems:   Abdominal malignant neoplasm   Hypokalemia   Acute kidney injury secondary to dehydration Acute renal failure:- Due to impressive GI losses that need to be matched 1:1. Nephrology followed patient, signed off 4/28.  - difficult to manage his fluid status  - have tried to use Octreotide to decrease gi losses, patient refused.  -readdressed octreotide 04/15/13--continued refusal--pt does not feels it helped in past  Over the past 24 hours:  IN (ml): 9762 (TPN + po + 0.9% NaCl with 10 mEq K)  O (ml): 8350 Current drips: TPN 100 cc/h; NS with 10 KCl 400 cc/h continuous (changed from 300 cc/h 4/29).  -Patient is approximately +7 L since 04/10/2013  - Patient is a higher level of care for now we can achieve on the floor, so staying in step down unit.  -On the day of potential discharge to [facility], the patient changed his mind and decided that he wanted to go home if the home health agency[facility] is willing to provide him services -Spoke with case manager whom informed me that home health services is unable to provide what the patient needs for home -On 04/17/2013, I spoke with Dr. Ignacia Felling from [facility] whom stated that patient is approved to go to LTAC ASAP - [facility]--had evaluated the patient on 04/15/13 and had bed availability on 04/17/13 -Dr. Ignacia Felling from [facility] spoke with pt's oncologist, Dr. Leavy Cella who did not feel that a  transfer to [facility] was needed at this time and recommended LTAC -Dr. Leavy Cella recommended restarted Sandostatin, but pt continues to refuse - Patient continues to be a full code and wants to pursue aggressive chemotherapy at this point. Should he change his mind and want to talk to palliative again he will let me know. He was seen by palliative in January of 2014 and was discharged with hospice at that time.  - He appears very focused on obtaining chemotherapy for his cancer; Dr. Elvera Lennox had long conversations with the patient and the patient's brother that he has significant multiple additional problems and chemotherapy may shrink the tumor further; however, it will not be able to fix ongoing severe GI losses, his deconditioning, the fact that he is not able to eat and relies on TPN , and overall health. I asked him if he considers that being in the hospital for the past 3 months represents quality of life that he'll like for himself, however he continues to avoid answering directly that question and has hopes that chemotherapy would get him back on his feet. " I feel great right now. I'm not just going to roll over and die"  -Continues to have insistence on chemo therapy--I informed patient regarding Dr. Charlean Sanfilippo conversation with Dr. Leavy Cella on 04/14/13-->Dr. Sharrell Ku impression was that he may not be a good chemotherapy candidate but if he improves he will consider it.  -On the day of discharge, patient's electrolytes showed sodium 134, potassium 4.3, chloride 118, CO2 17,  BUN 47, creatinine 1.67, magnesium 1.9, CPK 33; serum B12 804, RBC folate 1095, iron saturation 61% -The patient's renal function has remained stable with a serum creatinine between 1.4-1.6 for the past 6 days. Abdominal carcinomatosis -  -Treatment per his oncologist, Dr. Leavy Cella Pseudomonas Bacteruria - on Ciprofloxacin based on sensitivities  -Dr. Laverle Patter saw pt regarding dysuria and recommended prn pyridium--no urologic intervention is  necessary -Today is day #5 for ciprofloxacin History of gout - presently not having any flares.  Neuropathic pain - persistent on Lyrica 50 TID, increased to 75 TID.  Hypokalemia: replete as needed.  -Potassium is trending up  -Decrease potassium in IV fluids-->K is better on lab Hyper/hyponatremia - management as above.  Nutrition  -on TPN and clear liquid diet. G tube drain per shift.  -Discontinue Accu-Cheks  DVT prophylaxis - lovenox  Anemia of chronic disease  -Check iron studies--iron saturation 61%  -RBC folate 1095, B12--804  -Transfuse 1 unit PRBC 04/15/2013  -Patient received 1 unit PRBC during his stay. Family Communication: Pt at beside  Disposition Plan: LTAC when bed available  Antibiotics:  Ciprofloxacin 04/13/2013>>>   Discharge Condition: stable  Disposition: LTAC  Diet:clear liquid Wt Readings from Last 3 Encounters:  04/17/13 84.6 kg (186 lb 8.2 oz)  01/19/13 84.5 kg (186 lb 4.6 oz)  12/30/12 92.534 kg (204 lb)    History of present illness:  57 y.o. 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 In the ER patient was found to be hypotensive with elevated lactic acid levels, 3.7. After fluid resuscitation, his left it doesn't improve to 1.4. The patient was found to be hypernatremic and hypokalemic which was thought to be due to the patient's massive GI losses  from his fistulas. Patient was given 2 L normal saline bolus and his blood pressure is improving at this time. Patient denies any fever chills. Patient was admitted for dehydration secondary to nausea and vomiting. Patient states that his G-tube was not connected to the suction and this caused nausea and vomiting and he eventually got dehydrated and felt weak.  Denies any chest pain or shortness of breath. Patient is afebrile and does not have any signs of sepsis.  Patient was originally on hospice care but in February, 2 months ago went to [facility] and got additional chemotherapy and was there until yesterday since February. Presently patient is receiving chemotherapy every 10 days since February. The patient was continued on his midodrine and florinef. Nephrology was consulted as the patient's serum creatinine peaked at 1.9. They recommended volume repletion and free water administration as the patient continued to have a sodium and free water deficit. Nephrology made adjustments to the patient's fluid repletion and electrolyte repletion. [name], one of the patient's oncologists came to evaluate the patient.  He did not feel that it was safe to treat the patient with chemotherapy nor did he thinks it will help the patient in the short term. He recommended that the patient followup with his outside oncologist, Dr.  Hermina [facility] was asked to see the patient.  They did not feel that the patient was a good surgical candidate and they had nothing to offer from a surgical standpoint. They recommended correction of the patient's dehydration and electrolytes and to continue on TPN. Nephrology addressed the possibility of restarting Sandostatin with the patient in hopes of decreasing the patient's fistula output. However the patient refused to restart Sandostatin due to "side effects". Nephrology continued to adjust the patient's fluids to correct his electrolytes and manage his GI  losses. Ultimately, the patient was started on normal saline with KCl at 100 cc an hour with continued TPN. PharmD help with formluation of TPN.  The patient continued to refuse Sandostatin despite multiple conversations regarding the risks and benefits.  He felt it made him feel bad and "did not do anything for me".     Consultants: Nephrology-[facility] Urology-Dr. Laverle Patter MedOnc-[name]  Discharge Exam: Filed Vitals:   04/17/13 1300  BP:   Pulse: 71  Temp:   Resp: 21   Filed Vitals:   04/17/13 1100 04/17/13 1200 04/17/13 1243 04/17/13 1300  BP:   133/84   Pulse: 115   71  Temp:  97.5 F (36.4 C)    TempSrc:  Oral    Resp: 25  18 21   Height:      Weight:      SpO2: 57%   100%   General: A&O x 3, NAD, pleasant, cooperative Cardiovascular: RRR, no rub, no gallop, no S3 Respiratory: CTAB, no wheeze, no rhonchi Abdomen:soft, nontender, nondistended, positive bowel sounds Extremities: No edema, No lymphangitis, no petechiae  Discharge Instructions      Discharge Orders   Future Orders Complete By Expires     Diet - low sodium heart healthy  As directed     Discharge instructions  As directed     Comments:      Continue TPN with standard electrolytes with lipids three times per week Continue cipro x 8 more days    Increase activity slowly  As directed         Medication List    TAKE these medications       ciprofloxacin 400 MG/200ML Soln  Commonly known as:  CIPRO  Inject 200 mLs (400 mg total) into the vein every 12 (twelve) hours.     clindamycin 1 % lotion  Commonly known as:  CLEOCIN T  Apply 1 application topically 2 (two) times daily. Apply to chest and face     enoxaparin 40 MG/0.4ML injection  Commonly known as:  LOVENOX  Inject 40 mg into the skin every morning.     fat emulsion 20 % infusion  Inject 250 mLs into the vein continuous.     fludrocortisone 0.1 MG tablet  Commonly known as:  FLORINEF  Take 0.1 mg by mouth 2 (two)  times daily.     HYDROcodone-acetaminophen 5-325 MG per tablet  Commonly known as:  NORCO/VICODIN  Take 1 tablet by mouth every 4 (four) hours as needed for pain.     midodrine 10 MG tablet  Commonly known as:  PROAMATINE  Take 10 mg by mouth 3 (three) times daily.     minocycline 100 MG capsule  Commonly known as:  MINOCIN,DYNACIN  Take 100 mg by mouth 2 (two) times daily.     nitroGLYCERIN 0.4 MG SL tablet  Commonly known as:  NITROSTAT  Place 0.4 mg under the tongue every 5 (five) minutes  as needed for chest pain. For chest pain, Hold for SBP<100     pantoprazole 40 MG tablet  Commonly known as:  PROTONIX  Take 40 mg by mouth every morning.     pregabalin 50 MG capsule  Commonly known as:  LYRICA  Take 50 mg by mouth 3 (three) times daily.     promethazine 12.5 MG tablet  Commonly known as:  PHENERGAN  Take 12.5 mg by mouth every 6 (six) hours as needed for nausea.     sodium chloride 0.9 % SOLN 1,000 mL with potassium chloride 2 mEq/ml SOLN 10 mEq  0.9NS with KCL to run at 400cc/hr     sucralfate 1 GM/10ML suspension  Commonly known as:  CARAFATE  Take 1 g by mouth 4 (four) times daily.     tamsulosin 0.4 MG Caps  Commonly known as:  FLOMAX  Take 1 capsule (0.4 mg total) by mouth daily.     vitamin C 500 MG tablet  Commonly known as:  ASCORBIC ACID  Take 500 mg by mouth every morning.     zinc sulfate 220 MG capsule  Take 220 mg by mouth every morning.         The results of significant diagnostics from this hospitalization (including imaging, microbiology, ancillary and laboratory) are listed below for reference.    Significant Diagnostic Studies: US Renal  04/06/2013  *RADIOLOGY REPORT*  Clinical Data: Renal insufficiency and history of peritoneal carcinomatosis.  RENAL/URINARY TRACT ULTRASOUND COMPLETE  Comparison:  CT of the abdomen on 12/16/2012.  Findings:  Right Kidney:  The right kidney measures approximately 12.3 cm in length.  Sonographic  appearance is normal without evidence of hydronephrosis or focal lesion.  Left Kidney:  The left kidney measures approximately 13 cm in length and has a normal sonographic appearance without evidence of hydronephrosis.  Small cyst of the mid kidney measures 1.2 cm has a benign appearance.  Bladder:  The bladder was mildly distended at the time imaging and unremarkable in appearance.  IMPRESSION: Unremarkable renal ultrasound.  No evidence of hydronephrosis bilaterally.   Original Report Authenticated By: Irish Lack, M.D.    Dg Chest Port 1 View  04/03/2013  *RADIOLOGY REPORT*  Clinical Data: Weakness and shortness of breath.  PORTABLE CHEST - 1 VIEW  Comparison: Chest radiograph performed 01/17/2013  Findings: The patient's right PICC is noted ending about the mid SVC.  The lungs are well-aerated and clear.  There is no evidence of focal opacification, pleural effusion or pneumothorax.  The cardiomediastinal silhouette is within normal limits.  No acute osseous abnormalities are seen.  IMPRESSION: No acute cardiopulmonary process seen.   Original Report Authenticated By: Tonia Ghent, M.D.      Microbiology: Recent Results (from the past 240 hour(s))  URINE CULTURE     Status: None   Collection Time    04/11/13  4:55 AM      Result Value Range Status   Specimen Description URINE, CLEAN CATCH   Final   Special Requests NONE   Final   Culture  Setup Time 04/11/2013 11:57   Final   Colony Count >=100,000 COLONIES/ML   Final   Culture PSEUDOMONAS AERUGINOSA   Final   Report Status 04/13/2013 FINAL   Final   Organism ID, Bacteria PSEUDOMONAS AERUGINOSA   Final  URINE CULTURE     Status: None   Collection Time    04/15/13  8:21 PM      Result Value Range Status  Specimen Description URINE, CLEAN CATCH   Final   Special Requests NONE   Final   Culture  Setup Time 04/16/2013 03:00   Final   Colony Count NO GROWTH   Final   Culture NO GROWTH   Final   Report Status 04/16/2013 FINAL   Final      Labs: Basic Metabolic Panel:  Recent Labs Lab 04/11/13 0437  04/12/13 0402  04/13/13 0400  04/14/13 0500 04/14/13 1600 04/15/13 0420 04/16/13 0500 04/17/13 0430  NA 148*  < > 140  < > 138  < > 137 137 138 139 144  K 3.5  < > 4.1  < > 4.2  < > 4.5 4.5 4.4 4.8 4.3  CL 115*  < > 113*  < > 108  < > 107 109 110 111 118*  CO2 23  < > 21  < > 22  < > 21 20 21 19  17*  GLUCOSE 151*  < > 112*  < > 103*  < > 107* 99 96 90 107*  BUN 30*  < > 33*  < > 40*  < > 42* 41* 42* 46* 47*  CREATININE 1.29  < > 1.26  < > 1.47*  < > 1.62* 1.53* 1.57* 1.69* 1.67*  CALCIUM 9.0  < > 8.6  < > 8.7  < > 8.8 8.6 8.6 8.9 9.0  MG 1.6  --  1.8  --  1.6  --  1.7  --   --  1.1* 1.9  PHOS 6.5*  --  4.8*  --  5.5*  --  5.8*  --   --  5.6*  --   < > = values in this interval not displayed. Liver Function Tests:  Recent Labs Lab 04/13/13 0400 04/14/13 0500 04/16/13 0500  AST 56* 23 30  ALT 48 37 38  ALKPHOS 274* 264* 376*  BILITOT 0.7 0.4 0.6  PROT 5.6* 5.7* 5.6*  ALBUMIN 1.7* 1.7* 1.7*   No results found for this basename: LIPASE, AMYLASE,  in the last 168 hours No results found for this basename: AMMONIA,  in the last 168 hours CBC:  Recent Labs Lab 04/12/13 0402 04/12/13 1800 04/13/13 0400 04/15/13 0420 04/16/13 0500  WBC 2.7* 3.4* 3.1* 2.8* 3.3*  NEUTROABS  --   --  1.7  --   --   HGB 7.5* 8.1* 7.5* 6.9* 8.0*  HCT 21.7* 23.2* 21.9* 20.2* 23.2*  MCV 93.5 93.5 94.0 93.5 92.8  PLT 190 219 210 197 204   Cardiac Enzymes:  Recent Labs Lab 04/17/13 0430  CKTOTAL 33   BNP: No components found with this basename: POCBNP,  CBG:  Recent Labs Lab 04/15/13 2002 04/15/13 2354 04/16/13 0355 04/16/13 0741 04/16/13 1111  GLUCAP 101* 133* 98 90 88    Time coordinating discharge:  Greater than 30 minutes  Signed:  Ajeet Casasola, DO [facility] Pager: [phone] 04/17/2013, 4:25 PM

## 2013-04-17 NOTE — Progress Notes (Signed)
Physical Therapy Treatment Patient Details Name: [name] MRN: [id number] DOB: 11/30/1956 Today's Date: 04/17/2013 Time: 1014-1040 PT Time Calculation (min): 26 min  PT Assessment / Plan / Recommendation Comments on Treatment Session  Pt continues to make progress and is very motivated to do his best with therapy.  His HR did increase to 154 during ambulation today.  RN made aware.     Follow Up Recommendations  SNF;Supervision/Assistance - 24 hour;LTACH     Does the patient have the potential to tolerate intense rehabilitation     Barriers to Discharge        Equipment Recommendations  None recommended by PT    Recommendations for Other Services    Frequency Min 3X/week   Plan Discharge plan needs to be updated    Precautions / Restrictions Precautions Precautions: Fall Precaution Comments: multiple lines/drains, monitor HR Restrictions Weight Bearing Restrictions: No   Pertinent Vitals/Pain Pt continues to state pain from UTI, however seems improved from yesterday.     Mobility  Bed Mobility Bed Mobility: Supine to Sit Supine to Sit: 5: Supervision Details for Bed Mobility Assistance: Supervision for safety with min cues for safety and technique.  Transfers Transfers: Sit to Stand;Stand to Sit Sit to Stand: 4: Min assist;3: Mod assist;With upper extremity assist;From bed;From chair/3-in-1 Stand to Sit: 4: Min guard;With upper extremity assist;With armrests;To chair/3-in-1 Details for Transfer Assistance: Performed x 2 in order to allow rest break.  Sat in chair without arm rests, therefore required increased assist to rise from it with cues for hand placement and safety.  Ambulation/Gait Ambulation/Gait Assistance: 1: +2 Total assist Ambulation/Gait: Patient Percentage: 80% Ambulation Distance (Feet): 60 Feet (x2) Assistive device: Rolling walker Ambulation/Gait Assistance Details: Continues to require cues for slower gait speed for increased safety and to not  step too far inside of RW.  Noted that HR increased to 154 during amb back to room.  RN aware.  HR back down to 120's with rest.  Gait Pattern: Step-through pattern Gait velocity: Increased  General Gait Details: Continues to have unsteady gait pattern     Exercises     PT Diagnosis:    PT Problem List:   PT Treatment Interventions:     PT Goals Acute Rehab PT Goals PT Goal Formulation: With patient Time For Goal Achievement: 04/30/13 Potential to Achieve Goals: Good Pt will go Sit to Stand: with min assist PT Goal: Sit to Stand - Progress: Progressing toward goal Pt will go Stand to Sit: with supervision PT Goal: Stand to Sit - Progress: Progressing toward goal Pt will Ambulate: 51 - 150 feet;with min assist;with least restrictive assistive device PT Goal: Ambulate - Progress: Progressing toward goal  Visit Information  Last PT Received On: 04/17/13 Assistance Needed: +2    Subjective Data  Subjective: I'm ready.  yall are early today.  Patient Stated Goal: home   Cognition  Cognition Arousal/Alertness: Awake/alert Behavior During Therapy: WFL for tasks assessed/performed Overall Cognitive Status: Within Functional Limits for tasks assessed    Balance     End of Session PT - End of Session Equipment Utilized During Treatment: Gait belt Activity Tolerance: Patient tolerated treatment well;Patient limited by fatigue Patient left: in chair;with call bell/phone within reach;with nursing in room Nurse Communication: Mobility status   GP     Vista Deck 04/17/2013, 12:11 PM

## 2013-06-08 IMAGING — CR DG ABDOMEN ACUTE W/ 1V CHEST
3 series · 3 of 3 positions shown · non-contrast
Comparison: 12/25/2012 and earlier studies

CLINICAL DATA: Nausea, vomiting, peritoneal carcinoma.

ACUTE ABDOMEN SERIES (ABDOMEN 2 VIEW & CHEST 1 VIEW)

[w abdomen decub]
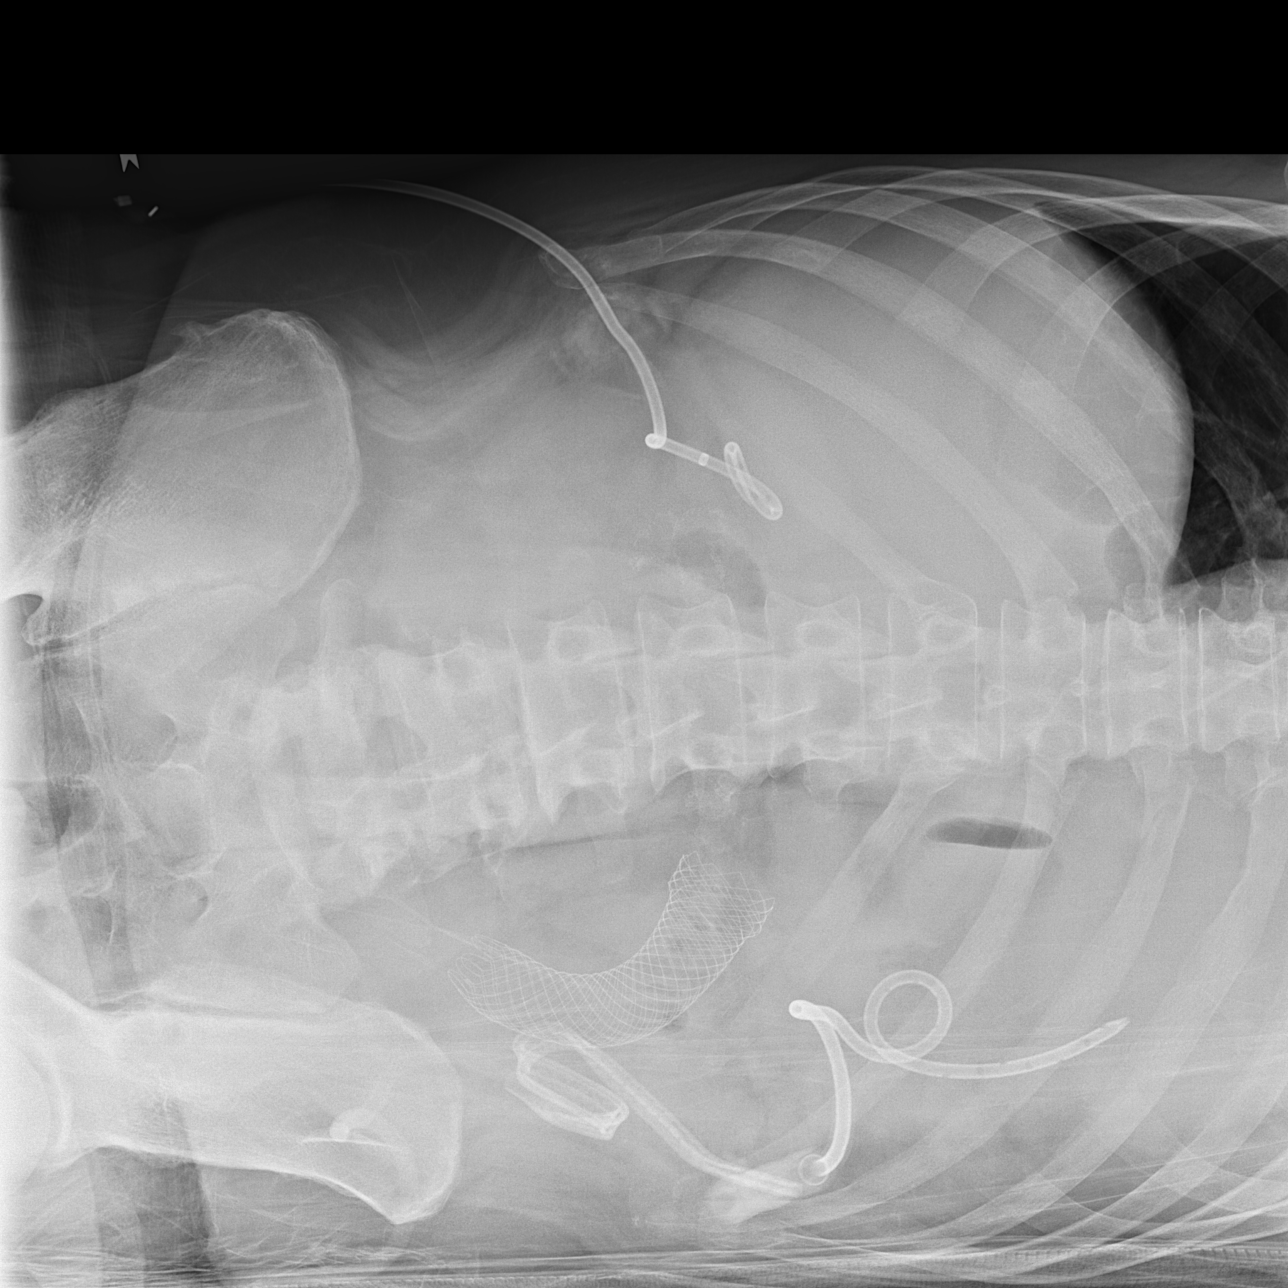

[x abdomen supine]
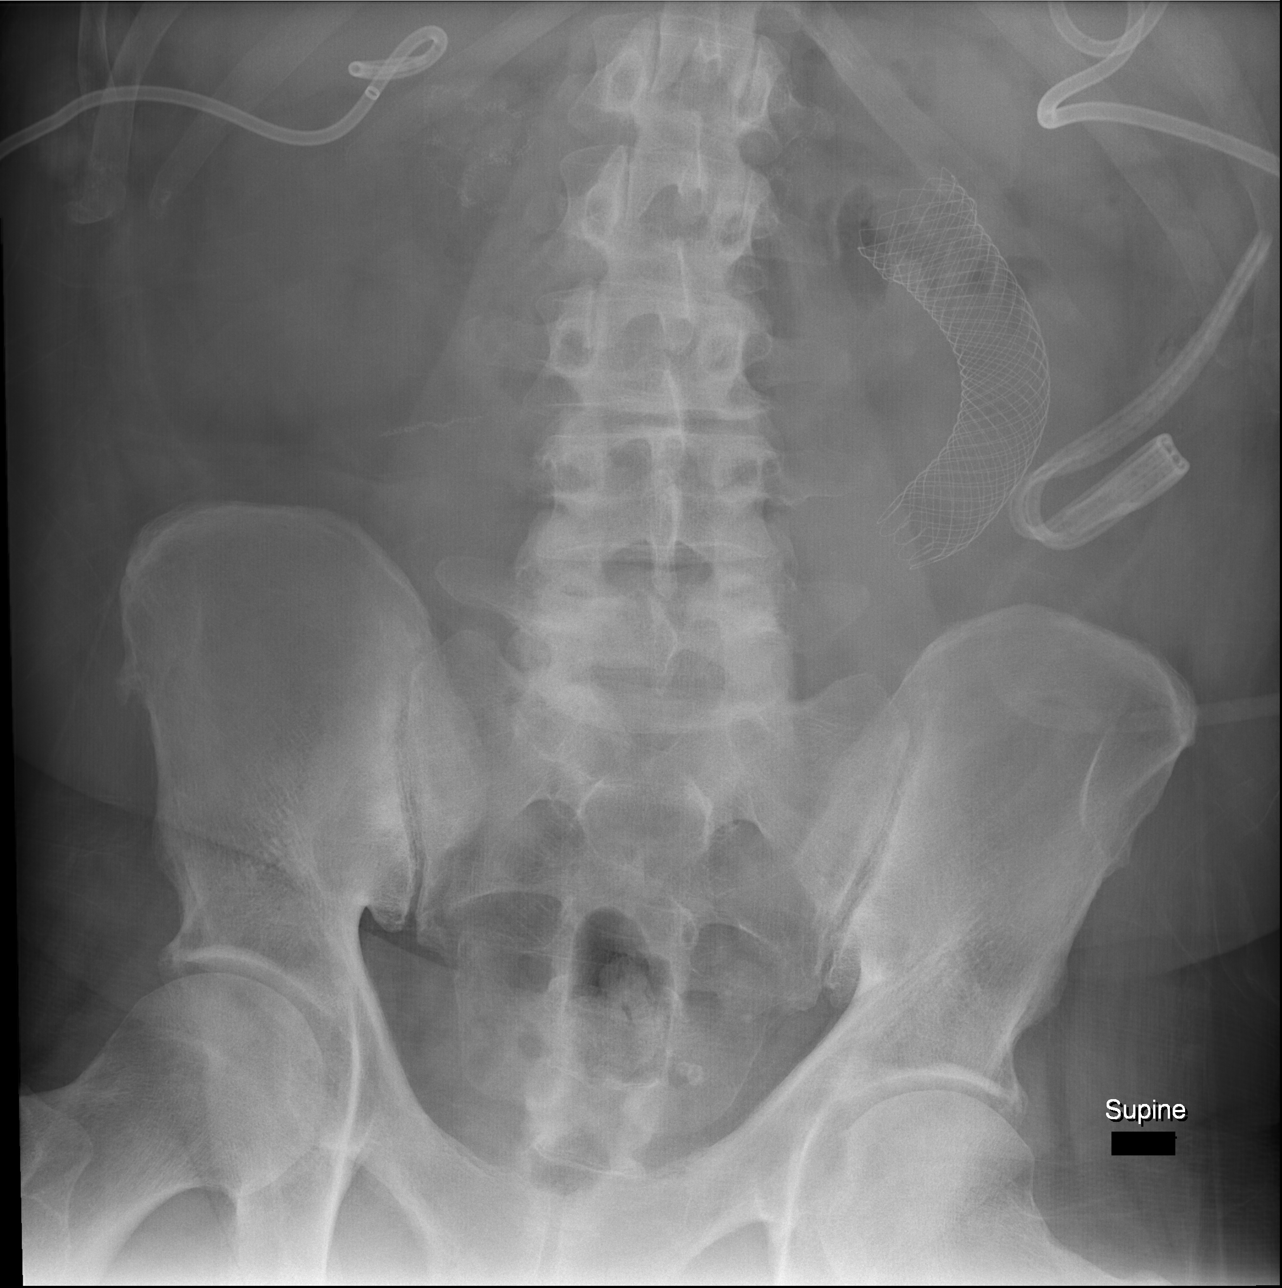

[x chest ap]
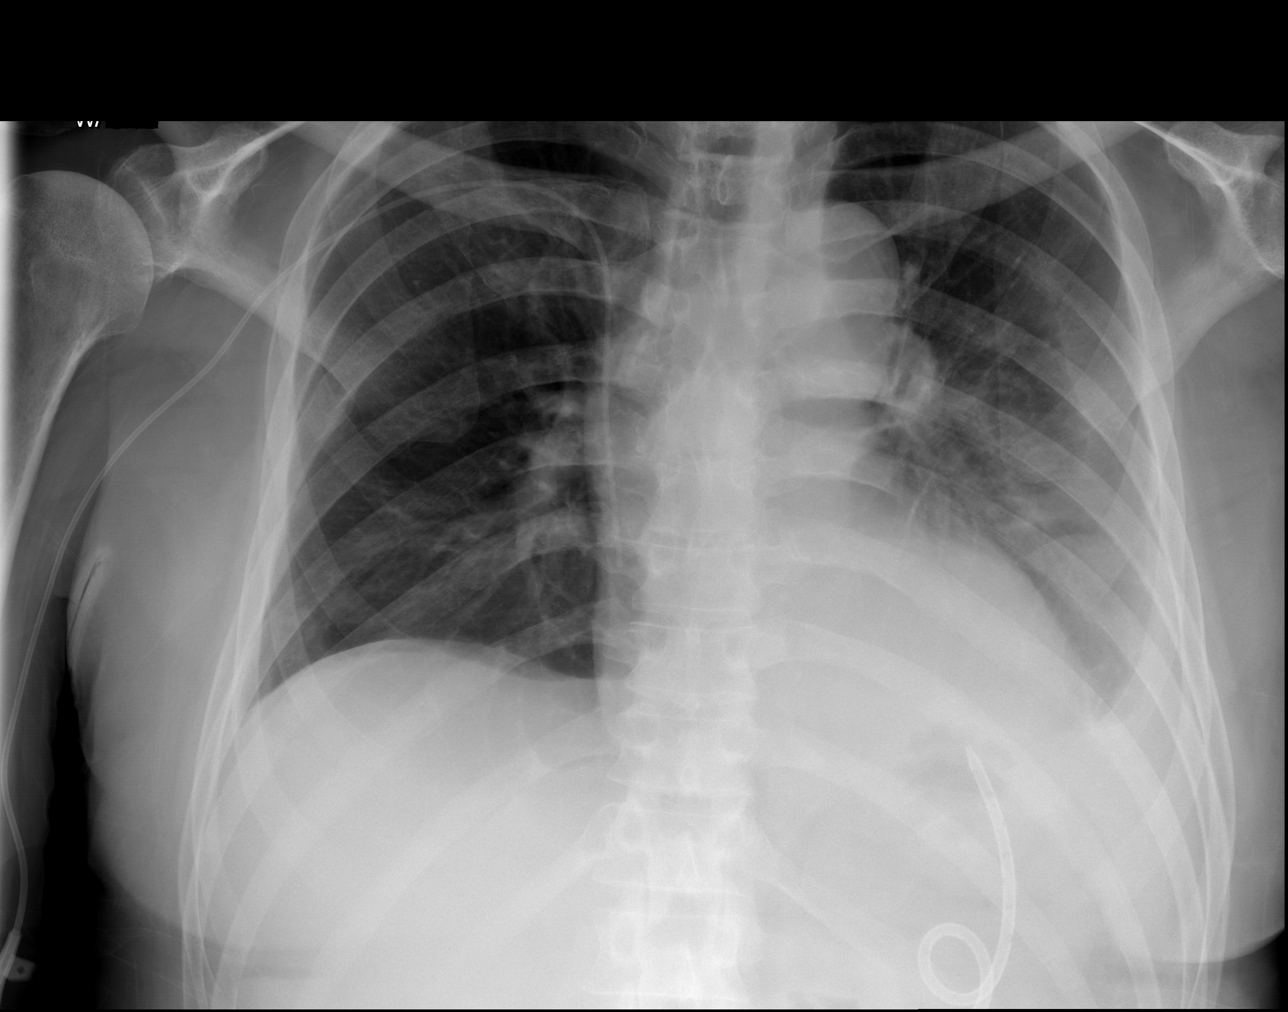

[3 of 3 positions shown; findings below may reference images not displayed]

FINDINGS: Right arm PICC extends to the proximal right atrium.
Interval increase in left pleural effusion with increasing
atelectasis/consolidation in the left lower lung.  Right lung
clear.  Heart size upper limits normal.

Gastrostomy tube projects in expected location.  No free air.
Pigtail drain in the right mid abdomen stable.  Enteric stent in
the left mid abdomen stable.  Surgical drain projects laterally in
the left abdomen as before.  There is a relative paucity of bowel
gas.  No dilated loops of bowel are evident.  No abnormal abdominal
calcifications.  Anastomotic staple line in the right mid abdomen.
IMPRESSION: 1.  Interval increase in the moderate left pleural effusion with
adjacent consolidation / atelectasis.
2.  Nonobstructive bowel gas pattern.
3.  No free air.
4.  Postop changes as above.

## 2013-08-23 ENCOUNTER — Encounter (HOSPITAL_COMMUNITY): Payer: Self-pay | Admitting: *Deleted

## 2013-08-23 ENCOUNTER — Inpatient Hospital Stay (HOSPITAL_COMMUNITY)
Admission: EM | Admit: 2013-08-23 | Discharge: 2013-09-02 | DRG: 314 | Disposition: A | Discharge status: Hospice | Payer: 59 | Attending: Family Medicine | Admitting: Family Medicine

## 2013-08-23 ENCOUNTER — Emergency Department (HOSPITAL_COMMUNITY): Payer: 59

## 2013-08-23 DIAGNOSIS — Y849 Medical procedure, unspecified as the cause of abnormal reaction of the patient, or of later complication, without mention of misadventure at the time of the procedure: Secondary | ICD-10-CM | POA: Diagnosis present

## 2013-08-23 DIAGNOSIS — B49 Unspecified mycosis: Secondary | ICD-10-CM | POA: Diagnosis present

## 2013-08-23 DIAGNOSIS — J189 Pneumonia, unspecified organism: Secondary | ICD-10-CM | POA: Diagnosis present

## 2013-08-23 DIAGNOSIS — N179 Acute kidney failure, unspecified: Secondary | ICD-10-CM | POA: Diagnosis present

## 2013-08-23 DIAGNOSIS — D630 Anemia in neoplastic disease: Secondary | ICD-10-CM | POA: Diagnosis present

## 2013-08-23 DIAGNOSIS — R627 Adult failure to thrive: Secondary | ICD-10-CM | POA: Diagnosis present

## 2013-08-23 DIAGNOSIS — E162 Hypoglycemia, unspecified: Secondary | ICD-10-CM | POA: Diagnosis present

## 2013-08-23 DIAGNOSIS — E870 Hyperosmolality and hypernatremia: Secondary | ICD-10-CM | POA: Diagnosis not present

## 2013-08-23 DIAGNOSIS — B370 Candidal stomatitis: Secondary | ICD-10-CM | POA: Diagnosis present

## 2013-08-23 DIAGNOSIS — T80211A Bloodstream infection due to central venous catheter, initial encounter: Principal | ICD-10-CM | POA: Diagnosis present

## 2013-08-23 DIAGNOSIS — I10 Essential (primary) hypertension: Secondary | ICD-10-CM | POA: Diagnosis present

## 2013-08-23 DIAGNOSIS — R7881 Bacteremia: Secondary | ICD-10-CM | POA: Diagnosis present

## 2013-08-23 DIAGNOSIS — Z515 Encounter for palliative care: Secondary | ICD-10-CM

## 2013-08-23 DIAGNOSIS — Z931 Gastrostomy status: Secondary | ICD-10-CM

## 2013-08-23 DIAGNOSIS — J9601 Acute respiratory failure with hypoxia: Secondary | ICD-10-CM | POA: Diagnosis present

## 2013-08-23 DIAGNOSIS — A4902 Methicillin resistant Staphylococcus aureus infection, unspecified site: Secondary | ICD-10-CM | POA: Diagnosis present

## 2013-08-23 DIAGNOSIS — C799 Secondary malignant neoplasm of unspecified site: Secondary | ICD-10-CM

## 2013-08-23 DIAGNOSIS — Z66 Do not resuscitate: Secondary | ICD-10-CM | POA: Diagnosis present

## 2013-08-23 DIAGNOSIS — B377 Candidal sepsis: Secondary | ICD-10-CM | POA: Diagnosis present

## 2013-08-23 DIAGNOSIS — E876 Hypokalemia: Secondary | ICD-10-CM | POA: Diagnosis not present

## 2013-08-23 DIAGNOSIS — I509 Heart failure, unspecified: Secondary | ICD-10-CM | POA: Diagnosis present

## 2013-08-23 DIAGNOSIS — C786 Secondary malignant neoplasm of retroperitoneum and peritoneum: Secondary | ICD-10-CM | POA: Diagnosis present

## 2013-08-23 DIAGNOSIS — J9600 Acute respiratory failure, unspecified whether with hypoxia or hypercapnia: Secondary | ICD-10-CM | POA: Diagnosis present

## 2013-08-23 DIAGNOSIS — K632 Fistula of intestine: Secondary | ICD-10-CM | POA: Diagnosis present

## 2013-08-23 DIAGNOSIS — R509 Fever, unspecified: Secondary | ICD-10-CM | POA: Diagnosis present

## 2013-08-23 DIAGNOSIS — G893 Neoplasm related pain (acute) (chronic): Secondary | ICD-10-CM | POA: Diagnosis present

## 2013-08-23 DIAGNOSIS — J90 Pleural effusion, not elsewhere classified: Secondary | ICD-10-CM | POA: Diagnosis present

## 2013-08-23 DIAGNOSIS — I428 Other cardiomyopathies: Secondary | ICD-10-CM | POA: Diagnosis present

## 2013-08-23 DIAGNOSIS — D638 Anemia in other chronic diseases classified elsewhere: Secondary | ICD-10-CM

## 2013-08-23 DIAGNOSIS — I5021 Acute systolic (congestive) heart failure: Secondary | ICD-10-CM | POA: Diagnosis present

## 2013-08-23 DIAGNOSIS — J690 Pneumonitis due to inhalation of food and vomit: Secondary | ICD-10-CM | POA: Insufficient documentation

## 2013-08-23 DIAGNOSIS — A419 Sepsis, unspecified organism: Secondary | ICD-10-CM | POA: Diagnosis present

## 2013-08-23 DIAGNOSIS — E43 Unspecified severe protein-calorie malnutrition: Secondary | ICD-10-CM | POA: Diagnosis present

## 2013-08-23 DIAGNOSIS — E872 Acidosis, unspecified: Secondary | ICD-10-CM | POA: Diagnosis present

## 2013-08-23 DIAGNOSIS — Z932 Ileostomy status: Secondary | ICD-10-CM

## 2013-08-23 DIAGNOSIS — C762 Malignant neoplasm of abdomen: Secondary | ICD-10-CM | POA: Diagnosis present

## 2013-08-23 HISTORY — DX: Encounter for palliative care: Z51.5

## 2013-08-23 LAB — URINALYSIS, ROUTINE W REFLEX MICROSCOPIC
Ketones, ur: NEGATIVE mg/dL
Nitrite: NEGATIVE
Protein, ur: 30 mg/dL — AB
Urobilinogen, UA: 0.2 mg/dL (ref 0.0–1.0)

## 2013-08-23 LAB — CBC WITH DIFFERENTIAL/PLATELET
Eosinophils Absolute: 0.1 10*3/uL (ref 0.0–0.7)
Eosinophils Relative: 1 % (ref 0–5)
Hemoglobin: 8.1 g/dL — ABNORMAL LOW (ref 13.0–17.0)
Lymphocytes Relative: 7 % — ABNORMAL LOW (ref 12–46)
Lymphs Abs: 0.8 10*3/uL (ref 0.7–4.0)
MCH: 29.3 pg (ref 26.0–34.0)
MCV: 93.1 fL (ref 78.0–100.0)
Monocytes Relative: 13 % — ABNORMAL HIGH (ref 3–12)
Neutrophils Relative %: 79 % — ABNORMAL HIGH (ref 43–77)
Platelets: 180 10*3/uL (ref 150–400)
RBC: 2.76 MIL/uL — ABNORMAL LOW (ref 4.22–5.81)
WBC: 10.5 10*3/uL (ref 4.0–10.5)

## 2013-08-23 LAB — COMPREHENSIVE METABOLIC PANEL
ALT: 49 U/L (ref 0–53)
Alkaline Phosphatase: 208 U/L — ABNORMAL HIGH (ref 39–117)
BUN: 40 mg/dL — ABNORMAL HIGH (ref 6–23)
CO2: 19 mEq/L (ref 19–32)
GFR calc Af Amer: 57 mL/min — ABNORMAL LOW (ref >90)
GFR calc non Af Amer: 50 mL/min — ABNORMAL LOW (ref >90)
Glucose, Bld: 99 mg/dL (ref 70–99)
Potassium: 4 mEq/L (ref 3.5–5.1)
Sodium: 139 mEq/L (ref 135–145)

## 2013-08-23 LAB — URINE MICROSCOPIC-ADD ON

## 2013-08-23 LAB — CG4 I-STAT (LACTIC ACID): Lactic Acid, Venous: 1.12 mmol/L (ref 0.5–2.2)

## 2013-08-23 LAB — PRO B NATRIURETIC PEPTIDE: Pro B Natriuretic peptide (BNP): 9174 pg/mL — ABNORMAL HIGH (ref 0–125)

## 2013-08-23 LAB — PHOSPHORUS: Phosphorus: 2.6 mg/dL (ref 2.3–4.6)

## 2013-08-23 LAB — MAGNESIUM: Magnesium: 2.4 mg/dL (ref 1.5–2.5)

## 2013-08-23 LAB — PROTIME-INR: Prothrombin Time: 19.6 seconds — ABNORMAL HIGH (ref 11.6–15.2)

## 2013-08-23 LAB — APTT: aPTT: 37 seconds (ref 24–37)

## 2013-08-23 MED ORDER — HYDROMORPHONE BOLUS VIA INFUSION
1.0000 mg | INTRAVENOUS | Status: DC | PRN
  Administered 2013-08-26 – 2013-09-02 (×4): 1 mg via INTRAVENOUS
  Filled 2013-08-23: qty 1

## 2013-08-23 MED ORDER — MAGIC MOUTHWASH
5.0000 mL | Freq: Three times a day (TID) | ORAL | Status: DC | PRN
  Administered 2013-08-31: 5 mL via ORAL
  Filled 2013-08-23: qty 5

## 2013-08-23 MED ORDER — VANCOMYCIN HCL IN DEXTROSE 1-5 GM/200ML-% IV SOLN
1000.0000 mg | Freq: Once | INTRAVENOUS | Status: AC
  Administered 2013-08-23: 1000 mg via INTRAVENOUS
  Filled 2013-08-23: qty 200

## 2013-08-23 MED ORDER — PANTOPRAZOLE SODIUM 40 MG IV SOLR
40.0000 mg | Freq: Every morning | INTRAVENOUS | Status: DC
  Administered 2013-08-23 – 2013-09-02 (×11): 40 mg via INTRAVENOUS
  Filled 2013-08-23 (×11): qty 40

## 2013-08-23 MED ORDER — FENTANYL 100 MCG/HR TD PT72: 100.0000 ug | MEDICATED_PATCH | TRANSDERMAL | Status: DC

## 2013-08-23 MED ORDER — VANCOMYCIN HCL IN DEXTROSE 750-5 MG/150ML-% IV SOLN
750.0000 mg | Freq: Two times a day (BID) | INTRAVENOUS | Status: DC
  Administered 2013-08-23: 750 mg via INTRAVENOUS
  Filled 2013-08-23 (×2): qty 150

## 2013-08-23 MED ORDER — PROMETHAZINE HCL 25 MG/ML IJ SOLN
25.0000 mg | Freq: Four times a day (QID) | INTRAMUSCULAR | Status: DC | PRN
  Administered 2013-08-23: 25 mg via INTRAVENOUS
  Filled 2013-08-23: qty 1

## 2013-08-23 MED ORDER — SODIUM CHLORIDE 0.9 % IV SOLN: INTRAVENOUS | Status: DC

## 2013-08-23 MED ORDER — ACETAMINOPHEN 10 MG/ML IV SOLN
1000.0000 mg | Freq: Once | INTRAVENOUS | Status: AC
  Administered 2013-08-23: 1000 mg via INTRAVENOUS
  Filled 2013-08-23: qty 100

## 2013-08-23 MED ORDER — ONDANSETRON HCL 4 MG/2ML IJ SOLN
4.0000 mg | Freq: Four times a day (QID) | INTRAMUSCULAR | Status: DC | PRN
  Administered 2013-08-23 – 2013-09-02 (×7): 4 mg via INTRAVENOUS
  Filled 2013-08-23 (×8): qty 2

## 2013-08-23 MED ORDER — IPRATROPIUM BROMIDE 0.02 % IN SOLN: 0.5000 mg | Freq: Four times a day (QID) | RESPIRATORY_TRACT | Status: DC | PRN

## 2013-08-23 MED ORDER — MUPIROCIN 2 % EX OINT
1.0000 "application " | TOPICAL_OINTMENT | Freq: Two times a day (BID) | CUTANEOUS | Status: AC
  Administered 2013-08-23 – 2013-08-28 (×10): 1 via NASAL
  Filled 2013-08-23 (×3): qty 22

## 2013-08-23 MED ORDER — PCA INJECTOR MISC: Status: DC

## 2013-08-23 MED ORDER — ACETAMINOPHEN 160 MG/5ML PO SOLN
650.0000 mg | Freq: Once | ORAL | Status: DC
  Filled 2013-08-23: qty 20.3

## 2013-08-23 MED ORDER — FENTANYL 100 MCG/HR TD PT72
100.0000 ug | MEDICATED_PATCH | TRANSDERMAL | Status: DC
  Administered 2013-08-23 – 2013-09-01 (×4): 100 ug via TRANSDERMAL
  Filled 2013-08-23 (×4): qty 1

## 2013-08-23 MED ORDER — DEXTROSE 5 % IV SOLN
1.0000 g | Freq: Three times a day (TID) | INTRAVENOUS | Status: DC
  Administered 2013-08-23 – 2013-08-27 (×12): 1 g via INTRAVENOUS
  Filled 2013-08-23 (×14): qty 1

## 2013-08-23 MED ORDER — SODIUM CHLORIDE 0.9 % IJ SOLN
3.0000 mL | Freq: Two times a day (BID) | INTRAMUSCULAR | Status: DC
  Administered 2013-08-25: 10 mL via INTRAVENOUS
  Administered 2013-08-26 – 2013-08-31 (×3): 3 mL via INTRAVENOUS

## 2013-08-23 MED ORDER — ACETAMINOPHEN 325 MG PO TABS
650.0000 mg | ORAL_TABLET | Freq: Four times a day (QID) | ORAL | Status: DC | PRN
  Filled 2013-08-23 (×2): qty 2

## 2013-08-23 MED ORDER — ALBUTEROL SULFATE (5 MG/ML) 0.5% IN NEBU: 2.5000 mg | INHALATION_SOLUTION | Freq: Four times a day (QID) | RESPIRATORY_TRACT | Status: DC | PRN

## 2013-08-23 MED ORDER — PROMETHAZINE HCL 25 MG/ML IJ SOLN
25.0000 mg | INTRAMUSCULAR | Status: DC | PRN
  Administered 2013-08-23 – 2013-08-31 (×12): 25 mg via INTRAVENOUS
  Filled 2013-08-23 (×11): qty 1

## 2013-08-23 MED ORDER — FLUCONAZOLE IN SODIUM CHLORIDE 200-0.9 MG/100ML-% IV SOLN
200.0000 mg | INTRAVENOUS | Status: DC
  Administered 2013-08-23: 200 mg via INTRAVENOUS
  Filled 2013-08-23: qty 100

## 2013-08-23 MED ORDER — CHLORHEXIDINE GLUCONATE CLOTH 2 % EX PADS
6.0000 | MEDICATED_PAD | Freq: Every day | CUTANEOUS | Status: DC
Start: 2013-08-24 — End: 2013-08-27
  Administered 2013-08-24 – 2013-08-26 (×3): 6 via TOPICAL

## 2013-08-23 MED ORDER — ACETAMINOPHEN 650 MG RE SUPP: 650.0000 mg | Freq: Four times a day (QID) | RECTAL | Status: DC | PRN

## 2013-08-23 MED ORDER — PIPERACILLIN-TAZOBACTAM 3.375 G IVPB
3.3750 g | Freq: Once | INTRAVENOUS | Status: AC
  Administered 2013-08-23: 3.375 g via INTRAVENOUS
  Filled 2013-08-23: qty 50

## 2013-08-23 MED ORDER — SODIUM CHLORIDE 0.9 % IV SOLN
1.0000 mg/h | INTRAVENOUS | Status: DC
  Administered 2013-08-23 – 2013-09-01 (×7): 1 mg/h via INTRAVENOUS
  Filled 2013-08-23 (×7): qty 5

## 2013-08-23 MED ORDER — SODIUM CHLORIDE 0.9 % IV SOLN
INTRAVENOUS | Status: DC
  Administered 2013-08-23: 12:00:00 via INTRAVENOUS

## 2013-08-23 MED ORDER — ALBUTEROL SULFATE (5 MG/ML) 0.5% IN NEBU
2.5000 mg | INHALATION_SOLUTION | RESPIRATORY_TRACT | Status: DC | PRN
  Administered 2013-08-23: 2.5 mg via RESPIRATORY_TRACT
  Filled 2013-08-23: qty 0.5

## 2013-08-23 NOTE — ED Provider Notes (Addendum)
CSN: [id number]     Arrival date & time 08/23/13  4098 History   First MD Initiated Contact with Patient 08/23/13 0820     Chief Complaint  Patient presents with  . Shortness of Breath  leve l 5 caveat due to altered mental status.  (Consider location/radiation/quality/duration/timing/severity/associated sxs/prior Treatment) Patient is a 57 y.o. male presenting with shortness of breath.  Shortness of Breath  patient presents with a fever. He is not be hypoxic. He has metastatic cancer and is chronically n.p.o. He has a abdominal drain, gastric drain, and right pleural drain. He had some more confusion today. No nausea vomiting. Fever of 104  Past Medical History  Diagnosis Date  . Hypertension   . GERD (gastroesophageal reflux disease)   . Small bowel obstruction 2013  . 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 Acute cholecystitis s/p perc draianage [id number] 12/20/2012  . Anemia of chronic disease 12/19/2012  . 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 MRSA bacteremia 01/04/2013  . SBO (small bowel obstruction) 11/07/2011  . Septic shock due to Staphylococcus aureus 12/24/2012  . Severe protein-calorie malnutrition 01/17/2013   Past Surgical History  Procedure Laterality Date  . Tonsillectomy    . Esophagogastroduodenoscopy  12/11/2012    Procedure: ESOPHAGOGASTRODUODENOSCOPY (EGD);  Surgeon: Louis Meckel, MD;  Location: [facility] ENDOSCOPY;  Service: Endoscopy;  Laterality: N/A;  . Duodenal stent placement  12/11/2012    Procedure: DUODENAL STENT PLACEMENT;  Surgeon: Louis Meckel, MD;  Location: [facility] ENDOSCOPY;  Service: Endoscopy;  Laterality: N/A;  . Esophagogastroduodenoscopy  12/13/2012    Procedure: ESOPHAGOGASTRODUODENOSCOPY (EGD);  Surgeon: Louis Meckel, MD;  Location: [facility] OR;  Service: Endoscopy;  Laterality: N/A;  with attempt to stent duodenal stricture  . Peg placement  12/12/2012    Procedure:  PERCUTANEOUS ENDOSCOPIC GASTROSTOMY (PEG) PLACEMENT;  Surgeon: Louis Meckel, MD;  Location: [facility] ENDOSCOPY;  Service: Endoscopy;  Laterality: N/A;  . Small intestine surgery  jan 2013    [facility].  SB resection, ileostomy, gastrostomy  . Debulking  sep 2013    [facility].  ileal SB resection, debulking of peritoneal carcinomatosis, intraperitoneal chemotherapy  . Colon surgery  oct 2013    [facility] transverse colon perforation  . Enteroscopy  12/30/2012    Procedure: ENTEROSCOPY;  Surgeon: Louis Meckel, MD;  Location: [facility] ENDOSCOPY;  Service: Endoscopy;  Laterality: N/A;  With possible stent placement; please have duodenal stent available  . Duodenal stent placement  12/30/2012    Procedure: DUODENAL STENT PLACEMENT;  Surgeon: Louis Meckel, MD;  Location: [facility] ENDOSCOPY;  Service: Endoscopy;  Laterality: N/A;   Family History  Problem Relation Age of Onset  . Lung cancer Brother   . Hypertension Mother   . Hypertension Father   . Heart failure Father    History  Substance Use Topics  . Smoking status: Never Smoker   . Smokeless tobacco: Never Used  . Alcohol Use: No     Comment: Stopped in July    Review of Systems  Unable to perform ROS: Mental status change  Respiratory: Positive for shortness of breath.     Allergies  Percocet  Home Medications   Current Outpatient Rx  Name  Route  Sig  Dispense  Refill  . fat emulsion 20 % infusion   Intravenous   Inject 250 mLs into the vein daily. Intralipid Excel Container 50gm . Off  at 8am and on at 8pm.         . fentaNYL (DURAGESIC - DOSED MCG/HR) 100 MCG/HR   Transdermal   Place 1 patch onto the skin every 3 (three) days.         . Injection Device (PCA INJECTOR) MISC   Intravenous   Inject into the vein See admin instructions. Hydromorphone HCL SDV 10mg /60ml vial. dilauded PCA: final concentration 1mg /ml:1mg /hr basal rate May have 1mg  bolus every 1 hour as needed for pain         . ondansetron (ZOFRAN) 4 MG/2ML SOLN  injection   Intravenous   Inject 4 mg into the vein every 6 (six) hours as needed for nausea. Inject IV - Push every 6 hours as needed         . pantoprazole (PROTONIX) 40 MG injection   Intravenous   Inject 40 mg into the vein every morning. Infuse 40ml over 60 minutes         . promethazine (PHENERGAN) 25 MG/ML injection   Intravenous   Inject 25 mg into the vein every 6 (six) hours as needed for nausea or vomiting.         . sodium chloride 0.45 % solution   Intravenous   Inject 200 mLs into the vein continuous. Sodium chloride 1000 n/s. Infuse 0.45% NS intravenously at 269ml/hr daily         . TPN ADULT   Intravenous   Inject into the vein continuous. infuvite 200-150/10 vial. Add 10 MV to TPN just prior to hanging every day. Infuse over 20 hours. Start at 1800-1900. [facility] [phone]          BP 105/69  Pulse 112  Temp(Src) 104.2 F (40.1 C) (Rectal)  Resp 34  SpO2 91% Physical Exam  Constitutional: He appears well-developed.  HENT:  Head: Normocephalic.  Cardiovascular:  Tachycardia without murmur  Pulmonary/Chest:  Tachypnea. Mildly harsh breath sounds bilaterally  Abdominal: There is tenderness.  Mild tenderness. Epigastric ostomy. Left upper quadrant drain. Mild diffuse tenderness  Musculoskeletal:  PICC line to left upper extremity  Neurological: He is alert.  Some confusion.    ED Course  Procedures (including critical care time) Labs Review Labs Reviewed  CBC WITH DIFFERENTIAL - Abnormal; Notable for the following:    RBC 2.76 (*)    Hemoglobin 8.1 (*)    HCT 25.7 (*)    RDW 16.1 (*)    Neutrophils Relative % 79 (*)    Neutro Abs 8.3 (*)    Lymphocytes Relative 7 (*)    Monocytes Relative 13 (*)    Monocytes Absolute 1.3 (*)    All other components within normal limits  COMPREHENSIVE METABOLIC PANEL - Abnormal; Notable for the following:    Chloride 113 (*)    BUN 40 (*)    Creatinine, Ser 1.52 (*)    Albumin 2.0 (*)     Alkaline Phosphatase 208 (*)    Total Bilirubin 2.2 (*)    GFR calc non Af Amer 50 (*)    GFR calc Af Amer 57 (*)    All other components within normal limits  CULTURE, BLOOD (ROUTINE X 2)  CULTURE, BLOOD (ROUTINE X 2)  URINALYSIS, ROUTINE W REFLEX MICROSCOPIC  PRO B NATRIURETIC PEPTIDE  LEGIONELLA ANTIGEN, URINE  STREP PNEUMONIAE URINARY ANTIGEN  CG4 I-STAT (LACTIC ACID)   Imaging Review Dg Chest Port 1 View  08/23/2013   *RADIOLOGY REPORT*  Clinical Data: Shortness of breath.  Fever.  PORTABLE CHEST - 1 VIEW  Comparison: Chest x-ray 04/03/2013.  Findings: Lung volumes are low.  Bibasilar opacities may reflect areas of atelectasis and/or consolidation.  Probable small bilateral pleural effusions.  Right-sided chest tube in place with tip and sideport projecting over the right hemithorax. There is a left upper extremity PICC with tip terminating in the proximal superior vena cava. Marked enlargement of the cardiopericardial silhouette. The patient is rotated to the right on today's exam, resulting in distortion of the mediastinal contours and reduced diagnostic sensitivity and specificity for mediastinal pathology. Cephalization of the pulmonary vasculature with indistinctness of the interstitial markings which may suggest mild interstitial pulmonary edema.  IMPRESSION: 1.  Support apparatus, as above. 2.  The appearance of the chest suggests mild congestive heart failure, however, multilobar pneumonia is difficult to exclude. Clinical correlation is recommended. 3.  Small bilateral pleural effusions.   Original Report Authenticated By: Trudie Reed, M.D.    MDM   1. HCAP (healthcare-associated pneumonia)   2. Metastatic cancer   3. Abdominal malignant neoplasm   4. Acute respiratory failure with hypoxia   5. Anemia of chronic disease    Patient with fever. X-ray showed CHF versus pneumonia. He has had hypoxia. Will treat that as a source at this time. His hypotension improved with a  small fluid bolus. His lactate is normal. Urine is pending at this time. Will be admitted to step down unit by internal medicine. Discussed CODE STATUS with patient and family and he is full code and on hospice.   Date: 08/23/2013  Rate:121  Rhythm: sinus tachycardia and premature ventricular contractions (PVC)  QRS Axis: normal  Intervals: normal  ST/T Wave abnormalities: normal  Conduction Disutrbances:none  Narrative Interpretation: pvcs are new  Old EKG Reviewed: changes noted    Juliet Rude. Rubin Payor, MD 08/23/13 1102  CRITICAL CARE Performed by: Billee Cashing Total critical care time30 Critical care time was exclusive of separately billable procedures and treating other patients. Critical care was necessary to treat or prevent imminent or life-threatening deterioration. Critical care was time spent personally by me on the following activities: development of treatment plan with patient and/or surrogate as well as nursing, discussions with consultants, evaluation of patient's response to treatment, examination of patient, obtaining history from patient or surrogate, ordering and performing treatments and interventions, ordering and review of laboratory studies, ordering and review of radiographic studies, pulse oximetry and re-evaluation of patient's condition.  Patient initially required on her breather oxygen. Later titrated down to nasal cannula. Required fluid bolus for hypotension  Juliet Rude. Rubin Payor, MD 08/23/13 1103

## 2013-08-23 NOTE — ED Notes (Signed)
Per ems: pt from home, is hospice pt, hx of stomach cancer. Pt wears 2 L [place] at home, on ems arrival pt's saO2 was 82%. Placed on non-rebreather, now 100%. Lung sounds clear/ diminished. Has picc line, pca pump, g-tube

## 2013-08-23 NOTE — H&P (Addendum)
[facility] History and Physical  [name] ZOX:[id number] DOB: 1956/09/14 DOA: 08/23/2013  Referring physician: ED physician PCP: Alan Harrier, MD   Chief Complaint: fever  HPI:  57 y.o. male with complicated medical problems including metastatic adenocarcinoma, peritoneal carcinomatosis status post extensive debulking procedures and abdominal surgeries by Dr. Lenis [name] which has left him with multiple drains and fistulas, on chronic TPN via a PICC line, chronic PEG for gastric decompression, history of small bowel obstruction status post jejunal stent, ileostomy, cholecystitis status post cholecystostomy and chronic indwelling drain. He presented to [facility] ED with main concern of progressively worsening shortness of breath, present at rest and with minimal exertion, associated with subjective fevers, chills, malaise, started several days prior to the admission and with no specific alleviating factors. Pt is currently under hospice care but is full code. He has pleurex cath placed for chronic symptoms management and explains that has been helping him. He denies any specific focal neurological symptoms but explains he is tired and feels week. He denies any specific abdominal or urinary concerns.   In ED, pt found to be hypoxic with O2 saturation in 80's on RA that has quickly improved with oxygen via [place] to low 90's, Tmax 104 F in ED, and CXR findings were suggestive of possible multilobar PNA with ? Vascular congestion. TRH asked to admit for further evaluation and management in SDU.   Assessment and Plan: Principal Problem:   Acute respiratory failure with hypoxia - this appears to be multifactorial in etiology and secondary to pneumonia, pleural effusions, ? Malignant pleural effusion - will admit to SDU for further management and evaluation as pt is in mild distress and has difficulty completing sentences  - will cover broadly for now due to immunocompromised status adnd fever T max  104 F: Maxipime, Zosyn, vancomycin, and will taper down as clinically indicated - continue to provide supportive care with oxygen, nebulizer as needed - keep Pleurex cath in place   - I have discussed with family the critical nature of pt's illness and possibility that he may not respond to the medical therapy, family insisting on full code status   Active Problems:   Fever - most likely secondary to PNA but could be a component of malignancy  - ABX as noted above and tylenol as needed for symptom management    Abdominal malignant neoplasm, Metastatic adenocarcinoma, Peritoneal carcinomatosis - currently under hospice care due to advancing adenocarcinoma - pt looks chronically ill and tired, barely able to speak and per wife this is his baseline  - supportive care and ensuring comfort but not pt is full code    Anemia of chronic disease - Hg and Hct appear to be at his baseline based on the values in May 2014 - CBC in AM   Acute renal failure - Cr in 04/2013 was ~ 1.7 - this is around his baseline - hold off on IVF due to possibility of vascular congestion - repeat BMP in AM   Severe protein-calorie malnutrition - secondary to progressive malignant process and failure to thrive - nutrition consult to resume TPN    Oral thrush - Fluconazole IV and magic mouthwash   Code Status: Full Family Communication: Pt and wife at bedside Disposition Plan: Admit to stepdown unit   Review of Systems:  Constitutional: Positive for fever, chills and malaise/fatigue. Negative for diaphoresis.  HENT: Negative for hearing loss, ear pain, nosebleeds, congestion, sore throat, neck pain, tinnitus and ear discharge.   Eyes: Negative  for blurred vision, double vision, photophobia, pain, discharge and redness.  Respiratory: Negative for hemoptysis, positive for sputum production, shortness of breath, negative for wheezing and stridor.   Cardiovascular: Negative for chest pain, palpitations, orthopnea,  claudication and leg swelling.  Gastrointestinal: Negative for heartburn, constipation, blood in stool and melena.  Genitourinary: Negative for dysuria, urgency, frequency, hematuria and flank pain.  Musculoskeletal: Negative for myalgias, back pain, joint pain and falls.  Skin: Negative for itching and rash.  Neurological: Negative for tingling, tremors, sensory change, speech change, focal weakness, loss of consciousness and headaches.  Endo/Heme/Allergies: Negative for environmental allergies and polydipsia. Does not bruise/bleed easily.  Psychiatric/Behavioral: Negative for suicidal ideas. The patient is not nervous/anxious.      Past Medical History  Diagnosis Date  . Hypertension   . GERD (gastroesophageal reflux disease)   . Small bowel obstruction 2013  . 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 Acute cholecystitis s/p perc draianage [id number] 12/20/2012  . Anemia of chronic disease 12/19/2012  . 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 MRSA bacteremia 01/04/2013  . SBO (small bowel obstruction) 11/07/2011  . Septic shock due to Staphylococcus aureus 12/24/2012  . Severe protein-calorie malnutrition 01/17/2013    Past Surgical History  Procedure Laterality Date  . Tonsillectomy    . Esophagogastroduodenoscopy  12/11/2012    Procedure: ESOPHAGOGASTRODUODENOSCOPY (EGD);  Surgeon: Alan Meckel, MD;  Location: [facility] ENDOSCOPY;  Service: Endoscopy;  Laterality: N/A;  . Duodenal stent placement  12/11/2012    Procedure: DUODENAL STENT PLACEMENT;  Surgeon: Alan Meckel, MD;  Location: [facility] ENDOSCOPY;  Service: Endoscopy;  Laterality: N/A;  . Esophagogastroduodenoscopy  12/13/2012    Procedure: ESOPHAGOGASTRODUODENOSCOPY (EGD);  Surgeon: Alan Meckel, MD;  Location: [facility] OR;  Service: Endoscopy;  Laterality: N/A;  with attempt to stent duodenal stricture  . Peg placement  12/12/2012    Procedure:  PERCUTANEOUS ENDOSCOPIC GASTROSTOMY (PEG) PLACEMENT;  Surgeon: Alan Meckel, MD;  Location: [facility] ENDOSCOPY;  Service: Endoscopy;  Laterality: N/A;  . Small intestine surgery  jan 2013    [facility].  SB resection, ileostomy, gastrostomy  . Debulking  sep 2013    [facility].  ileal SB resection, debulking of peritoneal carcinomatosis, intraperitoneal chemotherapy  . Colon surgery  oct 2013    [facility] transverse colon perforation  . Enteroscopy  12/30/2012    Procedure: ENTEROSCOPY;  Surgeon: Alan Meckel, MD;  Location: [facility] ENDOSCOPY;  Service: Endoscopy;  Laterality: N/A;  With possible stent placement; please have duodenal stent available  . Duodenal stent placement  12/30/2012    Procedure: DUODENAL STENT PLACEMENT;  Surgeon: Alan Meckel, MD;  Location: [facility] ENDOSCOPY;  Service: Endoscopy;  Laterality: N/A;    Social History:  reports that he has never smoked. He has never used smokeless tobacco. He reports that he does not drink alcohol or use illicit drugs.  Allergies  Allergen Reactions  . Percocet [Oxycodone-Acetaminophen] Nausea And Vomiting    hallucination    Family History  Problem Relation Age of Onset  . Lung cancer Brother   . Hypertension Mother   . Hypertension Father   . Heart failure Father     Medication Sig  fat emulsion 20 % infusion Inject 250 mLs into the vein daily. Intralipid Excel Container 50gm . Off at 8am and on at 8pm.  fentaNYL (DURAGESIC - DOSED MCG/HR) 100 MCG/HR Place 1 patch onto  the skin every 3 (three) days.  Injection Device (PCA INJECTOR) MISC Inject into the vein See admin instructions. Hydromorphone HCL SDV 10mg /19ml vial. dilauded PCA: final concentration 1mg /ml:1mg /hr basal rate May have 1mg  bolus every 1 hour as needed for pain  ondansetron (ZOFRAN) 4 MG/2ML SOLN injection Inject 4 mg into the vein every 6 (six) hours as needed for nausea. Inject IV - Push every 6 hours as needed  pantoprazole (PROTONIX) 40 MG injection Inject 40 mg  into the vein every morning. Infuse 40ml over 60 minutes  promethazine (PHENERGAN) 25 MG/ML injection Inject 25 mg into the vein every 6 (six) hours as needed for nausea or vomiting.  sodium chloride 0.45 % solution Inject 200 mLs into the vein continuous. Sodium chloride 1000 n/s. Infuse 0.45% NS intravenously at 291ml/hr daily  TPN ADULT Inject into the vein continuous. infuvite 200-150/10 vial. Add 10 MV to TPN just prior to hanging every day. Infuse over 20 hours. Start at 1800-1900. [facility] [phone]    Physical Exam: Filed Vitals:   08/23/13 0822 08/23/13 0831 08/23/13 0834  BP:  99/68   Pulse:  122   Temp:  104.2 F (40.1 C)   TempSrc:  Rectal   Resp:  34   SpO2: 82% 93% 94%    Physical Exam  Constitutional: Appears frail and tired, soft voice, chronically ill, in mild distress and difficulty with completing sentences  HENT: Normocephalic. External right and left ear normal. Dry MM with oral thrush  Eyes: Conjunctivae and EOM are normal. PERRLA, no scleral icterus.  Neck: Normal ROM. Neck supple. No JVD. No tracheal deviation. No thyromegaly.  CVS: Regular rhythm, tachycardic, S1/S2 +, no murmurs, no gallops, no carotid bruit.  Pulmonary: Diminished respiratory effort and crackles bilaterally, poor air movement, pleurex on the right side  Abdominal: Soft. BS +,  slightly distended, epigastric tenderness, no rebound or guarding.  Musculoskeletal: Normal range of motion but very weak and barely able to move his arms.  Lymphadenopathy: No lymphadenopathy noted, cervical, inguinal. Neuro: Alert. Normal reflexes, unable to move his arms and legs and requires lots of assistance.  Skin: Groin area with rash, unkempt appearing  Psychiatric: Difficult to assess due to critical illness   Labs on Admission:  Basic Metabolic Panel:  Recent Labs Lab 08/23/13 0930  NA 139  K 4.0  CL 113*  CO2 19  GLUCOSE 99  BUN 40*  CREATININE 1.52*  CALCIUM 8.8   Liver Function  Tests:  Recent Labs Lab 08/23/13 0930  AST 37  ALT 49  ALKPHOS 208*  BILITOT 2.2*  PROT 7.1  ALBUMIN 2.0*   CBC:  Recent Labs Lab 08/23/13 0930  WBC 10.5  NEUTROABS 8.3*  HGB 8.1*  HCT 25.7*  MCV 93.1  PLT 180   Radiological Exams on Admission: Dg Chest Port 1 View  08/23/2013   1.  Support apparatus, as above. 2.  The appearance of the chest suggests mild congestive heart failure, however, multilobar pneumonia is difficult to exclude. Clinical correlation is recommended. 3.  Small bilateral pleural effusions.   EKG: Normal sinus rhythm, no ST/T wave changes  Debbora Presto, MD  [facility] Pager [phone]  If 7PM-7AM, please contact night-coverage [email or web address] Password [facility] 08/23/2013, 10:44 AM

## 2013-08-23 NOTE — Progress Notes (Signed)
ANTIBIOTIC CONSULT NOTE - INITIAL  Pharmacy Consult for Vancomycin, Consult for renal adjustment to antibiotics Indication: pneumonia  Allergies  Allergen Reactions  . Percocet [Oxycodone-Acetaminophen] Nausea And Vomiting    hallucination    Patient Measurements:    Vital Signs: Temp: 104.2 F (40.1 C) (09/07 0831) Temp src: Rectal (09/07 0831) BP: 100/66 mmHg (09/07 1111) Pulse Rate: 104 (09/07 1111) Intake/Output from previous day:   Intake/Output from this shift:    Labs:  Recent Labs  08/23/13 0930  WBC 10.5  HGB 8.1*  PLT 180  CREATININE 1.52*   The CrCl is unknown because both a height and weight (above a minimum accepted value) are required for this calculation. No results found for this basename: VANCOTROUGH, VANCOPEAK, VANCORANDOM, GENTTROUGH, GENTPEAK, GENTRANDOM, TOBRATROUGH, TOBRAPEAK, TOBRARND, AMIKACINPEAK, AMIKACINTROU, AMIKACIN,  in the last 72 hours   Microbiology: No results found for this or any previous visit (from the past 720 hour(s)).  Medical History: Past Medical History  Diagnosis Date  . Hypertension   . GERD (gastroesophageal reflux disease)   . Small bowel obstruction 2013  . 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 Acute cholecystitis s/p perc draianage [id number] 12/20/2012  . Anemia of chronic disease 12/19/2012  . 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 MRSA bacteremia 01/04/2013  . SBO (small bowel obstruction) 11/07/2011  . Septic shock due to Staphylococcus aureus 12/24/2012  . Severe protein-calorie malnutrition 01/17/2013    Assessment: 68 yoM with hx of metastatic cancer presenting with SOB, confusion, and fever.  Received one-time doses of vancomycin 1g and Zosyn 3.375g in ED for suspected pneumonia.  9/7 CXR could not exclude pneumonia.  Patient will be admitted for further empiric treatment with vancomycin and cefepime x 8 days.  Of note,  patient receives TPN.  Tmax 104.2, WBC 10.5  SCr 1.52, CrCl ~54 ml/min/1.65m2 (normalized)  Last documented weight 84.6 kg as of 04/2013  Blood cultures x 2 ordered, appears that patient would only allow blood draw from PICC.  Goal of Therapy:  Vancomycin trough level 15-20 mcg/ml  Plan:   Vancomycin 750 mg IV q12h.    No dose adjustments to Cefepime 1g IV q8h.  F/u SCr, trough level, culture results.  Clance Boll 08/23/2013,11:16 AM

## 2013-08-23 NOTE — ED Notes (Signed)
Per MD Pickering, one set of blood cultures can be drawn from picc line. Pt refusing to be stuck for labs

## 2013-08-23 NOTE — ED Notes (Signed)
tpn going through pt's picc line. Has dilaudid pain pump

## 2013-08-23 NOTE — ED Notes (Signed)
Bed: WA14 Expected date:  Expected time:  Means of arrival:  Comments: SOB 

## 2013-08-24 ENCOUNTER — Encounter (HOSPITAL_COMMUNITY): Payer: Self-pay

## 2013-08-24 ENCOUNTER — Inpatient Hospital Stay (HOSPITAL_COMMUNITY): Payer: 59

## 2013-08-24 DIAGNOSIS — J9600 Acute respiratory failure, unspecified whether with hypoxia or hypercapnia: Secondary | ICD-10-CM

## 2013-08-24 DIAGNOSIS — J189 Pneumonia, unspecified organism: Secondary | ICD-10-CM | POA: Diagnosis present

## 2013-08-24 DIAGNOSIS — A419 Sepsis, unspecified organism: Secondary | ICD-10-CM | POA: Diagnosis present

## 2013-08-24 DIAGNOSIS — N179 Acute kidney failure, unspecified: Secondary | ICD-10-CM

## 2013-08-24 DIAGNOSIS — C762 Malignant neoplasm of abdomen: Secondary | ICD-10-CM

## 2013-08-24 DIAGNOSIS — D638 Anemia in other chronic diseases classified elsewhere: Secondary | ICD-10-CM

## 2013-08-24 LAB — COMPREHENSIVE METABOLIC PANEL
Albumin: 1.9 g/dL — ABNORMAL LOW (ref 3.5–5.2)
Alkaline Phosphatase: 165 U/L — ABNORMAL HIGH (ref 39–117)
BUN: 37 mg/dL — ABNORMAL HIGH (ref 6–23)
Calcium: 9.1 mg/dL (ref 8.4–10.5)
Potassium: 4.4 mEq/L (ref 3.5–5.1)
Total Protein: 6.7 g/dL (ref 6.0–8.3)

## 2013-08-24 LAB — URINE CULTURE
Colony Count: NO GROWTH
Culture: NO GROWTH

## 2013-08-24 LAB — CBC
HCT: 22.5 % — ABNORMAL LOW (ref 39.0–52.0)
Hemoglobin: 7.2 g/dL — ABNORMAL LOW (ref 13.0–17.0)
MCV: 92.6 fL (ref 78.0–100.0)
RBC: 2.43 MIL/uL — ABNORMAL LOW (ref 4.22–5.81)
RDW: 16.3 % — ABNORMAL HIGH (ref 11.5–15.5)
WBC: 10 10*3/uL (ref 4.0–10.5)

## 2013-08-24 LAB — LIPASE, BLOOD: Lipase: 38 U/L (ref 11–59)

## 2013-08-24 LAB — PRO B NATRIURETIC PEPTIDE: Pro B Natriuretic peptide (BNP): 5661 pg/mL — ABNORMAL HIGH (ref 0–125)

## 2013-08-24 LAB — LACTIC ACID, PLASMA: Lactic Acid, Venous: 0.9 mmol/L (ref 0.5–2.2)

## 2013-08-24 LAB — AMYLASE: Amylase: 39 U/L (ref 0–105)

## 2013-08-24 LAB — PROCALCITONIN: Procalcitonin: 5.95 ng/mL

## 2013-08-24 MED ORDER — METRONIDAZOLE IN NACL 5-0.79 MG/ML-% IV SOLN
500.0000 mg | Freq: Four times a day (QID) | INTRAVENOUS | Status: DC
  Administered 2013-08-24 – 2013-08-26 (×8): 500 mg via INTRAVENOUS
  Filled 2013-08-24 (×10): qty 100

## 2013-08-24 MED ORDER — SODIUM CHLORIDE 0.9 % IV SOLN
100.0000 mg | Freq: Every day | INTRAVENOUS | Status: DC
  Administered 2013-08-24 – 2013-09-02 (×10): 100 mg via INTRAVENOUS
  Filled 2013-08-24 (×11): qty 100

## 2013-08-24 MED ORDER — SODIUM CHLORIDE 0.45 % IV SOLN
INTRAVENOUS | Status: DC
  Administered 2013-08-24 – 2013-08-25 (×2): via INTRAVENOUS

## 2013-08-24 MED ORDER — PROMETHAZINE HCL 25 MG/ML IJ SOLN
INTRAMUSCULAR | Status: AC
  Filled 2013-08-24: qty 1

## 2013-08-24 MED ORDER — VANCOMYCIN HCL 10 G IV SOLR
1250.0000 mg | Freq: Two times a day (BID) | INTRAVENOUS | Status: DC
  Administered 2013-08-24 – 2013-08-25 (×4): 1250 mg via INTRAVENOUS
  Filled 2013-08-24 (×5): qty 1250

## 2013-08-24 NOTE — Progress Notes (Signed)
[facility])  Hospice Chaplain Note  Patient (pt): [name]  Room: 1227  First hospice homecare chaplain visit to assess for spiritual needs and to meet pt and family.  Pt was asleep and no family members were at bedside.  Chaplain spoke with RN and learned family had been present earlier.  Chaplain left a card with note for pt and family on bedside table.  Chaplain will follow and assess for spiritual support needs.  Beverly Isley-Landreth ThM, [facility] Clinical Chaplain

## 2013-08-24 NOTE — Progress Notes (Addendum)
   CARE MANAGEMENT NOTE 08/24/2013  Patient:  KOJI, NIEHOFF   Account Number:  [id number]  Date Initiated:  08/24/2013  Documentation initiated by:  DAVIS,RHONDA  Subjective/Objective Assessment:   PT WITH EXTENSIVE HX OF MULTIPLE AREA OF CA AND GASTROOMTY TUBE AND MULTIPLE DRAINS.     Action/Plan:   TBD, PT HAD BEEN AT [facility]  BUT CAME FROM HOME,  Ed reported there was some concern over state or unkept condition on admission,   Anticipated DC Date:  08/27/2013   Anticipated DC Plan:  HOME W HOSPICE CARE  In-house referral  Clinical Social Worker  Hospice / Palliative Care      DC Planning Services  NA  NA      [facility] Choice  NA   Choice offered to / List presented to:  NA   DME arranged  NA      DME agency  NA     HH arranged  NA      HH agency  NA   Status of service:  In process, will continue to follow Medicare Important Message given?  NA - LOS <3 / Initial given by admissions (If response is "NO", the following Medicare IM given date fields will be blank) Date Medicare IM given:   Date Additional Medicare IM given:    Discharge Disposition:    Per UR Regulation:  Reviewed for med. necessity/level of care/duration of stay  If discussed at Long Length of Stay Meetings, dates discussed:    Comments:  [id number]/Rhonda Stark Jock, BSN, [place] [phone] Chart Reviewed for discharge and hospital needs. Discharge needs at time of review:  None Review of patient progress due on [id number].

## 2013-08-24 NOTE — Consult Note (Signed)
WOC ostomy consult: Patient known to me from previous admissions.  Recognizes me as Geneticist, molecular. Stoma type/location: RLQ ileostomy, midline upper quadrant fistula Stomal assessment/size: Pouches not removed today as they are both intact. Systems have not been problematic as output is minimal. Peristomal assessment: Pouching systems not removed today, so no assessment conducted Treatment options for stomal/peristomal skin: None indicated. Output: None in ileostomy pouch.  Fistula has clear serous output and is attached to low intermittent suction with minimal exudate. Ostomy pouching: 1pc.  1pc pouching system in place over RLQ ileostomy; intact. Pouch Lawson# is [phone]. I will plan a pouch change this week using the same system and a skin barrier ring (Lawson# [phone]). Eakin Pouch (Medium, Lawson# [id number]) intact; supply ordered to bedside.  I will not follow routinely, but will see again this week and as needed to support this patient, his wife, his nursing and medical teams.  Please re-consult if needed in-between visits. Thanks, Ladona Mow, MSN, RN, GNP, [place], CWON-AP [phone])

## 2013-08-24 NOTE — Evaluation (Signed)
Physical Therapy Evaluation Patient Details Name: [name] MRN: [id number] DOB: [birth year] Today's Date: 08/24/2013 Time: 1000-1017 PT Time Calculation (min): 17 min  PT Assessment / Plan / Recommendation History of Present Illness  57 y.o. male with complicated medical problems including metastatic adenocarcinoma, peritoneal carcinomatosis status post extensive debulking procedures and abdominal surgeries by Dr. [name] which has left him with multiple drains and fistulas, on chronic TPN via a PICC line, chronic PEG for gastric decompression, history of small bowel obstruction status post jejunal stent, ileostomy, cholecystitis status post cholecystostomy and chronic indwelling drain. He presented to [facility] ED with main concern of progressively worsening shortness of breath, present at rest and with minimal exertion, associated with subjective fevers, chills, malaise, started several days prior to the admission and with no specific alleviating factors. Pt is currently under hospice care but is full code. He has pleurex cath placed for chronic symptoms management and explains that has been helping him. He denies any specific focal neurological symptoms but explains he is tired and feels week. He denies any specific abdominal or urinary concerns.   Clinical Impression  Pt will benefit from PT in acute setting to decrease burden of care; Pt wants to do more this session but limited to elevated RR, up to 44 with rolling and scooting to [facility], speaking.    PT Assessment  Patient needs continued PT services    Follow Up Recommendations  SNF (VS HHPT with continued 24*care)    Does the patient have the potential to tolerate intense rehabilitation      Barriers to Discharge        Equipment Recommendations  None recommended by PT    Recommendations for Other Services     Frequency Min 3X/week    Precautions / Restrictions Precautions Precautions: Fall Restrictions Weight Bearing  Restrictions: No   Pertinent Vitals/Pain Denies pain      Mobility  Bed Mobility Bed Mobility: Rolling Right;Rolling Left Rolling Right: 3: Mod assist;With rail Rolling Left: 3: Mod assist;With rail Details for Bed Mobility Assistance: cues for technique Scoots to [facility] with +2 total assist, pt= 30% Transfers Transfers: Not assessed    Exercises General Exercises - Lower Extremity Ankle Circles/Pumps: AROM;5 reps;Both Quad Sets: Both;5 reps;AROM   PT Diagnosis: Generalized weakness  PT Problem List: Decreased strength;Decreased range of motion;Decreased activity tolerance;Decreased mobility PT Treatment Interventions: DME instruction;Gait training;Functional mobility training;Therapeutic activities;Therapeutic exercise;Patient/family education;Balance training     PT Goals(Current goals can be found in the care plan section) Acute Rehab PT Goals PT Goal Formulation: With patient Time For Goal Achievement: 09/07/13 Potential to Achieve Goals: Poor  Visit Information  Last PT Received On: 08/24/13 Assistance Needed: +2 PT/OT Co-Evaluation/Treatment: Yes History of Present Illness: 57 y.o. male with complicated medical problems including metastatic adenocarcinoma, peritoneal carcinomatosis status post extensive debulking procedures and abdominal surgeries by Dr. [name] which has left him with multiple drains and fistulas, on chronic TPN via a PICC line, chronic PEG for gastric decompression, history of small bowel obstruction status post jejunal stent, ileostomy, cholecystitis status post cholecystostomy and chronic indwelling drain. He presented to [facility] ED with main concern of progressively worsening shortness of breath, present at rest and with minimal exertion, associated with subjective fevers, chills, malaise, started several days prior to the admission and with no specific alleviating factors. Pt is currently under hospice care but is full code. He has pleurex cath placed for  chronic symptoms management and explains that has been helping him. He denies any  specific focal neurological symptoms but explains he is tired and feels week. He denies any specific abdominal or urinary concerns.        Prior Functioning  Home Living Family/patient expects to be discharged to:: Skilled nursing facility Additional Comments: Pt states he was home for 3 days before coming back to hospital Prior Function Level of Independence: Needs assistance Comments: Pt did not specify how much assistance he needed. Communication Communication: Expressive difficulties;Other (comment)    Cognition  Cognition Arousal/Alertness: Awake/alert Behavior During Therapy: WFL for tasks assessed/performed Overall Cognitive Status: Within Functional Limits for tasks assessed    Extremity/Trunk Assessment Upper Extremity Assessment Upper Extremity Assessment: Overall WFL for tasks assessed Lower Extremity Assessment Lower Extremity Assessment: Generalized weakness   Balance    End of Session PT - End of Session Activity Tolerance: Treatment limited secondary to medical complications (Comment) (RR 44) Patient left: with call bell/phone within reach;in bed Nurse Communication: Other (comment) (RR)  GP     [facility] 08/24/2013, 10:43 AM

## 2013-08-24 NOTE — Consult Note (Signed)
PULMONARY  / CRITICAL CARE MEDICINE  Name: BOHDEN DUNG MRN: [id number] DOB: 1956-05-26    ADMISSION DATE:  08/23/2013 CONSULTATION DATE:  9/8  REFERRING MD :  Elisabeth Pigeon  PRIMARY SERVICE: [facility]   CHIEF COMPLAINT:  Severe sepsis and respiratory distress   BRIEF PATIENT DESCRIPTION:  57 y.o. male w/ h/o  metastatic adenocarcinoma, peritoneal carcinomatosis status post exhaustive chemo (not well enough to receive for > 9 mo now)  extensive debulking procedures and abdominal surgeries  which has left him with multiple drains and fistulas, on chronic TPN via a PICC line, chronic PEG for gastric decompression, history of small bowel obstruction status post jejunal stent, ileostomy, cholecystitis status post cholecystostomy and chronic indwelling drain as well as chronic pleurex cath for chronic effusion (? Malignant). Admitted on 9/7 w/ fever, dyspnea and CXR c/w PNA. PCCM asked to see on 9/8 for sepsis and resp failure.   SIGNIFICANT EVENTS / STUDIES:    LINES / TUBES: PICC (chronic) remove 9/8  CULTURES: MRSA screen 9/7: positive  BCX2 9/7: yeast >>>  ANTIBIOTICS: Cefepime 9/8>>> Flagyl 9/8>>> vanc 9/8>>> mycafungin 9/8>>>   HISTORY OF PRESENT ILLNESS:   57 y.o. male with complicated medical problems including metastatic adenocarcinoma, peritoneal carcinomatosis status post extensive debulking procedures and abdominal surgeries by Dr. [name] which has left him with multiple drains and fistulas, on chronic TPN via a PICC line, chronic PEG for gastric decompression, history of small bowel obstruction status post jejunal stent, ileostomy, cholecystitis status post cholecystostomy and chronic indwelling drain. He presented to [facility] ED on 9/7 with cc: of progressively worsening shortness of breath, present at rest and with minimal exertion, associated with subjective fevers, chills, malaise, started several days prior to the admission and with no specific alleviating factors. He has pleurex cath  placed for chronic symptoms management and explains that has been helping him. In ED, pt found to be hypoxic with O2 saturation in 80's on RA that has quickly improved with oxygen via [place] to low 90's, Tmax 104 F in ED, and CXR findings were suggestive of possible multilobar PNA with ? Vascular congestion. TRH asked to admit for further evaluation and management in SDU. PCCm asked to see on 9/8 for increased WOB and concern for sepsis.    PAST MEDICAL HISTORY :  Past Medical History  Diagnosis Date  . Hypertension   . GERD (gastroesophageal reflux disease)   . Small bowel obstruction 2013  . 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 Acute cholecystitis s/p perc draianage [id number] 12/20/2012  . Anemia of chronic disease 12/19/2012  . 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 MRSA bacteremia 01/04/2013  . SBO (small bowel obstruction) 11/07/2011  . Septic shock due to Staphylococcus aureus 12/24/2012  . Severe protein-calorie malnutrition 01/17/2013  . Hospice care patient    Past Surgical History  Procedure Laterality Date  . Tonsillectomy    . Esophagogastroduodenoscopy  12/11/2012    Procedure: ESOPHAGOGASTRODUODENOSCOPY (EGD);  Surgeon: Louis Meckel, MD;  Location: [facility] ENDOSCOPY;  Service: Endoscopy;  Laterality: N/A;  . Duodenal stent placement  12/11/2012    Procedure: DUODENAL STENT PLACEMENT;  Surgeon: Louis Meckel, MD;  Location: [facility] ENDOSCOPY;  Service: Endoscopy;  Laterality: N/A;  . Esophagogastroduodenoscopy  12/13/2012    Procedure: ESOPHAGOGASTRODUODENOSCOPY (EGD);  Surgeon: Louis Meckel, MD;  Location: [facility] OR;  Service: Endoscopy;  Laterality: N/A;  with attempt  to stent duodenal stricture  . Peg placement  12/12/2012    Procedure: PERCUTANEOUS ENDOSCOPIC GASTROSTOMY (PEG) PLACEMENT;  Surgeon: Louis Meckel, MD;  Location: [facility] ENDOSCOPY;  Service: Endoscopy;  Laterality: N/A;  . Small  intestine surgery  jan 2013    [facility]  SB resection, ileostomy, gastrostomy  . Debulking  sep 2013    [facility]  ileal SB resection, debulking of peritoneal carcinomatosis, intraperitoneal chemotherapy  . Colon surgery  oct 2013    [facility] transverse colon perforation  . Enteroscopy  12/30/2012    Procedure: ENTEROSCOPY;  Surgeon: Louis Meckel, MD;  Location: [facility] ENDOSCOPY;  Service: Endoscopy;  Laterality: N/A;  With possible stent placement; please have duodenal stent available  . Duodenal stent placement  12/30/2012    Procedure: DUODENAL STENT PLACEMENT;  Surgeon: Louis Meckel, MD;  Location: [facility] ENDOSCOPY;  Service: Endoscopy;  Laterality: N/A;   Prior to Admission medications   Medication Sig Start Date End Date Taking? Authorizing Provider  fat emulsion 20 % infusion Inject 250 mLs into the vein daily. Intralipid Excel Container 50gm . Off at 8am and on at 8pm.   Yes Historical Provider, MD  fentaNYL (DURAGESIC - DOSED MCG/HR) 100 MCG/HR Place 1 patch onto the skin every 3 (three) days.   Yes Historical Provider, MD  Injection Device (PCA INJECTOR) MISC Inject into the vein See admin instructions. Hydromorphone HCL SDV 10mg /40ml vial. dilauded PCA: final concentration 1mg /ml:1mg /hr basal rate May have 1mg  bolus every 1 hour as needed for pain   Yes Historical Provider, MD  ondansetron (ZOFRAN) 4 MG/2ML SOLN injection Inject 4 mg into the vein every 6 (six) hours as needed for nausea. Inject IV - Push every 6 hours as needed   Yes Historical Provider, MD  pantoprazole (PROTONIX) 40 MG injection Inject 40 mg into the vein every morning. Infuse 40ml over 60 minutes   Yes Historical Provider, MD  promethazine (PHENERGAN) 25 MG/ML injection Inject 25 mg into the vein every 6 (six) hours as needed for nausea or vomiting.   Yes Historical Provider, MD  sodium chloride 0.45 % solution Inject 200 mLs into the vein continuous. Sodium chloride 1000 n/s. Infuse 0.45% NS intravenously at  228ml/hr daily   Yes Historical Provider, MD  TPN ADULT Inject into the vein continuous. infuvite 200-150/10 vial. Add 10 MV to TPN just prior to hanging every day. Infuse over 20 hours. Start at 1800-1900. [facility] [phone]   Yes Historical Provider, MD   Allergies  Allergen Reactions  . Percocet [Oxycodone-Acetaminophen] Nausea And Vomiting    hallucination    FAMILY HISTORY:  Family History  Problem Relation Age of Onset  . Lung cancer Brother   . Hypertension Mother   . Hypertension Father   . Heart failure Father    SOCIAL HISTORY:  reports that he has never smoked. He has never used smokeless tobacco. He reports that he does not drink alcohol or use illicit drugs.  REVIEW OF SYSTEMS:   Review of Systems  Constitutional: Positive for fever, chills, weight loss and malaise/fatigue.  HENT: Negative for congestion and sore throat.   Respiratory: Positive for cough, sputum production, shortness of breath and wheezing. Negative for hemoptysis.   Cardiovascular: Positive for leg swelling. Negative for chest pain, palpitations and orthopnea.  Gastrointestinal: Negative for heartburn, nausea, vomiting and abdominal pain.  Genitourinary: Negative.   Musculoskeletal: Negative.   Neurological: Positive for weakness. Negative for headaches.  Endo/Heme/Allergies: Negative for environmental allergies. Does not  bruise/bleed easily.  Psychiatric/Behavioral: Negative.      SUBJECTIVE:  No acute distress  VITAL SIGNS: Temp:  [97.9 F (36.6 C)-99.1 F (37.3 C)] 97.9 F (36.6 C) (09/08 0400) Pulse Rate:  [69-104] 78 (09/08 1000) Resp:  [18-44] 44 (09/08 1010) BP: (80-133)/(50-98) 99/74 mmHg (09/08 1000) SpO2:  [94 %-100 %] 100 % (09/08 1010) Weight:  [105.6 kg (232 lb 12.9 oz)] 105.6 kg (232 lb 12.9 oz) (09/07 1200) HEMODYNAMICS:   VENTILATOR SETTINGS:   INTAKE / OUTPUT: Intake/Output     09/07 0701 - 09/08 0700 09/08 0701 - 09/09 0700   I.V. (mL/kg) 484 (4.6) 8  (0.1)   Other 240    IV Piggyback 350 50   Total Intake(mL/kg) 1074 (10.2) 58 (0.5)   Urine (mL/kg/hr) 960    Drains 940    Stool 665    Chest Tube 5    Total Output 2570     Net -1496 +58          PHYSICAL EXAMINATION: General:  Chronically ill appearing AAM not in acute distress.  Neuro:  Awake, generalized weakness. Poor memory  HEENT:  Neck veins flat  Cardiovascular:  rrr Lungs:  Scattered rhonchi/ wheeze, right pleurex drain w/ exudative appearing output  Abdomen:  Mid abd ostomy bags draining.  Musculoskeletal:  Generalized weakness  Skin:  Edema   LABS:  CBC Recent Labs     08/23/13  0930  08/24/13  0625  WBC  10.5  10.0  HGB  8.1*  7.2*  HCT  25.7*  22.5*  PLT  180  162   Coag's Recent Labs     08/23/13  1425  APTT  37  INR  1.71*   BMET Recent Labs     08/23/13  0930  08/24/13  0625  NA  139  141  K  4.0  4.4  CL  113*  114*  CO2  19  19  BUN  40*  37*  CREATININE  1.52*  1.34  GLUCOSE  99  85   Electrolytes Recent Labs     08/23/13  0930  08/23/13  1425  08/24/13  0625  CALCIUM  8.8   --   9.1  MG   --   2.4   --   PHOS   --   2.6   --    Sepsis Markers No results found for this basename: LACTICACIDVEN, PROCALCITON, O2SATVEN,  in the last 72 hours ABG No results found for this basename: PHART, PCO2ART, PO2ART,  in the last 72 hours Liver Enzymes Recent Labs     08/23/13  0930  08/24/13  0625  AST  37  34  ALT  49  43  ALKPHOS  208*  165*  BILITOT  2.2*  2.2*  ALBUMIN  2.0*  1.9*   Cardiac Enzymes Recent Labs     08/23/13  0930  PROBNP  9174.0*   Glucose Recent Labs     08/24/13  0756  GLUCAP  85    Imaging Dg Chest Port 1 View  08/23/2013   *RADIOLOGY REPORT*  Clinical Data: Shortness of breath.  Fever.  PORTABLE CHEST - 1 VIEW  Comparison: Chest x-ray 04/03/2013.  Findings: Lung volumes are low.  Bibasilar opacities may reflect areas of atelectasis and/or consolidation.  Probable small bilateral pleural  effusions.  Right-sided chest tube in place with tip and sideport projecting over the right hemithorax. There is a left upper extremity PICC with tip  terminating in the proximal superior vena cava. Marked enlargement of the cardiopericardial silhouette. The patient is rotated to the right on today's exam, resulting in distortion of the mediastinal contours and reduced diagnostic sensitivity and specificity for mediastinal pathology. Cephalization of the pulmonary vasculature with indistinctness of the interstitial markings which may suggest mild interstitial pulmonary edema.  IMPRESSION: 1.  Support apparatus, as above. 2.  The appearance of the chest suggests mild congestive heart failure, however, multilobar pneumonia is difficult to exclude. Clinical correlation is recommended. 3.  Small bilateral pleural effusions.   Original Report Authenticated By: Trudie Reed, M.D.     CXR: vasc congestion vs multi-lobar PNA   ASSESSMENT / PLAN:  PULMONARY A:  PNA (high risk aspiration, res organism) Respiratory distress  Chronic pleural effusions States that his pleural effusion cytology was negative but we do not have confirmatory labs.  Worried that he could have progression of his cancer and that this may be a post-obstructive process.   P:   Supplemental oxygen HCAP coverage CT chest to r/o tumor bnp further  CARDIOVASCULAR A:  Severe sepsis (see ID section) P:  Not a candidate for EGDT protocol Lactic assessment consideration Pressors would NOT change his outcome See ID section If new picc / line,w ould cvp Echo re assessment, also assess volume status  RENAL A:   Hyperchloremia  Early NAG metabolic acidosis P:   Change IVF to 1/2 ns bnp F/u chemistry   GASTROINTESTINAL A:   Metastatic adenocarcinoma, peritoneal carcinomatosis status post extensive debulking procedures and abdominal surgeries  multiple drains and fistulas, on chronic TPN via a PICC line, chronic PEG for  gastric decompression, history of small bowel obstruction status post jejunal stent, ileostomy, cholecystitis status post cholecystostomy and chronic indwelling drain. P:   Cont NPO status TPn will need to be held  Give longer holiday without TPN  HEMATOLOGIC A:   Metastatic adenocarcinoma Has not had chemo in > 9 mo. Not a candidate for further chemo. Concerned that his cancer has progressed.  Anemia of chronic disease  P:  Transfuse for hgb <7, doubt will change outcome CT chest-->may help some w/ prognostics  Needs palliative care   INFECTIOUS A:   Fungemia-->chronic PICC the likely source  Pneumonia -->concerned about post-obstructive process.  P:   Dc picc STAT Can't have central access for at least 48hrs if not longer as goal Added antifungal STAT Broaden for anaerobic coverage, added flagyl for anaerobic coverage for asp concern and post obstructive    ENDOCRINE A:  No acute issue  P:   Trend glucose   NEUROLOGIC A:  No acute issue  P:   Supportive care   TODAY'S SUMMARY:  57 year old male w/ metastatic adenocarcinoma s/p multiple debulking procedures. Has been chronically ill and not strong enough for cancer treatment for over 9 months. Now septic w/ fungemia and possibly post-obstructive PNA. He is not a candidate for aggressive measures. Needs palliative care. Have been very up-front with him that this is a very poor prognosis and that if things were to worsen critical care level support and life support would ensure potential suffering but not bring about improvement in his underlying illness. Have encouraged him to talk open and up-front with his family re: advanced directives.  Aggressive medical therapy, vent, acls , hd, pressors would be MEDICALLY INEFFECTIVE therapy  I have personally obtained a history, examined the patient, evaluated laboratory and imaging results, formulated the assessment and plan and placed orders. CRITICAL CARE:  The patient is critically  ill with multiple organ systems failure and requires high complexity decision making for assessment and support, frequent evaluation and titration of therapies, application of advanced monitoring technologies and extensive interpretation of multiple databases. Critical Care Time devoted to patient care services described in this note is 35 minutes.   Mcarthur Rossetti. Tyson Alias, MD, FACP Pgr: [phone] [place] [facility]  Pulmonary and Critical Care Medicine [facility] Pager: [phone]  08/24/2013, 11:00 AM

## 2013-08-24 NOTE — Progress Notes (Signed)
Room [facility] 1227 - Cecile Hearing - [facility] RN Visit-R.Yuma Blucher RN  NON-COVERED ADMISSION due to private [facility] insurance (age 57yo)- related to [facility] diagnosis of malignancy.  Pt is FULL code per hospital but pt's OOF DNR is on shadow chart. Pt was just readmitted to hospice services 08/19/2013. Pt arousable, oriented, lying in bed, with complaints of shortness of breath - pt unable to complete 2 words without getting a breath in between.  Denies pain at this time.     No family present. Patient's home medication list is on shadow chart.    Per various notes in EPIC, pt admitted with confusion, fever 104, elevated WBC, anemia, CXR reveals multilobar PNA-SOB-acute respiratory failure with hypoxia-02 Sats 82% en ED.  Pt MRSA positive, on Vanc for recurrent bacteremia.  PICC line changed 8/8 and transthoracic echocardiogram showed no vegetation.  R/sided Pleurix drain in place,  Ilestomy bag to drainage,  EC fistula  attached to low wall suction.  PEG in place ? For venting.  TPN dependent. Dilaudid PCA @ 1mg  per hour.  Please call [facility] @ [phone]- ask for RN Liaison or after hours,ask for on-call RN with any hospice needs.   Thank you.  [name], RN  [facility]  Hospice Liaison  [phone])

## 2013-08-24 NOTE — Progress Notes (Signed)
*  PRELIMINARY RESULTS* Echocardiogram 2D Echocardiogram has been performed.  [name] 08/24/2013, 3:47 PM

## 2013-08-24 NOTE — Progress Notes (Addendum)
[facility] PROGRESS NOTE  [name]:[id number] DOB: Sep 15, 1956 DOA: 08/23/2013 PCP: [name], MD  Brief narrative: 57 y.o. male with complicated medical problems including but not limited to metastatic adenocarcinoma, peritoneal carcinomatosis status post extensive debulking procedures and abdominal surgeries by Dr. [name] which has left him with multiple drains,  fistulas, on chronic TPN, chronic PEG for gastric decompression, history of small bowel obstruction status post jejunal stent, ileostomy, cholecystitis status post cholecystostomy and chronic indwelling drain. Patient presented to [facility] ED 08/23/2013 with main concern of progressively worsening shortness of breath associated with subjective fevers, chills, malaise, started several days prior to this admission. Pt is currently under hospice care but is full code. Patient has pleurex cath placed for chronic symptoms management.  In ED, patient was found to be hypoxic with O2 saturation in 80's on RA that has quickly improved with oxygen via [place] to low 90's, Tmax 104 F in ED, and CXR findings were suggestive of possible multilobar PNA with possible vascular congestion.   Assessment and Plan:   Principal Problem:  *Acute respiratory failure with hypoxia  - Like multifactorial secondary to pneumonia, pleural effusion, possible malignant pleural effusion - Initial oxygen saturation is 82% on room air but now saturating 100% on nasal cannula. The patient is short of breath and tachypneic this morning. Appreciate PCCM consult and recommendations - Chest x-ray done on the admission was significant for possible multilobar pneumonia and vascular congestion - Patient was started on empiric antibiotics, vancomycin and Zosyn - Blood cultures on the admission are growing yeast. Fluconazole started but will stop it and use Micafungin until final blood culture results available. - Can continue using albuterol and Atrovent every 6 hours as  needed for shortness of breath or wheezing - Pleurx catheter in place  *HCAP - vancomycin and cefepime started empirically - follow up final results of blood cultures - strep pneumonia negative; legionella pending  Active Problems:  Fever, Sepsis - most likely secondary to PNA and fungemia - check procalcitonin level - Management as above with vancomycin, cefepime and Micafungin Abdominal malignant neoplasm, Metastatic adenocarcinoma, Peritoneal carcinomatosis  - currently under hospice care due to advancing adenocarcinoma  - pt looks chronically ill and tired, barely able to speak and per wife this is his baseline  - supportive care and ensuring comfort but pt is full ocde  Anemia of chronic disease  - Hemoglobin was 8.1 on the admission and this morning 7.2 Acute renal failure  - Cr in 04/2013 was ~ 1.7  - Admission creatinine 1.52 and this morning within normal limits Severe protein-calorie malnutrition  - secondary to progressive malignant process and failure to thrive  - nutrition consult to resume TPN  Oral thrush  - Will stop fluconazole and start micafungin 100 mg daily which will cover for oral thrush and fungemia - continue magic mouthwash  Code Status: Full  Family Communication: no family at bedside Disposition Plan: home when stable   [name], MD  [facility] Pager [phone]  If 7PM-7AM, please contact night-coverage [email or web address] Password [email or web address] 08/24/2013, 10:39 AM   LOS: 1 day   Consultants:  PCCM  Procedures:  None   Antibiotics:  Vancomycin 08/23/2013 -->  Cefepime 08/23/2013 -->  Fluconazole 08/23/2013 --> 08/24/2013  Micafungin 08/24/2013 -->  HPI/Subjective: Short of breath. No events overnight.  Objective: Filed Vitals:   08/24/13 0800 08/24/13 0900 08/24/13 1000 08/24/13 1010  BP: 94/61  99/74   Pulse: 87 84 78   Temp:  TempSrc:      Resp: 31 26 26  44  Height:      Weight:      SpO2: 100% 100% 100% 100%     Intake/Output Summary (Last 24 hours) at 08/24/13 1039 Last data filed at 08/24/13 1000  Gross per 24 hour  Intake   1132 ml  Output   2570 ml  Net  -1438 ml    Exam:   General:  Pt is alert, follows commands appropriately, not in acute distress  Cardiovascular: Regular rate and rhythm, S1/S2 appreciated  Respiratory: Coarse breath sounds in upper lung lobes, pleural drain in place  Abdomen: Soft, non tender, non distended, bowel sounds present, no guarding; patient has PEG tube, fistula and ileostomy in place  Extremities: No edema, pulses DP and PT palpable bilaterally  Neuro: Grossly nonfocal  Data Reviewed: Basic Metabolic Panel:  Recent Labs Lab 08/23/13 0930 08/23/13 1425 08/24/13 0625  NA 139  --  141  K 4.0  --  4.4  CL 113*  --  114*  CO2 19  --  19  GLUCOSE 99  --  85  BUN 40*  --  37*  CREATININE 1.52*  --  1.34  CALCIUM 8.8  --  9.1  MG  --  2.4  --   PHOS  --  2.6  --    Liver Function Tests:  Recent Labs Lab 08/23/13 0930 08/24/13 0625  AST 37 34  ALT 49 43  ALKPHOS 208* 165*  BILITOT 2.2* 2.2*  PROT 7.1 6.7  ALBUMIN 2.0* 1.9*   No results found for this basename: LIPASE, AMYLASE,  in the last 168 hours No results found for this basename: AMMONIA,  in the last 168 hours CBC:  Recent Labs Lab 08/23/13 0930 08/24/13 0625  WBC 10.5 10.0  NEUTROABS 8.3*  --   HGB 8.1* 7.2*  HCT 25.7* 22.5*  MCV 93.1 92.6  PLT 180 162   Cardiac Enzymes: No results found for this basename: CKTOTAL, CKMB, CKMBINDEX, TROPONINI,  in the last 168 hours BNP: No components found with this basename: POCBNP,  CBG:  Recent Labs Lab 08/24/13 0756  GLUCAP 85    CULTURE, BLOOD (ROUTINE X 2)     Status: None   Collection Time    08/23/13  9:30 AM      Result Value Range Status   Specimen Description BLOOD PICC LEFT ARM  5 ML IN [facility] BOTTLE   Final   Special Requests NONE   Final   Culture  Setup Time     Final   Value: 08/23/2013 16:16      Performed at [facility]   Culture     Final   Value: YEAST     Performed at [facility]   Report Status PENDING   Incomplete  MRSA PCR SCREENING     Status: Abnormal   Collection Time    08/23/13 11:52 AM      Result Value Range Status   MRSA by PCR POSITIVE (*) NEGATIVE Final     Studies: Dg Chest Port 1 View 08/23/2013   * IMPRESSION: 1.  Support apparatus, as above. 2.  The appearance of the chest suggests mild congestive heart failure, however, multilobar pneumonia is difficult to exclude. Clinical correlation is recommended. 3.  Small bilateral pleural effusions.      Scheduled Meds: . ceFEPime (MAXIPIME) IV  1 g Intravenous Q8H  . fentaNYL  100 mcg Transdermal Q72H  .  fluconazole (DIFLUCAN)   200 mg Intravenous Q24H  . mupirocin ointment  1 application Nasal BID  . pantoprazole  40 mg Intravenous q morning - 10a  . vancomycin  1,250 mg Intravenous Q12H   Continuous Infusions: . HYDROmorphone 1 mg/hr (08/23/13 1900)

## 2013-08-24 NOTE — Evaluation (Signed)
Occupational Therapy Evaluation Patient Details Name: [name] MRN: [id number] DOB: Sep 01, 1956 Today's Date: 08/24/2013 Time: 1000-1018 OT Time Calculation (min): 18 min  OT Assessment / Plan / Recommendation History of present illness 57 y.o. male with complicated medical problems including metastatic adenocarcinoma, peritoneal carcinomatosis status post extensive debulking procedures and abdominal surgeries by Dr. [name] which has left him with multiple drains and fistulas, on chronic TPN via a PICC line, chronic PEG for gastric decompression, history of small bowel obstruction status post jejunal stent, ileostomy, cholecystitis status post cholecystostomy and chronic indwelling drain. He presented to [facility] ED with main concern of progressively worsening shortness of breath, present at rest and with minimal exertion, associated with subjective fevers, chills, malaise, started several days prior to the admission and with no specific alleviating factors. Pt is currently under hospice care but is full code. He has pleurex cath placed for chronic symptoms management and explains that has been helping him. He denies any specific focal neurological symptoms but explains he is tired and feels week. He denies any specific abdominal or urinary concerns.    Clinical Impression   Pt presents to OT with significant decreased I with ADL activity due to signifciant shortness of breath and weakness.  Pt will benefit from skilled OT to increase I with ADL activity and return to PLOF    OT Assessment  Patient needs continued OT Services    Follow Up Recommendations  Home health OT;SNF;Supervision/Assistance - 24 hour;Other (comment)          Recommendations for Other Services  (depending on family assist)  Frequency  Min 2X/week    Precautions / Restrictions Precautions Precautions: Fall Restrictions Weight Bearing Restrictions: No       ADL  Transfers/Ambulation Related to ADLs: Nurse did report  pt did A with with brushing his teeth this am.  Pt with increased RR rate with BUe movement during therapy eval.  Explained to pt as breathing improved we would encourage pt to increase activity. Pt hard to understand due to SOB    OT Diagnosis: Generalized weakness  OT Problem List: Decreased strength;Decreased activity tolerance OT Treatment Interventions: Self-care/ADL training;Patient/family education;DME and/or AE instruction   OT Goals(Current goals can be found in the care plan section) Acute Rehab OT Goals OT Goal Formulation: With patient Time For Goal Achievement: 09/07/13 Potential to Achieve Goals: Fair ADL Goals Pt Will Perform Eating: with set-up;sitting Pt Will Perform Grooming: with supervision;sitting Additional ADL Goal #1: Pt will be able to sit EOB with min A  in prep for ADL actiivity  Visit Information  Last OT Received On: 08/24/13 Assistance Needed: +2 History of Present Illness: 57 y.o. male with complicated medical problems including metastatic adenocarcinoma, peritoneal carcinomatosis status post extensive debulking procedures and abdominal surgeries by Dr. [name] which has left him with multiple drains and fistulas, on chronic TPN via a PICC line, chronic PEG for gastric decompression, history of small bowel obstruction status post jejunal stent, ileostomy, cholecystitis status post cholecystostomy and chronic indwelling drain. He presented to [facility] ED with main concern of progressively worsening shortness of breath, present at rest and with minimal exertion, associated with subjective fevers, chills, malaise, started several days prior to the admission and with no specific alleviating factors. Pt is currently under hospice care but is full code. He has pleurex cath placed for chronic symptoms management and explains that has been helping him. He denies any specific focal neurological symptoms but explains he is tired and feels week. He  denies any specific abdominal or  urinary concerns.        Prior Functioning     Home Living Family/patient expects to be discharged to:: Skilled nursing facility Additional Comments: Pt states he was home for 3 days before coming back to hospital Prior Function Level of Independence: Needs assistance Comments: Pt did not specify how much assistance he needed. Communication Communication: Expressive difficulties;Other (comment)         Vision/Perception Vision - History Patient Visual Report: No change from baseline   Cognition  Cognition Arousal/Alertness: Awake/alert Behavior During Therapy: WFL for tasks assessed/performed Overall Cognitive Status: Within Functional Limits for tasks assessed    Extremity/Trunk Assessment Upper Extremity Assessment Upper Extremity Assessment: Overall WFL for tasks assessed Lower Extremity Assessment Lower Extremity Assessment: Generalized weakness     Mobility Bed Mobility Bed Mobility: Rolling Right;Rolling Left;Scooting to HOB Rolling Right: 3: Mod assist;With rail Rolling Left: 3: Mod assist;With rail Scooting to HOB: 1: +2 Total assist Scooting to [facility]: Patient Percentage: 30% Details for Bed Mobility Assistance: cues for technique Transfers Transfers: Not assessed     Exercise General Exercises - Lower Extremity Ankle Circles/Pumps: AROM;5 reps;Both Quad Sets: Both;5 reps;AROM      End of Session OT - End of Session Activity Tolerance: Treatment limited secondary to medical complications (Comment);Other (comment) (increased RR rate) Patient left: in bed Nurse Communication: Mobility status  GO     [name] 08/24/2013, 10:55 AM

## 2013-08-24 NOTE — Progress Notes (Signed)
PHARMACY CONSULT NOTE: VANCOMYCIN  Please see previous pharmacy progress notes from 9/7 for full details.  Day #2 Vancomycin 750mg  IV q12h and Cefepime 1g IV q8h for presumed aspiration PNA.  Day #1 Micafungin 100mg  IV q24h for yeast in blood from PICC Day #1 Flagyl 500mg  IV q6h for GI/anaerobic coverage  Tmax 99.1 WBC 10k SCr improved 1.34, CrCl 82 ml/min CG, 62 ml/min/1.94m2 (normalized)  Cultures: 9/7 blood (PICC): YEAST 9/7 urine: 9/7 mrsa pcr (+)  A/P: Since SCr has improved and actual weight is higher than previous admission, will increase vancomycin to 1250mg  IV q12h and check trough at steady state.  Loralee Pacas, PharmD, BCPS Pager: [phone] 08/24/2013 11:41 AM

## 2013-08-24 NOTE — Progress Notes (Signed)
INITIAL NUTRITION ASSESSMENT  DOCUMENTATION CODES Per approved criteria  -Not Applicable   INTERVENTION: Recommend starting TPN as soon as pt is able; See estimated nutritional needs below  NUTRITION DIAGNOSIS: Inadequate oral intake related to inability to eat as evidenced by NPO status.   Goal: Pt to meet >/= 90% of their estimated nutrition needs   Monitor:  TPN initiation/rate Weight Labs  Reason for Assessment: Consult for TF management  57 y.o. male  Admitting Dx: Acute respiratory failure with hypoxia  ASSESSMENT: 57 y.o. male with complicated medical problems including metastatic adenocarcinoma, peritoneal carcinomatosis status post extensive debulking procedures and abdominal surgeries by Dr. [name] which has left him with multiple drains and fistulas, on chronic TPN via a PICC line, chronic PEG for gastric decompression, history of small bowel obstruction status post jejunal stent, ileostomy, cholecystitis status post cholecystostomy and chronic indwelling drain. He presented to [facility] ED with main concern of progressively worsening shortness of breath, present at rest and with minimal exertion, associated with subjective fevers, chills, malaise, started several days prior to the admission and with no specific alleviating factors.  RD consulted for PO intake and TF management but, pt has PEG for suction and is on chronic TPN. RD familiar with pt from previous admissions. Pt reports that he was drinking water by mouth PTA and on TPN 24 hours per day. Pharmacist has pt's TPN order from PTA: Pt was receiving 2828 kcal and 132 grams daily. Pt reports he has been gaining weight steadily over the past few months. Per weight history pt has gained 46 lbs over the past 4 months. He states he was doing physical therapy and walking with a walker these past few months but, now he no longer walks.  Per multidisciplinary rounds today, pt was found to have fungus in his blood- PICC line needs to  be pulled and TPN will need to be held for at least 48 hours.   Height: Ht Readings from Last 1 Encounters:  08/23/13 6\' 4"  (1.93 m)    Weight: Wt Readings from Last 1 Encounters:  08/23/13 232 lb 12.9 oz (105.6 kg)    Ideal Body Weight: 202 lbs  % Ideal Body Weight: 115%  Wt Readings from Last 10 Encounters:  08/23/13 232 lb 12.9 oz (105.6 kg)  04/17/13 186 lb 8.2 oz (84.6 kg)  01/19/13 186 lb 4.6 oz (84.5 kg)  12/30/12 204 lb (92.534 kg)  12/30/12 204 lb (92.534 kg)  12/11/12 204 lb (92.534 kg)  12/11/12 204 lb (92.534 kg)  12/11/12 204 lb (92.534 kg)  12/11/12 204 lb (92.534 kg)  12/11/12 204 lb (92.534 kg)    Usual Body Weight: 170-180 lbs  % Usual Body Weight: 129%  BMI:  Body mass index is 28.35 kg/(m^2).  Estimated Nutritional Needs: Kcal: 2720-2930 Protein: 130-140 grams Fluid: 2.7-2.9 L/day  Skin: Generalized edema; open fistula, wound on buttocks, excoriated right buttocks  Diet Order:    EDUCATION NEEDS: -No education needs identified at this time   Intake/Output Summary (Last 24 hours) at 08/24/13 1222 Last data filed at 08/24/13 1100  Gross per 24 hour  Intake   1384 ml  Output   2770 ml  Net  -1386 ml    Last BM: 9/8; ilesotomy   Labs:   Recent Labs Lab 08/23/13 0930 08/23/13 1425 08/24/13 0625  NA 139  --  141  K 4.0  --  4.4  CL 113*  --  114*  CO2 19  --  19  BUN 40*  --  37*  CREATININE 1.52*  --  1.34  CALCIUM 8.8  --  9.1  MG  --  2.4  --   PHOS  --  2.6  --   GLUCOSE 99  --  85    CBG (last 3)   Recent Labs  08/24/13 0756  GLUCAP 85    Scheduled Meds: . ceFEPime (MAXIPIME) IV  1 g Intravenous Q8H  . Chlorhexidine Gluconate Cloth  6 each Topical Q0600  . fentaNYL  100 mcg Transdermal Q72H  . metronidazole  500 mg Intravenous Q6H  . micafungin (MYCAMINE) IV  100 mg Intravenous Daily  . mupirocin ointment  1 application Nasal BID  . pantoprazole  40 mg Intravenous q morning - 10a  . sodium chloride  3 mL  Intravenous Q12H  . vancomycin  1,250 mg Intravenous Q12H    Continuous Infusions: . sodium chloride 75 mL/hr at 08/24/13 1200  . HYDROmorphone 1 mg/hr (08/23/13 1900)    Past Medical History  Diagnosis Date  . Hypertension   . GERD (gastroesophageal reflux disease)   . Small bowel obstruction 2013  . 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 Acute cholecystitis s/p perc draianage [id number] 12/20/2012  . Anemia of chronic disease 12/19/2012  . 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 MRSA bacteremia 01/04/2013  . SBO (small bowel obstruction) 11/07/2011  . Septic shock due to Staphylococcus aureus 12/24/2012  . Severe protein-calorie malnutrition 01/17/2013  . Hospice care patient     Past Surgical History  Procedure Laterality Date  . Tonsillectomy    . Esophagogastroduodenoscopy  12/11/2012    Procedure: ESOPHAGOGASTRODUODENOSCOPY (EGD);  Surgeon: Louis Meckel, MD;  Location: [facility] ENDOSCOPY;  Service: Endoscopy;  Laterality: N/A;  . Duodenal stent placement  12/11/2012    Procedure: DUODENAL STENT PLACEMENT;  Surgeon: Louis Meckel, MD;  Location: [facility] ENDOSCOPY;  Service: Endoscopy;  Laterality: N/A;  . Esophagogastroduodenoscopy  12/13/2012    Procedure: ESOPHAGOGASTRODUODENOSCOPY (EGD);  Surgeon: Louis Meckel, MD;  Location: [facility] OR;  Service: Endoscopy;  Laterality: N/A;  with attempt to stent duodenal stricture  . Peg placement  12/12/2012    Procedure: PERCUTANEOUS ENDOSCOPIC GASTROSTOMY (PEG) PLACEMENT;  Surgeon: Louis Meckel, MD;  Location: [facility] ENDOSCOPY;  Service: Endoscopy;  Laterality: N/A;  . Small intestine surgery  jan 2013    [facility].  SB resection, ileostomy, gastrostomy  . Debulking  sep 2013    [facility].  ileal SB resection, debulking of peritoneal carcinomatosis, intraperitoneal chemotherapy  . Colon surgery  oct 2013    [facility] transverse colon perforation  . Enteroscopy   12/30/2012    Procedure: ENTEROSCOPY;  Surgeon: Louis Meckel, MD;  Location: [facility] ENDOSCOPY;  Service: Endoscopy;  Laterality: N/A;  With possible stent placement; please have duodenal stent available  . Duodenal stent placement  12/30/2012    Procedure: DUODENAL STENT PLACEMENT;  Surgeon: Louis Meckel, MD;  Location: [facility] ENDOSCOPY;  Service: Endoscopy;  Laterality: N/A;    Ian Malkin RD, LDN Inpatient Clinical Dietitian Pager: [phone] After Hours Pager: [phone]

## 2013-08-25 DIAGNOSIS — B49 Unspecified mycosis: Secondary | ICD-10-CM | POA: Diagnosis present

## 2013-08-25 LAB — COMPREHENSIVE METABOLIC PANEL
AST: 56 U/L — ABNORMAL HIGH (ref 0–37)
CO2: 20 mEq/L (ref 19–32)
Calcium: 9.1 mg/dL (ref 8.4–10.5)
Creatinine, Ser: 1.3 mg/dL (ref 0.50–1.35)
GFR calc Af Amer: 69 mL/min — ABNORMAL LOW (ref >90)
GFR calc non Af Amer: 60 mL/min — ABNORMAL LOW (ref >90)

## 2013-08-25 LAB — BASIC METABOLIC PANEL
CO2: 20 mEq/L (ref 19–32)
Calcium: 9.3 mg/dL (ref 8.4–10.5)
GFR calc Af Amer: 72 mL/min — ABNORMAL LOW (ref >90)
GFR calc non Af Amer: 62 mL/min — ABNORMAL LOW (ref >90)
Sodium: 144 mEq/L (ref 135–145)

## 2013-08-25 LAB — CBC
MCH: 28.9 pg (ref 26.0–34.0)
MCV: 92.9 fL (ref 78.0–100.0)
Platelets: 166 10*3/uL (ref 150–400)
RBC: 2.53 MIL/uL — ABNORMAL LOW (ref 4.22–5.81)
RDW: 16.4 % — ABNORMAL HIGH (ref 11.5–15.5)

## 2013-08-25 LAB — PHOSPHORUS: Phosphorus: 5 mg/dL — ABNORMAL HIGH (ref 2.3–4.6)

## 2013-08-25 LAB — GLUCOSE, CAPILLARY: Glucose-Capillary: 107 mg/dL — ABNORMAL HIGH (ref 70–99)

## 2013-08-25 LAB — MAGNESIUM: Magnesium: 2.3 mg/dL (ref 1.5–2.5)

## 2013-08-25 MED ORDER — POTASSIUM CHLORIDE 20 MEQ/15ML (10%) PO LIQD
40.0000 meq | Freq: Once | ORAL | Status: AC
  Administered 2013-08-25: 40 meq
  Filled 2013-08-25: qty 30

## 2013-08-25 MED ORDER — DEXTROSE 50 % IV SOLN
INTRAVENOUS | Status: AC
  Filled 2013-08-25: qty 50

## 2013-08-25 MED ORDER — CHLORHEXIDINE GLUCONATE 0.12 % MT SOLN
OROMUCOSAL | Status: AC
  Filled 2013-08-25: qty 15

## 2013-08-25 MED ORDER — CHLORHEXIDINE GLUCONATE 0.12 % MT SOLN
15.0000 mL | Freq: Two times a day (BID) | OROMUCOSAL | Status: DC
  Administered 2013-08-25 – 2013-09-01 (×14): 15 mL via OROMUCOSAL
  Filled 2013-08-25 (×18): qty 15

## 2013-08-25 MED ORDER — DEXTROSE 50 % IV SOLN
25.0000 mL | Freq: Once | INTRAVENOUS | Status: AC | PRN
  Administered 2013-08-25: 25 mL via INTRAVENOUS

## 2013-08-25 MED ORDER — BIOTENE DRY MOUTH MT LIQD
15.0000 mL | Freq: Two times a day (BID) | OROMUCOSAL | Status: DC
  Administered 2013-08-25 – 2013-09-02 (×14): 15 mL via OROMUCOSAL

## 2013-08-25 MED ORDER — DEXTROSE 5 % IV SOLN
5.0000 mg/h | INTRAVENOUS | Status: DC
  Administered 2013-08-25: 5 mg/h via INTRAVENOUS
  Filled 2013-08-25 (×2): qty 25

## 2013-08-25 NOTE — Progress Notes (Signed)
[facility] PROGRESS NOTE  [name] WUJ:[id number] DOB: December 23, 1955 DOA: 08/23/2013 PCP: [name], MD  Brief narrative: 57 y.o. male with complicated medical problems including but not limited to metastatic adenocarcinoma, peritoneal carcinomatosis status post extensive debulking procedures and abdominal surgeries by Dr. [name] which has left him with multiple drains, fistulas, on chronic TPN, chronic PEG for gastric decompression, history of small bowel obstruction status post jejunal stent, ileostomy, cholecystitis status post cholecystostomy and chronic indwelling drain. Patient presented to [facility] ED 08/23/2013 with main concern of progressively worsening shortness of breath associated with subjective fevers, chills, malaise, started several days prior to this admission. Pt is currently under hospice care but is full code. Patient has pleurex cath placed for chronic symptoms management.  In ED, patient was found to be hypoxic with O2 saturation in 80's on RA that has quickly improved with oxygen via [place] to low 90's, Tmax 104 F in ED, and CXR findings were suggestive of possible multilobar PNA with possible vascular congestion. Patient is being treated for severe sepsis and is on broad-spectrum antibiotics. He is hemodynamically stable and not requiring pressors at this time. Hospital course further complicated due to findings of fungemia for which reason he is on Micafungin. PCCM assisting the management. Pt is following with the hospice at home but is full code.  Assessment and Plan:   Principal Problem:  *Acute respiratory failure with hypoxia  - Like multifactorial secondary to possible postobstructive pneumonia, malignant pleural effusion  - Chest x-ray done on the admission was significant for possible multilobar pneumonia and vascular congestion  - Initial oxygen saturation was 82% on room air but patient continues to saturate 100% on nasal cannula.  - PCCM was consulted  08/24/2013 due to to tachypnea and severe sepsis. Procalcitonin is 5.95. Pt is on broad spectrum antibiotics, vanco and cefepime. Flagyl added per PCCM for anaerobic coverage. - Continue using Albuterol and Atrovent every 6 hours as needed for shortness of breath or wheezing  - Pleurx catheter in place   Active Problems:  Severe sepsis  - Patient did need the criteria of sepsis with blood pressure 90/63, respiration of 44 and T 96.3 F. Initial procalcitonin 5.95 and lactic acid WNL - most likely secondary to postobstructive pneumonia, fungemia, PICC line infection - PICC line removed 08/24/2013 - Blood cultures on admission are growing yeast, speciation pending. Repeat blood cultures today. - Management as above with vancomycin, cefepime, flagyl and Micafungin  HCAP, postobstructive pneumonia - continue vancomycin and cefepime - initially blood cultures are growing yeast, follow up final blood culture results. Obtain blood cultures again today. - strep pneumonia and legionella - negative Abdominal malignant neoplasm, Metastatic adenocarcinoma, Peritoneal carcinomatosis  - currently under hospice care due to advancing adenocarcinoma  - pt looks chronically ill and tired, barely able to speak and per wife this is his baseline  - supportive care and ensuring comfort but pt is full ocde  Anemia of chronic disease  - Hemoglobin was 8.1 on the admission and subsequently 7.2  - transfuse if Hgb less than 7 Acute renal failure  - Cr in 04/2013 was ~ 1.7  - Admission creatinine 1.52 and now within normal limits  Severe protein-calorie malnutrition  - secondary to progressive malignant process and failure to thrive  - nutrition consult to resume TPN once medically indicated; TPN now on hold due to severe sepsis Oral thrush  - micafungin 100 mg daily will cover for oral thrush and fungemia  - continue magic mouthwash  Code Status: Full  Family Communication: no family at bedside  Disposition  Plan: home when stable   [name], MD  [facility]  Pager [phone]   Consultants:  PCCM Other consultants:   Wound care Procedures:  None  Antibiotics:  Vancomycin 08/23/2013 -->  Cefepime 08/23/2013 -->  Flagyl 08/24/2013 --> Micafungin 08/24/2013 --> Fluconazole 08/23/2013 --> 08/24/2013    If 7PM-7AM, please contact night-coverage [email or web address] Password [email or web address] 08/25/2013, 7:01 AM   LOS: 2 days    HPI/Subjective: No acute overnight events. Says he feels better this am.  Objective: Filed Vitals:   08/24/13 2200 08/25/13 0000 08/25/13 0500 08/25/13 0619  BP:  113/68 99/58   Pulse:  75 73   Temp: 97.3 F (36.3 C) 98.7 F (37.1 C) 96.5 F (35.8 C)   TempSrc: Oral Oral Axillary   Resp:  30 29   Height:      Weight:    105.1 kg (231 lb 11.3 oz)  SpO2:  100% 100%     Intake/Output Summary (Last 24 hours) at 08/25/13 0701 Last data filed at 08/25/13 0620  Gross per 24 hour  Intake   2065 ml  Output   2385 ml  Net   -320 ml    Exam:   General:  Pt is alert, follows commands appropriately, not in acute distress  Cardiovascular: Regular rate and rhythm, S1/S2 apprecaited  Respiratory: Coarse breath sounds bilaterally, no wheezing  Abdomen: Soft, non tender, non distended, bowel sounds present, no guarding; ileostomy, fistula and PEG tube in place; pleural drain in place  Extremities: No edema, pulses DP and PT palpable bilaterally  Neuro: Grossly nonfocal  Data Reviewed: Basic Metabolic Panel:  Recent Labs Lab 08/23/13 0930 08/23/13 1425 08/24/13 0625 08/25/13 0334  NA 139  --  141 143  K 4.0  --  4.4 3.9  CL 113*  --  114* 115*  CO2 19  --  19 20  GLUCOSE 99  --  85 68*  BUN 40*  --  37* 39*  CREATININE 1.52*  --  1.34 1.30  CALCIUM 8.8  --  9.1 9.1  MG  --  2.4  --   --   PHOS  --  2.6  --   --    Liver Function Tests:  Recent Labs Lab 08/23/13 0930 08/24/13 0625 08/25/13 0334  AST 37 34 56*  ALT 49 43 56*  ALKPHOS 208* 165* 168*   BILITOT 2.2* 2.2* 2.7*  PROT 7.1 6.7 6.8  ALBUMIN 2.0* 1.9* 1.9*    Recent Labs Lab 08/24/13 1140  LIPASE 38  AMYLASE 39   No results found for this basename: AMMONIA,  in the last 168 hours CBC:  Recent Labs Lab 08/23/13 0930 08/24/13 0625 08/25/13 0334  WBC 10.5 10.0 8.0  NEUTROABS 8.3*  --   --   HGB 8.1* 7.2* 7.3*  HCT 25.7* 22.5* 23.5*  MCV 93.1 92.6 92.9  PLT 180 162 166   Cardiac Enzymes: No results found for this basename: CKTOTAL, CKMB, CKMBINDEX, TROPONINI,  in the last 168 hours BNP: No components found with this basename: POCBNP,  CBG:  Recent Labs Lab 08/24/13 0756  GLUCAP 85    Recent Results (from the past 240 hour(s))  CULTURE, BLOOD (ROUTINE X 2)     Status: None   Collection Time    08/23/13  9:30 AM      Result Value Range Status   Specimen Description BLOOD PICC LEFT ARM  5  ML IN [facility] BOTTLE   Final   Special Requests NONE   Final   Culture  Setup Time     Final   Value: 08/23/2013 16:16     Performed at [facility]   Culture     Final   Value: YEAST     Note: Gram Stain Report Called to,Read Back By and Verified With: TIA JOHNSON 08/24/13 0940 BY SMITHERSJ     Performed at [facility]   Report Status PENDING   Incomplete  MRSA PCR SCREENING     Status: Abnormal   Collection Time    08/23/13 11:52 AM      Result Value Range Status   MRSA by PCR POSITIVE (*) NEGATIVE Final   Comment:            The GeneXpert MRSA Assay (FDA     approved for NASAL specimens     only), is one component of a     comprehensive MRSA colonization     surveillance program. It is not     intended to diagnose MRSA     infection nor to guide or     monitor treatment for     MRSA infections.     RESULT CALLED TO, READ BACK BY AND VERIFIED WITH:     ASHLEY,A AT 1358 ON [id number] BY POTEAT,S  URINE CULTURE     Status: None   Collection Time    08/23/13  3:27 PM      Result Value Range Status   Specimen Description URINE, RANDOM   Final    Special Requests NONE   Final   Culture  Setup Time     Final   Value: 08/23/2013 22:07     Performed at [facility] Count     Final   Value: NO GROWTH     Performed at [facility]   Culture     Final   Value: NO GROWTH     Performed at [facility]   Report Status 08/24/2013 FINAL   Final     Studies: Ct Chest Wo Contrast  08/24/2013   *RADIOLOGY REPORT*  Clinical Data: Evaluate for infiltrates.  CT CHEST WITHOUT CONTRAST  Technique:  Multidetector CT imaging of the chest was performed following the standard protocol without IV contrast.  Comparison: Chest CT 11/09/2011.  Findings:  Mediastinum: Heart size is mildly enlarged. Trace amount of pericardial fluid and/or thickening, unlikely to be of any hemodynamic significance at this time.  No associated pericardial calcification.  Mild atherosclerosis of the thoracic aorta. Dilatation of the pulmonic trunk (4.3 cm in diameter), suggestive of elevated pulmonary artery pressures. No pathologically enlarged mediastinal or hilar lymph nodes. Please note that accurate exclusion of hilar adenopathy is limited on noncontrast CT scans. Esophagus is unremarkable in appearance.  Lungs/Pleura: Patchy areas of ground-glass attenuation with extensive interlobular septal thickening and profound thickening of the peribronchovascular interstitium, favored to represent pulmonary edema.  Small to moderate bilateral pleural effusions, both of which are partially loculated with fluid in the major fissures bilaterally.  A small-bore right-sided chest tube extends into the posterior aspect of the right pleural space.  Upper Abdomen: An open anterior abdominal wound is incompletely visualized.  Trace volume of ascites.  Tubing is present within the stomach, but incompletely visualized.  This is not a nasogastric tube.  Musculoskeletal: There are no aggressive appearing lytic or blastic lesions noted in the visualized portions of  the  skeleton.  IMPRESSION: 1.  Multifocal interstitial and airspace disease throughout the lungs bilaterally, favored to predominately reflect underlying pulmonary edema.  Given the cardiomegaly and bilateral pleural effusions, findings are favored to reflect congestive heart failure.  Superimposed infection with multilobar pneumonia is difficult to entirely exclude, particularly in light of the partially loculated pleural effusions bilaterally.  Clinical correlation is recommended. 2.  Support apparatus and postoperative changes, as above.   Original Report Authenticated By: Trudie Reed, M.D.   Dg Chest Port 1 View  08/23/2013   *RADIOLOGY REPORT*  Clinical Data: Shortness of breath.  Fever.  PORTABLE CHEST - 1 VIEW  Comparison: Chest x-ray 04/03/2013.  Findings: Lung volumes are low.  Bibasilar opacities may reflect areas of atelectasis and/or consolidation.  Probable small bilateral pleural effusions.  Right-sided chest tube in place with tip and sideport projecting over the right hemithorax. There is a left upper extremity PICC with tip terminating in the proximal superior vena cava. Marked enlargement of the cardiopericardial silhouette. The patient is rotated to the right on today's exam, resulting in distortion of the mediastinal contours and reduced diagnostic sensitivity and specificity for mediastinal pathology. Cephalization of the pulmonary vasculature with indistinctness of the interstitial markings which may suggest mild interstitial pulmonary edema.  IMPRESSION: 1.  Support apparatus, as above. 2.  The appearance of the chest suggests mild congestive heart failure, however, multilobar pneumonia is difficult to exclude. Clinical correlation is recommended. 3.  Small bilateral pleural effusions.   Original Report Authenticated By: Trudie Reed, M.D.    Scheduled Meds: . ceFEPime (MAXIPIME) IV  1 g Intravenous Q8H  . Chlorhexidine Gluconate Cloth  6 each Topical Q0600  . fentaNYL  100 mcg  Transdermal Q72H  . metronidazole  500 mg Intravenous Q6H  . micafungin (MYCAMINE) IV  100 mg Intravenous Daily  . mupirocin ointment  1 application Nasal BID  . pantoprazole  40 mg Intravenous q morning - 10a  . promethazine      . sodium chloride  3 mL Intravenous Q12H  . vancomycin  1,250 mg Intravenous Q12H   Continuous Infusions: . sodium chloride 75 mL/hr at 08/24/13 1200  . HYDROmorphone 1 mg/hr (08/24/13 2100)

## 2013-08-25 NOTE — Progress Notes (Signed)
PULMONARY  / CRITICAL CARE MEDICINE  Name: [name] MRN: [id number] DOB: 1956-08-15    ADMISSION DATE:  08/23/2013 CONSULTATION DATE:  9/8  REFERRING MD :  Elisabeth Pigeon  PRIMARY SERVICE: [facility]   CHIEF COMPLAINT:  Severe sepsis and respiratory distress   BRIEF PATIENT DESCRIPTION:  57 y.o. male w/ h/o  metastatic adenocarcinoma, peritoneal carcinomatosis status post exhaustive chemo (not well enough to receive for > 9 mo now)  extensive debulking procedures and abdominal surgeries  which has left him with multiple drains and fistulas, on chronic TPN via a PICC line, chronic PEG for gastric decompression, history of small bowel obstruction status post jejunal stent, ileostomy, cholecystitis status post cholecystostomy and chronic indwelling drain as well as chronic pleurex cath for chronic effusion (? Malignant). Admitted on 9/7 w/ fever, dyspnea and CXR c/w PNA. PCCM asked to see on 9/8 for sepsis and resp failure.   SIGNIFICANT EVENTS / STUDIES:   LINES / TUBES: PICC (chronic) remove 9/8  CULTURES: MRSA screen 9/7: positive  BCX2 9/7: yeast >>>  ANTIBIOTICS: Cefepime 9/8>>> Flagyl 9/8>>> vanc 9/8>>> mycafungin 9/8>>>  ROS   SUBJECTIVE:  No acute distress   VITAL SIGNS: Temp:  [96.3 F (35.7 C)-98.7 F (37.1 C)] 97.3 F (36.3 C) (09/09 0800) Pulse Rate:  [68-79] 74 (09/09 1000) Resp:  [20-30] 23 (09/09 1000) BP: (90-113)/(55-71) 91/55 mmHg (09/09 0800) SpO2:  [100 %] 100 % (09/09 1000) Weight:  [105.1 kg (231 lb 11.3 oz)] 105.1 kg (231 lb 11.3 oz) (09/09 0619) HEMODYNAMICS:   VENTILATOR SETTINGS:   INTAKE / OUTPUT: Intake/Output     09/08 0701 - 09/09 0700 09/09 0701 - 09/10 0700   I.V. (mL/kg) 1400 (13.3) 112.5 (1.1)   Other     IV Piggyback 1150 150   Total Intake(mL/kg) 2550 (24.3) 262.5 (2.5)   Urine (mL/kg/hr) 875 (0.3) 150 (0.3)   Drains 1450 (0.6)    Stool 50 (0)    Chest Tube 10 (0)    Total Output 2385 150   Net +165 +112.5        Urine Occurrence  1 x      PHYSICAL EXAMINATION: General:  Chronically ill appearing AAM not in acute distress. Appears better today  Neuro:  Awake, generalized weakness. Poor memory  HEENT:  Neck veins flat  Cardiovascular:  rrr Lungs:  Decreased on right pleurex drain w/ exudative appearing output  Abdomen:  Mid abd ostomy bags draining.  Musculoskeletal:  Generalized weakness  Skin:  Edema   LABS:  CBC Recent Labs     08/23/13  0930  08/24/13  0625  08/25/13  0334  WBC  10.5  10.0  8.0  HGB  8.1*  7.2*  7.3*  HCT  25.7*  22.5*  23.5*  PLT  180  162  166   Coag's Recent Labs     08/23/13  1425  APTT  37  INR  1.71*   BMET Recent Labs     08/23/13  0930  08/24/13  0625  08/25/13  0334  NA  139  141  143  K  4.0  4.4  3.9  CL  113*  114*  115*  CO2  19  19  20   BUN  40*  37*  39*  CREATININE  1.52*  1.34  1.30  GLUCOSE  99  85  68*   Electrolytes Recent Labs     08/23/13  0930  08/23/13  1425  08/24/13  4098  08/25/13  0334  CALCIUM  8.8   --   9.1  9.1  MG   --   2.4   --    --   PHOS   --   2.6   --    --    Sepsis Markers Recent Labs     08/24/13  1148  PROCALCITON  5.95   ABG No results found for this basename: PHART, PCO2ART, PO2ART,  in the last 72 hours Liver Enzymes Recent Labs     08/23/13  0930  08/24/13  0625  08/25/13  0334  AST  37  34  56*  ALT  49  43  56*  ALKPHOS  208*  165*  168*  BILITOT  2.2*  2.2*  2.7*  ALBUMIN  2.0*  1.9*  1.9*   Cardiac Enzymes Recent Labs     08/23/13  0930  08/24/13  1148  PROBNP  9174.0*  5661.0*   Glucose Recent Labs     08/24/13  0756  08/25/13  0802  08/25/13  0826  GLUCAP  85  58*  107*    Imaging Ct Chest Wo Contrast  08/24/2013   *RADIOLOGY REPORT*  Clinical Data: Evaluate for infiltrates.  CT CHEST WITHOUT CONTRAST  Technique:  Multidetector CT imaging of the chest was performed following the standard protocol without IV contrast.  Comparison: Chest CT 11/09/2011.  Findings:  Mediastinum:  Heart size is mildly enlarged. Trace amount of pericardial fluid and/or thickening, unlikely to be of any hemodynamic significance at this time.  No associated pericardial calcification.  Mild atherosclerosis of the thoracic aorta. Dilatation of the pulmonic trunk (4.3 cm in diameter), suggestive of elevated pulmonary artery pressures. No pathologically enlarged mediastinal or hilar lymph nodes. Please note that accurate exclusion of hilar adenopathy is limited on noncontrast CT scans. Esophagus is unremarkable in appearance.  Lungs/Pleura: Patchy areas of ground-glass attenuation with extensive interlobular septal thickening and profound thickening of the peribronchovascular interstitium, favored to represent pulmonary edema.  Small to moderate bilateral pleural effusions, both of which are partially loculated with fluid in the major fissures bilaterally.  A small-bore right-sided chest tube extends into the posterior aspect of the right pleural space.  Upper Abdomen: An open anterior abdominal wound is incompletely visualized.  Trace volume of ascites.  Tubing is present within the stomach, but incompletely visualized.  This is not a nasogastric tube.  Musculoskeletal: There are no aggressive appearing lytic or blastic lesions noted in the visualized portions of the skeleton.  IMPRESSION: 1.  Multifocal interstitial and airspace disease throughout the lungs bilaterally, favored to predominately reflect underlying pulmonary edema.  Given the cardiomegaly and bilateral pleural effusions, findings are favored to reflect congestive heart failure.  Superimposed infection with multilobar pneumonia is difficult to entirely exclude, particularly in light of the partially loculated pleural effusions bilaterally.  Clinical correlation is recommended. 2.  Support apparatus and postoperative changes, as above.   Original Report Authenticated By: Trudie Reed, M.D.     CXR: vasc congestion vs multi-lobar PNA    ASSESSMENT / PLAN:  PULMONARY A:  PNA (high risk aspiration, res organism) Respiratory distress  Chronic pleural effusions States that his pleural effusion cytology was negative but we do not have confirmatory labs. CT suggests volume excess/ pulmonary edema and PNA Cannot exclude mets secondary to volume, and motion P:   Supplemental oxygen HCAP coverage Diuresis as BP and creatinine will allow  ABX, pct is eleavted which should not rise from  fungus  CARDIOVASCULAR A:  Severe sepsis (see ID section) Severe CM-->new EF 30-35% (was 50-55%), this could be a septic CM from fungemia  P: Lasix gtt to avoid hypotension Negative balance 1 liter goal Treat sepsis, fungemia  RENAL A:   Hyperchloremia  Early NAG metabolic acidosis Gross overload, new cardiomyoapthy P:   KVO IVF  F/u chemistry  Empiric KCL  GASTROINTESTINAL A:   Metastatic adenocarcinoma, peritoneal carcinomatosis status post extensive debulking procedures and abdominal surgeries  multiple drains and fistulas, on chronic TPN via a PICC line, chronic PEG for gastric decompression, history of small bowel obstruction status post jejunal stent, ileostomy, cholecystitis status post cholecystostomy and chronic indwelling drain. P:   Cont NPO status TPn will need to be held  Give longer holiday without TPN  HEMATOLOGIC A:   Metastatic adenocarcinoma Has not had chemo in > 9 mo. Not a candidate for further chemo. Concerned that his cancer has progressed.  Anemia of chronic disease  P:  Transfuse for hgb <7, doubt will change outcome  Needs palliative care   INFECTIOUS A:   Fungemia-->chronic PICC the likely source, PICC out 9/8 Pneumonia -->concerned about post-obstructive process. P:   Cont micafungin  Broaden for anaerobic coverage, added flagyl for anaerobic coverage for asp concern and post obstructive   Will keep PICC out as long as we can   ENDOCRINE A:  No acute issue  P:   Trend glucose    NEUROLOGIC A:  No acute issue  P:   Supportive care   TODAY'S SUMMARY:   Have encouraged him to talk open and up-front with his family re: advanced directives.  Aggressive medical therapy, vent, acls , hd, pressors would be MEDICALLY INEFFECTIVE therapy.  Will try lasix gtt.   I have personally obtained a history, examined the patient, evaluated laboratory and imaging results, formulated the assessment and plan and placed orders.  08/25/2013, 11:08 AM  Mcarthur Rossetti. Tyson Alias, MD, FACP Pgr: [phone] [facility]

## 2013-08-25 NOTE — Progress Notes (Addendum)
Room WL 1227 - Cecile Hearing - [facility] RN Visit-R.Doshie Maggi RN  NON COVERED ADMISSION due to private insurance.  Admission is related to [facility] diagnosis of malignancy.  Pt is FULL code.    Pt alert & oriented, lying upright in bed, without complaints of pain, does admit to feeling of soreness "all over."  No family present. Patient's home medication list is on shadow chart.   Discussed with Attending Dr. Elisabeth Pigeon who states pt is some improved and stable today - she wants to keep in stepdown for the time being.  PCCM was consulted for coordination of care.  Discussed patient with CM RN Bjorn Loser.  Please call [facility] @ [phone]- ask for RN Liaison or after hours,ask for on-call RN with any hospice needs.   Thank you.  [name], RN  [facility]  Hospice Liaison  [phone])

## 2013-08-25 NOTE — Consult Note (Signed)
WOC ostomy follow up Stoma type/location: Ileostomy upper quadrant; midline abdominal fistula  Stomal assessment/size: Ileostomy flush and pink/ Fistula opening is flush and located within a deep crevice from scarring.   Peristomal assessment:  Intact for both sites.  Treatment options for stomal/peristomal skin: Barrier rings to pouch for both ileostomy and fistula.  Fistula is connected to low wall suction.  Output Minimal output from ileostomy, fistula has approximately 100 ml in canister.  Ostomy pouching: 1pc with barrier ring to Ileostomy.  Eakin pouch with barrier ring to Fistula, careful to fill in crevices and folds along abdominal scarring.  Supplies in room for next pouch change when needed.  Education provided: Patient sleeping.  Arousable to voice.  Informed patient that we would be changing pouches.No family at bedside at this time.  WOC nursing to continue to follow at this time.  Maple Hudson RN BSN CWON Pager [phone]

## 2013-08-26 DIAGNOSIS — E870 Hyperosmolality and hypernatremia: Secondary | ICD-10-CM | POA: Diagnosis not present

## 2013-08-26 DIAGNOSIS — R7881 Bacteremia: Secondary | ICD-10-CM

## 2013-08-26 DIAGNOSIS — E876 Hypokalemia: Secondary | ICD-10-CM | POA: Diagnosis not present

## 2013-08-26 DIAGNOSIS — J90 Pleural effusion, not elsewhere classified: Secondary | ICD-10-CM | POA: Diagnosis present

## 2013-08-26 LAB — IRON AND TIBC
Saturation Ratios: 35 % (ref 20–55)
TIBC: 123 ug/dL — ABNORMAL LOW (ref 215–435)
UIBC: 80 ug/dL — ABNORMAL LOW (ref 125–400)

## 2013-08-26 LAB — PHOSPHORUS: Phosphorus: 5.2 mg/dL — ABNORMAL HIGH (ref 2.3–4.6)

## 2013-08-26 LAB — GLUCOSE, CAPILLARY
Glucose-Capillary: 68 mg/dL — ABNORMAL LOW (ref 70–99)
Glucose-Capillary: 72 mg/dL (ref 70–99)

## 2013-08-26 LAB — FERRITIN: Ferritin: 2851 ng/mL — ABNORMAL HIGH (ref 22–322)

## 2013-08-26 LAB — BASIC METABOLIC PANEL
CO2: 24 mEq/L (ref 19–32)
CO2: 28 mEq/L (ref 19–32)
Calcium: 9.5 mg/dL (ref 8.4–10.5)
Chloride: 112 mEq/L (ref 96–112)
Chloride: 107 mEq/L (ref 96–112)
Creatinine, Ser: 1.27 mg/dL (ref 0.50–1.35)
Creatinine, Ser: 1.33 mg/dL (ref 0.50–1.35)
GFR calc Af Amer: 68 mL/min — ABNORMAL LOW (ref >90)
GFR calc Af Amer: 72 mL/min — ABNORMAL LOW (ref >90)
GFR calc non Af Amer: 62 mL/min — ABNORMAL LOW (ref >90)
Glucose, Bld: 79 mg/dL (ref 70–99)
Potassium: 3.1 mEq/L — ABNORMAL LOW (ref 3.5–5.1)
Potassium: 3.4 mEq/L — ABNORMAL LOW (ref 3.5–5.1)
Sodium: 146 mEq/L — ABNORMAL HIGH (ref 135–145)
Sodium: 146 mEq/L — ABNORMAL HIGH (ref 135–145)

## 2013-08-26 LAB — TROPONIN I: Troponin I: <0.3 ng/mL (ref <0.30)

## 2013-08-26 LAB — MAGNESIUM
Magnesium: 1.6 mg/dL (ref 1.5–2.5)
Magnesium: 2 mg/dL (ref 1.5–2.5)

## 2013-08-26 LAB — VITAMIN B12: Vitamin B-12: 612 pg/mL (ref 211–911)

## 2013-08-26 MED ORDER — POTASSIUM CHLORIDE 20 MEQ/15ML (10%) PO LIQD
40.0000 meq | Freq: Once | ORAL | Status: AC
Start: 2013-08-26 — End: 2013-08-26
  Administered 2013-08-26: 40 meq
  Filled 2013-08-26: qty 30

## 2013-08-26 MED ORDER — METOPROLOL TARTRATE 1 MG/ML IV SOLN
2.5000 mg | Freq: Three times a day (TID) | INTRAVENOUS | Status: DC
  Administered 2013-08-26 (×2): 2.5 mg via INTRAVENOUS
  Filled 2013-08-26 (×8): qty 5

## 2013-08-26 MED ORDER — POTASSIUM CHLORIDE 20 MEQ/15ML (10%) PO LIQD
40.0000 meq | Freq: Once | ORAL | Status: AC
  Administered 2013-08-26: 40 meq
  Filled 2013-08-26: qty 30

## 2013-08-26 MED ORDER — POTASSIUM CHLORIDE 20 MEQ/15ML (10%) PO LIQD
40.0000 meq | ORAL | Status: AC
  Administered 2013-08-26 (×2): 40 meq
  Filled 2013-08-26 (×2): qty 30

## 2013-08-26 MED ORDER — DEXTROSE 10 % IV SOLN
INTRAVENOUS | Status: AC
  Administered 2013-08-26 – 2013-08-28 (×4): via INTRAVENOUS
  Administered 2013-08-29: 500 mL via INTRAVENOUS

## 2013-08-26 MED ORDER — POTASSIUM CHLORIDE 20 MEQ/15ML (10%) PO LIQD
40.0000 meq | Freq: Once | ORAL | Status: AC
  Administered 2013-08-27: 40 meq
  Filled 2013-08-26: qty 30

## 2013-08-26 MED ORDER — DEXTROSE 5 % IV SOLN
INTRAVENOUS | Status: DC
  Administered 2013-08-26: 08:00:00 via INTRAVENOUS

## 2013-08-26 NOTE — Progress Notes (Signed)
ANTIBIOTIC CONSULT NOTE - FOLLOW UP  Pharmacy Consult for Renal adjustment of antibiotics (Cefepime) Indication: pneumonia  Allergies  Allergen Reactions  . Percocet [Oxycodone-Acetaminophen] Nausea And Vomiting    hallucination    Patient Measurements: Height: 6\' 4"  (193 cm) Weight: 210 lb 5.1 oz (95.4 kg) IBW/kg (Calculated) : 86.8  Vital Signs: Temp: 97.5 F (36.4 C) (09/10 0800) Temp src: Oral (09/10 0800) BP: 109/65 mmHg (09/10 0800) Pulse Rate: 78 (09/10 0800) Intake/Output from previous day: 09/09 0701 - 09/10 0700 In: 2393.5 [I.V.:1143.5; IV Piggyback:1150] Out: 5325 [Urine:3225; Drains:2070; Stool:30] Intake/Output from this shift: Total I/O In: 270.3 [I.V.:220.3; IV Piggyback:50] Out: 1625 [Urine:1625]  Labs:  Recent Labs  08/24/13 0625 08/25/13 0334 08/25/13 1235 08/26/13 0015  WBC 10.0 8.0  --   --   HGB 7.2* 7.3*  --   --   PLT 162 166  --   --   CREATININE 1.34 1.30 1.26 1.26   Estimated Creatinine Clearance: 80.4 ml/min (by C-G formula based on Cr of 1.26).  Microbiology: 9/7 blood (PICC): Candida albicans 9/7 urine: NGF 9/7 mrsa pcr (+)  9/9 blood x2: ngtd 9/10 blood x2: sent  Antiinfectives: 9/7 zosyn x 1 9/7 >> Vanc >> 9/10 9/7 >> Cefepime x8d >>  9/8 >> Flagyl >> 9/10 9/8 >> Micafungin >>  Assessment: 82 yoM with hx of metastatic peritoneal carcinomatosis s/p extensive debulking procedures presenting with SOB, confusion, and fever.  Day #4/8 Cefepime (Vanc/Flagyl dc'd) for postobstructive PNA  Day #3 Micafungin per MD for fungemia  SCr essentially stable, CrCl 80 ml/min  Currently afebrile (down to 96.3 yesterday)  Repeat blood cultures pending  Goal of Therapy:  Eradication of infection Dose per renal function  Plan:   Continue Cefepime 1g IV q8h x 4 more days Follow up renal function & cultures Follow up ID recommendations  Loralee Pacas, PharmD, BCPS Pager: [phone] 08/26/2013,11:09 AM

## 2013-08-26 NOTE — Progress Notes (Signed)
eLink Physician-Brief Progress Note Patient Name: [name] DOB: 1956/02/29 MRN: [id number]  Date of Service  08/26/2013   HPI/Events of Note  Hypokalemia   eICU Interventions  Potassium replaced   Intervention Category Minor Interventions: Electrolytes abnormality - evaluation and management  DETERDING,ELIZABETH 08/26/2013, 2:30 AM

## 2013-08-26 NOTE — Consult Note (Signed)
[facility]    Date of Admission:  08/23/2013    Total days of antibiotics 4        Day 4 cefepime        Day 3 micafungin               Reason for Consult: Candida albicans fungemia and possible healthcare associated pneumonia    Referring Physician: Dr. Ramiro Harvest  Principal Problem:   Fungemia Active Problems:   Sepsis   HCAP (healthcare-associated pneumonia)   Recurrent bacteremia   Abdominal malignant neoplasm   Metastatic adenocarcinoma   Peritoneal carcinomatosis   Anemia of chronic disease   Colo-enteric fistula   Severe protein-calorie malnutrition   Acute respiratory failure with hypoxia   Acute renal failure   Hypokalemia   Hypernatremia   Pleural effusion   . antiseptic oral rinse  15 mL Mouth Rinse q12n4p  . ceFEPime (MAXIPIME) IV  1 g Intravenous Q8H  . chlorhexidine  15 mL Mouth Rinse BID  . Chlorhexidine Gluconate Cloth  6 each Topical Q0600  . fentaNYL  100 mcg Transdermal Q72H  . metoprolol  2.5 mg Intravenous Q8H  . micafungin (MYCAMINE) IV  100 mg Intravenous Daily  . mupirocin ointment  1 application Nasal BID  . pantoprazole  40 mg Intravenous q morning - 10a  . potassium chloride  40 mEq Per Tube Q4H  . sodium chloride  3 mL Intravenous Q12H    Recommendations: 1. Continue micafungin and cefepime 2. Await results of repeat blood cultures before placing PICC 3. I have requested antifungal susceptibility testing on his Candida isolate 4. Repeat chest x-ray  Assessment: He has had recurrent bloodstream infections complicating his chronic TPN therapy. I do not see any evidence to suggest new intra-abdominal infection and would not pursue CT scanning of the abdomen or pelvis at this time. He may have had healthcare associated pneumonia but I also suspect that he has heart failure and recent volume overload. He has had symptomatic improvement with diuresis. I will continue cefepime for 3 more days then  stop. Candida albicans is normally sensitive to azolel antifungals such as fluconazole but he has had recurrent fungemia and has probably had multiple exposures to fluconazole so I will continue micafungin pending susceptibility testing. He needs a minimum of 2 weeks of antifungal therapy starting on the date of his first negative blood culture. I would to hold off on PICC placement until we know the repeat blood cultures are negative. Ideally he should have a thorough ophthalmologic exam to rule out the possibility of fungal endophthalmitis but this may be difficult to arrange.    HPI: [name] is a 57 y.o. male who underwent abdominal surgery for adenocarcinoma in January of 2013. He required repeat surgery one year ago for peritoneal recurrences. 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 His also had small bowel obstruction. He has been n.p.o. on TPN through PICC lines for the past year. He's had multiple hospitalizations for recurrent bloodstream infections including:   MRSA January 2014  Candida albicans February 2014  Vancomycin resistant enterococcus April 2014  Coagulase-negative staph August 2014  Candida albicans 08/23/2013  He has been receiving care recently at [facility] but has also had admissions to [facility], [place] [facility] and here. He recently started to note sudden weight gain, progressive shortness of breath and subjective fevers leading  to his admission here 3 days ago. He had a temperature up to 104.2. Chest x-ray and CT scan of the chest showed bilateral effusions which apparently are chronic and infiltrates that were nonspecific. It is not clear if he has heart failure, pneumonia or both. His discharge summary from [facility] one month ago also suggested that he might have lung nodules seen previously. He had a right Pleurex chest tube placed at [facility] a little over one month ago for a recurrent  right-sided effusion.  Admission blood cultures this time have grown Candida albicans. His PICC line has been removed. Repeat blood cultures are negative at 24 hours. He is feeling much better. He has had a 10 kg weight loss since admission and is much less short of breath. He has not had any recent cough or sputum production. He has not had any new abdominal pain. He has not had any visual disturbances.    Review of Systems: Constitutional: positive for fatigue and fevers, negative for chills, sweats and weight loss Eyes: negative for visual disturbance Ears, nose, mouth, throat, and face: negative Respiratory: positive for dyspnea on exertion, negative for cough, pleurisy/chest pain and sputum Cardiovascular: negative Gastrointestinal: positive for chronic high output from his ileostomy, negative for abdominal pain, diarrhea, nausea and vomiting Genitourinary:negative  Past Medical History  Diagnosis Date  . Hypertension   . GERD (gastroesophageal reflux disease)   . Small bowel obstruction 2013  . 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 Acute cholecystitis s/p perc draianage [id number] 12/20/2012  . Anemia of chronic disease 12/19/2012  . 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 MRSA bacteremia 01/04/2013  . SBO (small bowel obstruction) 11/07/2011  . Septic shock due to Staphylococcus aureus 12/24/2012  . Severe protein-calorie malnutrition 01/17/2013  . Hospice care patient     History  Substance Use Topics  . Smoking status: Never Smoker   . Smokeless tobacco: Never Used  . Alcohol Use: No     Comment: Stopped in July    Family History  Problem Relation Age of Onset  . Lung cancer Brother   . Hypertension Mother   . Hypertension Father   . Heart failure Father    Allergies  Allergen Reactions  . Percocet [Oxycodone-Acetaminophen] Nausea And Vomiting    hallucination     OBJECTIVE: Blood pressure 93/53, pulse 72, temperature 97.2 F (36.2 C), temperature source Oral, resp. rate 24, height 6\' 4"  (1.93 m), weight 95.4 kg (210 lb 5.1 oz), SpO2 100.00%. General:  he is alert and comfortable Skin: No rash  Eyes: Normal external exam  Lungs:  diminished breath sounds bilaterally with a few crackles. Right side Pleurex chest tube Cor:  distant heart sounds, no murmur heard Abdomen:  healed midline incision. Left upper quadrant PEG tube. Right-sided ileostomy. Nontender with no palpable masses Joints and extremities: No acute abnormalities and no edema  Mood and affect: Normal   Lab Results  Component Value Date   WBC 8.0 08/25/2013   HGB 7.3* 08/25/2013   HCT 23.5* 08/25/2013   MCV 92.9 08/25/2013   PLT 166 08/25/2013   BMET    Component Value Date/Time   NA 146* 08/26/2013 1130   K 3.3* 08/26/2013 1130   CL 112 08/26/2013 1130   CO2 24 08/26/2013 1130   GLUCOSE 79 08/26/2013 1130   BUN 32* 08/26/2013 1130   CREATININE 1.27 08/26/2013 1130  CALCIUM 9.5 08/26/2013 1130   GFRNONAA 62* 08/26/2013 1130   GFRAA 71* 08/26/2013 1130   Lab Results  Component Value Date   ALT 56* 08/25/2013   AST 56* 08/25/2013   ALKPHOS 168* 08/25/2013   BILITOT 2.7* 08/25/2013   Microbiology: Recent Results (from the past 240 hour(s))  CULTURE, BLOOD (ROUTINE X 2)     Status: None   Collection Time    08/23/13  9:30 AM      Result Value Range Status   Specimen Description BLOOD PICC LEFT ARM  5 ML IN [facility] BOTTLE   Final   Special Requests NONE   Final   Culture  Setup Time     Final   Value: 08/23/2013 16:16     Performed at [facility]   Culture     Final   Value: CANDIDA ALBICANS     Note: Gram Stain Report Called to,Read Back By and Verified With: TIA JOHNSON 08/24/13 0940 BY SMITHERSJ     Performed at [facility]   Report Status 08/25/2013 FINAL   Final  MRSA PCR SCREENING     Status: Abnormal   Collection Time    08/23/13 11:52 AM      Result Value Range  Status   MRSA by PCR POSITIVE (*) NEGATIVE Final   Comment:            The GeneXpert MRSA Assay (FDA     approved for NASAL specimens     only), is one component of a     comprehensive MRSA colonization     surveillance program. It is not     intended to diagnose MRSA     infection nor to guide or     monitor treatment for     MRSA infections.     RESULT CALLED TO, READ BACK BY AND VERIFIED WITH:     ASHLEY,A AT 1358 ON [id number] BY POTEAT,S  URINE CULTURE     Status: None   Collection Time    08/23/13  3:27 PM      Result Value Range Status   Specimen Description URINE, RANDOM   Final   Special Requests NONE   Final   Culture  Setup Time     Final   Value: 08/23/2013 22:07     Performed at [facility] Count     Final   Value: NO GROWTH     Performed at [facility]   Culture     Final   Value: NO GROWTH     Performed at [facility]   Report Status 08/24/2013 FINAL   Final  CULTURE, BLOOD (ROUTINE X 2)     Status: None   Collection Time    08/25/13 12:35 PM      Result Value Range Status   Specimen Description BLOOD RIGHT HAND   Final   Special Requests BOTTLES DRAWN AEROBIC ONLY 4CC   Final   Culture  Setup Time     Final   Value: 08/25/2013 15:06     Performed at [facility]   Culture     Final   Value:        BLOOD CULTURE RECEIVED NO GROWTH TO DATE CULTURE WILL BE HELD FOR 5 DAYS BEFORE ISSUING A FINAL NEGATIVE REPORT     Performed at [facility]   Report Status PENDING   Incomplete  CULTURE, BLOOD (ROUTINE X 2)  Status: None   Collection Time    08/25/13 12:35 PM      Result Value Range Status   Specimen Description BLOOD LEFT ARM   Final   Special Requests     Final   Value: BOTTLES DRAWN AEROBIC AND ANAEROBIC 10CC AER 5CC ANA   Culture  Setup Time     Final   Value: 08/25/2013 15:06     Performed at [facility]   Culture     Final   Value:        BLOOD CULTURE RECEIVED NO GROWTH TO DATE CULTURE  WILL BE HELD FOR 5 DAYS BEFORE ISSUING A FINAL NEGATIVE REPORT     Performed at [facility]   Report Status PENDING   Incomplete    Cliffton Asters, MD [facility] [phone] pager   [phone] cell 08/26/2013, 3:34 PM

## 2013-08-26 NOTE — Progress Notes (Signed)
Hypoglycemic Event  CBG: 68@0752   Treatment: IVFs ordered to change to D5.  IVFs changed as ordered  Symptoms: None  Follow-up CBG: FAOZ:[id number] CBG Result:72  Possible Reasons for Event: Inadequate meal intake and Other: GI Losses through drains/ostomies  Comments/MD notified:Dr. Janee Morn, Dr. Lavone Nian, Stephannie Peters  Remember to initiate Hypoglycemia Order Set & complete

## 2013-08-26 NOTE — Progress Notes (Signed)
eLink Physician-Brief Progress Note Patient Name: [name] DOB: May 30, 1956 MRN: [id number]  Date of Service  08/26/2013   HPI/Events of Note  Hypokalemia  eICU Interventions  Potassium replaced   Intervention Category Minor Interventions: Electrolytes abnormality - evaluation and management  Sheronica Corey 08/26/2013, 11:18 PM

## 2013-08-26 NOTE — Progress Notes (Signed)
CRITICAL VALUE ALERT  Critical value received:  vanc level  Date of notification:  08/26/13  Time of notification:  1236  Critical value read back:yes  Nurse who received alert:  Sherlene Shams, RN  MD notified (1st page):  Janee Morn  Time of first page:  1240  MD notified (2nd page):  Time of second page:  Responding MD:  Janee Morn  Time MD responded:  1245; asked to notify PharmD.  PharmD notified, no new orders given (vanc had been d/c-ed)

## 2013-08-26 NOTE — Progress Notes (Addendum)
Room WL 1227 - Cecile Hearing - [facility] RN Visit-R.Nazariah Cadet RN  NON-COVERED ADMISSION. Admission diagnosis related to [facility] diagnosis of abdominal malignancy.   Pt is FULL code during this hospitalization.    Pt alert & oriented, lying in bed, without complaints of pain or discomfort.    No family present. Patient's home medication list is on shadow chart.   Discussed with pt if he, his wife and son would like to meet with PMT MD - have met previously.  Explained it would be to clarify realistic goals, ensure pt and family know the progression of his disease and for them to voice the goals they want for pt.  Pt will discuss with his wife today and [facility] RN will address with pt and PMT if they agree.  Discussed this plan with CM RN Bjorn Loser on ICU and [facility] RN Mgr.  Please call [facility] @ [phone]- ask for RN Liaison or after hours,ask for on-call RN with any hospice needs.   Thank you.  [name], RN  [facility]  Hospice Liaison  [phone])

## 2013-08-26 NOTE — Progress Notes (Signed)
PULMONARY  / CRITICAL CARE MEDICINE  Name: [name] MRN: [id number] DOB: 05-21-1956    ADMISSION DATE:  08/23/2013 CONSULTATION DATE:  9/8  REFERRING MD :  Elisabeth Pigeon  PRIMARY SERVICE: [facility]ad   CHIEF COMPLAINT:  Severe sepsis and respiratory distress   BRIEF PATIENT DESCRIPTION:  57 y.o. male w/ h/o  metastatic adenocarcinoma, peritoneal carcinomatosis status post exhaustive chemo (not well enough to receive for > 9 mo now)  extensive debulking procedures and abdominal surgeries  which has left him with multiple drains and fistulas, on chronic TPN via a PICC line, chronic PEG for gastric decompression, history of small bowel obstruction status post jejunal stent, ileostomy, cholecystitis status post cholecystostomy and chronic indwelling drain as well as chronic pleurex cath for chronic effusion (? Malignant). Admitted on 9/7 w/ fever, dyspnea and CXR c/w PNA. PCCM asked to see on 9/8 for sepsis and resp failure.   SIGNIFICANT EVENTS / STUDIES:  9/9 ct chest -Multifocal interstitial and airspace disease throughout the  lungs bilaterally, favored to predominately reflect underlying  pulmonary edema. Given the cardiomegaly and bilateral pleural  effusions, findings are favored to reflect congestive heart  failure. Superimposed infection with multilobar pneumonia is  difficult to entirely exclude, particularly in light of the  partially loculated pleural effusions bilaterally. Clinical  correlation is recommended.  9/10 lasix drip, neg 3 liters, impoved resp status  LINES / TUBES: PICC (chronic) remove 9/8 picc planned  CULTURES: MRSA screen 9/7: positive  BCX2 9/7: yeast >>>candida albicans>>>  ANTIBIOTICS: Cefepime 9/8>>> Flagyl 9/8>>>9/10 vanc 9/8>>>9/10 mycafungin 9/8>>>  ROS    SUBJECTIVE:  Neg 3 liters  VITAL SIGNS: Temp:  [96.3 F (35.7 C)-97.8 F (36.6 C)] 97.3 F (36.3 C) (09/10 0400) Pulse Rate:  [71-82] 78 (09/10 0800) Resp:  [23-39] 31 (09/10 0800) BP:  (92-126)/(54-80) 109/65 mmHg (09/10 0800) SpO2:  [94 %-100 %] 100 % (09/10 0800) Weight:  [95.4 kg (210 lb 5.1 oz)] 95.4 kg (210 lb 5.1 oz) (09/10 0800) HEMODYNAMICS:   VENTILATOR SETTINGS:   INTAKE / OUTPUT: Intake/Output     09/09 0701 - 09/10 0700 09/10 0701 - 09/11 0700   I.V. (mL/kg) 1143.5 (10.9) 75 (0.8)   Other 100    IV Piggyback 1150    Total Intake(mL/kg) 2393.5 (22.8) 75 (0.8)   Urine (mL/kg/hr) 3225 (1.3) 200 (0.9)   Drains 2070 (0.8)    Stool 30 (0)    Chest Tube     Total Output 5325 200   Net -2931.5 -125        Emesis Occurrence  1 x     PHYSICAL EXAMINATION: General:  Chronically ill, awake, no pain Neuro:  Awake, nonfocal exam HEENT:  Neck veins flat Cardiovascular:  rrr s1 s1 s 2 Lungs:  Decreased on right pleurex drain, more clear anterior Abdomen:  Mid abd ostomy bags draining.  Musculoskeletal:  Generalized weakness  Skin:  Edema   LABS:  CBC Recent Labs     08/23/13  0930  08/24/13  0625  08/25/13  0334  WBC  10.5  10.0  8.0  HGB  8.1*  7.2*  7.3*  HCT  25.7*  22.5*  23.5*  PLT  180  162  166   Coag's Recent Labs     08/23/13  1425  APTT  37  INR  1.71*   BMET Recent Labs     08/25/13  0334  08/25/13  1235  08/26/13  0015  NA  143  144  146*  K  3.9  3.6  3.4*  CL  115*  115*  115*  CO2  20  20  23   BUN  39*  38*  35*  CREATININE  1.30  1.26  1.26  GLUCOSE  68*  67*  62*   Electrolytes Recent Labs     08/23/13  1425   08/25/13  0334  08/25/13  1235  08/26/13  0015  CALCIUM   --    < >  9.1  9.3  9.4  MG  2.4   --    --   2.3  2.0  PHOS  2.6   --    --   5.0*  5.1*   < > = values in this interval not displayed.   Sepsis Markers Recent Labs     08/24/13  1148  08/26/13  0330  PROCALCITON  5.95  3.28   ABG No results found for this basename: PHART, PCO2ART, PO2ART,  in the last 72 hours Liver Enzymes Recent Labs     08/23/13  0930  08/24/13  0625  08/25/13  0334  AST  37  34  56*  ALT  49  43  56*   ALKPHOS  208*  165*  168*  BILITOT  2.2*  2.2*  2.7*  ALBUMIN  2.0*  1.9*  1.9*   Cardiac Enzymes Recent Labs     08/23/13  0930  08/24/13  1148  PROBNP  9174.0*  5661.0*   Glucose Recent Labs     08/24/13  0756  08/25/13  0802  08/25/13  0826  08/26/13  0752  08/26/13  0843  GLUCAP  85  58*  107*  68*  72    Imaging Ct Chest Wo Contrast  08/24/2013   *RADIOLOGY REPORT*  Clinical Data: Evaluate for infiltrates.  CT CHEST WITHOUT CONTRAST  Technique:  Multidetector CT imaging of the chest was performed following the standard protocol without IV contrast.  Comparison: Chest CT 11/09/2011.  Findings:  Mediastinum: Heart size is mildly enlarged. Trace amount of pericardial fluid and/or thickening, unlikely to be of any hemodynamic significance at this time.  No associated pericardial calcification.  Mild atherosclerosis of the thoracic aorta. Dilatation of the pulmonic trunk (4.3 cm in diameter), suggestive of elevated pulmonary artery pressures. No pathologically enlarged mediastinal or hilar lymph nodes. Please note that accurate exclusion of hilar adenopathy is limited on noncontrast CT scans. Esophagus is unremarkable in appearance.  Lungs/Pleura: Patchy areas of ground-glass attenuation with extensive interlobular septal thickening and profound thickening of the peribronchovascular interstitium, favored to represent pulmonary edema.  Small to moderate bilateral pleural effusions, both of which are partially loculated with fluid in the major fissures bilaterally.  A small-bore right-sided chest tube extends into the posterior aspect of the right pleural space.  Upper Abdomen: An open anterior abdominal wound is incompletely visualized.  Trace volume of ascites.  Tubing is present within the stomach, but incompletely visualized.  This is not a nasogastric tube.  Musculoskeletal: There are no aggressive appearing lytic or blastic lesions noted in the visualized portions of the skeleton.   IMPRESSION: 1.  Multifocal interstitial and airspace disease throughout the lungs bilaterally, favored to predominately reflect underlying pulmonary edema.  Given the cardiomegaly and bilateral pleural effusions, findings are favored to reflect congestive heart failure.  Superimposed infection with multilobar pneumonia is difficult to entirely exclude, particularly in light of the partially loculated pleural effusions bilaterally.  Clinical correlation is recommended.  2.  Support apparatus and postoperative changes, as above.   Original Report Authenticated By: Trudie Reed, M.D.     CXR: vasc congestion vs multi-lobar PNA   ASSESSMENT / PLAN:  PULMONARY A:  PNA (high risk aspiration, res organism) Respiratory distress  Chronic pleural effusions States that his pleural effusion cytology was negative but we do not have confirmatory labs. CT suggests volume excess/ pulmonary edema and PNA Cannot exclude mets secondary to volume, and motion P:   Supplemental oxygen HCAP coverage Diuresis maintain ABX, pct is eleavted which should not rise from fungus Intubation would be medically ineffective  CARDIOVASCULAR A:  Severe sepsis (see ID section) Severe CM-->new EF 30-35% (was 50-55%), this could be a septic CM from fungemia  P: Lasix gtt to avoid hypotension Negative balance 1 liter goal today Treat sepsis, fungemia  RENAL A:   Hyperchloremia, mild hypernatremia Gross overload, new cardiomyoapthy P:   KVO IVF  F/u chemistry bid while on drip Empiric KCL per primary team maintain lasix  Drip at 5  GASTROINTESTINAL A:   Metastatic adenocarcinoma, peritoneal carcinomatosis status post extensive debulking procedures and abdominal surgeries  multiple drains and fistulas, on chronic TPN via a PICC line, chronic PEG for gastric decompression, history of small bowel obstruction status post jejunal stent, ileostomy, cholecystitis status post cholecystostomy and chronic indwelling  drain. P:   TPn will need to be held, restart likely in 24 hrs  Give longer holiday without TPN, access becoming limited, ensure repeat BC picc may be needed sooner  HEMATOLOGIC A:   Metastatic adenocarcinoma Has not had chemo in > 9 mo. Not a candidate for further chemo. Concerned that his cancer has progressed.  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 P:   Cont micafungin until sens done for albicans, not sure if lab does sens on candida albicans, could consider change to diflucan Dc vanc, flagyl Repeat BC, will need picc soon  ENDOCRINE A:  hypoglycemia P:   Trend glucose with addition d10  NEUROLOGIC A:  No acute issue  P:   Supportive care   TODAY'S SUMMARY: continue lasix drip, ID to see, consider change myco to diflucan, dc vanc, flagyl    I have personally obtained a history, examined the patient, evaluated laboratory and imaging results, formulated the assessment and plan and placed orders.  08/26/2013, 9:17 AM  Mcarthur Rossetti. Tyson Alias, MD, FACP Pgr: [phone] [facility]

## 2013-08-26 NOTE — Progress Notes (Signed)
RM 1227  Cecile Hearing                                [facility] SW Note:  Pt awakened to name. Denied pain and discomfort. He stated that he has been told that he is getting better and he expressed that he is "keeping the faith".  Reinforced for pt to continue relying on those things which bring him strength to cope. Pt reported feeling tired and unable to stay awake due to medications. Sw reassured him of continued support.Althia Forts, LCSW

## 2013-08-26 NOTE — Progress Notes (Signed)
Patient alert and oriented.  Patient experienced ~15 seconds of self resolved ventricular bigeminy. Potassium being replaced. MD notified.

## 2013-08-26 NOTE — Progress Notes (Signed)
[facility] PROGRESS NOTE  [name] EAV:[id number] DOB: August 20, 1956 DOA: 08/23/2013 PCP: [name], MD  Assessment/Plan: #1 acute hypoxic respiratory distress Likely multifactorial in nature secondary to acute CHF exacerbation/pulmonary edema and healthcare associated pneumonia in the setting of chronic pleural effusions. Cannot exclude metastatic disease. Pro BNP was elevated at 5661 on 9/8 /2014. Pro calcitonin level is slowly trending down. Will cycle cardiac enzymes every 6 hours x3. 2-D echo with EF of 30-35% with systolic function moderately to severely reduced. No vegetations noted. I/0. equal -2931.5 weight today is 95.4 kg from 105.1 kg(08/25/13) from 105.6kg (08/23/13). Continue oxygen, IV vancomycin, IV cefepime, IV Flagyl. On Lasix drip. Will start low dose beta blocker. Follow. PCCM of his following and appreciate input and recommendations.  #2 severe sepsis Likely secondary to healthcare associated pneumonia worse of a postobstructive process and anemia likely secondary to chronic PICC line as the source. PICC removed 08/24/13. Urine Legionella antigen negative. Urine pneumococcus antigen negative. Blood cultures positive for bandemia growing Candida albicans. Urine cultures negative. Pro calcitonin level slowly trending down at 3.28 today from 5.95. Repeat blood cultures pending. Continue empiric IV vancomycin, cefepime, Flagyl, micafungin. 2-D echo with no vegetations noted. PCCM ff  and appreciate input and recommendations. Will consult with ID for further evaluation and recommendations.  #3 acute systolic CHF exacerbation/cardiomyopathy Questionable etiology. Pro BNP was elevated at 5661 on 08/24/2013. 2-D echo with a depressed EF of 30-35% with moderately to severely reduced systolic function. No vegetations noted. Will cycle cardiac enzymes every 6 hours x3. I/O = -2631.5. Current weight is 95.4 kg from 105.6 kg on admission 08/23/2013. Patient was placed on a Lasix drip  which has since been discontinued. Will add low-dose beta blocker. And if patient remained hemodynamically stable may consider adding low-dose ACE inhibitor. Patient with a complex medical history and currently not suitable for invasive procedures at this point in time. Will treat medically. Follow.  #4 metastatic adenocarcinoma/peritoneal carcinomatosis status post extensive debulking procedures no abdominal surgeries, multiple drains and fistulas on chronic TPN via PICC line, chronic PEG for gastric decompression/history of small bowel obstruction status post jejunal stent/ileostomy/cholecystitis status post cholecystostomy/chronic indwelling cath Patient is currently n.p.o. TPN has been held and discontinued secondary to fungemia. PICC line removed. Patient has not had chemotherapy and greater than 9 months. Patient on home hospice however is full code. Follow.  #5 anemia of chronic disease Follow H&H. Transfusion threshold hemoglobin less than 7. Check an anemia panel.  #6 healthcare associated pneumonia/postobstructive pneumonia Urine Legionella and urine pneumococcus antigens are negative. Blood cultures with Candida albicans. Repeat blood cultures pending. Continue empiric IV vancomycin, cefepime, Flagyl. Pulmonary following and appreciate input and recommendations. ID consultation pending.  #7 fungemia Likely secondary to chronic PICC line. PICC line has been removed on 08/24/2013. 2-D echo with a depressed EF of 30-35% with no vegetations noted. Continue empiric IV micafungin. Consult with ID for further evaluation and recommendations.  #8 severe protein calorie malnutrition Secondary to problem #4. TPN on hold secondary to severe sepsis. Will monitor for now. Once repeat blood cultures are negative and final may consider placing PICC line backing and resuming nutrition. Follow for now.  #9 oral thrush On micafungin.  #10 hypokalemia Repleted.  #11 prophylaxis PPI for GI prophylaxis.  SCDs for DVT prophylaxis.   Code Status: Full Family Communication: Updated patient no family present. Disposition Plan: Keep the step down for now   Consultants:  PCCM: Dr. Tyson Alias 08/24/2013  Infectious disease Dr. Orvan Falconer  pending  Procedures:  Chest x-ray 08/23/2013  2-D echo 08/24/2013  CT CHEST  08/24/2013  PICC line removed 08/24/2013  Antibiotics: Vancomycin 08/23/2013 -->  Cefepime 08/23/2013 -->  Flagyl 08/24/2013 -->  Micafungin 08/24/2013 -->  Fluconazole 08/23/2013 --> 08/24/2013    HPI/Subjective: Patient states still short of breath however some improvement from admission. Patient wondering when TPN will be resumed.  Objective: Filed Vitals:   08/26/13 0800  BP: 109/65  Pulse: 78  Temp:   Resp: 31    Intake/Output Summary (Last 24 hours) at 08/26/13 0838 Last data filed at 08/26/13 0800  Gross per 24 hour  Intake   2379 ml  Output   5375 ml  Net  -2996 ml   Filed Weights   08/23/13 1200 08/25/13 0619 08/26/13 0800  Weight: 105.6 kg (232 lb 12.9 oz) 105.1 kg (231 lb 11.3 oz) 95.4 kg (210 lb 5.1 oz)    Exam:   General:  Laying in bed. Some use of accessory muscles of respirations. Talking in full sentences.  Cardiovascular: RRR  Respiratory: Bibasilar coarse BS  Abdomen: Soft/NT/ND/+BS/ Multiple drains on abdomen noted. Ileostomy, fistula and PEG noted. Pleural drain in place.  Extremities: No clubbing cyanosis or edema.  Neurological: Intact.  Data Reviewed: Basic Metabolic Panel:  Recent Labs Lab 08/23/13 0930 08/23/13 1425 08/24/13 0625 08/25/13 0334 08/25/13 1235 08/26/13 0015  NA 139  --  141 143 144 146*  K 4.0  --  4.4 3.9 3.6 3.4*  CL 113*  --  114* 115* 115* 115*  CO2 19  --  19 20 20 23   GLUCOSE 99  --  85 68* 67* 62*  BUN 40*  --  37* 39* 38* 35*  CREATININE 1.52*  --  1.34 1.30 1.26 1.26  CALCIUM 8.8  --  9.1 9.1 9.3 9.4  MG  --  2.4  --   --  2.3 2.0  PHOS  --  2.6  --   --  5.0* 5.1*   Liver Function  Tests:  Recent Labs Lab 08/23/13 0930 08/24/13 0625 08/25/13 0334  AST 37 34 56*  ALT 49 43 56*  ALKPHOS 208* 165* 168*  BILITOT 2.2* 2.2* 2.7*  PROT 7.1 6.7 6.8  ALBUMIN 2.0* 1.9* 1.9*    Recent Labs Lab 08/24/13 1140  LIPASE 38  AMYLASE 39   No results found for this basename: AMMONIA,  in the last 168 hours CBC:  Recent Labs Lab 08/23/13 0930 08/24/13 0625 08/25/13 0334  WBC 10.5 10.0 8.0  NEUTROABS 8.3*  --   --   HGB 8.1* 7.2* 7.3*  HCT 25.7* 22.5* 23.5*  MCV 93.1 92.6 92.9  PLT 180 162 166   Cardiac Enzymes: No results found for this basename: CKTOTAL, CKMB, CKMBINDEX, TROPONINI,  in the last 168 hours BNP (last 3 results)  Recent Labs  12/22/12 1700 08/23/13 0930 08/24/13 1148  PROBNP 33.8 9174.0* 5661.0*   CBG:  Recent Labs Lab 08/24/13 0756 08/25/13 0802 08/25/13 0826  GLUCAP 85 58* 107*    Recent Results (from the past 240 hour(s))  CULTURE, BLOOD (ROUTINE X 2)     Status: None   Collection Time    08/23/13  9:30 AM      Result Value Range Status   Specimen Description BLOOD PICC LEFT ARM  5 ML IN [facility] BOTTLE   Final   Special Requests NONE   Final   Culture  Setup Time     Final  Value: 08/23/2013 16:16     Performed at [facility]   Culture     Final   Value: CANDIDA ALBICANS     Note: Gram Stain Report Called to,Read Back By and Verified With: TIA JOHNSON 08/24/13 0940 BY SMITHERSJ     Performed at [facility]   Report Status 08/25/2013 FINAL   Final  MRSA PCR SCREENING     Status: Abnormal   Collection Time    08/23/13 11:52 AM      Result Value Range Status   MRSA by PCR POSITIVE (*) NEGATIVE Final   Comment:            The GeneXpert MRSA Assay (FDA     approved for NASAL specimens     only), is one component of a     comprehensive MRSA colonization     surveillance program. It is not     intended to diagnose MRSA     infection nor to guide or     monitor treatment for     MRSA infections.      RESULT CALLED TO, READ BACK BY AND VERIFIED WITH:     ASHLEY,A AT 1358 ON [id number] BY POTEAT,S  URINE CULTURE     Status: None   Collection Time    08/23/13  3:27 PM      Result Value Range Status   Specimen Description URINE, RANDOM   Final   Special Requests NONE   Final   Culture  Setup Time     Final   Value: 08/23/2013 22:07     Performed at [facility] Count     Final   Value: NO GROWTH     Performed at [facility]   Culture     Final   Value: NO GROWTH     Performed at [facility]   Report Status 08/24/2013 FINAL   Final  CULTURE, BLOOD (ROUTINE X 2)     Status: None   Collection Time    08/25/13 12:35 PM      Result Value Range Status   Specimen Description BLOOD RIGHT HAND   Final   Special Requests BOTTLES DRAWN AEROBIC ONLY 4CC   Final   Culture  Setup Time     Final   Value: 08/25/2013 15:06     Performed at [facility]   Culture     Final   Value:        BLOOD CULTURE RECEIVED NO GROWTH TO DATE CULTURE WILL BE HELD FOR 5 DAYS BEFORE ISSUING A FINAL NEGATIVE REPORT     Performed at [facility]   Report Status PENDING   Incomplete  CULTURE, BLOOD (ROUTINE X 2)     Status: None   Collection Time    08/25/13 12:35 PM      Result Value Range Status   Specimen Description BLOOD LEFT ARM   Final   Special Requests     Final   Value: BOTTLES DRAWN AEROBIC AND ANAEROBIC 10CC AER 5CC ANA   Culture  Setup Time     Final   Value: 08/25/2013 15:06     Performed at [facility]   Culture     Final   Value:        BLOOD CULTURE RECEIVED NO GROWTH TO DATE CULTURE WILL BE HELD FOR 5 DAYS BEFORE ISSUING A FINAL NEGATIVE REPORT     Performed at [facility]   Report Status PENDING   Incomplete     Studies: Ct Chest Wo Contrast  08/24/2013   *RADIOLOGY REPORT*  Clinical Data: Evaluate for infiltrates.  CT CHEST WITHOUT CONTRAST  Technique:  Multidetector CT imaging of the chest was performed following the  standard protocol without IV contrast.  Comparison: Chest CT 11/09/2011.  Findings:  Mediastinum: Heart size is mildly enlarged. Trace amount of pericardial fluid and/or thickening, unlikely to be of any hemodynamic significance at this time.  No associated pericardial calcification.  Mild atherosclerosis of the thoracic aorta. Dilatation of the pulmonic trunk (4.3 cm in diameter), suggestive of elevated pulmonary artery pressures. No pathologically enlarged mediastinal or hilar lymph nodes. Please note that accurate exclusion of hilar adenopathy is limited on noncontrast CT scans. Esophagus is unremarkable in appearance.  Lungs/Pleura: Patchy areas of ground-glass attenuation with extensive interlobular septal thickening and profound thickening of the peribronchovascular interstitium, favored to represent pulmonary edema.  Small to moderate bilateral pleural effusions, both of which are partially loculated with fluid in the major fissures bilaterally.  A small-bore right-sided chest tube extends into the posterior aspect of the right pleural space.  Upper Abdomen: An open anterior abdominal wound is incompletely visualized.  Trace volume of ascites.  Tubing is present within the stomach, but incompletely visualized.  This is not a nasogastric tube.  Musculoskeletal: There are no aggressive appearing lytic or blastic lesions noted in the visualized portions of the skeleton.  IMPRESSION: 1.  Multifocal interstitial and airspace disease throughout the lungs bilaterally, favored to predominately reflect underlying pulmonary edema.  Given the cardiomegaly and bilateral pleural effusions, findings are favored to reflect congestive heart failure.  Superimposed infection with multilobar pneumonia is difficult to entirely exclude, particularly in light of the partially loculated pleural effusions bilaterally.  Clinical correlation is recommended. 2.  Support apparatus and postoperative changes, as above.   Original Report  Authenticated By: Trudie Reed, M.D.    Scheduled Meds: . antiseptic oral rinse  15 mL Mouth Rinse q12n4p  . ceFEPime (MAXIPIME) IV  1 g Intravenous Q8H  . chlorhexidine  15 mL Mouth Rinse BID  . Chlorhexidine Gluconate Cloth  6 each Topical Q0600  . fentaNYL  100 mcg Transdermal Q72H  . metronidazole  500 mg Intravenous Q6H  . micafungin (MYCAMINE) IV  100 mg Intravenous Daily  . mupirocin ointment  1 application Nasal BID  . pantoprazole  40 mg Intravenous q morning - 10a  . sodium chloride  3 mL Intravenous Q12H  . vancomycin  1,250 mg Intravenous Q12H   Continuous Infusions: . dextrose 75 mL/hr at 08/26/13 0750  . furosemide (LASIX) infusion 5 mg/hr (08/25/13 2200)  . HYDROmorphone 1 mg/hr (08/26/13 4098)    Principal Problem:   Acute respiratory failure with hypoxia Active Problems:   Abdominal malignant neoplasm   Metastatic adenocarcinoma   Peritoneal carcinomatosis   Anemia of chronic disease   Colo-enteric fistula   Severe protein-calorie malnutrition   Fever   Acute renal failure   Sepsis   HCAP (healthcare-associated pneumonia)   Fungemia   Hypokalemia   Hypernatremia    Time spent: > 40 mins    [facility]  [facility] Pager [phone]. If 7PM-7AM, please contact night-coverage at [email or web address], password [facility] 08/26/2013, 8:38 AM  LOS: 3 days

## 2013-08-27 ENCOUNTER — Inpatient Hospital Stay (HOSPITAL_COMMUNITY): Payer: 59

## 2013-08-27 DIAGNOSIS — I509 Heart failure, unspecified: Secondary | ICD-10-CM

## 2013-08-27 DIAGNOSIS — R918 Other nonspecific abnormal finding of lung field: Secondary | ICD-10-CM

## 2013-08-27 DIAGNOSIS — I5021 Acute systolic (congestive) heart failure: Secondary | ICD-10-CM

## 2013-08-27 DIAGNOSIS — R634 Abnormal weight loss: Secondary | ICD-10-CM

## 2013-08-27 DIAGNOSIS — A419 Sepsis, unspecified organism: Secondary | ICD-10-CM

## 2013-08-27 LAB — CBC WITH DIFFERENTIAL/PLATELET
Eosinophils Absolute: 0.1 10*3/uL (ref 0.0–0.7)
Hemoglobin: 8.6 g/dL — ABNORMAL LOW (ref 13.0–17.0)
Lymphs Abs: 0.8 10*3/uL (ref 0.7–4.0)
Monocytes Relative: 7 % (ref 3–12)
Neutro Abs: 5.1 10*3/uL (ref 1.7–7.7)
Neutrophils Relative %: 78 % — ABNORMAL HIGH (ref 43–77)
Platelets: 158 10*3/uL (ref 150–400)
RBC: 2.93 MIL/uL — ABNORMAL LOW (ref 4.22–5.81)
WBC: 6.5 10*3/uL (ref 4.0–10.5)

## 2013-08-27 LAB — BASIC METABOLIC PANEL
BUN: 33 mg/dL — ABNORMAL HIGH (ref 6–23)
BUN: 31 mg/dL — ABNORMAL HIGH (ref 6–23)
Chloride: 111 mEq/L (ref 96–112)
GFR calc Af Amer: 65 mL/min — ABNORMAL LOW (ref >90)
GFR calc Af Amer: 69 mL/min — ABNORMAL LOW (ref >90)
GFR calc non Af Amer: 56 mL/min — ABNORMAL LOW (ref >90)
GFR calc non Af Amer: 60 mL/min — ABNORMAL LOW (ref >90)
Glucose, Bld: 109 mg/dL — ABNORMAL HIGH (ref 70–99)
Potassium: 3.2 mEq/L — ABNORMAL LOW (ref 3.5–5.1)
Potassium: 3.3 mEq/L — ABNORMAL LOW (ref 3.5–5.1)
Sodium: 148 mEq/L — ABNORMAL HIGH (ref 135–145)
Sodium: 146 mEq/L — ABNORMAL HIGH (ref 135–145)

## 2013-08-27 LAB — PHOSPHORUS
Phosphorus: 4.7 mg/dL — ABNORMAL HIGH (ref 2.3–4.6)
Phosphorus: 4.1 mg/dL (ref 2.3–4.6)

## 2013-08-27 LAB — GLUCOSE, CAPILLARY: Glucose-Capillary: 104 mg/dL — ABNORMAL HIGH (ref 70–99)

## 2013-08-27 MED ORDER — MAGNESIUM SULFATE 50 % IJ SOLN
3.0000 g | Freq: Once | INTRAVENOUS | Status: AC
  Administered 2013-08-27: 3 g via INTRAVENOUS
  Filled 2013-08-27: qty 6

## 2013-08-27 MED ORDER — FUROSEMIDE 10 MG/ML IJ SOLN
20.0000 mg | Freq: Every day | INTRAMUSCULAR | Status: DC
  Administered 2013-08-28 – 2013-09-02 (×6): 20 mg via INTRAVENOUS
  Filled 2013-08-27 (×6): qty 2

## 2013-08-27 MED ORDER — CHLORHEXIDINE GLUCONATE CLOTH 2 % EX PADS
6.0000 | MEDICATED_PAD | Freq: Every day | CUTANEOUS | Status: AC
  Administered 2013-08-27 – 2013-08-28 (×2): 6 via TOPICAL

## 2013-08-27 NOTE — Progress Notes (Signed)
Occupational Therapy Treatment Patient Details Name: [name] MRN: [id number] DOB: 1956-06-20 Today's Date: 08/27/2013 Time: 9528-4132 OT Time Calculation (min): 25 min  OT Assessment / Plan / Recommendation  History of present illness 57 y.o. male with complicated medical problems including metastatic adenocarcinoma, peritoneal carcinomatosis status post extensive debulking procedures and abdominal surgeries by Dr. [name] which has left him with multiple drains and fistulas, on chronic TPN via a PICC line, chronic PEG for gastric decompression, history of small bowel obstruction status post jejunal stent, ileostomy, cholecystitis status post cholecystostomy and chronic indwelling drain. He presented to [facility] ED with main concern of progressively worsening shortness of breath, present at rest and with minimal exertion, associated with subjective fevers, chills, malaise, started several days prior to the admission and with no specific alleviating factors. Pt is currently under hospice care but is full code. He has pleurex cath placed for chronic symptoms management and explains that has been helping him. He denies any specific focal neurological symptoms but explains he is tired and feels week. He denies any specific abdominal or urinary concerns.    OT comments  Pt with increased activity tolerance this OT visit  Follow Up Recommendations  SNF       Equipment Recommendations  None recommended by OT       Frequency Min 2X/week   Progress towards OT Goals Progress towards OT goals: Progressing toward goals  Plan Discharge plan needs to be updated           ADL  Grooming: Performed;Wash/dry face;Wash/dry hands;Teeth care;Set up Where Assessed - Grooming: Supine, head of bed up Transfers/Ambulation Related to ADLs: Pt much improved this OT visit. Pt able to tolerate grooming, as well as BUE exercise. ADL Comments: Pt did report he has not been out of bed in 3 weeks.  Pt does agree he  needs to go somewhere to get stronger before going home as help is inconsistent      OT Goals(current goals can now be found in the care plan section)    Visit Information  Last OT Received On: 08/27/13 Assistance Needed: +2 History of Present Illness: 57 y.o. male with complicated medical problems including metastatic adenocarcinoma, peritoneal carcinomatosis status post extensive debulking procedures and abdominal surgeries by Dr. [name] which has left him with multiple drains and fistulas, on chronic TPN via a PICC line, chronic PEG for gastric decompression, history of small bowel obstruction status post jejunal stent, ileostomy, cholecystitis status post cholecystostomy and chronic indwelling drain. He presented to [facility] ED with main concern of progressively worsening shortness of breath, present at rest and with minimal exertion, associated with subjective fevers, chills, malaise, started several days prior to the admission and with no specific alleviating factors. Pt is currently under hospice care but is full code. He has pleurex cath placed for chronic symptoms management and explains that has been helping him. He denies any specific focal neurological symptoms but explains he is tired and feels week. He denies any specific abdominal or urinary concerns.           Cognition  Cognition Arousal/Alertness: Awake/alert Behavior During Therapy: WFL for tasks assessed/performed Overall Cognitive Status: Within Functional Limits for tasks assessed       Exercises  Shoulder Exercises Shoulder Flexion: AROM;20 reps;Supine Elbow Flexion: AROM;20 reps;Supine;Other (comment) (HOB raised) Elbow Extension: AROM;20 reps      End of Session OT - End of Session Activity Tolerance: Patient tolerated treatment well Patient left: in bed;with call bell/phone within  reach  GO     [name] 08/27/2013, 12:16 PM

## 2013-08-27 NOTE — Progress Notes (Signed)
PULMONARY  / CRITICAL CARE MEDICINE  Name: [name] MRN: [id number] DOB: 04-06-1956    ADMISSION DATE:  08/23/2013 CONSULTATION DATE:  9/8  REFERRING MD :  Elisabeth Pigeon  PRIMARY SERVICE: [facility]   CHIEF COMPLAINT: Respiratory distress  BRIEF PATIENT DESCRIPTION:  57 yo male admitted with fever, dyspnea and concern for pneumonia.  PCCM consulted to assess sepsis and respiratory failure.  Was enrolled in home hospice prior to admission.  He has hx of metastatic adenocarcinoma, peritoneal carcinomatosis status post exhaustive chemo (not well enough to receive for > 9 mo now)  extensive debulking procedures and abdominal surgeries which has left him with multiple drains and fistulas, on chronic TPN via a PICC line, chronic PEG for gastric decompression, hx of SBO s/p jejunal stent, ileostomy, cholecystitis s/pcholecystostomy and chronic indwelling drain as well as chronic pleurex cath for chronic effusion (? Malignant).  SIGNIFICANT EVENTS: 9/07 Admit with fever, respiratory distress 9/08 PCCM consulted 9/10 ID consulted, started on lasix gtt  STUDIES:  9/08 CT chest >> pulmonary edema, cardiomyopathy, b/l pleural effusions 9/08 Echo >> EF 30 to 35%, grade 1 diastolic dysfx, mod RV systolic dysfx  LINES / TUBES: PICC (chronic) >> 9/8  CULTURES: MRSA screen 9/7 >> positive  Urine 9/07 >> negative Blood 9/07 >> Candida albicans Blood 9/09 >>   ANTIBIOTICS: Cefepime 9/8>>> Flagyl 9/8>>>9/10 vanc 9/8>>>9/10 mycafungin 9/8>>>  SUBJECTIVE:  Breathing better.  Denies chest pain.  VITAL SIGNS: Temp:  [97.2 F (36.2 C)-99 F (37.2 C)] 98 F (36.7 C) (09/11 0400) Pulse Rate:  [37-103] 86 (09/11 0600) Resp:  [20-34] 20 (09/11 0600) BP: (85-119)/(46-73) 88/55 mmHg (09/11 0600) SpO2:  [93 %-100 %] 100 % (09/11 0600) Weight:  [197 lb 5 oz (89.5 kg)] 197 lb 5 oz (89.5 kg) (09/11 0600) Room air  INTAKE / OUTPUT: Intake/Output     09/10 0701 - 09/11 0700 09/11 0701 - 09/12 0700   P.O. 120    I.V. (mL/kg) 1104.3 (12.3)    Other 90    IV Piggyback 200    Total Intake(mL/kg) 1514.3 (16.9)    Urine (mL/kg/hr) 4975 (2.3)    Drains 1600 (0.7)    Stool 750 (0.3)    Chest Tube 50 (0)    Total Output 7375     Net -5860.7          Emesis Occurrence 2 x      PHYSICAL EXAMINATION: General: no distress Neuro:  Awake, follows commands, normal strength HEENT:  No sinus tenderness Cardiovascular: regular, no murmur Lungs:  Decreased on right pleurex drain, more clear anterior Abdomen:  Mid abd ostomy bags draining.  Musculoskeletal:  Generalized weakness, muscle wasting Skin: 1+ edema  LABS:  CBC Recent Labs     08/25/13  0334  08/27/13  0705  WBC  8.0  6.5  HGB  7.3*  8.6*  HCT  23.5*  27.0*  PLT  166  158   Coag's No results found for this basename: APTT, INR,  in the last 72 hours  BMET Recent Labs     08/26/13  0015  08/26/13  1130  08/26/13  2200  NA  146*  146*  145  K  3.4*  3.3*  3.1*  CL  115*  112  107  CO2  23  24  28   BUN  35*  32*  30*  CREATININE  1.26  1.27  1.33  GLUCOSE  62*  79  83   Electrolytes Recent Labs  08/26/13  0015  08/26/13  1130  08/26/13  2200  CALCIUM  9.4  9.5  9.8  MG  2.0  1.7  1.6  PHOS  5.1*  5.2*  4.6   Sepsis Markers Recent Labs     08/24/13  1148  08/26/13  0330  PROCALCITON  5.95  3.28   ABG No results found for this basename: PHART, PCO2ART, PO2ART,  in the last 72 hours  Liver Enzymes Recent Labs     08/25/13  0334  AST  56*  ALT  56*  ALKPHOS  168*  BILITOT  2.7*  ALBUMIN  1.9*   Cardiac Enzymes Recent Labs     08/24/13  1148  08/26/13  1010  08/26/13  1523  08/26/13  2200  TROPONINI   --   <0.30  <0.30  <0.30  PROBNP  5661.0*   --    --    --    Glucose Recent Labs     08/25/13  0802  08/25/13  0826  08/26/13  0752  08/26/13  0843  08/27/13  0754  GLUCAP  58*  107*  68*  72  104*    Imaging Dg Chest Port 1 View  08/27/2013   *RADIOLOGY REPORT*  Clinical  Data: Follow up pleural effusions and CHF  PORTABLE CHEST - 1 VIEW  Comparison: Portable chest x-ray of 08/23/2013 and CT chest of 08/24/2013  Findings: There has been some improvement in the pulmonary vascular congestion and mild edema pattern.  Mild cardiomegaly is stable and there may be tiny effusions remaining.  IMPRESSION: Improvement in mild CHF with small effusions.   Original Report Authenticated By: Dwyane Dee, M.D.    ASSESSMENT / PLAN:  PULMONARY A:  Acute respiratory failure >> more likely from pulmonary edema in setting of acute systolic heart failure.  Rapid improvement clinically and radiographically with diuresis speaks against pneumonia. Chronic pleural effusion in setting of malignancy. P:   -goal even to negative fluid balance -f/u CXR as needed -oxygen as needed to keep SpO2 > 92%  CARDIOVASCULAR A:  Severe sepsis 2nd to fungemia >> improved. Acute systolic heart failure with acute pulmonary edema >> ? If from sepsis. P: -change lasix to 20 mg daily -monitor hemodynamics -may need cardiology consult >> defer to primary team  RENAL A:   Hypokalemia. Hypervolemia. P:   -monitor renal fx, urine outpt -f/u and replace electrolytes as needed  GASTROINTESTINAL A:   Protein calorie malnutrition. S/p multiple abdominal surgeries in setting of metastatic adenocarcinoma >> has not been able to tolerate chemo for > 9 months; was enrolled in home hospice as outpt. Chronic cholecystitis. P:   -TPN per primary team -protonix for SUP  HEMATOLOGIC A:   Anemia of chronic disease and critical illness. P:  -f/u CBC  INFECTIOUS A:   Fungemia-->chronic PICC the likely source, PICC out 9/8 Doubt pneumonia. P:   -abx, anti-fungal per ID >> could de-escalate pneumonia coverage  ENDOCRINE A: Hypoglycemia. P:   -monitor CBG's  NEUROLOGIC A: Chronic cancer pain. P:   -pain control per primary team  Needs further discussions about goals of care >> he is  enrolled in home hospice, but is listed as full code.  PCCM will sign off.  Please call if additional help needed.  Discussed plan with Dr. Janee Morn.  Coralyn Helling, MD [facility] Pulmonary/Critical Care 08/27/2013, 9:08 AM Pager:  [phone] After 3pm call: [phone]

## 2013-08-27 NOTE — Progress Notes (Signed)
[facility] PROGRESS NOTE  [name] NFA:[id number] DOB: 1956-09-06 DOA: 08/23/2013 PCP: [name], MD  Assessment/Plan: #1 acute hypoxic respiratory distress Likely multifactorial in nature secondary to acute CHF exacerbation/pulmonary edema and probable healthcare associated pneumonia in the setting of chronic pleural effusions. Cannot exclude metastatic disease. Pro BNP was elevated at 5661 on 9/8 /2014. Pro calcitonin level is slowly trending down. Cardiac enzymes negative x3. 2-D echo with EF of 30-35% with systolic function moderately to severely reduced. No vegetations noted. I/0. equal -10,123.2L since admission; weight today is 89.5kg from 95.4 kg from 105.1 kg(08/25/13) from 105.6kg (08/23/13). Continue oxygen IV cefepime. Lasix gtt to be discontinued secondary to low BPand patient to be started IV lasix if BP permits. Continue low dose beta blocker if BP allows. Follow. PCCM of his following and appreciate input and recommendations.  #2 severe sepsis Likely secondary to fungemia secondary to chronic PICC line vs ?? probable healthcare associated pneumonia vs postobstructive process .PICC removed 08/24/13. Urine Legionella antigen negative. Urine pneumococcus antigen negative. Blood cultures positive for fungemia growing Candida albicans. Urine cultures negative. Pro calcitonin level slowly trending down at 3.28 (08/26/13)  from 5.95. Repeat blood cultures pending. Continue  IV cefepime, and micafungin. 2-D echo with no vegetations noted. IV vancomycin and flagyl discontinued yesterday. PCCM ff  and appreciate input and recommendations. ID ff and appreciate input and rxcs.  #3 acute systolic CHF exacerbation/ nonischemic cardiomyopathy Questionable etiology. Maybe secondary to sepsis. Pro BNP was elevated at 5661 on 08/24/2013. 2-D echo with a depressed EF of 30-35% with moderately to severely reduced systolic function. No vegetations noted. Cardiac enzymes negative x3. I/O =  -10,123.2 since admission. Current weight is 89.5 kg from 95.4 kg from 105.6 kg on admission 08/23/2013. Patient was placed on a Lasix drip which will be discontinued and changed to IV lasix today if BP withholds. Continue low-dose beta blocker. And if patient remained hemodynamically stable may consider adding low-dose ACE inhibitor. Patient with a complex medical history and currently not a candidate for invasive procedures at this point in time. Will treat medically. Follow.  #4 metastatic adenocarcinoma/peritoneal carcinomatosis status post extensive debulking procedures no abdominal surgeries, multiple drains and fistulas on chronic TPN via PICC line, chronic PEG for gastric decompression/history of small bowel obstruction status post jejunal stent/ileostomy/cholecystitis status post cholecystostomy/chronic indwelling cath Patient is currently n.p.o. TPN has been held and discontinued secondary to fungemia. PICC line removed. Patient has not had chemotherapy and greater than 9 months. Patient on home hospice however is full code. Once repeat blood cultures negative will reinsert PICC and resume TPN per ID rxcs. Follow.  #5 anemia of chronic disease Follow H&H. Transfusion threshold hemoglobin less than 7.   #6 ?? healthcare associated pneumonia/postobstructive pneumonia Urine Legionella and urine pneumococcus antigens are negative. Blood cultures with Candida albicans. Repeat blood cultures pending. Continue empiric IV  cefepime. Vancomycin and flagyl d/c'd yesterday. Pulmonary and ID following and appreciate input and recommendations.   #7 fungemia Likely secondary to chronic PICC line. PICC line has been removed on 08/24/2013. 2-D echo with a depressed EF of 30-35% with no vegetations noted. Continue empiric IV micafungin. Will need minimum of 2 weeks per ID. ID ff and appreciate input and rxcs.  #8 severe protein calorie malnutrition Secondary to problem #4. TPN on hold secondary to severe  sepsis. Will monitor for now. Once repeat blood cultures are negative and final may consider placing PICC line back and resuming nutrition. Follow for now.  #9  oral thrush On micafungin.  #10 hypokalemia Replete.  #11 prophylaxis PPI for GI prophylaxis. SCDs for DVT prophylaxis.   Code Status: Full Family Communication: Updated patient no family present. Disposition Plan: Keep the step down for now   Consultants:  PCCM: Dr. Tyson Alias 08/24/2013  Infectious disease Dr. Orvan Falconer 08/26/13  Procedures:  Chest x-ray 08/23/2013, 08/27/13  2-D echo 08/24/2013  CT CHEST  08/24/2013  PICC line removed 08/24/2013  Antibiotics: Vancomycin 08/23/2013 -->08/26/13  Cefepime 08/23/2013 -->  Flagyl 08/24/2013 --> 08/26/13 Micafungin 08/24/2013 -->  Fluconazole 08/23/2013 --> 08/24/2013    HPI/Subjective: Patient states feeling better. Patient wondering when TPN will be resumed.  Objective: Filed Vitals:   08/27/13 0600  BP: 88/55  Pulse: 86  Temp:   Resp: 20    Intake/Output Summary (Last 24 hours) at 08/27/13 0907 Last data filed at 08/27/13 0600  Gross per 24 hour  Intake 1389.33 ml  Output   6375 ml  Net -4985.67 ml   Filed Weights   08/25/13 0619 08/26/13 0800 08/27/13 0600  Weight: 105.1 kg (231 lb 11.3 oz) 95.4 kg (210 lb 5.1 oz) 89.5 kg (197 lb 5 oz)    Exam:   General:  Laying in bed. Talking in full sentences.  Cardiovascular: RRR  Respiratory: Bibasilar coarse BS  Abdomen: Soft/NT/ND/+BS/ Multiple drains on abdomen noted. Ileostomy, fistula and PEG noted. Pleural drain in place.  Extremities: No clubbing cyanosis or edema.  Neurological: Intact.  Data Reviewed: Basic Metabolic Panel:  Recent Labs Lab 08/23/13 1425  08/25/13 0334 08/25/13 1235 08/26/13 0015 08/26/13 1130 08/26/13 2200  NA  --   < > 143 144 146* 146* 145  K  --   < > 3.9 3.6 3.4* 3.3* 3.1*  CL  --   < > 115* 115* 115* 112 107  CO2  --   < > 20 20 23 24 28   GLUCOSE  --   < > 68*  67* 62* 79 83  BUN  --   < > 39* 38* 35* 32* 30*  CREATININE  --   < > 1.30 1.26 1.26 1.27 1.33  CALCIUM  --   < > 9.1 9.3 9.4 9.5 9.8  MG 2.4  --   --  2.3 2.0 1.7 1.6  PHOS 2.6  --   --  5.0* 5.1* 5.2* 4.6  < > = values in this interval not displayed. Liver Function Tests:  Recent Labs Lab 08/23/13 0930 08/24/13 0625 08/25/13 0334  AST 37 34 56*  ALT 49 43 56*  ALKPHOS 208* 165* 168*  BILITOT 2.2* 2.2* 2.7*  PROT 7.1 6.7 6.8  ALBUMIN 2.0* 1.9* 1.9*    Recent Labs Lab 08/24/13 1140  LIPASE 38  AMYLASE 39   No results found for this basename: AMMONIA,  in the last 168 hours CBC:  Recent Labs Lab 08/23/13 0930 08/24/13 0625 08/25/13 0334 08/27/13 0705  WBC 10.5 10.0 8.0 6.5  NEUTROABS 8.3*  --   --  5.1  HGB 8.1* 7.2* 7.3* 8.6*  HCT 25.7* 22.5* 23.5* 27.0*  MCV 93.1 92.6 92.9 92.2  PLT 180 162 166 158   Cardiac Enzymes:  Recent Labs Lab 08/26/13 1010 08/26/13 1523 08/26/13 2200  TROPONINI <0.30 <0.30 <0.30   BNP (last 3 results)  Recent Labs  12/22/12 1700 08/23/13 0930 08/24/13 1148  PROBNP 33.8 9174.0* 5661.0*   CBG:  Recent Labs Lab 08/25/13 0802 08/25/13 0826 08/26/13 0752 08/26/13 0843 08/27/13 0754  GLUCAP 58* 107* 68*  72 104*    Recent Results (from the past 240 hour(s))  CULTURE, BLOOD (ROUTINE X 2)     Status: None   Collection Time    08/23/13  9:30 AM      Result Value Range Status   Specimen Description BLOOD PICC LEFT ARM  5 ML IN [facility] BOTTLE   Final   Special Requests NONE   Final   Culture  Setup Time     Final   Value: 08/23/2013 16:16     Performed at [facility]   Culture     Final   Value: CANDIDA ALBICANS     Note: Gram Stain Report Called to,Read Back By and Verified With: TIA JOHNSON 08/24/13 0940 BY SMITHERSJ     Performed at [facility]   Report Status 08/25/2013 FINAL   Final  MRSA PCR SCREENING     Status: Abnormal   Collection Time    08/23/13 11:52 AM      Result Value Range Status    MRSA by PCR POSITIVE (*) NEGATIVE Final   Comment:            The GeneXpert MRSA Assay (FDA     approved for NASAL specimens     only), is one component of a     comprehensive MRSA colonization     surveillance program. It is not     intended to diagnose MRSA     infection nor to guide or     monitor treatment for     MRSA infections.     RESULT CALLED TO, READ BACK BY AND VERIFIED WITH:     ASHLEY,A AT 1358 ON [id number] BY POTEAT,S  URINE CULTURE     Status: None   Collection Time    08/23/13  3:27 PM      Result Value Range Status   Specimen Description URINE, RANDOM   Final   Special Requests NONE   Final   Culture  Setup Time     Final   Value: 08/23/2013 22:07     Performed at [facility] Count     Final   Value: NO GROWTH     Performed at [facility]   Culture     Final   Value: NO GROWTH     Performed at [facility]   Report Status 08/24/2013 FINAL   Final  CULTURE, BLOOD (ROUTINE X 2)     Status: None   Collection Time    08/25/13 12:35 PM      Result Value Range Status   Specimen Description BLOOD RIGHT HAND   Final   Special Requests BOTTLES DRAWN AEROBIC ONLY 4CC   Final   Culture  Setup Time     Final   Value: 08/25/2013 15:06     Performed at [facility]   Culture     Final   Value:        BLOOD CULTURE RECEIVED NO GROWTH TO DATE CULTURE WILL BE HELD FOR 5 DAYS BEFORE ISSUING A FINAL NEGATIVE REPORT     Performed at [facility]   Report Status PENDING   Incomplete  CULTURE, BLOOD (ROUTINE X 2)     Status: None   Collection Time    08/25/13 12:35 PM      Result Value Range Status   Specimen Description BLOOD LEFT ARM   Final   Special Requests     Final  Value: BOTTLES DRAWN AEROBIC AND ANAEROBIC 10CC AER 5CC ANA   Culture  Setup Time     Final   Value: 08/25/2013 15:06     Performed at [facility]   Culture     Final   Value:        BLOOD CULTURE RECEIVED NO GROWTH TO DATE CULTURE WILL BE  HELD FOR 5 DAYS BEFORE ISSUING A FINAL NEGATIVE REPORT     Performed at [facility]   Report Status PENDING   Incomplete  CULTURE, BLOOD (ROUTINE X 2)     Status: None   Collection Time    08/26/13 10:10 AM      Result Value Range Status   Specimen Description BLOOD LEFT ARM   Final   Special Requests     Final   Value: BOTTLES DRAWN AEROBIC AND ANAEROBIC 5CC ANA 10CC AER   Culture  Setup Time     Final   Value: 08/26/2013 13:28     Performed at [facility]   Culture     Final   Value:        BLOOD CULTURE RECEIVED NO GROWTH TO DATE CULTURE WILL BE HELD FOR 5 DAYS BEFORE ISSUING A FINAL NEGATIVE REPORT     Performed at [facility]   Report Status PENDING   Incomplete  CULTURE, BLOOD (ROUTINE X 2)     Status: None   Collection Time    08/26/13 10:15 AM      Result Value Range Status   Specimen Description BLOOD RIGHT ARM   Final   Special Requests BOTTLES DRAWN AEROBIC AND ANAEROBIC 4CC   Final   Culture  Setup Time     Final   Value: 08/26/2013 13:28     Performed at [facility]   Culture     Final   Value:        BLOOD CULTURE RECEIVED NO GROWTH TO DATE CULTURE WILL BE HELD FOR 5 DAYS BEFORE ISSUING A FINAL NEGATIVE REPORT     Performed at [facility]   Report Status PENDING   Incomplete     Studies: Dg Chest Port 1 View  08/27/2013   *RADIOLOGY REPORT*  Clinical Data: Follow up pleural effusions and CHF  PORTABLE CHEST - 1 VIEW  Comparison: Portable chest x-ray of 08/23/2013 and CT chest of 08/24/2013  Findings: There has been some improvement in the pulmonary vascular congestion and mild edema pattern.  Mild cardiomegaly is stable and there may be tiny effusions remaining.  IMPRESSION: Improvement in mild CHF with small effusions.   Original Report Authenticated By: Dwyane Dee, M.D.    Scheduled Meds: . antiseptic oral rinse  15 mL Mouth Rinse q12n4p  . ceFEPime (MAXIPIME) IV  1 g Intravenous Q8H  . chlorhexidine  15 mL Mouth  Rinse BID  . Chlorhexidine Gluconate Cloth  6 each Topical Q0600  . fentaNYL  100 mcg Transdermal Q72H  . metoprolol  2.5 mg Intravenous Q8H  . micafungin (MYCAMINE) IV  100 mg Intravenous Daily  . mupirocin ointment  1 application Nasal BID  . pantoprazole  40 mg Intravenous q morning - 10a  . sodium chloride  3 mL Intravenous Q12H   Continuous Infusions: . dextrose 40 mL/hr at 08/26/13 0952  . furosemide (LASIX) infusion 5 mg/hr (08/25/13 2200)  . HYDROmorphone 1 mg/hr (08/26/13 7829)    Principal Problem:   Fungemia Active Problems:   Abdominal malignant neoplasm  Metastatic adenocarcinoma   Peritoneal carcinomatosis   Anemia of chronic disease   Colo-enteric fistula   Severe protein-calorie malnutrition   Acute respiratory failure with hypoxia   Acute renal failure   Sepsis   HCAP (healthcare-associated pneumonia)   Hypokalemia   Hypernatremia   Recurrent bacteremia   Pleural effusion    Time spent: > 40 mins    [facility]  [facility] Pager [phone]. If 7PM-7AM, please contact night-coverage at [email or web address], password [facility] 08/27/2013, 9:07 AM  LOS: 4 days

## 2013-08-27 NOTE — Progress Notes (Signed)
[facility])   Hospice Home Care Chaplain Note  Patient (pt): [name]   Room: 1227  Hospice homecare chaplain visited to assess for spiritual needs.  Patient was alert and oriented and indicated he was in good spirits.  Pt's sister was at bedside.  Patient indicated he is feeling encouraged b/c he feels he is improving.  Patient and family are leaning into their faith for coping.  Chaplain utilized active listening, normalizing feelings and affirming faith.  Chaplain provided pastoral presence and prayer with blessing.  Chaplain will continue to provide ongoing spiritual support and visit as needed.    Beverly Isley-Landreth ThM, BCCC [facility] Clinical Chaplain

## 2013-08-27 NOTE — Progress Notes (Signed)
Physical Therapy Treatment Patient Details Name: [name] MRN: [id number] DOB: 11-Sep-1956 Today's Date: 08/27/2013 Time: 4098-1191 PT Time Calculation (min): 20 min  PT Assessment / Plan / Recommendation  History of Present Illness 57 y.o. male with complicated medical problems including metastatic adenocarcinoma, peritoneal carcinomatosis status post extensive debulking procedures and abdominal surgeries by Dr. [name] which has left him with multiple drains and fistulas, on chronic TPN via a PICC line, chronic PEG for gastric decompression, history of small bowel obstruction status post jejunal stent, ileostomy, cholecystitis status post cholecystostomy and chronic indwelling drain. He presented to [facility] ED with main concern of progressively worsening shortness of breath, present at rest and with minimal exertion, associated with subjective fevers, chills, malaise, started several days prior to the admission and with no specific alleviating factors. Pt is currently under hospice care but is full code. He has pleurex cath placed for chronic symptoms management and explains that has been helping him. He denies any specific focal neurological symptoms but explains he is tired and feels week. He denies any specific abdominal or urinary concerns.    PT Comments   Progressing; VSS today with EOB activity; pt is motivated to do more therapy and progress  Follow Up Recommendations  SNF     Does the patient have the potential to tolerate intense rehabilitation     Barriers to Discharge        Equipment Recommendations  None recommended by PT    Recommendations for Other Services    Frequency Min 3X/week   Progress towards PT Goals Progress towards PT goals: Progressing toward goals  Plan Current plan remains appropriate    Precautions / Restrictions Precautions Precautions: Fall   Pertinent Vitals/Pain Denies pain    Mobility  Bed Mobility Bed Mobility: Supine to Sit;Sit to  Supine Supine to Sit: 4: Min assist;With rails Sit to Supine: 4: Min assist;HOB flat Details for Bed Mobility Assistance: cues for technique  and use of UEs; pt scoots EOB with min assist and cues for safety Transfers Transfers: Not assessed    Exercises General Exercises - Lower Extremity Ankle Circles/Pumps: AROM;Both;10 reps Quad Sets: AROM;Both;10 reps Gluteal Sets: AROM;Both;10 reps Long Arc Quad: AROM;Both;10 reps;Seated Shoulder Exercises Shoulder Flexion: AROM;20 reps;Supine Elbow Flexion: AROM;20 reps;Supine;Other (comment) (HOB raised) Elbow Extension: AROM;20 reps   PT Diagnosis:    PT Problem List:   PT Treatment Interventions:     PT Goals (current goals can now be found in the care plan section) Acute Rehab PT Goals Patient Stated Goal: to walk Time For Goal Achievement: 09/07/13 Potential to Achieve Goals: Fair  Visit Information  Last PT Received On: 08/27/13 Assistance Needed: +2 History of Present Illness: 57 y.o. male with complicated medical problems including metastatic adenocarcinoma, peritoneal carcinomatosis status post extensive debulking procedures and abdominal surgeries by Dr. [name] which has left him with multiple drains and fistulas, on chronic TPN via a PICC line, chronic PEG for gastric decompression, history of small bowel obstruction status post jejunal stent, ileostomy, cholecystitis status post cholecystostomy and chronic indwelling drain. He presented to [facility] ED with main concern of progressively worsening shortness of breath, present at rest and with minimal exertion, associated with subjective fevers, chills, malaise, started several days prior to the admission and with no specific alleviating factors. Pt is currently under hospice care but is full code. He has pleurex cath placed for chronic symptoms management and explains that has been helping him. He denies any specific focal neurological symptoms but explains  he is tired and feels week. He  denies any specific abdominal or urinary concerns.     Subjective Data  Patient Stated Goal: to walk   Cognition  Cognition Arousal/Alertness: Awake/alert Behavior During Therapy: WFL for tasks assessed/performed Overall Cognitive Status: Within Functional Limits for tasks assessed    Balance     End of Session PT - End of Session Activity Tolerance: Patient tolerated treatment well Patient left: in bed;with call bell/phone within reach   GP     [facility] 08/27/2013, 12:55 PM

## 2013-08-27 NOTE — Progress Notes (Signed)
Pt visiting with Chaplain when arrived.  He was alert and oriented.  Affect bright.  Per request of Chalmers Cater RN, [facility] liaison, Clinical research associate followed up with pt regarding whether he would want a family meeting with Palliative Medicine to establish goals of care and education about his disease progression.  Pt admitted that he  does not know where the cancer is and or understand what is happening to him.  He would like to know more about his disease.    Pt would want for his family to be involved.   Reviewed chart.  This is a related but not covered admission secondary to [employer]  Writer will contact Valente David RN regarding above.  Please contact [facility] at [phone] with any pt movements.  [name] RN, [facility] Home[facility] RN, [facility] [place].  [phone]

## 2013-08-27 NOTE — Progress Notes (Signed)
Patient ID: [name], male   DOB: 06-02-1956, 57 y.o.   MRN: [id number]         [facility]    Date of Admission:  08/23/2013           Day 5 antifungal therapy        Day 5 cefepime Principal Problem:   Fungemia Active Problems:   Sepsis   HCAP (healthcare-associated pneumonia)   Recurrent bacteremia   Abdominal malignant neoplasm   Metastatic adenocarcinoma   Peritoneal carcinomatosis   Anemia of chronic disease   Colo-enteric fistula   Severe protein-calorie malnutrition   Acute respiratory failure with hypoxia   Acute renal failure   Hypokalemia   Hypernatremia   Pleural effusion   . antiseptic oral rinse  15 mL Mouth Rinse q12n4p  . ceFEPime (MAXIPIME) IV  1 g Intravenous Q8H  . chlorhexidine  15 mL Mouth Rinse BID  . Chlorhexidine Gluconate Cloth  6 each Topical Q0600  . fentaNYL  100 mcg Transdermal Q72H  . furosemide  20 mg Intravenous Daily  . metoprolol  2.5 mg Intravenous Q8H  . micafungin (MYCAMINE) IV  100 mg Intravenous Daily  . mupirocin ointment  1 application Nasal BID  . pantoprazole  40 mg Intravenous q morning - 10a  . sodium chloride  3 mL Intravenous Q12H    Subjective: He is feeling much better. He is no longer having any shortness of breath. He has had no visual changes. Review of Systems: Pertinent items are noted in HPI.  Past Medical History  Diagnosis Date  . Hypertension   . GERD (gastroesophageal reflux disease)   . Small bowel obstruction 2013  . 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 Acute cholecystitis s/p perc draianage [id number] 12/20/2012  . Anemia of chronic disease 12/19/2012  . 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 MRSA bacteremia 01/04/2013  . SBO (small bowel obstruction) 11/07/2011  . Septic shock due to Staphylococcus aureus 12/24/2012  . Severe protein-calorie malnutrition 01/17/2013  . Hospice care  patient     History  Substance Use Topics  . Smoking status: Never Smoker   . Smokeless tobacco: Never Used  . Alcohol Use: No     Comment: Stopped in July    Family History  Problem Relation Age of Onset  . Lung cancer Brother   . Hypertension Mother   . Hypertension Father   . Heart failure Father     Allergies  Allergen Reactions  . Percocet [Oxycodone-Acetaminophen] Nausea And Vomiting    hallucination    Objective: Temp:  [97.4 F (36.3 C)-99 F (37.2 C)] 98.2 F (36.8 C) (09/11 1200) Pulse Rate:  [75-103] 87 (09/11 1000) Resp:  [20-34] 25 (09/11 1000) BP: (77-119)/(55-73) 89/58 mmHg (09/11 1000) SpO2:  [93 %-100 %] 100 % (09/11 1000) Weight:  [89.5 kg (197 lb 5 oz)] 89.5 kg (197 lb 5 oz) (09/11 0600)  General: He is smiling and in good spirits Skin: No rash Lungs: Clear Cor: Regular S1 and S2 no murmurs Abdomen: Nontender  Lab Results Lab Results  Component Value Date   WBC 6.5 08/27/2013   HGB 8.6* 08/27/2013   HCT 27.0* 08/27/2013   MCV 92.2 08/27/2013   PLT 158 08/27/2013    Lab Results  Component Value Date   CREATININE 1.37* 08/27/2013   BUN 33* 08/27/2013   NA  148* 08/27/2013   K 3.2* 08/27/2013   CL 111 08/27/2013   CO2 27 08/27/2013    Lab Results  Component Value Date   ALT 56* 08/25/2013   AST 56* 08/25/2013   ALKPHOS 168* 08/25/2013   BILITOT 2.7* 08/25/2013      Microbiology: Recent Results (from the past 240 hour(s))  CULTURE, BLOOD (ROUTINE X 2)     Status: None   Collection Time    08/23/13  9:30 AM      Result Value Range Status   Specimen Description BLOOD PICC LEFT ARM  5 ML IN [facility] BOTTLE   Final   Special Requests NONE   Final   Culture  Setup Time     Final   Value: 08/23/2013 16:16     Performed at [facility]   Culture     Final   Value: CANDIDA ALBICANS     Note: Gram Stain Report Called to,Read Back By and Verified With: TIA JOHNSON 08/24/13 0940 BY [email or web address]     Performed at [facility]   Report Status  PENDING   Incomplete  MRSA PCR SCREENING     Status: Abnormal   Collection Time    08/23/13 11:52 AM      Result Value Range Status   MRSA by PCR POSITIVE (*) NEGATIVE Final   Comment:            The GeneXpert MRSA Assay (FDA     approved for NASAL specimens     only), is one component of a     comprehensive MRSA colonization     surveillance program. It is not     intended to diagnose MRSA     infection nor to guide or     monitor treatment for     MRSA infections.     RESULT CALLED TO, READ BACK BY AND VERIFIED WITH:     ASHLEY,A AT 1358 ON [id number] BY POTEAT,S  URINE CULTURE     Status: None   Collection Time    08/23/13  3:27 PM      Result Value Range Status   Specimen Description URINE, RANDOM   Final   Special Requests NONE   Final   Culture  Setup Time     Final   Value: 08/23/2013 22:07     Performed at [facility] Count     Final   Value: NO GROWTH     Performed at [facility]   Culture     Final   Value: NO GROWTH     Performed at [facility]   Report Status 08/24/2013 FINAL   Final  CULTURE, BLOOD (ROUTINE X 2)     Status: None   Collection Time    08/25/13 12:35 PM      Result Value Range Status   Specimen Description BLOOD RIGHT HAND   Final   Special Requests BOTTLES DRAWN AEROBIC ONLY 4CC   Final   Culture  Setup Time     Final   Value: 08/25/2013 15:06     Performed at [facility]   Culture     Final   Value:        BLOOD CULTURE RECEIVED NO GROWTH TO DATE CULTURE WILL BE HELD FOR 5 DAYS BEFORE ISSUING A FINAL NEGATIVE REPORT     Performed at [facility]   Report Status PENDING   Incomplete  CULTURE, BLOOD (ROUTINE  X 2)     Status: None   Collection Time    08/25/13 12:35 PM      Result Value Range Status   Specimen Description BLOOD LEFT ARM   Final   Special Requests     Final   Value: BOTTLES DRAWN AEROBIC AND ANAEROBIC 10CC AER 5CC ANA   Culture  Setup Time     Final   Value: 08/25/2013  15:06     Performed at [facility]   Culture     Final   Value:        BLOOD CULTURE RECEIVED NO GROWTH TO DATE CULTURE WILL BE HELD FOR 5 DAYS BEFORE ISSUING A FINAL NEGATIVE REPORT     Performed at [facility]   Report Status PENDING   Incomplete  CULTURE, BLOOD (ROUTINE X 2)     Status: None   Collection Time    08/26/13 10:10 AM      Result Value Range Status   Specimen Description BLOOD LEFT ARM   Final   Special Requests     Final   Value: BOTTLES DRAWN AEROBIC AND ANAEROBIC 5CC ANA 10CC AER   Culture  Setup Time     Final   Value: 08/26/2013 13:28     Performed at [facility]   Culture     Final   Value:        BLOOD CULTURE RECEIVED NO GROWTH TO DATE CULTURE WILL BE HELD FOR 5 DAYS BEFORE ISSUING A FINAL NEGATIVE REPORT     Performed at [facility]   Report Status PENDING   Incomplete  CULTURE, BLOOD (ROUTINE X 2)     Status: None   Collection Time    08/26/13 10:15 AM      Result Value Range Status   Specimen Description BLOOD RIGHT ARM   Final   Special Requests BOTTLES DRAWN AEROBIC AND ANAEROBIC 4CC   Final   Culture  Setup Time     Final   Value: 08/26/2013 13:28     Performed at [facility]   Culture     Final   Value:        BLOOD CULTURE RECEIVED NO GROWTH TO DATE CULTURE WILL BE HELD FOR 5 DAYS BEFORE ISSUING A FINAL NEGATIVE REPORT     Performed at [facility]   Report Status PENDING   Incomplete    Studies/Results: Dg Chest Port 1 View  08/27/2013   *RADIOLOGY REPORT*  Clinical Data: Follow up pleural effusions and CHF  PORTABLE CHEST - 1 VIEW  Comparison: Portable chest x-ray of 08/23/2013 and CT chest of 08/24/2013  Findings: There has been some improvement in the pulmonary vascular congestion and mild edema pattern.  Mild cardiomegaly is stable and there may be tiny effusions remaining.  IMPRESSION: Improvement in mild CHF with small effusions.   Original Report Authenticated By: Dwyane Dee, M.D.     Assessment: His had dramatic weight loss since admission and his chest x-ray shows rapid improvement in his infiltrates. I suspect that his her problem was volume overload and heart failure without pneumonia. I agree with stopping cefepime at this time. The primary problem precipitating this admission was fungemia with Candida albicans related to his TPN therapy. So far repeat blood cultures are negative. He does not appear to have any apparent complication of the fungemia. I will continue micafungin. His blood cultures remain negative through tomorrow it should be safe and reasonable to  proceed with replacement of a PICC. He will need 14 days of micafungin therapy starting from the date of his first negative blood culture.  Plan: 1. Continue micafungin 2. Await results of repeat blood cultures in susceptibility testing 3. Discontinue cefepime  Cliffton Asters, MD [facility] [phone] pager   [phone] cell 08/27/2013, 3:05 PM

## 2013-08-28 DIAGNOSIS — C801 Malignant (primary) neoplasm, unspecified: Secondary | ICD-10-CM

## 2013-08-28 LAB — BASIC METABOLIC PANEL
BUN: 30 mg/dL — ABNORMAL HIGH (ref 6–23)
CO2: 29 mEq/L (ref 19–32)
Calcium: 9.5 mg/dL (ref 8.4–10.5)
Creatinine, Ser: 1.13 mg/dL (ref 0.50–1.35)

## 2013-08-28 LAB — CBC
HCT: 29.8 % — ABNORMAL LOW (ref 39.0–52.0)
MCH: 28.9 pg (ref 26.0–34.0)
MCV: 91.7 fL (ref 78.0–100.0)
Platelets: 180 10*3/uL (ref 150–400)
RBC: 3.25 MIL/uL — ABNORMAL LOW (ref 4.22–5.81)

## 2013-08-28 LAB — TYPE AND SCREEN: Unit division: 0

## 2013-08-28 LAB — GLUCOSE, CAPILLARY: Glucose-Capillary: 131 mg/dL — ABNORMAL HIGH (ref 70–99)

## 2013-08-28 MED ORDER — METOPROLOL TARTRATE 1 MG/ML IV SOLN
2.5000 mg | Freq: Two times a day (BID) | INTRAVENOUS | Status: DC
  Administered 2013-08-28 – 2013-09-02 (×10): 2.5 mg via INTRAVENOUS
  Filled 2013-08-28 (×10): qty 5

## 2013-08-28 MED ORDER — POTASSIUM CHLORIDE 10 MEQ/100ML IV SOLN
10.0000 meq | INTRAVENOUS | Status: AC
  Administered 2013-08-28 (×5): 10 meq via INTRAVENOUS
  Filled 2013-08-28 (×5): qty 100

## 2013-08-28 NOTE — Progress Notes (Signed)
[facility] PROGRESS NOTE  DESTEN MANOR ZOX:[id number] DOB: [birth year] DOA: 08/23/2013 PCP: [name], MD  Assessment/Plan: #1 acute hypoxic respiratory distress Likely secondary to acute CHF exacerbation/pulmonary edema. Cannot exclude metastatic disease. Pro BNP was elevated at 5661 on 9/8 /2014. Pro calcitonin level is slowly trending down. Cardiac enzymes negative x3. 2-D echo with EF of 30-35% with systolic function moderately to severely reduced. No vegetations noted. I/0. equal -13.267L since admission; weight today is 89.5kg from 95.4 kg from 105.1 kg(08/25/13) from 105.6kg (08/23/13). Lasix gtt has been changed to lasix 20mg  IV Daily. Continue low dose beta blocker if BP allows. Follow. PCCM of his following and appreciate input and recommendations.  #2 severe sepsis Likely secondary to fungemia secondary to chronic PICC line. Doubt if PNA as patient improved rapidly with diureses. PICC removed 08/24/13. Urine Legionella antigen negative. Urine pneumococcus antigen negative. Blood cultures positive for fungemia growing Candida albicans. Urine cultures negative. Pro calcitonin level slowly trending down at 3.28 (08/26/13)  from 5.95. Repeat blood cultures pending. Continue  IV micafungin. 2-D echo with no vegetations noted. IV vancomycin and flagyl and cefepime discontinued. ID ff and appreciate input and rxcs.  #3 acute systolic CHF exacerbation/ nonischemic cardiomyopathy Questionable etiology. Maybe secondary to sepsis. Pro BNP was elevated at 5661 on 08/24/2013. 2-D echo with a depressed EF of 30-35% with moderately to severely reduced systolic function. No vegetations noted. Cardiac enzymes negative x3. I/O = -[id number] since admission. Current weight is 89.5 kg from 95.4 kg from 105.6 kg on admission 08/23/2013. Patient was placed on a Lasix drip which will be discontinued and changed to IV lasix daily if BP withholds. Continue low-dose beta blocker. And if patient remained  hemodynamically stable may consider adding low-dose ACE inhibitor. Patient with a complex medical history and currently not a candidate for invasive procedures at this point in time. Will treat medically. Follow.  #4 metastatic adenocarcinoma/peritoneal carcinomatosis status post extensive debulking procedures no abdominal surgeries, multiple drains and fistulas on chronic TPN via PICC line, chronic PEG for gastric decompression/history of small bowel obstruction status post jejunal stent/ileostomy/cholecystitis status post cholecystostomy/chronic indwelling cath Patient is currently n.p.o. TPN has been held and discontinued secondary to fungemia. PICC line removed. Patient has not had chemotherapy and greater than 9 months. Patient on home hospice however is full code. Once repeat blood cultures negative will reinsert PICC and resume TPN per ID rxcs. Follow.  #5 anemia of chronic disease Follow H&H. Transfusion threshold hemoglobin less than 7.   #6 ??? healthcare associated pneumonia/postobstructive pneumonia Urine Legionella and urine pneumococcus antigens are negative. Blood cultures with Candida albicans. Repeat blood cultures pending. Doubt if PNA.  IV  cefepime D/C'D yesterday. Vancomycin and flagyl d/c'd  2 days ago. Pulmonary and ID following and appreciate input and recommendations.   #7 fungemia Likely secondary to chronic PICC line. PICC line has been removed on 08/24/2013. 2-D echo with a depressed EF of 30-35% with no vegetations noted. Continue empiric IV micafungin. Will need minimum of 2 weeks per ID. ID ff and appreciate input and rxcs.  #8 severe protein calorie malnutrition Secondary to problem #4. TPN on hold secondary to severe sepsis. Will monitor for now. Once repeat blood cultures are negative and final may consider placing PICC line back and resuming nutrition. Follow for now.  #9 oral thrush On micafungin.  #10 hypokalemia Secondary to diureses. Replete.  #11  prophylaxis PPI for GI prophylaxis. SCDs for DVT prophylaxis.   Code Status: Full Family Communication:  Updated patient no family present. Disposition Plan: Keep the step down for now   Consultants:  PCCM: Dr. Tyson Alias 08/24/2013  Infectious disease Dr. Orvan Falconer 08/26/13  Procedures:  Chest x-ray 08/23/2013, 08/27/13  2-D echo 08/24/2013  CT CHEST  08/24/2013  PICC line removed 08/24/2013  Antibiotics: Vancomycin 08/23/2013 -->08/26/13  Cefepime 08/23/2013 --> 08/27/13 Flagyl 08/24/2013 --> 08/26/13 Micafungin 08/24/2013 -->  Fluconazole 08/23/2013 --> 08/24/2013    HPI/Subjective: Patient states breathing has improved since admission.  Patient wondering when TPN will be resumed.  Objective: Filed Vitals:   08/28/13 0800  BP: 104/67  Pulse: 74  Temp:   Resp: 20    Intake/Output Summary (Last 24 hours) at 08/28/13 0934 Last data filed at 08/28/13 0600  Gross per 24 hour  Intake    960 ml  Output   4220 ml  Net  -3260 ml   Filed Weights   08/26/13 0800 08/27/13 0600 08/28/13 0400  Weight: 95.4 kg (210 lb 5.1 oz) 89.5 kg (197 lb 5 oz) 89.5 kg (197 lb 5 oz)    Exam:   General:  Laying in bed. Talking in full sentences.  Cardiovascular: RRR  Respiratory: CTAB  Abdomen: Soft/NT/ND/+BS/ Multiple drains on abdomen noted. Ileostomy, fistula and PEG noted. Pleural drain in place.  Extremities: No clubbing cyanosis or edema.  Neurological: Intact.  Data Reviewed: Basic Metabolic Panel:  Recent Labs Lab 08/26/13 0015 08/26/13 1130 08/26/13 2200 08/27/13 1130 08/27/13 2315  NA 146* 146* 145 148* 146*  K 3.4* 3.3* 3.1* 3.2* 3.3*  CL 115* 112 107 111 108  CO2 23 24 28 27 29   GLUCOSE 62* 79 83 109* 110*  BUN 35* 32* 30* 33* 31*  CREATININE 1.26 1.27 1.33 1.37* 1.30  CALCIUM 9.4 9.5 9.8 9.5 9.6  MG 2.0 1.7 1.6 1.5 2.3  PHOS 5.1* 5.2* 4.6 4.7* 4.1   Liver Function Tests:  Recent Labs Lab 08/23/13 0930 08/24/13 0625 08/25/13 0334  AST 37 34 56*  ALT  49 43 56*  ALKPHOS 208* 165* 168*  BILITOT 2.2* 2.2* 2.7*  PROT 7.1 6.7 6.8  ALBUMIN 2.0* 1.9* 1.9*    Recent Labs Lab 08/24/13 1140  LIPASE 38  AMYLASE 39   No results found for this basename: AMMONIA,  in the last 168 hours CBC:  Recent Labs Lab 08/23/13 0930 08/24/13 0625 08/25/13 0334 08/27/13 0705 08/28/13 0325  WBC 10.5 10.0 8.0 6.5 6.8  NEUTROABS 8.3*  --   --  5.1  --   HGB 8.1* 7.2* 7.3* 8.6* 9.4*  HCT 25.7* 22.5* 23.5* 27.0* 29.8*  MCV 93.1 92.6 92.9 92.2 91.7  PLT 180 162 166 158 180   Cardiac Enzymes:  Recent Labs Lab 08/26/13 1010 08/26/13 1523 08/26/13 2200  TROPONINI <0.30 <0.30 <0.30   BNP (last 3 results)  Recent Labs  12/22/12 1700 08/23/13 0930 08/24/13 1148  PROBNP 33.8 9174.0* 5661.0*   CBG:  Recent Labs Lab 08/25/13 0802 08/25/13 0826 08/26/13 0752 08/26/13 0843 08/27/13 0754  GLUCAP 58* 107* 68* 72 104*    Recent Results (from the past 240 hour(s))  CULTURE, BLOOD (ROUTINE X 2)     Status: None   Collection Time    08/23/13  9:30 AM      Result Value Range Status   Specimen Description BLOOD PICC LEFT ARM  5 ML IN [facility] BOTTLE   Final   Special Requests NONE   Final   Culture  Setup Time     Final  Value: 08/23/2013 16:16     Performed at [facility]   Culture     Final   Value: CANDIDA ALBICANS     Note: Gram Stain Report Called to,Read Back By and Verified With: TIA JOHNSON 08/24/13 0940 BY [email or web address]     Performed at [facility]   Report Status PENDING   Incomplete  MRSA PCR SCREENING     Status: Abnormal   Collection Time    08/23/13 11:52 AM      Result Value Range Status   MRSA by PCR POSITIVE (*) NEGATIVE Final   Comment:            The GeneXpert MRSA Assay (FDA     approved for NASAL specimens     only), is one component of a     comprehensive MRSA colonization     surveillance program. It is not     intended to diagnose MRSA     infection nor to guide or     monitor treatment for      MRSA infections.     RESULT CALLED TO, READ BACK BY AND VERIFIED WITH:     ASHLEY,A AT 1358 ON [id number] BY POTEAT,S  URINE CULTURE     Status: None   Collection Time    08/23/13  3:27 PM      Result Value Range Status   Specimen Description URINE, RANDOM   Final   Special Requests NONE   Final   Culture  Setup Time     Final   Value: 08/23/2013 22:07     Performed at [facility] Count     Final   Value: NO GROWTH     Performed at [facility]   Culture     Final   Value: NO GROWTH     Performed at [facility]   Report Status 08/24/2013 FINAL   Final  CULTURE, BLOOD (ROUTINE X 2)     Status: None   Collection Time    08/25/13 12:35 PM      Result Value Range Status   Specimen Description BLOOD RIGHT HAND   Final   Special Requests BOTTLES DRAWN AEROBIC ONLY 4CC   Final   Culture  Setup Time     Final   Value: 08/25/2013 15:06     Performed at [facility]   Culture     Final   Value:        BLOOD CULTURE RECEIVED NO GROWTH TO DATE CULTURE WILL BE HELD FOR 5 DAYS BEFORE ISSUING A FINAL NEGATIVE REPORT     Performed at [facility]   Report Status PENDING   Incomplete  CULTURE, BLOOD (ROUTINE X 2)     Status: None   Collection Time    08/25/13 12:35 PM      Result Value Range Status   Specimen Description BLOOD LEFT ARM   Final   Special Requests     Final   Value: BOTTLES DRAWN AEROBIC AND ANAEROBIC 10CC AER 5CC ANA   Culture  Setup Time     Final   Value: 08/25/2013 15:06     Performed at [facility]   Culture     Final   Value:        BLOOD CULTURE RECEIVED NO GROWTH TO DATE CULTURE WILL BE HELD FOR 5 DAYS BEFORE ISSUING A FINAL NEGATIVE REPORT     Performed at [facility]   Report Status PENDING   Incomplete  CULTURE, BLOOD (ROUTINE X 2)     Status: None   Collection Time    08/26/13 10:10 AM      Result Value Range Status   Specimen Description BLOOD LEFT ARM   Final   Special Requests     Final    Value: BOTTLES DRAWN AEROBIC AND ANAEROBIC 5CC ANA 10CC AER   Culture  Setup Time     Final   Value: 08/26/2013 13:28     Performed at [facility]   Culture     Final   Value:        BLOOD CULTURE RECEIVED NO GROWTH TO DATE CULTURE WILL BE HELD FOR 5 DAYS BEFORE ISSUING A FINAL NEGATIVE REPORT     Performed at [facility]   Report Status PENDING   Incomplete  CULTURE, BLOOD (ROUTINE X 2)     Status: None   Collection Time    08/26/13 10:15 AM      Result Value Range Status   Specimen Description BLOOD RIGHT ARM   Final   Special Requests BOTTLES DRAWN AEROBIC AND ANAEROBIC 4CC   Final   Culture  Setup Time     Final   Value: 08/26/2013 13:28     Performed at [facility]   Culture     Final   Value:        BLOOD CULTURE RECEIVED NO GROWTH TO DATE CULTURE WILL BE HELD FOR 5 DAYS BEFORE ISSUING A FINAL NEGATIVE REPORT     Performed at [facility]   Report Status PENDING   Incomplete     Studies: Dg Chest Port 1 View  08/27/2013   *RADIOLOGY REPORT*  Clinical Data: Follow up pleural effusions and CHF  PORTABLE CHEST - 1 VIEW  Comparison: Portable chest x-ray of 08/23/2013 and CT chest of 08/24/2013  Findings: There has been some improvement in the pulmonary vascular congestion and mild edema pattern.  Mild cardiomegaly is stable and there may be tiny effusions remaining.  IMPRESSION: Improvement in mild CHF with small effusions.   Original Report Authenticated By: Dwyane Dee, M.D.    Scheduled Meds: . antiseptic oral rinse  15 mL Mouth Rinse q12n4p  . chlorhexidine  15 mL Mouth Rinse BID  . Chlorhexidine Gluconate Cloth  6 each Topical Q0600  . fentaNYL  100 mcg Transdermal Q72H  . furosemide  20 mg Intravenous Daily  . metoprolol  2.5 mg Intravenous Q12H  . micafungin (MYCAMINE) IV  100 mg Intravenous Daily  . mupirocin ointment  1 application Nasal BID  . pantoprazole  40 mg Intravenous q morning - 10a  . sodium chloride  3 mL Intravenous  Q12H   Continuous Infusions: . dextrose 40 mL/hr at 08/28/13 0534  . HYDROmorphone 1 mg/hr (08/27/13 1026)    Principal Problem:   Acute respiratory failure with hypoxia Active Problems:   Abdominal malignant neoplasm   Metastatic adenocarcinoma   Peritoneal carcinomatosis   Anemia of chronic disease   Colo-enteric fistula   Severe protein-calorie malnutrition   Acute renal failure   Sepsis   Fungemia   Hypokalemia   Hypernatremia   Recurrent bacteremia   Pleural effusion    Time spent: > 40 mins    [facility]  [facility] Pager [phone]. If 7PM-7AM, please contact night-coverage at [email or web address], password [facility] 08/28/2013, 9:34 AM  LOS: 5 days

## 2013-08-28 NOTE — Progress Notes (Signed)
Alan Simmonds NP updated on issue with pleurex cath and he spoke with patient and clarified orders. Pt in agreement with plan.[name], Georga Hacking, RN

## 2013-08-28 NOTE — Progress Notes (Addendum)
Thank you for consulting the Palliative Medicine Team at [facility] to meet your patient's and family's needs.   The reason that you asked [place] to see your patient is  For Goals of Care  We have scheduled your patient for a meeting: Sunday 08/30/13 at 2pm  The Surrogate decision make is: Wife Ygnacio Fecteau [phone] [id number] [phone]  Other family members that need to be present:  Pt ok with Brother Casimiro Needle and America Brown being involved in conversation, also  Your patient is able/unable to participate: yes   Additional narrative: Pt is current [facility] pt, PMT consulted for GOC. Left VM w/wife; scheduled GOC with son Shamar for Sunday. Pt alert, conversive, agreeable to meeting. Left PMT phone number as need arise.  Have also communicated with [facility] RN [name].

## 2013-08-28 NOTE — Progress Notes (Signed)
[facility]  Patient Status:   Active with [facility] this readmission  [facility] is providing the following services: longstanding pt with [facility] currently receiving HH RN from [facility] and home TNA from [facility].   If patient discharges after hours, please call [phone].   [name] 08/28/2013, 6:43 AM

## 2013-08-28 NOTE — Progress Notes (Signed)
Patient ID: [name], male   DOB: [birth year], 57 y.o.   MRN: [id number]         [facility] for Infectious Disease    Date of Admission:  08/23/2013           Day 6 micafungin Principal Problem:   Acute respiratory failure with hypoxia Active Problems:   Fungemia   Sepsis   Recurrent bacteremia   Abdominal malignant neoplasm   Metastatic adenocarcinoma   Peritoneal carcinomatosis   Anemia of chronic disease   Colo-enteric fistula   Severe protein-calorie malnutrition   Acute renal failure   Hypokalemia   Hypernatremia   Pleural effusion   . antiseptic oral rinse  15 mL Mouth Rinse q12n4p  . chlorhexidine  15 mL Mouth Rinse BID  . Chlorhexidine Gluconate Cloth  6 each Topical Q0600  . fentaNYL  100 mcg Transdermal Q72H  . furosemide  20 mg Intravenous Daily  . metoprolol  2.5 mg Intravenous Q12H  . micafungin (MYCAMINE) IV  100 mg Intravenous Daily  . pantoprazole  40 mg Intravenous q morning - 10a  . potassium chloride  10 mEq Intravenous Q1 Hr x 5  . sodium chloride  3 mL Intravenous Q12H    Subjective: He is feeling much better.  Objective: Temp:  [97.4 F (36.3 C)-98.4 F (36.9 C)] 97.4 F (36.3 C) (09/12 1200) Pulse Rate:  [72-86] 78 (09/12 1300) Resp:  [20-30] 22 (09/12 1300) BP: (98-114)/(52-78) 98/78 mmHg (09/12 1300) SpO2:  [97 %-100 %] 98 % (09/12 1300) Weight:  [89.5 kg (197 lb 5 oz)] 89.5 kg (197 lb 5 oz) (09/12 0400)  General: Alert smiling Skin: No rash Lungs: Clear anteriorly Cor: Regular S1-S2 no murmurs Abdomen: Nontender   Lab Results Lab Results  Component Value Date   WBC 6.8 08/28/2013   HGB 9.4* 08/28/2013   HCT 29.8* 08/28/2013   MCV 91.7 08/28/2013   PLT 180 08/28/2013    Lab Results  Component Value Date   CREATININE 1.13 08/28/2013   BUN 30* 08/28/2013   NA 143 08/28/2013   K 3.1* 08/28/2013   CL 106 08/28/2013   CO2 29 08/28/2013    Lab Results  Component Value Date   ALT 56* 08/25/2013   AST 56* 08/25/2013   ALKPHOS 168* 08/25/2013   BILITOT 2.7* 08/25/2013      Microbiology: Recent Results (from the past 240 hour(s))  CULTURE, BLOOD (ROUTINE X 2)     Status: None   Collection Time    08/23/13  9:30 AM      Result Value Range Status   Specimen Description BLOOD PICC LEFT ARM  5 ML IN [facility] BOTTLE   Final   Special Requests NONE   Final   Culture  Setup Time     Final   Value: 08/23/2013 16:16     Performed at [facility]   Culture     Final   Value: CANDIDA ALBICANS     Note: Gram Stain Report Called to,Read Back By and Verified With: TIA JOHNSON 08/24/13 0940 BY SMITHERSJ     Performed at [facility]   Report Status PENDING   Incomplete  MRSA PCR SCREENING     Status: Abnormal   Collection Time    08/23/13 11:52 AM      Result Value Range Status   MRSA by PCR POSITIVE (*) NEGATIVE Final   Comment:            The  GeneXpert MRSA Assay (FDA     approved for NASAL specimens     only), is one component of a     comprehensive MRSA colonization     surveillance program. It is not     intended to diagnose MRSA     infection nor to guide or     monitor treatment for     MRSA infections.     RESULT CALLED TO, READ BACK BY AND VERIFIED WITH:     ASHLEY,A AT 1358 ON [id number] BY POTEAT,S  URINE CULTURE     Status: None   Collection Time    08/23/13  3:27 PM      Result Value Range Status   Specimen Description URINE, RANDOM   Final   Special Requests NONE   Final   Culture  Setup Time     Final   Value: 08/23/2013 22:07     Performed at [facility] Count     Final   Value: NO GROWTH     Performed at [facility]   Culture     Final   Value: NO GROWTH     Performed at [facility]   Report Status 08/24/2013 FINAL   Final  CULTURE, BLOOD (ROUTINE X 2)     Status: None   Collection Time    08/25/13 12:35 PM      Result Value Range Status   Specimen Description BLOOD RIGHT HAND   Final   Special Requests BOTTLES DRAWN AEROBIC ONLY 4CC    Final   Culture  Setup Time     Final   Value: 08/25/2013 15:06     Performed at [facility]   Culture     Final   Value:        BLOOD CULTURE RECEIVED NO GROWTH TO DATE CULTURE WILL BE HELD FOR 5 DAYS BEFORE ISSUING A FINAL NEGATIVE REPORT     Performed at [facility]   Report Status PENDING   Incomplete  CULTURE, BLOOD (ROUTINE X 2)     Status: None   Collection Time    08/25/13 12:35 PM      Result Value Range Status   Specimen Description BLOOD LEFT ARM   Final   Special Requests     Final   Value: BOTTLES DRAWN AEROBIC AND ANAEROBIC 10CC AER 5CC ANA   Culture  Setup Time     Final   Value: 08/25/2013 15:06     Performed at [facility]   Culture     Final   Value:        BLOOD CULTURE RECEIVED NO GROWTH TO DATE CULTURE WILL BE HELD FOR 5 DAYS BEFORE ISSUING A FINAL NEGATIVE REPORT     Performed at [facility]   Report Status PENDING   Incomplete  CULTURE, BLOOD (ROUTINE X 2)     Status: None   Collection Time    08/26/13 10:10 AM      Result Value Range Status   Specimen Description BLOOD LEFT ARM   Final   Special Requests     Final   Value: BOTTLES DRAWN AEROBIC AND ANAEROBIC 5CC ANA 10CC AER   Culture  Setup Time     Final   Value: 08/26/2013 13:28     Performed at [facility]   Culture     Final   Value:        BLOOD CULTURE  RECEIVED NO GROWTH TO DATE CULTURE WILL BE HELD FOR 5 DAYS BEFORE ISSUING A FINAL NEGATIVE REPORT     Performed at [facility]   Report Status PENDING   Incomplete  CULTURE, BLOOD (ROUTINE X 2)     Status: None   Collection Time    08/26/13 10:15 AM      Result Value Range Status   Specimen Description BLOOD RIGHT ARM   Final   Special Requests BOTTLES DRAWN AEROBIC AND ANAEROBIC 4CC   Final   Culture  Setup Time     Final   Value: 08/26/2013 13:28     Performed at [facility]   Culture     Final   Value:        BLOOD CULTURE RECEIVED NO GROWTH TO DATE CULTURE WILL BE HELD FOR 5  DAYS BEFORE ISSUING A FINAL NEGATIVE REPORT     Performed at [facility]   Report Status PENDING   Incomplete   Assessment: His fungemia appears to be responding to PICC line removal and antifungal therapy. Repeat blood cultures from 3 days ago remain negative. It should be okay to go ahead and replace PICC. He will need antifungal therapy for 2 weeks after negative blood culture, therefore through September 23.  Plan: 1. Continue micafungin 2. Okay to go ahead and replace PICC 3. Please call Dr. Judyann Munson [phone]) for any infectious disease questions this weekend  Cliffton Asters, MD [facility] for Infectious Disease [facility] [phone] pager   [phone] cell 08/28/2013, 2:09 PM

## 2013-08-28 NOTE — Consult Note (Signed)
Asked to see pt Re: Pleurex drainage system management.   Pt had drain placed at [facility], but follow up is unclear. Sounds like there was some mis-communication of management as he has had one vacu-drain placed only, leaving this essentially ineffective.  His CXR as of 9/11 showed edema w/ small effusions and fluid in fissure but drain not likely affecting this.  Rec/plan >drain pleurex now, record output, then clamp.  >drain again on 9/14. If <200 ML would changed to every third day drainage.  >If less than 200 ml on this drainage can drain weekly and PRN dyspnea.  >if has minimal out-put over 4 weeks of weekly drainage could consider d/c. He needs to go back to the MD who placed this at time of discharge for further f/u.   [name] ACNP-BC [facility] Pulmonary/Critical Care Pager # [phone] OR # [phone] if no answer  Coralyn Helling, MD [facility] Pulmonary/Critical Care 08/28/2013, 1:26 PM Pager:  [phone] After 3pm call: [phone]

## 2013-08-28 NOTE — Progress Notes (Signed)
Pt lying in bed with his niece at his side.  He reports that he is still feeling better.  His left hand is sore.  Pt is expecting a PICC line to be placed.  A goals of care meeting with PMT is scheduled for Sunday at 2 PM.  Writer spoke to pt about his out of facility DNR.  Pt confirmed that he is not ready to be a DNR.  Introduced the idea of a MOST form to guide his care.  Pt very interested.   Reviewed chart.  Confirmed GOC meeting to be held Sunday 08/30/13 at 2 PM.  No discharge date at this time. This is a related but NOT covered hospice admission secondary to private insurance.   Please contact [facility] at [phone] with any Pt movements.  [name] RN, [facility] RN, [facility] [phone]

## 2013-08-28 NOTE — Progress Notes (Signed)
Pt has pleurex cath with bulb attached and labeled "do not remove". Per order from Anders Simmonds NP, I attempted to do site care and apply new drain bulb and then cap off pleurex drain. Pt's wife was in room and refused to have this done stating that "we were told to not remove this , and every time it is plugged off, he gets into trouble breathing."  No other attempt made after explaining to wife how we usually manage this drain and she continued to refuse. Fionn Stracke, Georga Hacking, RN

## 2013-08-28 NOTE — Progress Notes (Signed)
Contacted by [facility]) nurse liaison with request for goals of care discussion with patient/family; writer spoke with Dr Janee Morn who is in agreement- PMT will follow up today with patient and family to schedule a meeting time.     Valente David, RN 08/28/2013, 9:07 AM Palliative Medicine Team RN Liaison [phone]

## 2013-08-29 ENCOUNTER — Inpatient Hospital Stay (HOSPITAL_COMMUNITY): Payer: 59

## 2013-08-29 LAB — CBC
HCT: 30.4 % — ABNORMAL LOW (ref 39.0–52.0)
MCH: 29.3 pg (ref 26.0–34.0)
MCHC: 31.6 g/dL (ref 30.0–36.0)
MCV: 92.7 fL (ref 78.0–100.0)
RDW: 16.2 % — ABNORMAL HIGH (ref 11.5–15.5)

## 2013-08-29 LAB — BASIC METABOLIC PANEL
BUN: 30 mg/dL — ABNORMAL HIGH (ref 6–23)
Calcium: 9.7 mg/dL (ref 8.4–10.5)
Creatinine, Ser: 1.14 mg/dL (ref 0.50–1.35)
GFR calc Af Amer: 81 mL/min — ABNORMAL LOW (ref >90)
GFR calc non Af Amer: 70 mL/min — ABNORMAL LOW (ref >90)
Potassium: 3.6 mEq/L (ref 3.5–5.1)

## 2013-08-29 MED ORDER — DEXTROSE 10 % IV SOLN
INTRAVENOUS | Status: DC
  Administered 2013-08-30: via INTRAVENOUS
  Filled 2013-08-29: qty 1000

## 2013-08-29 MED ORDER — SODIUM CHLORIDE 0.9 % IJ SOLN
10.0000 mL | Freq: Two times a day (BID) | INTRAMUSCULAR | Status: DC
  Administered 2013-08-29 (×2): 20 mL
  Administered 2013-08-31: 40 mL

## 2013-08-29 MED ORDER — TRACE MINERALS CR-CU-F-FE-I-MN-MO-SE-ZN IV SOLN
INTRAVENOUS | Status: AC
  Administered 2013-08-29: 20:00:00 via INTRAVENOUS
  Filled 2013-08-29: qty 1000

## 2013-08-29 MED ORDER — FAT EMULSION 20 % IV EMUL
240.0000 mL | INTRAVENOUS | Status: AC
  Administered 2013-08-29: 240 mL via INTRAVENOUS
  Filled 2013-08-29: qty 250

## 2013-08-29 MED ORDER — SODIUM CHLORIDE 0.9 % IJ SOLN
10.0000 mL | INTRAMUSCULAR | Status: DC | PRN
  Administered 2013-09-02: 10 mL

## 2013-08-29 MED ORDER — INSULIN ASPART 100 UNIT/ML ~~LOC~~ SOLN
0.0000 [IU] | SUBCUTANEOUS | Status: DC
  Administered 2013-08-30 – 2013-08-31 (×6): 1 [IU] via SUBCUTANEOUS
  Administered 2013-08-31 – 2013-09-01 (×2): 2 [IU] via SUBCUTANEOUS
  Administered 2013-09-01 (×2): 1 [IU] via SUBCUTANEOUS
  Administered 2013-09-01: 2 [IU] via SUBCUTANEOUS
  Administered 2013-09-01 – 2013-09-02 (×3): 1 [IU] via SUBCUTANEOUS

## 2013-08-29 NOTE — Progress Notes (Signed)
PARENTERAL NUTRITION CONSULT NOTE - INITIAL  Pharmacy Consult for TNA Indication: Hx Bowel Obstruction, Protein Calorie Malnutrition  Allergies  Allergen Reactions  . Percocet [Oxycodone-Acetaminophen] Nausea And Vomiting    hallucination    Patient Measurements: Height: 6\' 4"  (193 cm) Weight: 193 lb 2 oz (87.6 kg) IBW/kg (Calculated) : 86.8 Usual Weight:   Vital Signs: Temp: 97.3 F (36.3 C) (09/13 1340) Temp src: Oral (09/13 1340) BP: 114/76 mmHg (09/13 1340) Pulse Rate: 76 (09/13 1300) Intake/Output from previous day: 09/12 0701 - 09/13 0700 In: 1507.3 [I.V.:907.3; IV Piggyback:600] Out: 4750 [Urine:850; Drains:3850; Stool:50] Intake/Output from this shift: Total I/O In: 754 [P.O.:360; I.V.:294; IV Piggyback:100] Out: 1275 [Urine:325; Drains:950]  Labs:  Recent Labs  08/27/13 0705 08/28/13 0325 08/29/13 0325  WBC 6.5 6.8 6.2  HGB 8.6* 9.4* 9.6*  HCT 27.0* 29.8* 30.4*  PLT 158 180 167     Recent Labs  08/26/13 2200 08/27/13 1130 08/27/13 2315 08/28/13 1115 08/29/13 0325  NA 145 148* 146* 143 141  K 3.1* 3.2* 3.3* 3.1* 3.6  CL 107 111 108 106 105  CO2 28 27 29 29 29   GLUCOSE 83 109* 110* 133* 107*  BUN 30* 33* 31* 30* 30*  CREATININE 1.33 1.37* 1.30 1.13 1.14  CALCIUM 9.8 9.5 9.6 9.5 9.7  MG 1.6 1.5 2.3  --   --   PHOS 4.6 4.7* 4.1  --   --    Estimated Creatinine Clearance: 88.8 ml/min (by C-G formula based on Cr of 1.14).    Recent Labs  08/27/13 0754 08/28/13 0806 08/29/13 0828  GLUCAP 104* 131* 127*    Medical History: Past Medical History  Diagnosis Date  . Hypertension   . GERD (gastroesophageal reflux disease)   . Small bowel obstruction 2013  . 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 Acute cholecystitis s/p perc draianage [id number] 12/20/2012  . Anemia of chronic disease 12/19/2012  . 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 MRSA bacteremia  01/04/2013  . SBO (small bowel obstruction) 11/07/2011  . Septic shock due to Staphylococcus aureus 12/24/2012  . Severe protein-calorie malnutrition 01/17/2013  . Hospice care patient     Medications:  Scheduled:  . antiseptic oral rinse  15 mL Mouth Rinse q12n4p  . chlorhexidine  15 mL Mouth Rinse BID  . fentaNYL  100 mcg Transdermal Q72H  . furosemide  20 mg Intravenous Daily  . metoprolol  2.5 mg Intravenous Q12H  . micafungin (MYCAMINE) IV  100 mg Intravenous Daily  . pantoprazole  40 mg Intravenous q morning - 10a  . sodium chloride  10-40 mL Intracatheter Q12H  . sodium chloride  3 mL Intravenous Q12H   Infusions:  . dextrose 500 mL (08/29/13 1059)  . HYDROmorphone 1 mg/hr (08/29/13 0203)    Insulin Requirements in the past 24 hours:  (None ordered)  Nutritional Goals:  Home TNA provides 2828 kcal/day, 132 g Protein/day, and 2400 ml/day.  Clinimix E 5/20 at 13ml/hr + IV lipids at 66ml/hr will provide 2800 kcal/day and 132g protein/day.  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 Home TNA provides 2828 kcal/day, 132 g Protein/day, and 2400 ml/day. TNA was running at 115ml/hr at home, but had been discontinued due  to fungemia (candida albicans). PICC line was removed. 2-D ECHO (-) for vegitation per MD note. Repeat blood cultures from 3 days ago remain negative, therefore ok to replace PICC per ID. Plan now is to resume TNA per Pharmacy. Electrolytes currently wnl. CBGs <150.  Will need to be careful with fluid from TNA due to acute CHF exacerbation on admission. Currently is essentially neutral fluid balance, and down 17 L since admission.  Plan:  1) At 8pm tonight, start Clinimix E 5/20 at 75ml/hr and IV lipids at 98ml/hr 2) Decrease D10W to 93ml/hr (KVO) 3) MVI, TE daily 4)  Will check CBGs q4 hours initially. 5) TNA labs tomorrow, then every Mon and Thurs  Darrol Angel, PharmD 08/29/2013,4:52 PM

## 2013-08-29 NOTE — Progress Notes (Signed)
[facility] PROGRESS NOTE  [name] WGN:[id number] DOB: [birth year] DOA: 08/23/2013 PCP: [name], MD  Assessment/Plan: #1 acute hypoxic respiratory distress Likely secondary to acute CHF exacerbation/pulmonary edema. Cannot exclude metastatic disease. Pro BNP was elevated at 5661 on 9/8 /2014. Pro calcitonin level is slowly trending down. Cardiac enzymes negative x3. 2-D echo with EF of 30-35% with systolic function moderately to severely reduced. No vegetations noted. I/0. equal -16.5L since admission; weight today is 87.6kg from 89.5kg from 95.4 kg from 105.1 kg(08/25/13) from 105.6kg (08/23/13). Lasix gtt has been changed to lasix 20mg  IV Daily. Continue low dose beta blocker if BP allows. Follow.  #2 severe sepsis Likely secondary to fungemia secondary to chronic PICC line. Doubt if PNA as patient improved rapidly with diureses. PICC removed 08/24/13. Urine Legionella antigen negative. Urine pneumococcus antigen negative. Blood cultures positive for fungemia growing Candida albicans. Urine cultures negative. Pro calcitonin level slowly trending down at 3.28 (08/26/13)  from 5.95. Repeat blood cultures pending with no growth to date. Continue  IV micafungin. 2-D echo with no vegetations noted. IV vancomycin and flagyl and cefepime discontinued. ID ff and appreciate input and rxcs.  #3 acute systolic CHF exacerbation/ nonischemic cardiomyopathy Questionable etiology.  CM maybe secondary to sepsis. Pro BNP was elevated at 5661 on 08/24/2013. 2-D echo with a depressed EF of 30-35% with moderately to severely reduced systolic function. No vegetations noted. Cardiac enzymes negative x3. I/O = -16,509.8 since admission. Current weight is 87.6 kg from 89.5kg from 95.4 kg from 105.6 kg on admission 08/23/2013. Patient was placed on a Lasix drip which will be discontinued and changed to IV lasix daily if BP withholds. Continue low-dose beta blocker. And if patient remained hemodynamically stable  may consider adding low-dose ACE inhibitor. Patient with a complex medical history and currently not a candidate for invasive procedures at this point in time. Will treat medically. Follow.  #4 metastatic adenocarcinoma/peritoneal carcinomatosis status post extensive debulking procedures no abdominal surgeries, multiple drains and fistulas on chronic TPN via PICC line, chronic PEG for gastric decompression/history of small bowel obstruction status post jejunal stent/ileostomy/cholecystitis status post cholecystostomy/chronic indwelling cath Patient is currently n.p.o. TPN has been held and discontinued secondary to fungemia. PICC line removed. Patient has not had chemotherapy and greater than 9 months. Patient on home hospice however is full code. PICC line to be reinserted. Will resume TPN after PICC line placed.  #5 anemia of chronic disease Stable. Follow H&H. Transfusion threshold hemoglobin less than 7.   #6 fungemia Likely secondary to chronic PICC line. PICC line has been removed on 08/24/2013. 2-D echo with a depressed EF of 30-35% with no vegetations noted. Continue empiric IV micafungin. Will need minimum of 2 weeks per ID through Sept 23 2014. ID ff and appreciate input and rxcs.  #7 severe protein calorie malnutrition Secondary to problem #4. TPN on hold secondary to severe sepsis. Will monitor for now. Repeat blood cultures are negative to date.  Per ID ok to resume PICC line. Awaiting PICC line to be placed. Consult nuitrition to start TPN once line placed.   #8 oral thrush On micafungin.  #9 hypokalemia Secondary to diureses. Repleted. BMET in AM.  #10 prophylaxis PPI for GI prophylaxis. SCDs for DVT prophylaxis.   Code Status: Full Family Communication: Updated patient no family present. Disposition Plan: Transfer to telemetry   Consultants:  PCCM: Dr. Tyson Alias 08/24/2013  Infectious disease Dr. Orvan Falconer 08/26/13  Procedures:  Chest x-ray 08/23/2013,  08/27/13  2-D echo  08/24/2013  CT CHEST  08/24/2013  PICC line removed 08/24/2013  Antibiotics: Vancomycin 08/23/2013 -->08/26/13  Cefepime 08/23/2013 --> 08/27/13 Flagyl 08/24/2013 --> 08/26/13 Micafungin 08/24/2013 -->  Fluconazole 08/23/2013 --> 08/24/2013    HPI/Subjective: Patient with no complaints. Feels breathing close to baseline.  Objective: Filed Vitals:   08/29/13 0700  BP:   Pulse: 74  Temp:   Resp: 20    Intake/Output Summary (Last 24 hours) at 08/29/13 0812 Last data filed at 08/29/13 0600  Gross per 24 hour  Intake 1423.33 ml  Output   4750 ml  Net -3326.67 ml   Filed Weights   08/27/13 0600 08/28/13 0400 08/29/13 0500  Weight: 89.5 kg (197 lb 5 oz) 89.5 kg (197 lb 5 oz) 87.6 kg (193 lb 2 oz)    Exam:   General:  Laying in bed. Talking in full sentences.  Cardiovascular: RRR  Respiratory: CTAB  Abdomen: Soft/NT/ND/+BS/ Multiple drains on abdomen noted. Ileostomy, fistula and PEG noted. Pleural drain in place.  Extremities: No clubbing cyanosis or edema.  Neurological: Intact.  Data Reviewed: Basic Metabolic Panel:  Recent Labs Lab 08/26/13 0015 08/26/13 1130 08/26/13 2200 08/27/13 1130 08/27/13 2315 08/28/13 1115 08/29/13 0325  NA 146* 146* 145 148* 146* 143 141  K 3.4* 3.3* 3.1* 3.2* 3.3* 3.1* 3.6  CL 115* 112 107 111 108 106 105  CO2 23 24 28 27 29 29 29   GLUCOSE 62* 79 83 109* 110* 133* 107*  BUN 35* 32* 30* 33* 31* 30* 30*  CREATININE 1.26 1.27 1.33 1.37* 1.30 1.13 1.14  CALCIUM 9.4 9.5 9.8 9.5 9.6 9.5 9.7  MG 2.0 1.7 1.6 1.5 2.3  --   --   PHOS 5.1* 5.2* 4.6 4.7* 4.1  --   --    Liver Function Tests:  Recent Labs Lab 08/23/13 0930 08/24/13 0625 08/25/13 0334  AST 37 34 56*  ALT 49 43 56*  ALKPHOS 208* 165* 168*  BILITOT 2.2* 2.2* 2.7*  PROT 7.1 6.7 6.8  ALBUMIN 2.0* 1.9* 1.9*    Recent Labs Lab 08/24/13 1140  LIPASE 38  AMYLASE 39   No results found for this basename: AMMONIA,  in the last 168  hours CBC:  Recent Labs Lab 08/23/13 0930 08/24/13 0625 08/25/13 0334 08/27/13 0705 08/28/13 0325 08/29/13 0325  WBC 10.5 10.0 8.0 6.5 6.8 6.2  NEUTROABS 8.3*  --   --  5.1  --   --   HGB 8.1* 7.2* 7.3* 8.6* 9.4* 9.6*  HCT 25.7* 22.5* 23.5* 27.0* 29.8* 30.4*  MCV 93.1 92.6 92.9 92.2 91.7 92.7  PLT 180 162 166 158 180 167   Cardiac Enzymes:  Recent Labs Lab 08/26/13 1010 08/26/13 1523 08/26/13 2200  TROPONINI <0.30 <0.30 <0.30   BNP (last 3 results)  Recent Labs  12/22/12 1700 08/23/13 0930 08/24/13 1148  PROBNP 33.8 9174.0* 5661.0*   CBG:  Recent Labs Lab 08/25/13 0826 08/26/13 0752 08/26/13 0843 08/27/13 0754 08/28/13 0806  GLUCAP 107* 68* 72 104* 131*    Recent Results (from the past 240 hour(s))  CULTURE, BLOOD (ROUTINE X 2)     Status: None   Collection Time    08/23/13  9:30 AM      Result Value Range Status   Specimen Description BLOOD PICC LEFT ARM  5 ML IN [facility] BOTTLE   Final   Special Requests NONE   Final   Culture  Setup Time     Final   Value: 08/23/2013 16:16  Performed at [facility]     Final   Value: CANDIDA ALBICANS     Note: Gram Stain Report Called to,Read Back By and Verified With: TIA JOHNSON 08/24/13 0940 BY SMITHERSJ     Performed at [facility]   Report Status PENDING   Incomplete  MRSA PCR SCREENING     Status: Abnormal   Collection Time    08/23/13 11:52 AM      Result Value Range Status   MRSA by PCR POSITIVE (*) NEGATIVE Final   Comment:            The GeneXpert MRSA Assay (FDA     approved for NASAL specimens     only), is one component of a     comprehensive MRSA colonization     surveillance program. It is not     intended to diagnose MRSA     infection nor to guide or     monitor treatment for     MRSA infections.     RESULT CALLED TO, READ BACK BY AND VERIFIED WITH:     ASHLEY,A AT 1358 ON [id number] BY POTEAT,S  URINE CULTURE     Status: None   Collection Time    08/23/13  3:27  PM      Result Value Range Status   Specimen Description URINE, RANDOM   Final   Special Requests NONE   Final   Culture  Setup Time     Final   Value: 08/23/2013 22:07     Performed at [facility] Count     Final   Value: NO GROWTH     Performed at [facility]   Culture     Final   Value: NO GROWTH     Performed at [facility]   Report Status 08/24/2013 FINAL   Final  CULTURE, BLOOD (ROUTINE X 2)     Status: None   Collection Time    08/25/13 12:35 PM      Result Value Range Status   Specimen Description BLOOD RIGHT HAND   Final   Special Requests BOTTLES DRAWN AEROBIC ONLY 4CC   Final   Culture  Setup Time     Final   Value: 08/25/2013 15:06     Performed at [facility]   Culture     Final   Value:        BLOOD CULTURE RECEIVED NO GROWTH TO DATE CULTURE WILL BE HELD FOR 5 DAYS BEFORE ISSUING A FINAL NEGATIVE REPORT     Performed at [facility]   Report Status PENDING   Incomplete  CULTURE, BLOOD (ROUTINE X 2)     Status: None   Collection Time    08/25/13 12:35 PM      Result Value Range Status   Specimen Description BLOOD LEFT ARM   Final   Special Requests     Final   Value: BOTTLES DRAWN AEROBIC AND ANAEROBIC 10CC AER 5CC ANA   Culture  Setup Time     Final   Value: 08/25/2013 15:06     Performed at [facility]   Culture     Final   Value:        BLOOD CULTURE RECEIVED NO GROWTH TO DATE CULTURE WILL BE HELD FOR 5 DAYS BEFORE ISSUING A FINAL NEGATIVE REPORT     Performed at [facility]   Report Status PENDING  Incomplete  CULTURE, BLOOD (ROUTINE X 2)     Status: None   Collection Time    08/26/13 10:10 AM      Result Value Range Status   Specimen Description BLOOD LEFT ARM   Final   Special Requests     Final   Value: BOTTLES DRAWN AEROBIC AND ANAEROBIC 5CC ANA 10CC AER   Culture  Setup Time     Final   Value: 08/26/2013 13:28     Performed at [facility]   Culture     Final    Value:        BLOOD CULTURE RECEIVED NO GROWTH TO DATE CULTURE WILL BE HELD FOR 5 DAYS BEFORE ISSUING A FINAL NEGATIVE REPORT     Performed at [facility]   Report Status PENDING   Incomplete  CULTURE, BLOOD (ROUTINE X 2)     Status: None   Collection Time    08/26/13 10:15 AM      Result Value Range Status   Specimen Description BLOOD RIGHT ARM   Final   Special Requests BOTTLES DRAWN AEROBIC AND ANAEROBIC 4CC   Final   Culture  Setup Time     Final   Value: 08/26/2013 13:28     Performed at [facility]   Culture     Final   Value:        BLOOD CULTURE RECEIVED NO GROWTH TO DATE CULTURE WILL BE HELD FOR 5 DAYS BEFORE ISSUING A FINAL NEGATIVE REPORT     Performed at [facility]   Report Status PENDING   Incomplete     Studies: No results found.  Scheduled Meds: . antiseptic oral rinse  15 mL Mouth Rinse q12n4p  . chlorhexidine  15 mL Mouth Rinse BID  . fentaNYL  100 mcg Transdermal Q72H  . furosemide  20 mg Intravenous Daily  . metoprolol  2.5 mg Intravenous Q12H  . micafungin (MYCAMINE) IV  100 mg Intravenous Daily  . pantoprazole  40 mg Intravenous q morning - 10a  . sodium chloride  3 mL Intravenous Q12H   Continuous Infusions: . dextrose 40 mL/hr at 08/28/13 2132  . HYDROmorphone 1 mg/hr (08/29/13 0203)    Principal Problem:   Acute respiratory failure with hypoxia Active Problems:   Abdominal malignant neoplasm   Metastatic adenocarcinoma   Peritoneal carcinomatosis   Anemia of chronic disease   Colo-enteric fistula   Severe protein-calorie malnutrition   Acute renal failure   Sepsis   Fungemia   Hypokalemia   Hypernatremia   Recurrent bacteremia   Pleural effusion    Time spent: > 40 mins    [facility]  [facility] Pager [phone]. If 7PM-7AM, please contact night-coverage at [email or web address], password [facility] 08/29/2013, 8:12 AM  LOS: 6 days

## 2013-08-29 NOTE — Progress Notes (Signed)
Peripherally Inserted Central Catheter/Midline Placement  The IV Nurse has discussed with the patient and/or persons authorized to consent for the patient, the purpose of this procedure and the potential benefits and risks involved with this procedure.  The benefits include less needle sticks, lab draws from the catheter and patient may be discharged home with the catheter.  Risks include, but not limited to, infection, bleeding, blood clot (thrombus formation), and puncture of an artery; nerve damage and irregular heat beat.  Alternatives to this procedure were also discussed.  PICC/Midline Placement Documentation  PICC / Midline Double Lumen 08/29/13 PICC Right Brachial (Active)  Indication for Insertion or Continuance of Line Administration of hyperosmolar/irritating solutions (i.e. TPN, Vancomycin, etc.) 08/29/2013  9:30 AM  Length mark (cm) 1 cm 08/29/2013  9:30 AM  Site Assessment Clean;Dry;Intact 08/29/2013  9:30 AM  Lumen #1 Status Flushed;Saline locked;Blood return noted 08/29/2013  9:30 AM  Lumen #2 Status Flushed;Saline locked;Blood return noted 08/29/2013  9:30 AM  Dressing Type Transparent 08/29/2013  9:30 AM  Dressing Status Clean;Dry;Intact;Antimicrobial disc in place 08/29/2013  9:30 AM  Dressing Intervention New dressing 08/29/2013  9:30 AM  Dressing Change Due 09/05/13 08/29/2013  9:30 AM       Ethelda Chick 08/29/2013, 9:39 AM

## 2013-08-29 NOTE — Progress Notes (Signed)
Full Code. Related admission but not covered due to secondary private insurance.  Patient scheduled to transfer to 1514 today.  PICC line reinserted yesterday.  Patient currently receiving care from aide.  GOC meeting scheduled for tomorrow at 2 pm.  Please call [facility] [phone] with any questions or concerns.  Willette Pa, RN [facility]

## 2013-08-29 NOTE — Progress Notes (Signed)
[name] 1227 TRANSFERRED TO 1514 - REPORT CALLED TO CHRISTINA RN. NO CHANGE IN PATIENT'S CONDITION.

## 2013-08-30 DIAGNOSIS — Z515 Encounter for palliative care: Secondary | ICD-10-CM

## 2013-08-30 LAB — COMPREHENSIVE METABOLIC PANEL
Alkaline Phosphatase: 268 U/L — ABNORMAL HIGH (ref 39–117)
BUN: 31 mg/dL — ABNORMAL HIGH (ref 6–23)
CO2: 28 mEq/L (ref 19–32)
Chloride: 101 mEq/L (ref 96–112)
GFR calc Af Amer: 77 mL/min — ABNORMAL LOW (ref >90)
GFR calc non Af Amer: 67 mL/min — ABNORMAL LOW (ref >90)
Glucose, Bld: 128 mg/dL — ABNORMAL HIGH (ref 70–99)
Potassium: 3.3 mEq/L — ABNORMAL LOW (ref 3.5–5.1)
Total Bilirubin: 2.7 mg/dL — ABNORMAL HIGH (ref 0.3–1.2)
Total Protein: 8.4 g/dL — ABNORMAL HIGH (ref 6.0–8.3)

## 2013-08-30 LAB — CBC
HCT: 29.5 % — ABNORMAL LOW (ref 39.0–52.0)
Hemoglobin: 9.1 g/dL — ABNORMAL LOW (ref 13.0–17.0)
MCV: 93.1 fL (ref 78.0–100.0)
RDW: 16.2 % — ABNORMAL HIGH (ref 11.5–15.5)
WBC: 5.7 10*3/uL (ref 4.0–10.5)

## 2013-08-30 LAB — DIFFERENTIAL
Eosinophils Absolute: 0.2 10*3/uL (ref 0.0–0.7)
Eosinophils Relative: 4 % (ref 0–5)
Lymphs Abs: 1.2 10*3/uL (ref 0.7–4.0)
Monocytes Relative: 10 % (ref 3–12)

## 2013-08-30 LAB — GLUCOSE, CAPILLARY
Glucose-Capillary: 123 mg/dL — ABNORMAL HIGH (ref 70–99)
Glucose-Capillary: 128 mg/dL — ABNORMAL HIGH (ref 70–99)
Glucose-Capillary: 111 mg/dL — ABNORMAL HIGH (ref 70–99)
Glucose-Capillary: 117 mg/dL — ABNORMAL HIGH (ref 70–99)
Glucose-Capillary: 123 mg/dL — ABNORMAL HIGH (ref 70–99)

## 2013-08-30 LAB — PREALBUMIN: Prealbumin: 12.3 mg/dL — ABNORMAL LOW (ref 17.0–34.0)

## 2013-08-30 LAB — TRIGLYCERIDES: Triglycerides: 278 mg/dL — ABNORMAL HIGH (ref <150)

## 2013-08-30 MED ORDER — POTASSIUM CHLORIDE 10 MEQ/100ML IV SOLN
10.0000 meq | INTRAVENOUS | Status: AC
  Administered 2013-08-30 (×4): 10 meq via INTRAVENOUS
  Filled 2013-08-30 (×4): qty 100

## 2013-08-30 MED ORDER — SODIUM CHLORIDE 0.9 % IV SOLN
INTRAVENOUS | Status: DC
  Administered 2013-08-30: 20 mL/h via INTRAVENOUS
  Administered 2013-09-01: 22:00:00 via INTRAVENOUS

## 2013-08-30 MED ORDER — FAT EMULSION 20 % IV EMUL
240.0000 mL | INTRAVENOUS | Status: AC
  Administered 2013-08-30: 240 mL via INTRAVENOUS
  Filled 2013-08-30: qty 250

## 2013-08-30 MED ORDER — M.V.I. ADULT IV INJ
INJECTION | INTRAVENOUS | Status: AC
  Administered 2013-08-30: 18:00:00 via INTRAVENOUS
  Filled 2013-08-30: qty 2000

## 2013-08-30 NOTE — Progress Notes (Signed)
[facility] PROGRESS NOTE  DMARION PERFECT OZH:[id number] DOB: 04/19/1956 DOA: 08/23/2013 PCP: [name], MD  Assessment/Plan: #1 acute hypoxic respiratory distress Likely secondary to acute CHF exacerbation/pulmonary edema. Cannot exclude metastatic disease. Pro BNP was elevated at 5661 on 9/8 /2014. Pro calcitonin level is slowly trending down. Cardiac enzymes negative x3. 2-D echo with EF of 30-35% with systolic function moderately to severely reduced. No vegetations noted. I/0. equal -17.6L since admission; weight today is 87.6kg from 89.5kg from 95.4 kg from 105.1 kg(08/25/13) from 105.6kg (08/23/13). Lasix gtt has been changed to lasix 20mg  IV Daily. Continue low dose beta blocker if BP allows. Follow.  #2 severe sepsis Likely secondary to fungemia secondary to chronic PICC line. Doubt if PNA as patient improved rapidly with diureses. PICC removed 08/24/13. Urine Legionella antigen negative. Urine pneumococcus antigen negative. Blood cultures positive for fungemia growing Candida albicans. Urine cultures negative. Pro calcitonin level slowly trending down at 3.28 (08/26/13)  from 5.95. Repeat blood cultures pending with no growth to date. Continue  IV micafungin. 2-D echo with no vegetations noted. IV vancomycin and flagyl and cefepime discontinued. ID ff and appreciate input and rxcs.  #3 acute systolic CHF exacerbation/ nonischemic cardiomyopathy Questionable etiology.  CM maybe secondary to sepsis. Pro BNP was elevated at 5661 on 08/24/2013. 2-D echo with a depressed EF of 30-35% with moderately to severely reduced systolic function. No vegetations noted. Cardiac enzymes negative x3. I/O = -[id number] since admission. Current weight is 90.6kg from 87.6 kg from 89.5kg from 95.4 kg from 105.6 kg on admission 08/23/2013. Patient was placed on a Lasix drip which will be discontinued and changed to IV lasix daily if BP withholds. Continue low-dose beta blocker. And if patient remained  hemodynamically stable may consider adding low-dose ACE inhibitor. Patient with a complex medical history and currently not a candidate for invasive procedures at this point in time. Will treat medically. Follow.  #4 metastatic adenocarcinoma/peritoneal carcinomatosis status post extensive debulking procedures no abdominal surgeries, multiple drains and fistulas on chronic TPN via PICC line, chronic PEG for gastric decompression/history of small bowel obstruction status post jejunal stent/ileostomy/cholecystitis status post cholecystostomy/chronic indwelling cath Patient is currently n.p.o. TPN was held secondary to fungemia. PICC line reinserted. Patient has not had chemotherapy and greater than 9 months. Patient on home hospice however is full code. Resume TPN.  Palliative care meeting today for goals of care.  #5 anemia of chronic disease Stable. Follow H&H. Transfusion threshold hemoglobin less than 7.   #6 fungemia Likely secondary to chronic PICC line. PICC line has been removed on 08/24/2013. 2-D echo with a depressed EF of 30-35% with no vegetations noted. Continue empiric IV micafungin. Will need minimum of 2 weeks per ID through Sept 23 2014. ID ff and appreciate input and rxcs.  #7 severe protein calorie malnutrition Secondary to problem #4. TPN was held secondary to severe sepsis. Repeat blood cultures are negative to date.  PICC line placed yesterday. TPN started.   #8 oral thrush On micafungin.  #9 hypokalemia Secondary to diureses. Replete. BMET in AM.  #10 Decubitus ulcer Continue wound care  #11 prophylaxis PPI for GI prophylaxis. SCDs for DVT prophylaxis.   Code Status: Full Family Communication: Updated patient no family present. Disposition Plan: Home when medically stable   Consultants:  PCCM: Dr. Tyson Alias 08/24/2013  Infectious disease Dr. Orvan Falconer 08/26/13  Procedures:  Chest x-ray 08/23/2013, 08/27/13  2-D echo 08/24/2013  CT CHEST   08/24/2013  PICC line removed 08/24/2013  Antibiotics:  Vancomycin 08/23/2013 -->08/26/13  Cefepime 08/23/2013 --> 08/27/13 Flagyl 08/24/2013 --> 08/26/13 Micafungin 08/24/2013 -->  Fluconazole 08/23/2013 --> 08/24/2013    HPI/Subjective: Patient with no complaints. Feels breathing close to baseline.  Objective: Filed Vitals:   08/30/13 0941  BP: 98/78  Pulse:   Temp:   Resp:     Intake/Output Summary (Last 24 hours) at 08/30/13 1217 Last data filed at 08/30/13 0526  Gross per 24 hour  Intake    324 ml  Output   1025 ml  Net   -701 ml   Filed Weights   08/28/13 0400 08/29/13 0500 08/30/13 0524  Weight: 89.5 kg (197 lb 5 oz) 87.6 kg (193 lb 2 oz) 90.6 kg (199 lb 11.8 oz)    Exam:   General:  Laying in bed. Talking in full sentences.  Cardiovascular: RRR  Respiratory: CTAB  Abdomen: Soft/NT/ND/+BS/ Multiple drains on abdomen noted. Ileostomy, fistula and PEG noted. Pleural drain in place.  Extremities: No clubbing cyanosis or edema.  Neurological: Intact.  Data Reviewed: Basic Metabolic Panel:  Recent Labs Lab 08/26/13 1130 08/26/13 2200 08/27/13 1130 08/27/13 2315 08/28/13 1115 08/29/13 0325 08/30/13 0500  NA 146* 145 148* 146* 143 141 140  K 3.3* 3.1* 3.2* 3.3* 3.1* 3.6 3.3*  CL 112 107 111 108 106 105 101  CO2 24 28 27 29 29 29 28   GLUCOSE 79 83 109* 110* 133* 107* 128*  BUN 32* 30* 33* 31* 30* 30* 31*  CREATININE 1.27 1.33 1.37* 1.30 1.13 1.14 1.18  CALCIUM 9.5 9.8 9.5 9.6 9.5 9.7 9.6  MG 1.7 1.6 1.5 2.3  --   --  2.0  PHOS 5.2* 4.6 4.7* 4.1  --   --  4.5   Liver Function Tests:  Recent Labs Lab 08/24/13 0625 08/25/13 0334 08/30/13 0500  AST 34 56* 32  ALT 43 56* 38  ALKPHOS 165* 168* 268*  BILITOT 2.2* 2.7* 2.7*  PROT 6.7 6.8 8.4*  ALBUMIN 1.9* 1.9* 2.4*    Recent Labs Lab 08/24/13 1140  LIPASE 38  AMYLASE 39   No results found for this basename: AMMONIA,  in the last 168 hours CBC:  Recent Labs Lab 08/25/13 0334 08/27/13 0705  08/28/13 0325 08/29/13 0325 08/30/13 0500  WBC 8.0 6.5 6.8 6.2 5.7  NEUTROABS  --  5.1  --   --  3.7  HGB 7.3* 8.6* 9.4* 9.6* 9.1*  HCT 23.5* 27.0* 29.8* 30.4* 29.5*  MCV 92.9 92.2 91.7 92.7 93.1  PLT 166 158 180 167 128*   Cardiac Enzymes:  Recent Labs Lab 08/26/13 1010 08/26/13 1523 08/26/13 2200  TROPONINI <0.30 <0.30 <0.30   BNP (last 3 results)  Recent Labs  12/22/12 1700 08/23/13 0930 08/24/13 1148  PROBNP 33.8 9174.0* 5661.0*   CBG:  Recent Labs Lab 08/29/13 1945 08/29/13 2357 08/30/13 0350 08/30/13 0738 08/30/13 1123  GLUCAP 91 123* 117* 128* 111*    Recent Results (from the past 240 hour(s))  CULTURE, BLOOD (ROUTINE X 2)     Status: None   Collection Time    08/23/13  9:30 AM      Result Value Range Status   Specimen Description BLOOD PICC LEFT ARM  5 ML IN [facility] BOTTLE   Final   Special Requests NONE   Final   Culture  Setup Time     Final   Value: 08/23/2013 16:16     Performed at [facility]   Culture     Final  Value: CANDIDA ALBICANS     Note: Gram Stain Report Called to,Read Back By and Verified With: TIA JOHNSON 08/24/13 0940 BY [email or web address]     Performed at [facility]   Report Status PENDING   Incomplete  MRSA PCR SCREENING     Status: Abnormal   Collection Time    08/23/13 11:52 AM      Result Value Range Status   MRSA by PCR POSITIVE (*) NEGATIVE Final   Comment:            The GeneXpert MRSA Assay (FDA     approved for NASAL specimens     only), is one component of a     comprehensive MRSA colonization     surveillance program. It is not     intended to diagnose MRSA     infection nor to guide or     monitor treatment for     MRSA infections.     RESULT CALLED TO, READ BACK BY AND VERIFIED WITH:     ASHLEY,A AT 1358 ON [id number] BY POTEAT,S  URINE CULTURE     Status: None   Collection Time    08/23/13  3:27 PM      Result Value Range Status   Specimen Description URINE, RANDOM   Final   Special Requests NONE    Final   Culture  Setup Time     Final   Value: 08/23/2013 22:07     Performed at [facility] Count     Final   Value: NO GROWTH     Performed at [facility]   Culture     Final   Value: NO GROWTH     Performed at [facility]   Report Status 08/24/2013 FINAL   Final  CULTURE, BLOOD (ROUTINE X 2)     Status: None   Collection Time    08/25/13 12:35 PM      Result Value Range Status   Specimen Description BLOOD RIGHT HAND   Final   Special Requests BOTTLES DRAWN AEROBIC ONLY 4CC   Final   Culture  Setup Time     Final   Value: 08/25/2013 15:06     Performed at [facility]   Culture     Final   Value:        BLOOD CULTURE RECEIVED NO GROWTH TO DATE CULTURE WILL BE HELD FOR 5 DAYS BEFORE ISSUING A FINAL NEGATIVE REPORT     Performed at [facility]   Report Status PENDING   Incomplete  CULTURE, BLOOD (ROUTINE X 2)     Status: None   Collection Time    08/25/13 12:35 PM      Result Value Range Status   Specimen Description BLOOD LEFT ARM   Final   Special Requests     Final   Value: BOTTLES DRAWN AEROBIC AND ANAEROBIC 10CC AER 5CC ANA   Culture  Setup Time     Final   Value: 08/25/2013 15:06     Performed at [facility]   Culture     Final   Value:        BLOOD CULTURE RECEIVED NO GROWTH TO DATE CULTURE WILL BE HELD FOR 5 DAYS BEFORE ISSUING A FINAL NEGATIVE REPORT     Performed at [facility]   Report Status PENDING   Incomplete  CULTURE, BLOOD (ROUTINE X 2)     Status: None  Collection Time    08/26/13 10:10 AM      Result Value Range Status   Specimen Description BLOOD LEFT ARM   Final   Special Requests     Final   Value: BOTTLES DRAWN AEROBIC AND ANAEROBIC 5CC ANA 10CC AER   Culture  Setup Time     Final   Value: 08/26/2013 13:28     Performed at [facility]   Culture     Final   Value:        BLOOD CULTURE RECEIVED NO GROWTH TO DATE CULTURE WILL BE HELD FOR 5 DAYS BEFORE ISSUING A FINAL  NEGATIVE REPORT     Performed at [facility]   Report Status PENDING   Incomplete  CULTURE, BLOOD (ROUTINE X 2)     Status: None   Collection Time    08/26/13 10:15 AM      Result Value Range Status   Specimen Description BLOOD RIGHT ARM   Final   Special Requests BOTTLES DRAWN AEROBIC AND ANAEROBIC 4CC   Final   Culture  Setup Time     Final   Value: 08/26/2013 13:28     Performed at [facility]   Culture     Final   Value:        BLOOD CULTURE RECEIVED NO GROWTH TO DATE CULTURE WILL BE HELD FOR 5 DAYS BEFORE ISSUING A FINAL NEGATIVE REPORT     Performed at [facility]   Report Status PENDING   Incomplete     Studies: Dg Chest Port 1 View  08/29/2013   CLINICAL DATA:  57 year old male PICC line placement.  EXAM: PORTABLE CHEST - 1 VIEW  COMPARISON:  0943 hr the same day and earlier.  FINDINGS: Portable AP semi upright view at 1033 hrs. Interval adjustment of the right PICC line, tip now at the level of the cavoatrial junction. Otherwise stable chest.  IMPRESSION: PICC line withdrawn, tip now at the level of the cavoatrial junction.   Electronically Signed   By: Augusto Gamble M.D.   On: 08/29/2013 10:55   Dg Chest Port 1 View  08/29/2013   *RADIOLOGY REPORT*  Clinical Data: Evaluate PICC line placement  PORTABLE CHEST - 1 VIEW  Comparison: 08/27/2013  Findings: Right PICC line has been placed with tip over right atrium.  No pneumothorax.  Mild bibasilar atelectasis with mildly improved aeration on the right.  IMPRESSION: PICC line as described.   Original Report Authenticated By: Esperanza Heir, M.D.    Scheduled Meds: . antiseptic oral rinse  15 mL Mouth Rinse q12n4p  . chlorhexidine  15 mL Mouth Rinse BID  . fentaNYL  100 mcg Transdermal Q72H  . furosemide  20 mg Intravenous Daily  . insulin aspart  0-9 Units Subcutaneous Q4H  . metoprolol  2.5 mg Intravenous Q12H  . micafungin (MYCAMINE) IV  100 mg Intravenous Daily  . pantoprazole  40 mg Intravenous q  morning - 10a  . potassium chloride  10 mEq Intravenous Q1 Hr x 4  . sodium chloride  10-40 mL Intracatheter Q12H  . sodium chloride  3 mL Intravenous Q12H   Continuous Infusions: . sodium chloride 20 mL/hr (08/30/13 1119)  . Marland KitchenTPN (CLINIMIX-E) Adult 40 mL/hr at 08/29/13 2002   And  . fat emulsion 240 mL (08/29/13 2001)  . Marland KitchenTPN (CLINIMIX-E) Adult     And  . fat emulsion    . HYDROmorphone 1 mg/hr (08/30/13 2956)    Principal  Problem:   Acute respiratory failure with hypoxia Active Problems:   Abdominal malignant neoplasm   Metastatic adenocarcinoma   Peritoneal carcinomatosis   Anemia of chronic disease   Colo-enteric fistula   Severe protein-calorie malnutrition   Acute renal failure   Sepsis   Fungemia   Hypokalemia   Hypernatremia   Recurrent bacteremia   Pleural effusion    Time spent: > 40 mins    [facility]  [facility] Pager [phone]. If 7PM-7AM, please contact night-coverage at [email or web address], password [facility] 08/30/2013, 12:17 PM  LOS: 7 days

## 2013-08-30 NOTE — Progress Notes (Signed)
[facility] SW visit. Patient was sitting up in bed, awake and alert. Patient stated, " I feel good". He is celebrating his birthday today. His wife was present. Patient complain that he is being told different things by different people about his prognosis that he feels may not be true. Patient insists that he feels well and is looking forward to going home. SW encouraged him to express these concerns during GOC meeting today to get some clarity. His wife was leaving, but planned to return at 2pm for [facility] meeting. No plans for discharge at this time. Emotional support and supportive counseling provided. Primary LCSW will be updated for follow-up.   [address] [place], [place] [phone]

## 2013-08-30 NOTE — Progress Notes (Signed)
PARENTERAL NUTRITION CONSULT NOTE - follow-up  Pharmacy Consult for TNA Indication: Hx Bowel Obstruction, Protein Calorie Malnutrition  Allergies  Allergen Reactions  . Percocet [Oxycodone-Acetaminophen] Nausea And Vomiting    hallucination    Patient Measurements: Height: 6\' 4"  (193 cm) Weight: 199 lb 11.8 oz (90.6 kg) IBW/kg (Calculated) : 86.8 Usual Weight:   Vital Signs: Temp: 97.7 F (36.5 C) (09/14 0524) Temp src: Oral (09/14 0524) BP: 101/67 mmHg (09/14 0524) Pulse Rate: 77 (09/13 2135) Intake/Output from previous day: 09/13 0701 - 09/14 0700 In: 994 [P.O.:600; I.V.:294; IV Piggyback:100] Out: 2175 [Urine:775; Drains:1350; Stool:50] Intake/Output from this shift:    Labs:  Recent Labs  08/28/13 0325 08/29/13 0325 08/30/13 0500  WBC 6.8 6.2 5.7  HGB 9.4* 9.6* 9.1*  HCT 29.8* 30.4* 29.5*  PLT 180 167 128*     Recent Labs  08/27/13 1130 08/27/13 2315 08/28/13 1115 08/29/13 0325 08/30/13 0500  NA 148* 146* 143 141 140  K 3.2* 3.3* 3.1* 3.6 3.3*  CL 111 108 106 105 101  CO2 27 29 29 29 28   GLUCOSE 109* 110* 133* 107* 128*  BUN 33* 31* 30* 30* 31*  CREATININE 1.37* 1.30 1.13 1.14 1.18  CALCIUM 9.5 9.6 9.5 9.7 9.6  MG 1.5 2.3  --   --  2.0  PHOS 4.7* 4.1  --   --  4.5  PROT  --   --   --   --  8.4*  ALBUMIN  --   --   --   --  2.4*  AST  --   --   --   --  32  ALT  --   --   --   --  38  ALKPHOS  --   --   --   --  268*  BILITOT  --   --   --   --  2.7*   Estimated Creatinine Clearance: 84.8 ml/min (by C-G formula based on Cr of 1.18).    Recent Labs  08/29/13 2357 08/30/13 0350 08/30/13 0738  GLUCAP 123* 117* 128*    Medical History: Past Medical History  Diagnosis Date  . Hypertension   . GERD (gastroesophageal reflux disease)   . Small bowel obstruction 2013  . 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 Acute cholecystitis s/p perc draianage [id number] 12/20/2012   . Anemia of chronic disease 12/19/2012  . 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 MRSA bacteremia 01/04/2013  . SBO (small bowel obstruction) 11/07/2011  . Septic shock due to Staphylococcus aureus 12/24/2012  . Severe protein-calorie malnutrition 01/17/2013  . Hospice care patient     Medications:  Scheduled:  . antiseptic oral rinse  15 mL Mouth Rinse q12n4p  . chlorhexidine  15 mL Mouth Rinse BID  . fentaNYL  100 mcg Transdermal Q72H  . furosemide  20 mg Intravenous Daily  . insulin aspart  0-9 Units Subcutaneous Q4H  . metoprolol  2.5 mg Intravenous Q12H  . micafungin (MYCAMINE) IV  100 mg Intravenous Daily  . pantoprazole  40 mg Intravenous q morning - 10a  . potassium chloride  10 mEq Intravenous Q1 Hr x 4  . sodium chloride  10-40 mL Intracatheter Q12H  . sodium chloride  3 mL Intravenous Q12H   Infusions:  . dextrose 10 mL/hr at 08/30/13 0016  . Marland KitchenTPN (CLINIMIX-E) Adult 40 mL/hr at 08/29/13 2002  And  . fat emulsion 240 mL (08/29/13 2001)  . HYDROmorphone 1 mg/hr (08/29/13 0203)    Insulin Requirements in the past 24 hours:  2 units Novolog SSI since 18:00 9/13  Nutritional Goals:  Home TNA provided 2828 kcal/day, 132 g Protein/day, and 2400 ml/day.  Clinimix E 5/20 at 169ml/hr + IV lipids at 45ml/hr will provide 2800 kcal/day and 132g protein/day.  Current Nutrition:  NPO  IVF: D10 at [facility]  TPN Access: PICC re-placed 9/13   Glucose - BGs remain within goal  Electrolytes - K = 3.3 (orders for KCl x 4 runs), Phos = 4.5 (ULN), Corr Ca slightly elevated = 10.88 for Phos x Ca product = 49 (goal <55).  Renal - SCr WNL, I/O = -1131ml/24h (? Accuracy as TPN fluid not listed), - from drains. Wt 87.6kg to 90.6kg  LFTs - Alk phos elevated, Tbili elevated = 2.7  TGs - pending  Prealbumin - pending   Assessment: 57 yo M on chronic TNA at home due to hx of bowel obstruction, EC fistula, abdominal carcinomatosis s/p extensive debulking procedures,  multiple drains, and protein calorie malnutrition. Home TNA provides 2828 kcal/day, 132 g Protein/day, and 2400 ml/day. TNA was running at 162ml/hr at home, but had been discontinued due to fungemia (candida albicans). PICC line was removed. 2-D ECHO (-) for vegetation per MD note. Repeat blood cultures from 3 days ago remain negative, therefore ok to replace PICC per ID. TNA resumed per Pharmacy consult on 9/13.  9/14: D#2 TPN - Will need to be careful with fluid from TNA due to acute CHF exacerbation on admission. Currently is essentially neutral fluid balance, and down 17 L since admission.  Plan:  1) At 8pm tonight, increase Clinimix E 5/20 to 54ml/hr and IV lipids at 28ml/hr  Watch Phos/Calcium and signs of fluid overload - unable to concentrate as using premixed Clinimix product d/t electrolyte shortages. Watch weight. 2) Now that on TPN, D/C D10W and change to NS at [facility] 3) MVI, TE daily 4) Will check CBGs q4 hours initially as titrating to goal 5) TNA labs every Mon and Thurs  Juliette Alcide, PharmD, BCPS.   Pager: [phone] 08/30/2013,9:20 AM

## 2013-08-30 NOTE — Progress Notes (Signed)
Right chest Pleurex drain intact and drained per orders. No drainage noted. Site redressed and remains dry and intact. Pt. tolerated well. No respiratory distress noted. Continue to assess and monitor.

## 2013-08-30 NOTE — Progress Notes (Signed)
NUTRITION FOLLOW UP  Intervention:   TPN per pharmacy RD to follow for nutrition care plan  Nutrition Dx:   Inadequate oral intake related to inability to eat as evidenced by NPO status  Goal:   Pt to meet >/= 90% of their estimated nutrition needs   Monitor:   TPN rate, weight, labs, I/O's  Assessment:   57 y.o. male with complicated medical problems including metastatic adenocarcinoma, peritoneal carcinomatosis status post extensive debulking procedures and abdominal surgeries by Dr. [name] which has left him with multiple drains and fistulas, on chronic TPN via a PICC line, chronic PEG for gastric decompression, history of small bowel obstruction status post jejunal stent, ileostomy, cholecystitis status post cholecystostomy and chronic indwelling drain. He presented to [facility] ED with main concern of progressively worsening shortness of breath, present at rest and with minimal exertion, associated with subjective fevers, chills, malaise, started several days prior to the admission and with no specific alleviating factors.  Per MD note, pt presented with severe sepsis likely secondary to chronic PICC line. TPN was put on hold and has now been resumed as of 9/13.  Pt is has a scheduled GOC meeting today at 2 pm (9/14).  He reported that he is tolerating current TPN well.  Home TNA regimen provided 2828 kcal/day, 132 g protein/day, and 2400 ml/day  Current regimen on Clinimix E 5/20 @ 110 ml/hr + IV lipids at 10 ml/hr will provide 2800 kcal/day and 132 g protein/day. This meets 100% of estimated calorie needs and     Height: Ht Readings from Last 1 Encounters:  08/23/13 6\' 4"  (1.93 m)    Weight Status:   Wt Readings from Last 1 Encounters:  08/30/13 199 lb 11.8 oz (90.6 kg)    Re-estimated needs:  Kcal: 2700-3000 Protein: 130-140 g/day Fluid: 2.7-3.0 L  Skin: Generalized edema; open fistula, wound on buttocks, excoriated right buttocks  Diet Order:     Intake/Output  Summary (Last 24 hours) at 08/30/13 1339 Last data filed at 08/30/13 1240  Gross per 24 hour  Intake    282 ml  Output    900 ml  Net   -618 ml    Last BM: ostomy   Labs:   Recent Labs Lab 08/27/13 1130 08/27/13 2315 08/28/13 1115 08/29/13 0325 08/30/13 0500  NA 148* 146* 143 141 140  K 3.2* 3.3* 3.1* 3.6 3.3*  CL 111 108 106 105 101  CO2 27 29 29 29 28   BUN 33* 31* 30* 30* 31*  CREATININE 1.37* 1.30 1.13 1.14 1.18  CALCIUM 9.5 9.6 9.5 9.7 9.6  MG 1.5 2.3  --   --  2.0  PHOS 4.7* 4.1  --   --  4.5  GLUCOSE 109* 110* 133* 107* 128*    CBG (last 3)   Recent Labs  08/30/13 0350 08/30/13 0738 08/30/13 1123  GLUCAP 117* 128* 111*    Scheduled Meds: . antiseptic oral rinse  15 mL Mouth Rinse q12n4p  . chlorhexidine  15 mL Mouth Rinse BID  . fentaNYL  100 mcg Transdermal Q72H  . furosemide  20 mg Intravenous Daily  . insulin aspart  0-9 Units Subcutaneous Q4H  . metoprolol  2.5 mg Intravenous Q12H  . micafungin (MYCAMINE) IV  100 mg Intravenous Daily  . pantoprazole  40 mg Intravenous q morning - 10a  . sodium chloride  10-40 mL Intracatheter Q12H  . sodium chloride  3 mL Intravenous Q12H    Continuous Infusions: . sodium chloride  20 mL/hr (08/30/13 1119)  . Marland KitchenTPN (CLINIMIX-E) Adult 40 mL/hr at 08/29/13 2002   And  . fat emulsion 240 mL (08/29/13 2001)  . Marland KitchenTPN (CLINIMIX-E) Adult     And  . fat emulsion    . HYDROmorphone 1 mg/hr (08/30/13 0939)    Ebbie Latus RD, LDN

## 2013-08-30 NOTE — Consult Note (Addendum)
Met with Mr. [name], his wife and his son to discuss goals of care. Today is his birthday and out meeting had several interruptions but we were nonetheless able to make some progress. He has wide spread peritoneal cancer with malignant bowel obstruction and a very complex history of interventions and follow at hospitals outside the [facility].   Summary:  1. DNR, I assessed for complete understanding- No ACLS. I also offered to go over the MOST for with them.  2. The confusion about the patient's cancer is understandable-his care has been disjointed and there has been poor continuity-He really wants to know where things stand-ie. Cancer in his lungs- cancer worse, cancer growing etc..however there is miniaml recent imaging that I can share with him from te[facility] system. Will attempt to obtain records from [facility]-I think he would do very well with seeing imaging of the cancer progression and allow him to move on. We talked about how to live well with the time he has left. He wants comforty feeding which is reasonable. Will start him on fulls.  3. Will continue ongoing goals discussion once I obtain outside records.   My sense is that this man and his family are reasonable and appreciate knowing the possibilities-they do best with information delivered in a direct way. Sorting through all of the details of his medical needs will be challenging in weighing what meds he really needs vs. meds that may not contribute to his QOL or comfort.  He requested I return tomorrow afternoon to continue sharing information and to help him understand his condition.  Anderson Malta, DO Palliative Medicine

## 2013-08-31 DIAGNOSIS — T80218A Other infection due to central venous catheter, initial encounter: Secondary | ICD-10-CM

## 2013-08-31 DIAGNOSIS — Y849 Medical procedure, unspecified as the cause of abnormal reaction of the patient, or of later complication, without mention of misadventure at the time of the procedure: Secondary | ICD-10-CM

## 2013-08-31 DIAGNOSIS — B3789 Other sites of candidiasis: Secondary | ICD-10-CM

## 2013-08-31 LAB — COMPREHENSIVE METABOLIC PANEL
ALT: 36 U/L (ref 0–53)
AST: 30 U/L (ref 0–37)
Calcium: 9.9 mg/dL (ref 8.4–10.5)
GFR calc Af Amer: 72 mL/min — ABNORMAL LOW (ref >90)
GFR calc non Af Amer: 62 mL/min — ABNORMAL LOW (ref >90)
Sodium: 138 mEq/L (ref 135–145)
Total Bilirubin: 2.7 mg/dL — ABNORMAL HIGH (ref 0.3–1.2)
Total Protein: 8.6 g/dL — ABNORMAL HIGH (ref 6.0–8.3)

## 2013-08-31 LAB — DIFFERENTIAL
Basophils Absolute: 0 10*3/uL (ref 0.0–0.1)
Basophils Relative: 0 % (ref 0–1)
Eosinophils Absolute: 0.3 10*3/uL (ref 0.0–0.7)
Monocytes Relative: 9 % (ref 3–12)
Neutro Abs: 4.5 10*3/uL (ref 1.7–7.7)
Neutrophils Relative %: 65 % (ref 43–77)

## 2013-08-31 LAB — CBC
Hemoglobin: 9.2 g/dL — ABNORMAL LOW (ref 13.0–17.0)
MCH: 28.8 pg (ref 26.0–34.0)
MCHC: 30.9 g/dL (ref 30.0–36.0)
RDW: 16.2 % — ABNORMAL HIGH (ref 11.5–15.5)

## 2013-08-31 LAB — CULTURE, BLOOD (ROUTINE X 2)

## 2013-08-31 LAB — GLUCOSE, CAPILLARY: Glucose-Capillary: 135 mg/dL — ABNORMAL HIGH (ref 70–99)

## 2013-08-31 LAB — MAGNESIUM: Magnesium: 2 mg/dL (ref 1.5–2.5)

## 2013-08-31 LAB — PHOSPHORUS: Phosphorus: 4.9 mg/dL — ABNORMAL HIGH (ref 2.3–4.6)

## 2013-08-31 MED ORDER — PROMETHAZINE HCL 25 MG/ML IJ SOLN
12.5000 mg | INTRAMUSCULAR | Status: DC | PRN
  Administered 2013-08-31 (×2): 12.5 mg via INTRAVENOUS
  Filled 2013-08-31 (×2): qty 1

## 2013-08-31 MED ORDER — TRACE MINERALS CR-CU-F-FE-I-MN-MO-SE-ZN IV SOLN
INTRAVENOUS | Status: DC
  Administered 2013-08-31: 17:00:00 via INTRAVENOUS
  Filled 2013-08-31: qty 2000

## 2013-08-31 MED ORDER — FAT EMULSION 20 % IV EMUL
240.0000 mL | INTRAVENOUS | Status: DC
  Administered 2013-08-31: 240 mL via INTRAVENOUS
  Filled 2013-08-31: qty 250

## 2013-08-31 NOTE — Progress Notes (Addendum)
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 Admitted with Fungemia-will need 8 more days of Mycofungin. Will need to arrange for home IV therapy in addition to his TNA and IV Dilaudid PCA. Prognosis remains very poor. He needs ongoing goals-for now he would desire rehospitalization for reversible problems, but he does seem to be grasping more clearly his diagnosis and choices regarding level of suffering. He has new pulmonary disease with reported malignant effusion-will need Pleurx (placed at [facility]) managed by Hospice RN and drained PRN for worsening Dyspnea.    Met with patient, his wife and his son for the purposes of discussing his cancer and prognosis-disease trajectory but it was clear to me that there was a different mood in the room than on my previous visit. Family essentially in crisis over discharge plan-there is a complete disconnect between patients wife, son and patient's brothers, sister and extended family. Patient has asserted that he wants to go home-and that there is NO other option. He seems upset by conversation regarding SNFs-even though it appears his brother and sister were inquiring about that possibility. His son who has done the majority of his care is leaving for an extended period of time in early October. His wife has to work so they can maintain his health insurance. He openly expressed anger towards his wife during my visit telling her to "move out" she left room upset and he tells me that "she has not been the same since his cancer and that she doesn't care for him anymore"--he is directing his anger towards her even though her primary stated concern is that she cannot rely on his brothers, sisters and extended family to provide consistent and reliable care-they have been highly critical of his wife as well-but his extended family also does not know how serious his condition is and  that he is nearing EOL. His son is in the "rescue role" trying to set up a schedule of family members and "train them". I also suggested that they could hire private duty in home caregivers to augment the care if needed. I strongly encouraged them to work with Hospice Providers regarding the discharge plan-I do not believe this gentleman can rehab or would be served well by spending the time he has left in a SNF. I also have other concerns about the patient's short term memory and ability to think realistically and well-may be medication effects.  Plan/Recs:  1. Patient and his wife need an intervention-there are significant issues with their relationship under the harsh strain of his illness and prognosis-they would benefit from counseling.  2. This patient deeply desires to go home- the challenge will be making that a reality. Will work with Hospice to achieve his goals. They are considering hiring private duty caregivers which would relieve some of the family caregiver burdens.  3.Maintain current medical regimen.  4. Drain Pleurx prn for dyspnea-bolus dosing of dilaudid through PCA.  Discussed with Chalmers Cater, RN with Hospice.  Anderson Malta, DO Palliative Medicine

## 2013-08-31 NOTE — Progress Notes (Signed)
Room WL 1514 - [name] - [facility] RN Visit-R.Cynai Skeens RN  NON-RELATED ADMISSION due to private insurance.  [facility] diagnosis is abdominal malignancy.  Pt is DNR code now per EPIC.    Pt lethargic & oriented, lying in bed, without complaints of pain or discomfort.  Pt's birthday was yesterday - there are posters and banners around his room celebrating his BD.  No family present. Patient's home medication list is on shadow chart.   Appreciate input from PMT Dr. Phillips Odor - she will meet with him again later today.  Will attempt to coordinate [facility] SW's presence at meeting.   Please call [facility] @ [phone]- ask for RN Liaison or after hours,ask for on-call RN with any hospice needs.   Thank you.  Alan Boers, RN  [facility]  Hospice Liaison  [phone])

## 2013-08-31 NOTE — Progress Notes (Signed)
Clinical Social Work Department BRIEF PSYCHOSOCIAL ASSESSMENT 08/31/2013  Patient:  [name], [name]     Account Number:  [id number]     Admit date:  08/23/2013  Clinical Social Worker:  Dennison Bulla  Date/Time:  08/31/2013 03:30 PM  Referred by:  Physician  Date Referred:  08/31/2013 Referred for  SNF Placement   Other Referral:   Interview type:  Patient Other interview type:    PSYCHOSOCIAL DATA Living Status:  FAMILY Admitted from facility:   Level of care:   Primary support name:  [name] Primary support relationship to patient:  SPOUSE Degree of support available:   Strong    CURRENT CONCERNS Current Concerns  Post-Acute Placement   Other Concerns:    SOCIAL WORK ASSESSMENT / PLAN CSW received referral in order to assist with DC planning. CSW spoke with MD who reports that he has spoken to patient and family who is possibly interested in SNF placement. CSW reviewed chart and met with patient, brother, and sister. CSW introduced myself and explained role.    Patient reports that he lives at home with wife but feels too weak to return home. Patient has been to LTAC in the past but does not think he has been to SNF in the past. Patient reports that he wants to go home but is understanding of why SNF is needed. Patient is agreeable to SNF if he can be placed somewhere to home. CSW provided SNF list and explained SNF process. CSW explained possible copays and the need to find SNF that is in-network with insurance.    CSW completed FL2 and completed search in [place]. CSW will follow up with bed offers.   Assessment/plan status:  Psychosocial Support/Ongoing Assessment of Needs Other assessment/ plan:   Information/referral to community resources:   SNF list    PATIENT'S/FAMILY'S RESPONSE TO PLAN OF CARE: Patient alert and oriented. Patient agreeable to [facility] search. Patient is upset because he prefers to be at home but is understanding of the need  for SNF. Patient agreeable to CSW to follow up and agreeable to family involvement.

## 2013-08-31 NOTE — Progress Notes (Signed)
[facility] PROGRESS NOTE  [name] ZOX:[id number] DOB: 21-Sep-1956 DOA: 08/23/2013 PCP: [name], MD  Assessment/Plan: #1 acute hypoxic respiratory distress Likely secondary to acute CHF exacerbation/pulmonary edema. Cannot exclude metastatic disease. Pro BNP was elevated at 5661 on 9/8 /2014. Pro calcitonin level is slowly trending down. Cardiac enzymes negative x3. 2-D echo with EF of 30-35% with systolic function moderately to severely reduced. No vegetations noted. I/0. equal -17.6L since admission; weight 2 days ago was 87.6kg from 89.5kg from 95.4 kg from 105.1 kg(08/25/13) from 105.6kg (08/23/13). Weight seems incorrect today. Lasix gtt has been changed to lasix 20mg  IV Daily. Continue low dose beta blocker if BP allows. Follow.  #2 severe sepsis Likely secondary to fungemia secondary to chronic PICC line. Doubt if PNA as patient improved rapidly with diureses. PICC removed 08/24/13. Urine Legionella antigen negative. Urine pneumococcus antigen negative. Blood cultures positive for fungemia growing Candida albicans. Urine cultures negative. Pro calcitonin level slowly trending down at 3.28 (08/26/13)  from 5.95. Repeat blood cultures pending with no growth to date. Continue  IV micafungin. 2-D echo with no vegetations noted. IV vancomycin and flagyl and cefepime discontinued. ID ff and appreciate input and rxcs.  #3 acute systolic CHF exacerbation/ nonischemic cardiomyopathy Questionable etiology.  CM maybe secondary to sepsis. Pro BNP was elevated at 5661 on 08/24/2013. 2-D echo with a depressed EF of 30-35% with moderately to severely reduced systolic function. No vegetations noted. Cardiac enzymes negative x3. I/O = -[id number] since admission. Current weight is 90.6kg from 87.6 kg from 89.5kg from 95.4 kg from 105.6 kg on admission 08/23/2013. Patient was placed on a Lasix drip which will be discontinued and changed to IV lasix daily. Continue low-dose beta blocker. And if  patient remained hemodynamically stable may consider adding low-dose ACE inhibitor. Patient with a complex medical history and currently not a candidate for invasive procedures at this point in time. Will treat medically. Follow.  #4 metastatic adenocarcinoma/peritoneal carcinomatosis status post extensive debulking procedures no abdominal surgeries, multiple drains and fistulas on chronic TPN via PICC line, chronic PEG for gastric decompression/history of small bowel obstruction status post jejunal stent/ileostomy/cholecystitis status post cholecystostomy/chronic indwelling cath Patient is currently n.p.o. TPN was held secondary to fungemia. PICC line reinserted. Patient has not had chemotherapy and greater than 9 months. Patient on home hospice however is full code. Resume TPN.  Palliative care meeting today for goals of care.  #5 anemia of chronic disease Stable. Follow H&H. Transfusion threshold hemoglobin less than 7.   #6 fungemia Likely secondary to chronic PICC line. PICC line has been removed on 08/24/2013. 2-D echo with a depressed EF of 30-35% with no vegetations noted. Continue empiric IV micafungin. Will need minimum of 2 weeks per ID through Sept 23 2014. ID ff and appreciate input and rxcs.  #7 severe protein calorie malnutrition Secondary to problem #4. TPN was held secondary to severe sepsis. Repeat blood cultures are negative to date.  PICC line placed. TPN started.   #8 oral thrush On micafungin.  #9 hypokalemia Secondary to diureses. Replete. BMET in AM.  #10 Decubitus ulcer Continue wound care  #11 prophylaxis PPI for GI prophylaxis. SCDs for DVT prophylaxis.   Code Status: Full Family Communication: Updated patient no family present. Disposition Plan: Home when medically stable   Consultants:  PCCM: Dr. Tyson Alias 08/24/2013  Infectious disease Dr. Orvan Falconer 08/26/13  Palliative Care: Dr Phillips Odor 08/30/13  Procedures:  Chest x-ray 08/23/2013, 08/27/13  2-D  echo 08/24/2013  CT CHEST  08/24/2013  PICC line removed 08/24/2013  PICC line placed 08/29/13  Antibiotics: Vancomycin 08/23/2013 -->08/26/13  Cefepime 08/23/2013 --> 08/27/13 Flagyl 08/24/2013 --> 08/26/13 Micafungin 08/24/2013 -->  Fluconazole 08/23/2013 --> 08/24/2013    HPI/Subjective: Patient with no complaints. Feels breathing close to baseline.  Objective: Filed Vitals:   08/31/13 0535  BP: 113/77  Pulse: 78  Temp: 98.7 F (37.1 C)  Resp: 20    Intake/Output Summary (Last 24 hours) at 08/31/13 1501 Last data filed at 08/31/13 1413  Gross per 24 hour  Intake   1264 ml  Output   1200 ml  Net     64 ml   Filed Weights   08/30/13 0524 08/30/13 1328 08/31/13 0531  Weight: 90.6 kg (199 lb 11.8 oz) 93.1 kg (205 lb 4 oz) 91.3 kg (201 lb 4.5 oz)    Exam:   General:  Laying in bed. Talking in full sentences.  Cardiovascular: RRR  Respiratory: CTAB  Abdomen: Soft/NT/ND/+BS/ Multiple drains on abdomen noted. Ileostomy, fistula and PEG noted. Pleural drain in place.  Extremities: No clubbing cyanosis or edema.  Neurological: Intact.  Data Reviewed: Basic Metabolic Panel:  Recent Labs Lab 08/26/13 2200 08/27/13 1130 08/27/13 2315 08/28/13 1115 08/29/13 0325 08/30/13 0500 08/31/13 0438  NA 145 148* 146* 143 141 140 138  K 3.1* 3.2* 3.3* 3.1* 3.6 3.3* 3.7  CL 107 111 108 106 105 101 101  CO2 28 27 29 29 29 28 27   GLUCOSE 83 109* 110* 133* 107* 128* 146*  BUN 30* 33* 31* 30* 30* 31* 37*  CREATININE 1.33 1.37* 1.30 1.13 1.14 1.18 1.26  CALCIUM 9.8 9.5 9.6 9.5 9.7 9.6 9.9  MG 1.6 1.5 2.3  --   --  2.0 2.0  PHOS 4.6 4.7* 4.1  --   --  4.5 4.9*   Liver Function Tests:  Recent Labs Lab 08/25/13 0334 08/30/13 0500 08/31/13 0438  AST 56* 32 30  ALT 56* 38 36  ALKPHOS 168* 268* 268*  BILITOT 2.7* 2.7* 2.7*  PROT 6.8 8.4* 8.6*  ALBUMIN 1.9* 2.4* 2.5*   No results found for this basename: LIPASE, AMYLASE,  in the last 168 hours No results found for this  basename: AMMONIA,  in the last 168 hours CBC:  Recent Labs Lab 08/27/13 0705 08/28/13 0325 08/29/13 0325 08/30/13 0500 08/31/13 0438  WBC 6.5 6.8 6.2 5.7 7.0  NEUTROABS 5.1  --   --  3.7 4.5  HGB 8.6* 9.4* 9.6* 9.1* 9.2*  HCT 27.0* 29.8* 30.4* 29.5* 29.8*  MCV 92.2 91.7 92.7 93.1 93.1  PLT 158 180 167 128* 120*   Cardiac Enzymes:  Recent Labs Lab 08/26/13 1010 08/26/13 1523 08/26/13 2200  TROPONINI <0.30 <0.30 <0.30   BNP (last 3 results)  Recent Labs  12/22/12 1700 08/23/13 0930 08/24/13 1148  PROBNP 33.8 9174.0* 5661.0*   CBG:  Recent Labs Lab 08/30/13 2012 08/30/13 2355 08/31/13 0455 08/31/13 0733 08/31/13 1203  GLUCAP 123* 123* 135* 137* 130*    Recent Results (from the past 240 hour(s))  CULTURE, BLOOD (ROUTINE X 2)     Status: None   Collection Time    08/23/13  9:30 AM      Result Value Range Status   Specimen Description BLOOD PICC LEFT ARM  5 ML IN [facility] BOTTLE   Final   Special Requests NONE   Final   Culture  Setup Time     Final   Value: 08/23/2013 16:16  Performed at [employer] [facility]     Final   Value: CANDIDA ALBICANS     Note: Gram Stain Report Called to,Read Back By and Verified With: TIA JOHNSON 08/24/13 0940 BY SMITHERSJ     Performed at [facility]   Report Status PENDING   Incomplete  MRSA PCR SCREENING     Status: Abnormal   Collection Time    08/23/13 11:52 AM      Result Value Range Status   MRSA by PCR POSITIVE (*) NEGATIVE Final   Comment:            The GeneXpert MRSA Assay (FDA     approved for NASAL specimens     only), is one component of a     comprehensive MRSA colonization     surveillance program. It is not     intended to diagnose MRSA     infection nor to guide or     monitor treatment for     MRSA infections.     RESULT CALLED TO, READ BACK BY AND VERIFIED WITH:     ASHLEY,A AT 1358 ON [id number] BY POTEAT,S  URINE CULTURE     Status: None   Collection Time    08/23/13  3:27 PM       Result Value Range Status   Specimen Description URINE, RANDOM   Final   Special Requests NONE   Final   Culture  Setup Time     Final   Value: 08/23/2013 22:07     Performed at [facility] Count     Final   Value: NO GROWTH     Performed at [facility]   Culture     Final   Value: NO GROWTH     Performed at [facility]   Report Status 08/24/2013 FINAL   Final  CULTURE, BLOOD (ROUTINE X 2)     Status: None   Collection Time    08/25/13 12:35 PM      Result Value Range Status   Specimen Description BLOOD RIGHT HAND   Final   Special Requests BOTTLES DRAWN AEROBIC ONLY 4CC   Final   Culture  Setup Time     Final   Value: 08/25/2013 15:06     Performed at [facility]   Culture     Final   Value: NO GROWTH 5 DAYS     Performed at [facility]   Report Status 08/31/2013 FINAL   Final  CULTURE, BLOOD (ROUTINE X 2)     Status: None   Collection Time    08/25/13 12:35 PM      Result Value Range Status   Specimen Description BLOOD LEFT ARM   Final   Special Requests     Final   Value: BOTTLES DRAWN AEROBIC AND ANAEROBIC 10CC AER 5CC ANA   Culture  Setup Time     Final   Value: 08/25/2013 15:06     Performed at [facility]   Culture     Final   Value: NO GROWTH 5 DAYS     Performed at [facility]   Report Status 08/31/2013 FINAL   Final  CULTURE, BLOOD (ROUTINE X 2)     Status: None   Collection Time    08/26/13 10:10 AM      Result Value Range Status   Specimen Description BLOOD LEFT ARM   Final  Special Requests     Final   Value: BOTTLES DRAWN AEROBIC AND ANAEROBIC 5CC ANA 10CC AER   Culture  Setup Time     Final   Value: 08/26/2013 13:28     Performed at [facility]   Culture     Final   Value:        BLOOD CULTURE RECEIVED NO GROWTH TO DATE CULTURE WILL BE HELD FOR 5 DAYS BEFORE ISSUING A FINAL NEGATIVE REPORT     Performed at [facility]   Report Status PENDING   Incomplete   CULTURE, BLOOD (ROUTINE X 2)     Status: None   Collection Time    08/26/13 10:15 AM      Result Value Range Status   Specimen Description BLOOD RIGHT ARM   Final   Special Requests BOTTLES DRAWN AEROBIC AND ANAEROBIC 4CC   Final   Culture  Setup Time     Final   Value: 08/26/2013 13:28     Performed at [facility]   Culture     Final   Value:        BLOOD CULTURE RECEIVED NO GROWTH TO DATE CULTURE WILL BE HELD FOR 5 DAYS BEFORE ISSUING A FINAL NEGATIVE REPORT     Performed at [facility]   Report Status PENDING   Incomplete     Studies: No results found.  Scheduled Meds: . antiseptic oral rinse  15 mL Mouth Rinse q12n4p  . chlorhexidine  15 mL Mouth Rinse BID  . fentaNYL  100 mcg Transdermal Q72H  . furosemide  20 mg Intravenous Daily  . insulin aspart  0-9 Units Subcutaneous Q4H  . metoprolol  2.5 mg Intravenous Q12H  . micafungin (MYCAMINE) IV  100 mg Intravenous Daily  . pantoprazole  40 mg Intravenous q morning - 10a  . sodium chloride  10-40 mL Intracatheter Q12H  . sodium chloride  3 mL Intravenous Q12H   Continuous Infusions: . sodium chloride 20 mL/hr at 08/30/13 1400  . Marland KitchenTPN (CLINIMIX-E) Adult 60 mL/hr at 08/30/13 1748   And  . fat emulsion 240 mL (08/30/13 1747)  . Marland KitchenTPN (CLINIMIX-E) Adult     And  . fat emulsion    . HYDROmorphone 1 mg/hr (08/30/13 0939)    Principal Problem:   Acute respiratory failure with hypoxia Active Problems:   Abdominal malignant neoplasm   Metastatic adenocarcinoma   Peritoneal carcinomatosis   Anemia of chronic disease   Colo-enteric fistula   Severe protein-calorie malnutrition   Acute renal failure   Sepsis   Fungemia   Hypokalemia   Hypernatremia   Recurrent bacteremia   Pleural effusion    Time spent: > 40 mins    [facility]  [facility] Pager [phone]. If 7PM-7AM, please contact night-coverage at [email or web address], password [facility] 08/31/2013, 3:01 PM  LOS: 8 days

## 2013-08-31 NOTE — Progress Notes (Addendum)
Physical Therapy Treatment Patient Details Name: [name] MRN: [id number] DOB: July 26, 1956 Today's Date: 08/31/2013 Time: 1000-1025 PT Time Calculation (min): 25 min  PT Assessment / Plan / Recommendation  History of Present Illness 57 y.o. male with complicated medical problems including metastatic adenocarcinoma, peritoneal carcinomatosis status post extensive debulking procedures and abdominal surgeries by Dr. [name] which has left him with multiple drains and fistulas, on chronic TPN via a PICC line, chronic PEG for gastric decompression, history of small bowel obstruction status post jejunal stent, ileostomy, cholecystitis status post cholecystostomy and chronic indwelling drain. He presented to [facility] ED with main concern of progressively worsening shortness of breath, present at rest and with minimal exertion, associated with subjective fevers, chills, malaise, started several days prior to the admission and with no specific alleviating factors. Pt is currently under hospice care but is full code. He has pleurex cath placed for chronic symptoms management and explains that has been helping him. He denies any specific focal neurological symptoms but explains he is tired and feels week. He denies any specific abdominal or urinary concerns.    PT Comments   Pt feeling very weak but willing to try. Assisted to EOB with Mod Assist.  Sat EOB x 5 min to adjust.  Assisted to standing + 2 assist and attempted to stand several min however unable to tolerate longer than 20 seconds.  Quickly took a few side steps to recliner. Pt c/o MAX fatigue and demon limited activity tolerance.    Follow Up Recommendations  SNF     Does the patient have the potential to tolerate intense rehabilitation     Barriers to Discharge        Equipment Recommendations       Recommendations for Other Services    Frequency Min 3X/week   Progress towards PT Goals Progress towards PT goals: Progressing toward goals   Plan      Precautions / Restrictions Precautions Precautions: Fall Restrictions Weight Bearing Restrictions: No    Pertinent Vitals/Pain No c/o pain    Mobility  Bed Mobility Bed Mobility: Supine to Sit Supine to Sit: 3: Mod assist Details for Bed Mobility Assistance: increased time and Mos assist to rise upper body Transfers Transfers: Sit to Stand;Stand to Sit Sit to Stand: 1: +2 Total assist Sit to Stand: Patient Percentage: 70% Stand to Sit: 1: +2 Total assist Stand to Sit: Patient Percentage: 60% Details for Transfer Assistance: elevated pt pt was able to rise to standing.  Pre gait static standing performed however pt was only able to tolerate approx 20 seconds before c/o B LE weakness and ganeral/overall fatigue.  pt took a few side/turn steps to recliner quickly as pt was fatiguing quickly. Ambulation/Gait Ambulation/Gait Assistance Details: unable to attempt 2nd severe c/o fatigue and B LE weakness.   Exercises: 10 reps B AP 10 reps B LAQ's   PT Goals (current goals can now be found in the care plan section)    Visit Information  Last PT Received On: 08/31/13 Assistance Needed: +2 History of Present Illness: 57 y.o. male with complicated medical problems including metastatic adenocarcinoma, peritoneal carcinomatosis status post extensive debulking procedures and abdominal surgeries by Dr. [name] which has left him with multiple drains and fistulas, on chronic TPN via a PICC line, chronic PEG for gastric decompression, history of small bowel obstruction status post jejunal stent, ileostomy, cholecystitis status post cholecystostomy and chronic indwelling drain. He presented to [facility] ED with main concern of progressively worsening shortness of  breath, present at rest and with minimal exertion, associated with subjective fevers, chills, malaise, started several days prior to the admission and with no specific alleviating factors. Pt is currently under hospice care but is full  code. He has pleurex cath placed for chronic symptoms management and explains that has been helping him. He denies any specific focal neurological symptoms but explains he is tired and feels week. He denies any specific abdominal or urinary concerns.     Subjective Data      Cognition       Balance     End of Session PT - End of Session Equipment Utilized During Treatment: Gait belt Activity Tolerance: Patient limited by fatigue Patient left: in chair;with call bell/phone within reach   Felecia Shelling  PTA [facility]  Acute  Rehab Pager      [phone]

## 2013-08-31 NOTE — Progress Notes (Signed)
RM 1514 Cecile Hearing                      [facility] SW Note:  Pt reported feeling nauseated and short of breath but denied pain. His sister and brother were at the bedside. They had just spoken with hospital CSW regarding SNF for rehab. SW explained that pt will have to revoke hospice for rehab at [facility] but services can be resumed once he returned home. Pt and family denied any immediate needs or concerns.Sw will continue to follow to extend support as needed.Exie Parody [place],LCSW

## 2013-08-31 NOTE — Progress Notes (Signed)
PARENTERAL NUTRITION CONSULT NOTE - follow-up  Pharmacy Consult for TNA Indication: Hx Bowel Obstruction, Protein Calorie Malnutrition  Allergies  Allergen Reactions  . Percocet [Oxycodone-Acetaminophen] Nausea And Vomiting    hallucination    Patient Measurements: Height: 6\' 4"  (193 cm) Weight: 201 lb 4.5 oz (91.3 kg) IBW/kg (Calculated) : 86.8  Vital Signs: Temp: 98.7 F (37.1 C) (09/15 0535) Temp src: Oral (09/15 0535) BP: 113/77 mmHg (09/15 0535) Pulse Rate: 78 (09/15 0535) Intake/Output from previous day: 09/14 0701 - 09/15 0700 In: 2157.7 [I.V.:283.7; IV Piggyback:500; TPN:1374] Out: 2200 [Urine:250; Drains:1950] Intake/Output from this shift:    Labs:  Recent Labs  08/29/13 0325 08/30/13 0500 08/31/13 0438  WBC 6.2 5.7 7.0  HGB 9.6* 9.1* 9.2*  HCT 30.4* 29.5* 29.8*  PLT 167 128* 120*     Recent Labs  08/29/13 0325 08/30/13 0500 08/31/13 0438  NA 141 140 138  K 3.6 3.3* 3.7  CL 105 101 101  CO2 29 28 27   GLUCOSE 107* 128* 146*  BUN 30* 31* 37*  CREATININE 1.14 1.18 1.26  CALCIUM 9.7 9.6 9.9  MG  --  2.0 2.0  PHOS  --  4.5 4.9*  PROT  --  8.4* 8.6*  ALBUMIN  --  2.4* 2.5*  AST  --  32 30  ALT  --  38 36  ALKPHOS  --  268* 268*  BILITOT  --  2.7* 2.7*  PREALBUMIN  --  12.3*  --   TRIG  --  278* 282*   Estimated Creatinine Clearance: 79.4 ml/min (by C-G formula based on Cr of 1.26).    Recent Labs  08/30/13 1123 08/30/13 1633 08/30/13 2012  GLUCAP 111* 117* 123*    Medical History: Past Medical History  Diagnosis Date  . Hypertension   . GERD (gastroesophageal reflux disease)   . Small bowel obstruction 2013  . 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 Acute cholecystitis s/p perc draianage [id number] 12/20/2012  . Anemia of chronic disease 12/19/2012  . 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 MRSA bacteremia 01/04/2013  . SBO (small bowel  obstruction) 11/07/2011  . Septic shock due to Staphylococcus aureus 12/24/2012  . Severe protein-calorie malnutrition 01/17/2013  . Hospice care patient     Medications:  Scheduled:  . antiseptic oral rinse  15 mL Mouth Rinse q12n4p  . chlorhexidine  15 mL Mouth Rinse BID  . fentaNYL  100 mcg Transdermal Q72H  . furosemide  20 mg Intravenous Daily  . insulin aspart  0-9 Units Subcutaneous Q4H  . metoprolol  2.5 mg Intravenous Q12H  . micafungin (MYCAMINE) IV  100 mg Intravenous Daily  . pantoprazole  40 mg Intravenous q morning - 10a  . sodium chloride  10-40 mL Intracatheter Q12H  . sodium chloride  3 mL Intravenous Q12H   Infusions:  . sodium chloride 20 mL/hr at 08/30/13 1400  . Marland KitchenTPN (CLINIMIX-E) Adult 60 mL/hr at 08/30/13 1748   And  . fat emulsion 240 mL (08/30/13 1747)  . HYDROmorphone 1 mg/hr (08/30/13 0939)    Insulin Requirements in the past 24 hours:  2 units Novolog SSI since 18:00 9/13  Nutritional Goals:  Home TNA provided 2828 kcal/day, 132 g Protein/day, and 2400 ml/day.  Clinimix E 5/20 at 164ml/hr + IV lipids at 64ml/hr will provide 2800 kcal/day and 132g protein/day.  Current Nutrition:  NPO  IVF:  NS at [facility]  TPN Access: PICC re-placed 9/13   Glucose - BGs remain within goal  Electrolytes - K = 3.7 after replaced yesterday, Phos = 4.9 just above ULN of goal range up from 4.5 yesterday , Corr Ca elevated and rising some = 11.1 for Phos x Ca product = 54 (goal <55).  Renal - SCr WNL, I/O = -42.73ml/24h (? Accuracy as TPN fluid not listed), - from drains. Wt pretty stable - fluctuating slightly around 200lb  LFTs - Alk phos elevated but stable, Tbili elevated = 2.7 but stable  TGs - elevated a little but stable - 278 (9/14), 282 (9/15)  Prealbumin - 12.3 (9/14)   Assessment: 57 yo M on chronic TNA at home due to hx of bowel obstruction, EC fistula, abdominal carcinomatosis s/p extensive debulking procedures, multiple drains, and protein calorie  malnutrition. Home TNA provides 2828 kcal/day, 132 g Protein/day, and 2400 ml/day. TNA was running at 157ml/hr at home, but had been discontinued due to fungemia (candida albicans). PICC line was removed. 2-D ECHO (-) for vegetation per MD note. Repeat blood cultures from 3 days ago remain negative, therefore ok to replace PICC per ID. TNA resumed per Pharmacy consult on 9/13.  9/15: D#3 TPN - Will need to be careful with fluid from TNA due to acute CHF exacerbation on admission. Currently is essentially neutral fluid balance, and down 17 L since admission.  Plan:  1) At 6pm tonight, increase Clinimix E 5/20 to 85ml/hr and IV lipids at 49ml/hr  Watch Phos/Calcium as they are trending upwards and signs of fluid overload - unable to concentrate as using premixed Clinimix product d/t electrolyte shortages. Watch weight. 2) Continue NS at [facility] 3) MVI, TE daily 4) Will check CBGs q4 hours initially as titrating to goal   Hessie Knows, PharmD, BCPS Pager [phone] 08/31/2013 9:12 AM

## 2013-08-31 NOTE — Progress Notes (Addendum)
Clinical Social Work Department CLINICAL SOCIAL WORK PLACEMENT NOTE 08/31/2013  Patient:  [name], [name]  Account Number:  [id number] Admit date:  08/23/2013  Clinical Social Worker:  Unk Lightning, LCSW  Date/time:  08/31/2013 03:30 PM  Clinical Social Work is seeking post-discharge placement for this patient at the following level of care:   SKILLED NURSING   (*CSW will update this form in Epic as items are completed)   08/31/2013  Patient/family provided with [facility] Department of Clinical Social Work's list of facilities offering this level of care within the geographic area requested by the patient (or if unable, by the patient's family).  08/31/2013  Patient/family informed of their freedom to choose among providers that offer the needed level of care, that participate in Medicare, Medicaid or managed care program needed by the patient, have an available bed and are willing to accept the patient.  08/31/2013  Patient/family informed of [facility]' ownership interest in [facility], as well as of the fact that they are under no obligation to receive care at this facility.  PASARR submitted to EDS on 08/31/2013 PASARR number received from [employer] on   FL2 transmitted to all facilities in geographic area requested by pt/family on  08/31/2013 FL2 transmitted to all facilities within larger geographic area on   Patient informed that his/her managed care company has contracts with or will negotiate with  certain facilities, including the following:     Patient/family informed of bed offers received:  09/01/13 Patient chooses bed at  Physician recommends and patient chooses bed at    Patient to be transferred to  on   Patient to be transferred to facility by   The following physician request were entered in Epic:   Additional Comments: Patient to return home with family. No longer desires SNF placement.

## 2013-08-31 NOTE — Progress Notes (Signed)
Patient ID: [name], male   DOB: [birth year], 57 y.o.   MRN: [id number]         [facility]    Date of Admission:  08/23/2013           Day 9 micafungin        Day 6 after first negative blood culture  Principal Problem:   Acute respiratory failure with hypoxia Active Problems:   Fungemia   Sepsis   Recurrent bacteremia   Abdominal malignant neoplasm   Metastatic adenocarcinoma   Peritoneal carcinomatosis   Anemia of chronic disease   Colo-enteric fistula   Severe protein-calorie malnutrition   Acute renal failure   Hypokalemia   Hypernatremia   Pleural effusion   . antiseptic oral rinse  15 mL Mouth Rinse q12n4p  . chlorhexidine  15 mL Mouth Rinse BID  . fentaNYL  100 mcg Transdermal Q72H  . furosemide  20 mg Intravenous Daily  . insulin aspart  0-9 Units Subcutaneous Q4H  . metoprolol  2.5 mg Intravenous Q12H  . micafungin (MYCAMINE) IV  100 mg Intravenous Daily  . pantoprazole  40 mg Intravenous q morning - 10a  . sodium chloride  10-40 mL Intracatheter Q12H  . sodium chloride  3 mL Intravenous Q12H    Subjective: He is feeling better.  Objective: Temp:  [97.4 F (36.3 C)-98.7 F (37.1 C)] 98.7 F (37.1 C) (09/15 0535) Pulse Rate:  [78-90] 78 (09/15 0535) Resp:  [20] 20 (09/15 0535) BP: (101-113)/(67-77) 113/77 mmHg (09/15 0535) SpO2:  [97 %-98 %] 97 % (09/15 0535) Weight:  [91.3 kg (201 lb 4.5 oz)] 91.3 kg (201 lb 4.5 oz) (09/15 0531)  General: he is smiling, sitting up in a chair Skin: new right arm PICC site normal Lungs: clear Cor: regular S1-S2 no murmurs Abdomen: soft and nontender   Lab Results Lab Results  Component Value Date   WBC 7.0 08/31/2013   HGB 9.2* 08/31/2013   HCT 29.8* 08/31/2013   MCV 93.1 08/31/2013   PLT 120* 08/31/2013    Lab Results  Component Value Date   CREATININE 1.26 08/31/2013   BUN 37* 08/31/2013   NA 138 08/31/2013   K 3.7 08/31/2013   CL 101 08/31/2013   CO2 27 08/31/2013    Lab  Results  Component Value Date   ALT 36 08/31/2013   AST 30 08/31/2013   ALKPHOS 268* 08/31/2013   BILITOT 2.7* 08/31/2013      Microbiology: Recent Results (from the past 240 hour(s))  CULTURE, BLOOD (ROUTINE X 2)     Status: None   Collection Time    08/23/13  9:30 AM      Result Value Range Status   Specimen Description BLOOD PICC LEFT ARM  5 ML IN [facility] BOTTLE   Final   Special Requests NONE   Final   Culture  Setup Time     Final   Value: 08/23/2013 16:16     Performed at [facility]   Culture     Final   Value: CANDIDA ALBICANS     Note: Gram Stain Report Called to,Read Back By and Verified With: TIA JOHNSON 08/24/13 0940 BY SMITHERSJ     Performed at [facility]   Report Status PENDING   Incomplete  MRSA PCR SCREENING     Status: Abnormal   Collection Time    08/23/13 11:52 AM      Result Value Range Status  MRSA by PCR POSITIVE (*) NEGATIVE Final   Comment:            The GeneXpert MRSA Assay (FDA     approved for NASAL specimens     only), is one component of a     comprehensive MRSA colonization     surveillance program. It is not     intended to diagnose MRSA     infection nor to guide or     monitor treatment for     MRSA infections.     RESULT CALLED TO, READ BACK BY AND VERIFIED WITH:     ASHLEY,A AT 1358 ON [id number] BY POTEAT,S  URINE CULTURE     Status: None   Collection Time    08/23/13  3:27 PM      Result Value Range Status   Specimen Description URINE, RANDOM   Final   Special Requests NONE   Final   Culture  Setup Time     Final   Value: 08/23/2013 22:07     Performed at [facility] Count     Final   Value: NO GROWTH     Performed at [facility]   Culture     Final   Value: NO GROWTH     Performed at [facility]   Report Status 08/24/2013 FINAL   Final  CULTURE, BLOOD (ROUTINE X 2)     Status: None   Collection Time    08/25/13 12:35 PM      Result Value Range Status   Specimen Description  BLOOD RIGHT HAND   Final   Special Requests BOTTLES DRAWN AEROBIC ONLY 4CC   Final   Culture  Setup Time     Final   Value: 08/25/2013 15:06     Performed at [facility]   Culture     Final   Value: NO GROWTH 5 DAYS     Performed at [facility]   Report Status 08/31/2013 FINAL   Final  CULTURE, BLOOD (ROUTINE X 2)     Status: None   Collection Time    08/25/13 12:35 PM      Result Value Range Status   Specimen Description BLOOD LEFT ARM   Final   Special Requests     Final   Value: BOTTLES DRAWN AEROBIC AND ANAEROBIC 10CC AER 5CC ANA   Culture  Setup Time     Final   Value: 08/25/2013 15:06     Performed at [facility]   Culture     Final   Value: NO GROWTH 5 DAYS     Performed at [facility]   Report Status 08/31/2013 FINAL   Final  CULTURE, BLOOD (ROUTINE X 2)     Status: None   Collection Time    08/26/13 10:10 AM      Result Value Range Status   Specimen Description BLOOD LEFT ARM   Final   Special Requests     Final   Value: BOTTLES DRAWN AEROBIC AND ANAEROBIC 5CC ANA 10CC AER   Culture  Setup Time     Final   Value: 08/26/2013 13:28     Performed at [facility]   Culture     Final   Value:        BLOOD CULTURE RECEIVED NO GROWTH TO DATE CULTURE WILL BE HELD FOR 5 DAYS BEFORE ISSUING A FINAL NEGATIVE REPORT  Performed at [facility]   Report Status PENDING   Incomplete  CULTURE, BLOOD (ROUTINE X 2)     Status: None   Collection Time    08/26/13 10:15 AM      Result Value Range Status   Specimen Description BLOOD RIGHT ARM   Final   Special Requests BOTTLES DRAWN AEROBIC AND ANAEROBIC 4CC   Final   Culture  Setup Time     Final   Value: 08/26/2013 13:28     Performed at [facility]   Culture     Final   Value:        BLOOD CULTURE RECEIVED NO GROWTH TO DATE CULTURE WILL BE HELD FOR 5 DAYS BEFORE ISSUING A FINAL NEGATIVE REPORT     Performed at [facility]   Report Status PENDING    Incomplete    Studies/Results: No results found.  Assessment: He is improving on therapy for PICC related to Candida albicans fungemia. He needs a more days of micafungin to complete 2 weeks of therapy after first a negative blood culture.  Plan: 1. Continue micafungin 8 more days  Cliffton Asters, MD [facility] Infect[facility] Disease [facility] [phone] pager   [phone] cell 08/31/2013, 2:45 PM

## 2013-08-31 NOTE — Progress Notes (Signed)
Clinical Social Work  CSW following to assist with DC planning as needed. CSW spoke with Hospice RN Okey Dupre) who spoke about family dynamics and plan for patient. Patient was at home with wife and son with hospice following but son is leaving for [place] in October. Hospice RN reports that patient might need SNF placement to assist with needs but advised that PMT is meeting with family again this afternoon. CSW will follow up with family after PMT has met with patient and family but can be contacted if needs arise before meeting.  [place], [place] [phone]

## 2013-09-01 DIAGNOSIS — R404 Transient alteration of awareness: Secondary | ICD-10-CM

## 2013-09-01 DIAGNOSIS — C649 Malignant neoplasm of unspecified kidney, except renal pelvis: Secondary | ICD-10-CM

## 2013-09-01 DIAGNOSIS — Z515 Encounter for palliative care: Secondary | ICD-10-CM

## 2013-09-01 DIAGNOSIS — G893 Neoplasm related pain (acute) (chronic): Secondary | ICD-10-CM

## 2013-09-01 LAB — COMPREHENSIVE METABOLIC PANEL
ALT: 36 U/L (ref 0–53)
AST: 34 U/L (ref 0–37)
Albumin: 2.5 g/dL — ABNORMAL LOW (ref 3.5–5.2)
CO2: 26 mEq/L (ref 19–32)
Calcium: 10.3 mg/dL (ref 8.4–10.5)
Creatinine, Ser: 1.16 mg/dL (ref 0.50–1.35)
Sodium: 139 mEq/L (ref 135–145)
Total Protein: 8.7 g/dL — ABNORMAL HIGH (ref 6.0–8.3)

## 2013-09-01 LAB — CULTURE, BLOOD (ROUTINE X 2): Culture: NO GROWTH

## 2013-09-01 LAB — GLUCOSE, CAPILLARY
Glucose-Capillary: 137 mg/dL — ABNORMAL HIGH (ref 70–99)
Glucose-Capillary: 137 mg/dL — ABNORMAL HIGH (ref 70–99)
Glucose-Capillary: 149 mg/dL — ABNORMAL HIGH (ref 70–99)
Glucose-Capillary: 152 mg/dL — ABNORMAL HIGH (ref 70–99)

## 2013-09-01 LAB — PHOSPHORUS: Phosphorus: 5.1 mg/dL — ABNORMAL HIGH (ref 2.3–4.6)

## 2013-09-01 MED ORDER — FAT EMULSION 20 % IV EMUL
240.0000 mL | INTRAVENOUS | Status: AC
  Administered 2013-09-01: 240 mL via INTRAVENOUS
  Filled 2013-09-01: qty 250

## 2013-09-01 MED ORDER — PROMETHAZINE HCL 25 MG/ML IJ SOLN
6.2500 mg | INTRAMUSCULAR | Status: DC | PRN
  Administered 2013-09-01 – 2013-09-02 (×3): 6.25 mg via INTRAVENOUS
  Filled 2013-09-01 (×3): qty 1

## 2013-09-01 MED ORDER — CLINIMIX/DEXTROSE (5/15) 5 % IV SOLN
INTRAVENOUS | Status: AC
  Administered 2013-09-01: 18:00:00 via INTRAVENOUS
  Filled 2013-09-01: qty 2000

## 2013-09-01 MED ORDER — FAT EMULSION 20 % IV EMUL
240.0000 mL | INTRAVENOUS | Status: AC
  Filled 2013-09-01: qty 250

## 2013-09-01 MED ORDER — TRACE MINERALS CR-CU-F-FE-I-MN-MO-SE-ZN IV SOLN
INTRAVENOUS | Status: AC
  Filled 2013-09-01: qty 2000

## 2013-09-01 NOTE — Progress Notes (Signed)
Clinical Social Work  CSW has reviewed chart and notes regarding DC plans. Per PMT, it appears there is struggle between family regarding DC plans. CSW continues to meet and talk with patient. Patient reported on 9/15 that he would be interested in SNF placement if it was close to home. CSW followed up with patient to give bed offers. Patient reviewed list but stated that facilities listed are not close to home. Patient reports he is still discussing plans with wife and family but feels he will most likely DC home. CSW will continue to follow and will assist with DC planning.  [place], [place] [phone]

## 2013-09-01 NOTE — Progress Notes (Signed)
[facility] PROGRESS NOTE  [name] AVW:[id number] DOB: 1956-11-03 DOA: 08/23/2013 PCP: [name], MD  Assessment/Plan: #1 acute hypoxic respiratory distress Likely secondary to acute CHF exacerbation/pulmonary edema. Cannot exclude metastatic disease. Pro BNP was elevated at 5661 on 9/8 /2014. Pro calcitonin level is slowly trending down. Cardiac enzymes negative x3. 2-D echo with EF of 30-35% with systolic function moderately to severely reduced. No vegetations noted. I/0. equal -18.4L since admission; weight 3 days ago was 87.6kg from 89.5kg from 95.4 kg from 105.1 kg(08/25/13) from 105.6kg (08/23/13). Weight seems incorrect today. Lasix gtt has been changed to lasix 20mg  IV Daily. Continue low dose beta blocker. Follow.  #2 severe sepsis Likely secondary to fungemia secondary to chronic PICC line. Doubt if PNA as patient improved rapidly with diureses. PICC removed 08/24/13. Urine Legionella antigen negative. Urine pneumococcus antigen negative. Blood cultures positive for fungemia growing Candida albicans. Urine cultures negative. Pro calcitonin level slowly trending down at 3.28 (08/26/13)  from 5.95. Repeat blood cultures pending with no growth to date. Continue  IV micafungin. 2-D echo with no vegetations noted. IV vancomycin and flagyl and cefepime discontinued. ID ff and appreciate input and rxcs.  #3 acute systolic CHF exacerbation/ nonischemic cardiomyopathy Questionable etiology.  CM maybe secondary to sepsis. Pro BNP was elevated at 5661 on 08/24/2013. 2-D echo with a depressed EF of 30-35% with moderately to severely reduced systolic function. No vegetations noted. Cardiac enzymes negative x3. I/O = -18,471.2cc since admission. Current weight is 90.6kg from 87.6 kg from 89.5kg from 95.4 kg from 105.6 kg on admission 08/23/2013. Patient was placed on a Lasix drip which will be discontinued and changed to IV lasix daily. Continue low-dose beta blocker. And if patient remained  hemodynamically stable may consider adding low-dose ACE inhibitor. Patient with a complex medical history and currently not a candidate for invasive procedures at this point in time. Will treat medically. Follow.  #4 metastatic adenocarcinoma/peritoneal carcinomatosis status post extensive debulking procedures no abdominal surgeries, multiple drains and fistulas on chronic TPN via PICC line, chronic PEG for gastric decompression/history of small bowel obstruction status post jejunal stent/ileostomy/cholecystitis status post cholecystostomy/chronic indwelling cath Patient is currently n.p.o. TPN was held secondary to fungemia. PICC line reinserted. Patient has not had chemotherapy and greater than 9 months. Patient on home hospice however is full code. Resume TPN.  Palliative care meeting held yesterday for goals of care.  #5 anemia of chronic disease Stable. Follow H&H. Transfusion threshold hemoglobin less than 7.   #6 fungemia Likely secondary to chronic PICC line. PICC line has been removed on 08/24/2013. 2-D echo with a depressed EF of 30-35% with no vegetations noted. Continue empiric IV micafungin. Will need minimum of 2 weeks per ID through Sept 23 2014. ID ff and appreciate input and rxcs.  #7 severe protein calorie malnutrition Secondary to problem #4. TPN was held secondary to severe sepsis. Repeat blood cultures are negative to date.  PICC line placed. TPN started.   #8 oral thrush On micafungin.  #9 hypokalemia Secondary to diureses. Repleted. BMET in AM.  #10 Decubitus ulcer Continue wound care  #11 prophylaxis PPI for GI prophylaxis. SCDs for DVT prophylaxis.   Code Status: Full Family Communication: Updated patient no family present. Disposition Plan: Home when medically stable hopefully tomorrow.   Consultants:  PCCM: Dr. Tyson Alias 08/24/2013  Infectious disease Dr. Orvan Falconer 08/26/13  Palliative Care: Dr Phillips Odor 08/30/13  Procedures:  Chest x-ray 08/23/2013,  08/27/13  2-D echo 08/24/2013  CT CHEST  08/24/2013  PICC line removed 08/24/2013  PICC line placed 08/29/13  Antibiotics: Vancomycin 08/23/2013 -->08/26/13  Cefepime 08/23/2013 --> 08/27/13 Flagyl 08/24/2013 --> 08/26/13 Micafungin 08/24/2013 -->  Fluconazole 08/23/2013 --> 08/24/2013    HPI/Subjective: Patient with no complaints. Feels at baseline.  Objective: Filed Vitals:   09/01/13 1506  BP: 105/74  Pulse: 90  Temp: 97.7 F (36.5 C)  Resp: 18    Intake/Output Summary (Last 24 hours) at 09/01/13 1603 Last data filed at 09/01/13 1500  Gross per 24 hour  Intake   1560 ml  Output   1525 ml  Net     35 ml   Filed Weights   08/30/13 1328 08/31/13 0531 09/01/13 0547  Weight: 93.1 kg (205 lb 4 oz) 91.3 kg (201 lb 4.5 oz) 95.1 kg (209 lb 10.5 oz)    Exam:   General:  Laying in bed. Talking in full sentences.  Cardiovascular: RRR  Respiratory: CTAB  Abdomen: Soft/NT/ND/+BS/ Multiple drains on abdomen noted. Ileostomy, fistula and PEG noted. Pleural drain in place.  Extremities: No clubbing cyanosis or edema.  Neurological: Intact.  Data Reviewed: Basic Metabolic Panel:  Recent Labs Lab 08/27/13 1130 08/27/13 2315 08/28/13 1115 08/29/13 0325 08/30/13 0500 08/31/13 0438 09/01/13 0500  NA 148* 146* 143 141 140 138 139  K 3.2* 3.3* 3.1* 3.6 3.3* 3.7 3.8  CL 111 108 106 105 101 101 102  CO2 27 29 29 29 28 27 26   GLUCOSE 109* 110* 133* 107* 128* 146* 152*  BUN 33* 31* 30* 30* 31* 37* 42*  CREATININE 1.37* 1.30 1.13 1.14 1.18 1.26 1.16  CALCIUM 9.5 9.6 9.5 9.7 9.6 9.9 10.3  MG 1.5 2.3  --   --  2.0 2.0 1.9  PHOS 4.7* 4.1  --   --  4.5 4.9* 5.1*   Liver Function Tests:  Recent Labs Lab 08/30/13 0500 08/31/13 0438 09/01/13 0500  AST 32 30 34  ALT 38 36 36  ALKPHOS 268* 268* 274*  BILITOT 2.7* 2.7* 2.5*  PROT 8.4* 8.6* 8.7*  ALBUMIN 2.4* 2.5* 2.5*   No results found for this basename: LIPASE, AMYLASE,  in the last 168 hours No results found for this  basename: AMMONIA,  in the last 168 hours CBC:  Recent Labs Lab 08/27/13 0705 08/28/13 0325 08/29/13 0325 08/30/13 0500 08/31/13 0438  WBC 6.5 6.8 6.2 5.7 7.0  NEUTROABS 5.1  --   --  3.7 4.5  HGB 8.6* 9.4* 9.6* 9.1* 9.2*  HCT 27.0* 29.8* 30.4* 29.5* 29.8*  MCV 92.2 91.7 92.7 93.1 93.1  PLT 158 180 167 128* 120*   Cardiac Enzymes:  Recent Labs Lab 08/26/13 1010 08/26/13 1523 08/26/13 2200  TROPONINI <0.30 <0.30 <0.30   BNP (last 3 results)  Recent Labs  12/22/12 1700 08/23/13 0930 08/24/13 1148  PROBNP 33.8 9174.0* 5661.0*   CBG:  Recent Labs Lab 08/31/13 2117 09/01/13 0007 09/01/13 0421 09/01/13 0757 09/01/13 1130  GLUCAP 152* 137* 153* 155* 141*    Recent Results (from the past 240 hour(s))  CULTURE, BLOOD (ROUTINE X 2)     Status: None   Collection Time    08/23/13  9:30 AM      Result Value Range Status   Specimen Description BLOOD PICC LEFT ARM  5 ML IN [facility] BOTTLE   Final   Special Requests NONE   Final   Culture  Setup Time     Final   Value: 08/23/2013 16:16     Performed at  [facility]     Final   Value: CANDIDA ALBICANS     Note: Gram Stain Report Called to,Read Back By and Verified With: TIA JOHNSON 08/24/13 0940 BY SMITHERSJ     Performed at [facility]   Report Status PENDING   Incomplete  MRSA PCR SCREENING     Status: Abnormal   Collection Time    08/23/13 11:52 AM      Result Value Range Status   MRSA by PCR POSITIVE (*) NEGATIVE Final   Comment:            The GeneXpert MRSA Assay (FDA     approved for NASAL specimens     only), is one component of a     comprehensive MRSA colonization     surveillance program. It is not     intended to diagnose MRSA     infection nor to guide or     monitor treatment for     MRSA infections.     RESULT CALLED TO, READ BACK BY AND VERIFIED WITH:     ASHLEY,A AT 1358 ON [id number] BY POTEAT,S  URINE CULTURE     Status: None   Collection Time    08/23/13  3:27 PM       Result Value Range Status   Specimen Description URINE, RANDOM   Final   Special Requests NONE   Final   Culture  Setup Time     Final   Value: 08/23/2013 22:07     Performed at [facility] Count     Final   Value: NO GROWTH     Performed at [facility]   Culture     Final   Value: NO GROWTH     Performed at [facility]   Report Status 08/24/2013 FINAL   Final  CULTURE, BLOOD (ROUTINE X 2)     Status: None   Collection Time    08/25/13 12:35 PM      Result Value Range Status   Specimen Description BLOOD RIGHT HAND   Final   Special Requests BOTTLES DRAWN AEROBIC ONLY 4CC   Final   Culture  Setup Time     Final   Value: 08/25/2013 15:06     Performed at [facility]   Culture     Final   Value: NO GROWTH 5 DAYS     Performed at [facility]   Report Status 08/31/2013 FINAL   Final  CULTURE, BLOOD (ROUTINE X 2)     Status: None   Collection Time    08/25/13 12:35 PM      Result Value Range Status   Specimen Description BLOOD LEFT ARM   Final   Special Requests     Final   Value: BOTTLES DRAWN AEROBIC AND ANAEROBIC 10CC AER 5CC ANA   Culture  Setup Time     Final   Value: 08/25/2013 15:06     Performed at [facility]   Culture     Final   Value: NO GROWTH 5 DAYS     Performed at [facility]   Report Status 08/31/2013 FINAL   Final  CULTURE, BLOOD (ROUTINE X 2)     Status: None   Collection Time    08/26/13 10:10 AM      Result Value Range Status   Specimen Description BLOOD LEFT ARM   Final   Special  Requests     Final   Value: BOTTLES DRAWN AEROBIC AND ANAEROBIC 5CC ANA 10CC AER   Culture  Setup Time     Final   Value: 08/26/2013 13:28     Performed at [facility]   Culture     Final   Value: NO GROWTH 5 DAYS     Performed at [facility]   Report Status 09/01/2013 FINAL   Final  CULTURE, BLOOD (ROUTINE X 2)     Status: None   Collection Time    08/26/13 10:15 AM      Result  Value Range Status   Specimen Description BLOOD RIGHT ARM   Final   Special Requests BOTTLES DRAWN AEROBIC AND ANAEROBIC 4CC   Final   Culture  Setup Time     Final   Value: 08/26/2013 13:28     Performed at [facility]   Culture     Final   Value: NO GROWTH 5 DAYS     Performed at [facility]   Report Status 09/01/2013 FINAL   Final     Studies: No results found.  Scheduled Meds: . antiseptic oral rinse  15 mL Mouth Rinse q12n4p  . chlorhexidine  15 mL Mouth Rinse BID  . fentaNYL  100 mcg Transdermal Q72H  . furosemide  20 mg Intravenous Daily  . insulin aspart  0-9 Units Subcutaneous Q4H  . metoprolol  2.5 mg Intravenous Q12H  . micafungin (MYCAMINE) IV  100 mg Intravenous Daily  . pantoprazole  40 mg Intravenous q morning - 10a  . sodium chloride  10-40 mL Intracatheter Q12H   Continuous Infusions: . sodium chloride 20 mL/hr at 08/30/13 1400  . TPN (CLINIMIX) Adult without lytes     And  . fat emulsion    . Marland KitchenTPN (CLINIMIX-E) Adult 60 mL/hr at 09/01/13 0900   And  . fat emulsion    . HYDROmorphone 1 mg/hr (09/01/13 1400)    Principal Problem:   Acute respiratory failure with hypoxia Active Problems:   Abdominal malignant neoplasm   Metastatic adenocarcinoma   Peritoneal carcinomatosis   Anemia of chronic disease   Colo-enteric fistula   Severe protein-calorie malnutrition   Acute renal failure   Sepsis   Fungemia   Hypokalemia   Hypernatremia   Recurrent bacteremia   Pleural effusion    Time spent: > 40 mins    [facility]  [facility] Pager [phone]. If 7PM-7AM, please contact night-coverage at [email or web address], password [facility] 09/01/2013, 4:03 PM  LOS: 9 days

## 2013-09-01 NOTE — Progress Notes (Signed)
CARE MANAGEMENT NOTE 09/01/2013  Patient:  LIBERTY, STEAD   Account Number:  [id number]  Date Initiated:  08/24/2013  Documentation initiated by:  DAVIS,RHONDA  Subjective/Objective Assessment:   57 year old male admitted with acute respiratory failure with hypoxia, has a hx of extensive malignant carcinoma.     Action/Plan:   From home with hospice services, to return home with hospice services at d/c.   Anticipated DC Date:  09/04/2013   Anticipated DC Plan:  HOME W [facility] CARE  In-house referral  Clinical Social Worker  Hospice / Palliative Care      DC Planning Services  CM consult       Status of service:  In process, will continue to follow Medicare Important Message given?  NA - LOS <3 / Initial given by admissions  Per UR Regulation:  Reviewed for med. necessity/level of care/duration of stay  If discussed at Long Length of Stay Meetings, dates discussed:   09/01/2013   Comments:  09/01/13 Algernon Huxley RN BSN [phone] Per notes and after speaking with Hospice Liasion Rose, plan is for d.c home with Hospice services. Pt and family are interested in hiring extra help to assist with pt's care. I printed a list of private duty care giver agencies and gave it to pt. His niece Joni Reining was in the room with him. I will continue to follow and assit with d/c needs.

## 2013-09-01 NOTE — Progress Notes (Signed)
Room SL 1514-[name] - [facility] RN Visit-R.Kayvan Hoefling RN  NON COVERED ADMISSION due to private insurance.  [facility] diagnosis abdominal malignancy.  Pt is DNR code.    Pt arousable & oriented, lying in bed, without complaints of pain or discomfort this am.    No family present.  Discussed follow up GOC with Dr. Phillips Odor following her note last pm.  Will coordinate with [facility] SW - list of home health companies that can assist with home care and counseling for husband and wife.  Both admit they have strained relationship since diagnosis.  Pt's plan is to discharge home when stable.   Please call [facility] @ [phone]- ask for RN Liaison or after hours,ask for on-call RN with any hospice needs.   Thank you.  [name], RN  [facility]  Hospice Liaison  [phone])

## 2013-09-01 NOTE — Progress Notes (Signed)
Palliative Medicine Team Progress Note   S: [name] is resting peacefully. No family in room. No complaints.   Filed Vitals:   09/01/13 0547  BP: 109/76  Pulse: 89  Temp: 97.5 F (36.4 C)  Resp: 20    Assessment: 57 yo with Abdominal Malignancy-Peritoneal Carcinomatosis since 2012, who has far outlived his original prognosis-he has been to many hospitals for 2nd and 3rd opinions and has undergone numerous interventions, surgeries and chemotherapies. He has complete complete intestinal obstruction, on TNA chronically. Admitted with Fungemia after being home for 3-4 days from [facility]-will need 7 more days of Mycofungin. Will need to arrange for home IV therapy in addition to his TNA and IV Dilaudid PCA. Prognosis remains very poor. He needs ongoing goals-for now he would desire rehospitalization for reversible problems, but he does seem to be grasping more clearly his diagnosis and choices regarding level of suffering. He has new pulmonary disease with reported malignant effusion-will need Pleurx (placed at [facility]) managed by Hospice RN and drained PRN for worsening Dyspnea.  Additionally his family is in crisis and high levels of stress/caregiver burnout-appreciate Hospice CSW assisting.   Plan: 1. D/C home when medically stable and all equipment in place.  2. Hospice following.  3. Symptoms stable, no changes.   Time: 15 minutes  Anderson Malta, DO Palliative Medicine

## 2013-09-01 NOTE — Progress Notes (Signed)
PARENTERAL NUTRITION CONSULT NOTE - follow-up  Pharmacy Consult for TNA Indication: Hx Bowel Obstruction, Protein Calorie Malnutrition  Allergies  Allergen Reactions  . Percocet [Oxycodone-Acetaminophen] Nausea And Vomiting    hallucination    Patient Measurements: Height: 6\' 4"  (193 cm) Weight: 209 lb 10.5 oz (95.1 kg) IBW/kg (Calculated) : 86.8  Vital Signs: Temp: 97.5 F (36.4 C) (09/16 0547) Temp src: Oral (09/16 0547) BP: 109/76 mmHg (09/16 0547) Pulse Rate: 89 (09/16 0547) Intake/Output from previous day: 09/15 0701 - 09/16 0700 In: 1320 [I.V.:240; TPN:1080] Out: 2100 [Urine:350; Drains:1750] Intake/Output from this shift:    Labs:  Recent Labs  08/30/13 0500 08/31/13 0438  WBC 5.7 7.0  HGB 9.1* 9.2*  HCT 29.5* 29.8*  PLT 128* 120*     Recent Labs  08/30/13 0500 08/31/13 0438 09/01/13 0500  NA 140 138 139  K 3.3* 3.7 3.8  CL 101 101 102  CO2 28 27 26   GLUCOSE 128* 146* 152*  BUN 31* 37* 42*  CREATININE 1.18 1.26 1.16  CALCIUM 9.6 9.9 10.3  MG 2.0 2.0 1.9  PHOS 4.5 4.9* 5.1*  PROT 8.4* 8.6* 8.7*  ALBUMIN 2.4* 2.5* 2.5*  AST 32 30 34  ALT 38 36 36  ALKPHOS 268* 268* 274*  BILITOT 2.7* 2.7* 2.5*  PREALBUMIN 12.3*  --   --   TRIG 278* 282*  --    Estimated Creatinine Clearance: 86.3 ml/min (by C-G formula based on Cr of 1.16).    Recent Labs  09/01/13 0007 09/01/13 0421 09/01/13 0757  GLUCAP 137* 153* 155*    Medical History: Past Medical History  Diagnosis Date  . Hypertension   . GERD (gastroesophageal reflux disease)   . Small bowel obstruction 2013  . 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 Acute cholecystitis s/p perc draianage [id number] 12/20/2012  . Anemia of chronic disease 12/19/2012  . 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 MRSA bacteremia 01/04/2013  . SBO (small bowel obstruction) 11/07/2011  . Septic shock due to Staphylococcus  aureus 12/24/2012  . Severe protein-calorie malnutrition 01/17/2013  . Hospice care patient     Medications:  Scheduled:  . antiseptic oral rinse  15 mL Mouth Rinse q12n4p  . chlorhexidine  15 mL Mouth Rinse BID  . fentaNYL  100 mcg Transdermal Q72H  . furosemide  20 mg Intravenous Daily  . insulin aspart  0-9 Units Subcutaneous Q4H  . metoprolol  2.5 mg Intravenous Q12H  . micafungin (MYCAMINE) IV  100 mg Intravenous Daily  . pantoprazole  40 mg Intravenous q morning - 10a  . sodium chloride  10-40 mL Intracatheter Q12H  . sodium chloride  3 mL Intravenous Q12H   Infusions:  . sodium chloride 20 mL/hr at 08/30/13 1400  . Marland KitchenTPN (CLINIMIX-E) Adult 80 mL/hr at 08/31/13 1706   And  . fat emulsion 240 mL (08/31/13 1706)  . HYDROmorphone 1 mg/hr (08/30/13 0939)    Insulin Requirements in the past 24 hours:  6 units Novolog SSI last 24hr  Nutritional Goals:  Home TNA provided 2828 kcal/day, 132 g Protein/day, and 2400 ml/day.  Clinimix E 5/20 at 167ml/hr + IV lipids at 80ml/hr will provide 2800 kcal/day and 132g protein/day. --as of 9/16, Clinimix lyte free formula 5/15 at goal rate of 110 ml/hr + IV lipids at 10 ml/hr to provide 132 g protein/day and 2354 kcal/day  Current Nutrition:  NPO  IVF: NS at [facility]  TPN Access: PICC re-placed 9/13   Glucose - BGs remain OK  Electrolytes - Phos = 4.9 just above ULN of goal range up from 5.1 yesterday , Corr Ca elevated and rising some = 11.5 for Phos x Ca product = 59 (goal <55).  Renal - SCr WNL, I/O = -745ml/24h, - from drains. Wt increased from 201 to 209 lb  LFTs - Alk phos elevated but stable, Tbili elevated = 2.5 but stable  TGs - elevated a little but stable - 278 (9/14), 282 (9/15)  Prealbumin - 12.3 (9/14)   Assessment: 57 yo M on chronic TNA at home due to hx of bowel obstruction, EC fistula, abdominal carcinomatosis s/p extensive debulking procedures, multiple drains, and protein calorie malnutrition. Home TNA  provides 2828 kcal/day, 132 g Protein/day, and 2400 ml/day. TNA was running at 169ml/hr at home, but had been discontinued due to fungemia (candida albicans). PICC line was removed. 2-D ECHO (-) for vegetation per MD note. Repeat blood cultures from 3 days ago remain negative, therefore ok to replace PICC per ID. TNA resumed per Pharmacy consult on 9/13.  9/16: D#4 TPN - Will need to be careful with fluid from TNA due to acute CHF exacerbation on admission. Phos and Ca continue to rise so will need to remove lytes from TNA. Noted per palliative note yesterday to continue current care  Plan:   1) At 6pm tonight, change TNA from electrolyte containing formula E 5/20 to Clinimix lyte free formula 5/15 due to rising phos and calcium  Concern for increasing weight so will not increase rate of TNA further and will actually reduce TNA rate to 60 ml/hr from 88ml/hr 2) Continue NS at [facility] 3) MVI, TE daily 4) Change CBGs to q6 5) Await final word on decisions from palliative   Hessie Knows, PharmD, BCPS Pager [phone] 09/01/2013 8:38 AM

## 2013-09-01 NOTE — Progress Notes (Signed)
Patient ID: [name], male   DOB: 1956-07-04, 57 y.o.   MRN: [id number]         [facility]    Date of Admission:  08/23/2013           Day 10 micafungin         Principal Problem:   Acute respiratory failure with hypoxia Active Problems:   Fungemia   Sepsis   Recurrent bacteremia   Abdominal malignant neoplasm   Metastatic adenocarcinoma   Peritoneal carcinomatosis   Anemia of chronic disease   Colo-enteric fistula   Severe protein-calorie malnutrition   Acute renal failure   Hypokalemia   Hypernatremia   Pleural effusion   . antiseptic oral rinse  15 mL Mouth Rinse q12n4p  . chlorhexidine  15 mL Mouth Rinse BID  . fentaNYL  100 mcg Transdermal Q72H  . furosemide  20 mg Intravenous Daily  . insulin aspart  0-9 Units Subcutaneous Q4H  . metoprolol  2.5 mg Intravenous Q12H  . micafungin (MYCAMINE) IV  100 mg Intravenous Daily  . pantoprazole  40 mg Intravenous q morning - 10a  . sodium chloride  10-40 mL Intracatheter Q12H    Subjective: He is feeling better.  Objective: Temp:  [97.5 F (36.4 C)-97.9 F (36.6 C)] 97.7 F (36.5 C) (09/16 1506) Pulse Rate:  [89-90] 90 (09/16 1506) Resp:  [18-20] 18 (09/16 1506) BP: (105-110)/(74-77) 105/74 mmHg (09/16 1506) SpO2:  [99 %-100 %] 100 % (09/16 1506) Weight:  [95.1 kg (209 lb 10.5 oz)] 95.1 kg (209 lb 10.5 oz) (09/16 0547)  General: he is in good spirits watching television Skin: right arm PICC site looks good Lungs: clear Cor: regular S1 and S2 no murmurs Abdomen: soft and nontender   Lab Results Lab Results  Component Value Date   WBC 7.0 08/31/2013   HGB 9.2* 08/31/2013   HCT 29.8* 08/31/2013   MCV 93.1 08/31/2013   PLT 120* 08/31/2013    Lab Results  Component Value Date   CREATININE 1.16 09/01/2013   BUN 42* 09/01/2013   NA 139 09/01/2013   K 3.8 09/01/2013   CL 102 09/01/2013   CO2 26 09/01/2013    Lab Results  Component Value Date   ALT 36 09/01/2013   AST 34  09/01/2013   ALKPHOS 274* 09/01/2013   BILITOT 2.5* 09/01/2013      Microbiology: Recent Results (from the past 240 hour(s))  CULTURE, BLOOD (ROUTINE X 2)     Status: None   Collection Time    08/23/13  9:30 AM      Result Value Range Status   Specimen Description BLOOD PICC LEFT ARM  5 ML IN [facility] BOTTLE   Final   Special Requests NONE   Final   Culture  Setup Time     Final   Value: 08/23/2013 16:16     Performed at [facility]   Culture     Final   Value: CANDIDA ALBICANS     Note: Gram Stain Report Called to,Read Back By and Verified With: TIA JOHNSON 08/24/13 0940 BY SMITHERSJ     Performed at [facility]   Report Status PENDING   Incomplete  MRSA PCR SCREENING     Status: Abnormal   Collection Time    08/23/13 11:52 AM      Result Value Range Status   MRSA by PCR POSITIVE (*) NEGATIVE Final   Comment:  The GeneXpert MRSA Assay (FDA     approved for NASAL specimens     only), is one component of a     comprehensive MRSA colonization     surveillance program. It is not     intended to diagnose MRSA     infection nor to guide or     monitor treatment for     MRSA infections.     RESULT CALLED TO, READ BACK BY AND VERIFIED WITH:     ASHLEY,A AT 1358 ON [id number] BY POTEAT,S  URINE CULTURE     Status: None   Collection Time    08/23/13  3:27 PM      Result Value Range Status   Specimen Description URINE, RANDOM   Final   Special Requests NONE   Final   Culture  Setup Time     Final   Value: 08/23/2013 22:07     Performed at [facility] Count     Final   Value: NO GROWTH     Performed at [facility]   Culture     Final   Value: NO GROWTH     Performed at [facility]   Report Status 08/24/2013 FINAL   Final  CULTURE, BLOOD (ROUTINE X 2)     Status: None   Collection Time    08/25/13 12:35 PM      Result Value Range Status   Specimen Description BLOOD RIGHT HAND   Final   Special Requests BOTTLES DRAWN  AEROBIC ONLY 4CC   Final   Culture  Setup Time     Final   Value: 08/25/2013 15:06     Performed at [facility]   Culture     Final   Value: NO GROWTH 5 DAYS     Performed at [facility]   Report Status 08/31/2013 FINAL   Final  CULTURE, BLOOD (ROUTINE X 2)     Status: None   Collection Time    08/25/13 12:35 PM      Result Value Range Status   Specimen Description BLOOD LEFT ARM   Final   Special Requests     Final   Value: BOTTLES DRAWN AEROBIC AND ANAEROBIC 10CC AER 5CC ANA   Culture  Setup Time     Final   Value: 08/25/2013 15:06     Performed at [facility]   Culture     Final   Value: NO GROWTH 5 DAYS     Performed at [facility]   Report Status 08/31/2013 FINAL   Final  CULTURE, BLOOD (ROUTINE X 2)     Status: None   Collection Time    08/26/13 10:10 AM      Result Value Range Status   Specimen Description BLOOD LEFT ARM   Final   Special Requests     Final   Value: BOTTLES DRAWN AEROBIC AND ANAEROBIC 5CC ANA 10CC AER   Culture  Setup Time     Final   Value: 08/26/2013 13:28     Performed at [facility]   Culture     Final   Value: NO GROWTH 5 DAYS     Performed at [facility]   Report Status 09/01/2013 FINAL   Final  CULTURE, BLOOD (ROUTINE X 2)     Status: None   Collection Time    08/26/13 10:15 AM      Result Value Range  Status   Specimen Description BLOOD RIGHT ARM   Final   Special Requests BOTTLES DRAWN AEROBIC AND ANAEROBIC 4CC   Final   Culture  Setup Time     Final   Value: 08/26/2013 13:28     Performed at [facility]   Culture     Final   Value: NO GROWTH 5 DAYS     Performed at [facility]   Report Status 09/01/2013 FINAL   Final   Assessment: His repeat blood cultures are negative. He needs 7 more days of like a function therapy.  Plan: 1. Continue micafungin for one more week 2. I will sign off now please call me if I can be of further assistance  Cliffton Asters,  MD [address] [facility] [phone] pager   [phone] cell 09/01/2013, 4:32 PM

## 2013-09-02 LAB — COMPREHENSIVE METABOLIC PANEL
ALT: 38 U/L (ref 0–53)
AST: 35 U/L (ref 0–37)
Albumin: 2.5 g/dL — ABNORMAL LOW (ref 3.5–5.2)
Alkaline Phosphatase: 273 U/L — ABNORMAL HIGH (ref 39–117)
Glucose, Bld: 139 mg/dL — ABNORMAL HIGH (ref 70–99)
Potassium: 3.6 mEq/L (ref 3.5–5.1)
Sodium: 135 mEq/L (ref 135–145)
Total Protein: 8.9 g/dL — ABNORMAL HIGH (ref 6.0–8.3)

## 2013-09-02 LAB — GLUCOSE, CAPILLARY
Glucose-Capillary: 125 mg/dL — ABNORMAL HIGH (ref 70–99)
Glucose-Capillary: 127 mg/dL — ABNORMAL HIGH (ref 70–99)

## 2013-09-02 MED ORDER — SODIUM CHLORIDE 0.45 % IV SOLN: 100.0000 mL | INTRAVENOUS | Status: DC

## 2013-09-02 MED ORDER — ALBUTEROL SULFATE (5 MG/ML) 0.5% IN NEBU: 2.5000 mg | INHALATION_SOLUTION | RESPIRATORY_TRACT | Status: AC | PRN

## 2013-09-02 MED ORDER — SODIUM CHLORIDE 0.9 % IV SOLN: 100.0000 mg | Freq: Every day | INTRAVENOUS | Status: AC

## 2013-09-02 MED ORDER — PROMETHAZINE HCL 25 MG/ML IJ SOLN: 25.0000 mg | Freq: Four times a day (QID) | INTRAMUSCULAR | Status: AC | PRN

## 2013-09-02 MED ORDER — FENTANYL 100 MCG/HR TD PT72: 1.0000 | MEDICATED_PATCH | TRANSDERMAL | Status: AC

## 2013-09-02 MED ORDER — IPRATROPIUM BROMIDE 0.02 % IN SOLN: 0.5000 mg | Freq: Four times a day (QID) | RESPIRATORY_TRACT | Status: AC | PRN

## 2013-09-02 MED ORDER — SODIUM CHLORIDE 0.9 % IV SOLN: 10.0000 mL | INTRAVENOUS | Status: AC

## 2013-09-02 MED ORDER — INSULIN ASPART 100 UNIT/ML ~~LOC~~ SOLN: 0.0000 [IU] | SUBCUTANEOUS | Status: AC

## 2013-09-02 MED ORDER — SODIUM CHLORIDE 0.45 % IV SOLN: INTRAVENOUS | Status: DC

## 2013-09-02 MED ORDER — SODIUM CHLORIDE 0.45 % IV SOLN: 50.0000 mL | INTRAVENOUS | Status: DC

## 2013-09-02 MED ORDER — SODIUM CHLORIDE 0.45 % IV SOLN: INTRAVENOUS | Status: AC

## 2013-09-02 MED ORDER — SODIUM CHLORIDE 0.9 % IV SOLN: 1.0000 mg/h | INTRAVENOUS | Status: AC

## 2013-09-02 MED ORDER — HYDROMORPHONE BOLUS VIA INFUSION: 1.0000 mg | INTRAVENOUS | Status: AC | PRN

## 2013-09-02 MED ORDER — BIOTENE DRY MOUTH MT LIQD: 15.0000 mL | Freq: Two times a day (BID) | OROMUCOSAL | Status: AC

## 2013-09-02 NOTE — Progress Notes (Addendum)
Dilaudid gtt discontinued and dilaudid CADD pump connected to patient by Jeri Modena, RN ([facility]) for discharge home. 47mL dilaudid gtt wasted in sink with second RN, Larey Days, RN   Agree with above. Angela Adam, RN.

## 2013-09-02 NOTE — Progress Notes (Signed)
Room [facility] 1514 - Cecile Hearing -[facility] RN Visit-R.Nyomi Howser RN  NON-COVERED ADMISSION due to private insurance.   [facility] diagnosis of abdominal malignancy.  Pt is DNR code.    Pt alert & oriented, lying upright in bed, with complaints of pain at 4-5/10 and nausea. Staff RN Belenda Cruise medicated pt during visit.  Dr. Mahala Menghini present during visit.  Son - Primary care giver present.  Pt going home on multiple IV meds-continuous dilaudid, TPN, Anti-fungal and IVF.  [facility] RN Pam aware of discharge, Pharmacy contacted for TPN set up.  Discussed home care with son - who states he is calling an agency for PCG support during the mornings when he is not available and patient's siblings may assist with care in the afternoons.  Discussed counseling through [facility] for pt and wife - pt agreed to a session.  [facility] SW to discuss further with patient.  Advised pt and son this will be a slow process to get all Meds/pumps in place.  Answered questions and will follow up this afternoon when pumps available for set up at discharge.   Please call [facility] @ [phone]- ask for RN Liaison or after hours,ask for on-call RN with any hospice needs.   Thank you.  [name], RN  [facility]  Hospice Liaison  [phone])

## 2013-09-02 NOTE — Progress Notes (Signed)
Clinical Social Work  Per chart review, patient to return home with family support. CSW is signing off but available if further needs arise.   [place], [place] [phone]

## 2013-09-02 NOTE — Progress Notes (Addendum)
Clinical Social Work  Charity fundraiser reports that patient needs assistance with transportation home. CSW spoke with hospice RN Okey Dupre) who reports that equipment needs to be delivered to house and will call CSW once they are ready for patient to return home. CSW updated MD and RN on DC plans. CSW will continue to follow.  [place], [place] [phone]  1630-[facility] working with patient and will inform RN when patient is ready to DC. CSW completed [facility] paperwork and RN to call once ready for patient to DC.  [place], [place] [phone]

## 2013-09-02 NOTE — Progress Notes (Signed)
Patient discharged home per MD order. Discharge papers reviewed with patient and son at bedside. Patient's son, Scherrie November signed the D/C papers for patient. [facility] arrived to transport home. [facility] called @ [phone] and notified that patient had left the hospital so [facility] RN could meet patient at home. Angelena Form, RN

## 2013-09-02 NOTE — Progress Notes (Signed)
NUTRITION FOLLOW UP  Intervention:   TPN per pharmacy RD to follow for nutrition care plan  Nutrition Dx:   Inadequate oral intake related to inability to eat as evidenced by NPO status - no longer appropriate, diet advanced to full liquids - no new nutrition dx as pt being d/c  Goal:   Pt to meet >/= 90% of their estimated nutrition needs - met as closely as possible with premixed TPN formula   Assessment:   Pt with metastatic adenocarcinoma/peritoneal carcinomatosis status post extensive debulking procedures, on chronic TPN via PICC line, chronic PEG for gastric decompression/history of small bowel obstruction status post jejunal stent/ileostomy/cholecystitis status post cholecystostomy/chronic indwelling cath.    Pt's weight down 24 pounds since admission. Weight went down as low as 193 pounds on 08/29/13, has been increasing and now at 208 pounds. Noted pt with +1 generalized edema, +2 RLE, LLE edema. Pt with 1.6L output from PEG tube total yesterday, down from 1.75 L on 08/31/13. Pt with nausea this morning, RN aware.   Received page from pharmD to discuss home TPN.   Current TPN: Clinimix lyte free formula 5/15 at goal rate of 110 ml/hr + IV lipids at 10 ml/hr to provide 132 g protein/day and 2354 kcal/day which meets 87% estimated calorie needs, 101% estimated protein needs  - CBGs < 150 mg/dL - BUN slightly elevated - Alk phos elevated but trending down slightly from earlier this week - Total bilirubin elevated but stable - PALB low  - Triglycerides elevated and trending up slightly   Height: Ht Readings from Last 1 Encounters:  08/23/13 6\' 4"  (1.93 m)    Weight Status:   Wt Readings from Last 1 Encounters:  09/02/13 208 lb 15.9 oz (94.8 kg)    Re-estimated needs:  Kcal: 2700-3000 Protein: 130-140 g/day Fluid: 2.7-3.0 L  Skin: +1 generalized edema, +2 RLE, LLE edema, wound on buttocks, excoriated right buttocks  Diet Order: Full Liquid   Intake/Output Summary  (Last 24 hours) at 09/02/13 1108 Last data filed at 09/02/13 0900  Gross per 24 hour  Intake   1320 ml  Output   2775 ml  Net  -1455 ml    Last BM: ostomy   Labs:   Recent Labs Lab 08/30/13 0500 08/31/13 0438 09/01/13 0500 09/02/13 0830  NA 140 138 139 135  K 3.3* 3.7 3.8 3.6  CL 101 101 102 100  CO2 28 27 26 26   BUN 31* 37* 42* 43*  CREATININE 1.18 1.26 1.16 1.16  CALCIUM 9.6 9.9 10.3 10.6*  MG 2.0 2.0 1.9  --   PHOS 4.5 4.9* 5.1* 4.5  GLUCOSE 128* 146* 152* 139*    CBG (last 3)   Recent Labs  09/02/13 0351 09/02/13 0722 09/02/13 1102  GLUCAP 125* 127* 135*    Scheduled Meds: . antiseptic oral rinse  15 mL Mouth Rinse q12n4p  . chlorhexidine  15 mL Mouth Rinse BID  . fentaNYL  100 mcg Transdermal Q72H  . furosemide  20 mg Intravenous Daily  . insulin aspart  0-9 Units Subcutaneous Q4H  . metoprolol  2.5 mg Intravenous Q12H  . micafungin (MYCAMINE) IV  100 mg Intravenous Daily  . pantoprazole  40 mg Intravenous q morning - 10a  . sodium chloride  10-40 mL Intracatheter Q12H    Continuous Infusions: . sodium chloride 20 mL/hr at 09/01/13 2222  . TPN (CLINIMIX) Adult without lytes 60 mL/hr at 09/01/13 1815   And  . fat emulsion 240 mL (  09/01/13 1815)  . HYDROmorphone 1 mg/hr (09/01/13 1400)    Levon Hedger MS, RD, LDN [phone] Pager [phone] After Hours Pager

## 2013-09-02 NOTE — Progress Notes (Signed)
OT Cancellation Note  Patient Details Name: [name] MRN: [id number] DOB: 05/13/1956   Cancelled Treatment:    Reason Eval/Treat Not Completed: Fatigue/lethargy limiting ability to participate.  [name] 09/02/2013, 12:31 PM

## 2013-09-02 NOTE — Discharge Summary (Addendum)
Physician Discharge Summary  [name] ZOX:[id number] DOB: 28-Nov-1956 DOA: 08/23/2013  PCP: Erskine Emery, NP  Admit date: 08/23/2013 Discharge date: 09/02/2013  Time spent: 45 minutes  Recommendations for Outpatient Follow-up:  1. Patient needs to followup with primary care physician /nurse practitioner regarding home hospice needs  2. Patient continue chronic prior medications including Dilaudid continuous infusion with pump as well as when necessary Dilaudid bolus 3. Stop date micafungin 09/08/13 4. To continue chronic TPN 5. To continue drains 6. Reconsider beta blocker or ACE inhibitor in the near future if labs seem appropriate  Discharge Diagnoses:  Principal Problem:   Acute respiratory failure with hypoxia Active Problems:   Abdominal malignant neoplasm   Metastatic adenocarcinoma   Peritoneal carcinomatosis   Anemia of chronic disease   Colo-enteric fistula   Severe protein-calorie malnutrition   Acute renal failure   Sepsis   Fungemia   Hypokalemia   Hypernatremia   Recurrent bacteremia   Pleural effusion   Discharge Condition: Guarded-very poor overall long-term prognosis  Diet recommendation: TPN, by mouth ad lib. if can tolerate  Filed Weights   08/31/13 0531 09/01/13 0547 09/02/13 0500  Weight: 91.3 kg (201 lb 4.5 oz) 95.1 kg (209 lb 10.5 oz) 94.8 kg (208 lb 15.9 oz)    History of present illness:  57 year old African American male, known metastatic adenocarcinoma, or near carcinomatosis status post extensive debulking surgeries by Dr. [name], on chronic TPN, chronic PEG tube, history of SBO, multiple other events admitted 08/23/13 with acute respiratory failure.  In ED, pt found to be hypoxic with O2 saturation in 80's on RA that has quickly improved with oxygen via [place] to low 90's, Tmax 104 F in ED, and CXR findings were suggestive of possible multilobar PNA with ? Vascular congestion. TRH asked to admit for further evaluation and management  in SDU   Hospital Course:  Assessment/Plan:  #1 acute hypoxic respiratory distress  Likely secondary to acute CHF exacerbation/pulmonary edema. Cannot exclude metastatic disease. Pro BNP was elevated at 5661 on 9/8 /2014. Pro calcitonin level is slowly trending down. Cardiac enzymes negative x3. 2-D echo with EF of 30-35% with systolic function moderately to severely reduced. No vegetations noted. I/0. equal -18.4L since admission; weight 3 days ago was 87.6kg from 89.5kg from 95.4 kg from 105.1 kg(08/25/13) from 105.6kg (08/23/13).  Lasix gtt has been changed to lasix 20mg  IV Daily.  beta blocker was discontinued during this hospital admission given he had moderate hypotension  #2 severe sepsis  Likely secondary to fungemia secondary to chronic PICC line. Doubt if PNA as patient improved rapidly with diureses. PICC removed 08/24/13. Urine Legionella antigen negative. Urine pneumococcus antigen negative. Blood cultures positive for fungemia growing Candida albicans. Urine cultures negative. Pro calcitonin level slowly trending down at 3.28 (08/26/13) from 5.95. Repeat blood cultures pending with no growth to date. Continue IV micafungin for another couple of days until 09/08/2013 . 2-D echo with no vegetations noted. IV vancomycin and flagyl and cefepime  were empirically started and then they were as per ID,  Dr. Cliffton Asters  #3 acute systolic CHF exacerbation/ nonischemic cardiomyopathy  Questionable etiology. CM maybe secondary to sepsis. Pro BNP was elevated at 5661 on 08/24/2013. 2-D echo with a depressed EF of 30-35% with moderately to severely reduced systolic function. No vegetations noted. Cardiac enzymes negative x3. I/O = -18,471.2cc since admission. Current weight is 90.6kg from 87.6 kg from 89.5kg from 95.4 kg from 105.6 kg on admission 08/23/2013. Patient was  placed on a Lasix drip which will be discontinued and changed to IV lasix daily. . And if patient remained hemodynamically stable may  consider adding low-dose ACE inhibitor. Patient with a complex medical history and currently not a candidate for invasive procedures at this point in time. Will treat medically.  #4 metastatic adenocarcinoma/peritoneal carcinomatosis status post extensive debulking procedures no abdominal surgeries, multiple drains and fistulas on chronic TPN via PICC line, chronic PEG for gastric decompression/history of small bowel obstruction status post jejunal stent/ileostomy/cholecystitis status post cholecystostomy/chronic indwelling cath  Patient is currently n.p.o. TPN was held secondary to fungemia. PICC line reinserted. Patient has not had chemotherapy and greater than 9 months. Patient on home hospice however is full code. Resumed TPN.  Palliative care meeting held yesterday for goals of care-he is currently a DO NOT RESUSCITATE and further discussions will need to occur in the outpatient setting with his primary care physician, his surgeon and the whole care team . He'll be discharged under the care of Dr. Della Goo and Marletta Lor nurse practitioner.  He has an exceedingly guarded guarded  #5 anemia of chronic disease  Stable. Follow H&H. Transfusion threshold hemoglobin less than 7.  #6 fungemia  Likely secondary to chronic PICC line. PICC line has been removed on 08/24/2013. 2-D echo with a depressed EF of 30-35% with no vegetations noted. Continue empiric IV micafungin. Will need minimum of 2 weeks per ID through Sept 23 2014. ID ff and appreciate input and rxcs.  #7 severe protein calorie malnutrition  Secondary to problem #4. TPN was held  initially secondary to severe sepsis. Repeat blood cultures are negative to date. PICC line placed. TPN started.  #8 oral thrush  On micafungin.   Consultants:  PCCM: Dr. Tyson Alias 08/24/2013  Infectious disease Dr. Orvan Falconer 08/26/13  Palliative Care: Dr Phillips Odor 08/30/13 Procedures:  Chest x-ray 08/23/2013, 08/27/13  2-D echo 08/24/2013  CT CHEST  08/24/2013  PICC line removed 08/24/2013  PICC line placed 08/29/13 Antibiotics:  Vancomycin 08/23/2013 -->08/26/13  Cefepime 08/23/2013 --> 08/27/13  Flagyl 08/24/2013 --> 08/26/13  Micafungin 08/24/2013 --> 09/08/13 Fluconazole 08/23/2013 --> 08/24/2013    Discharge Exam: Filed Vitals:   09/02/13 0500  BP: 112/80  Pulse: 88  Temp: 97.9 F (36.6 C)  Resp: 18    alert pleasant oriented laughing and joking in the room with his son. Denies pain nausea vomiting blurred vision double vision  General:  EOMI, NCAT Cardiovascular:  S1-S2 no murmur rub or gallop Respiratory:  mild crackles was posteriorly  Discharge Instructions  Discharge Orders   Future Orders Complete By Expires   Discharge instructions  As directed    Comments:     Marletta Lor NP will be Hospice provider with [facility] home [facility] visits under Dr. Della Goo     You were cared for by a hospitalist during your hospital stay. If you have any questions about your discharge medications or the care you received while you were in the hospital after you are discharged, you can call the unit and asked to speak with the hospitalist on call if the hospitalist that took care of you is not available. Once you are discharged, your primary care physician will handle any further medical issues. Please note that NO REFILLS for any discharge medications will be authorized once you are discharged, as it is imperative that you return to your primary care physician (or establish a relationship with a primary care physician if you do not have one) for  your aftercare needs so that they can reassess your need for medications and monitor your lab values. If you do not have a primary care physician, you can call [phone] for a physician referral.       Medication List    STOP taking these medications       sodium chloride 0.45 % solution  Replaced by:  sodium chloride 0.9 % infusion      TAKE these medications       albuterol (5  MG/ML) 0.5% nebulizer solution  Commonly known as:  PROVENTIL  Take 0.5 mLs (2.5 mg total) by nebulization every 2 (two) hours as needed for wheezing or shortness of breath.     antiseptic oral rinse Liqd  15 mLs by Mouth Rinse route 2 times daily at 12 noon and 4 pm.     fat emulsion 20 % infusion  Inject 250 mLs into the vein daily. Intralipid Excel Container 50gm . Off at 8am and on at 8pm.     fentaNYL 100 MCG/HR  Commonly known as:  DURAGESIC - dosed mcg/hr  Place 1 patch (100 mcg total) onto the skin every 3 (three) days.     HYDROmorphone 2 mg/mL Soln  Commonly known as:  DILAUDID  Inject 1 mg into the vein every hour as needed.     insulin aspart 100 UNIT/ML injection  Commonly known as:  novoLOG  Inject 0-9 Units into the skin every 4 (four) hours.     ipratropium 0.02 % nebulizer solution  Commonly known as:  ATROVENT  Take 2.5 mLs (0.5 mg total) by nebulization every 6 (six) hours as needed.     ondansetron 4 MG/2ML Soln injection  Commonly known as:  ZOFRAN  Inject 4 mg into the vein every 6 (six) hours as needed for nausea. Inject IV - Push every 6 hours as needed     pantoprazole 40 MG injection  Commonly known as:  PROTONIX  Inject 40 mg into the vein every morning. Infuse 40ml over 60 minutes     PCA Injector Misc  - Inject into the vein See admin instructions. Hydromorphone HCL SDV 10mg /29ml vial. dilauded PCA: final concentration 1mg /ml:1mg /hr basal rate  - May have 1mg  bolus every 1 hour as needed for pain     promethazine 25 MG/ML injection  Commonly known as:  PHENERGAN  Inject 1 mL (25 mg total) into the vein every 6 (six) hours as needed for nausea or vomiting.     sodium chloride 0.9 % infusion  Inject 10 mLs into the vein continuous.     sodium chloride 0.45 % solution  Inject 50 mLs into the vein continuous. Sodium chloride 1000 n/s. Infuse 0.45% NS intravenously at 142ml/hr daily     sodium chloride 0.9 % SOLN 100 mL with micafungin  50 MG SOLR 100 mg  Inject 100 mg into the vein daily.     sodium chloride 0.9 % SOLN 80 mL with HYDROmorphone 10 MG/ML SOLN 200 mg  Inject 1 mg/hr into the vein continuous.     TPN ADULT  - Inject into the vein continuous. infuvite 200-150/10 vial. Add 10 MV to TPN just prior to hanging every day. Infuse over 20 hours. Start at 1800-1900.  - [facility] [phone]       Allergies  Allergen Reactions  . Percocet [Oxycodone-Acetaminophen] Nausea And Vomiting    hallucination      The results of significant diagnostics from this hospitalization (including imaging, microbiology, ancillary  and laboratory) are listed below for reference.    Significant Diagnostic Studies: Ct Chest Wo Contrast  08/24/2013   *RADIOLOGY REPORT*  Clinical Data: Evaluate for infiltrates.  CT CHEST WITHOUT CONTRAST  Technique:  Multidetector CT imaging of the chest was performed following the standard protocol without IV contrast.  Comparison: Chest CT 11/09/2011.  Findings:  Mediastinum: Heart size is mildly enlarged. Trace amount of pericardial fluid and/or thickening, unlikely to be of any hemodynamic significance at this time.  No associated pericardial calcification.  Mild atherosclerosis of the thoracic aorta. Dilatation of the pulmonic trunk (4.3 cm in diameter), suggestive of elevated pulmonary artery pressures. No pathologically enlarged mediastinal or hilar lymph nodes. Please note that accurate exclusion of hilar adenopathy is limited on noncontrast CT scans. Esophagus is unremarkable in appearance.  Lungs/Pleura: Patchy areas of ground-glass attenuation with extensive interlobular septal thickening and profound thickening of the peribronchovascular interstitium, favored to represent pulmonary edema.  Small to moderate bilateral pleural effusions, both of which are partially loculated with fluid in the major fissures bilaterally.  A small-bore right-sided chest tube extends into the posterior aspect of  the right pleural space.  Upper Abdomen: An open anterior abdominal wound is incompletely visualized.  Trace volume of ascites.  Tubing is present within the stomach, but incompletely visualized.  This is not a nasogastric tube.  Musculoskeletal: There are no aggressive appearing lytic or blastic lesions noted in the visualized portions of the skeleton.  IMPRESSION: 1.  Multifocal interstitial and airspace disease throughout the lungs bilaterally, favored to predominately reflect underlying pulmonary edema.  Given the cardiomegaly and bilateral pleural effusions, findings are favored to reflect congestive heart failure.  Superimposed infection with multilobar pneumonia is difficult to entirely exclude, particularly in light of the partially loculated pleural effusions bilaterally.  Clinical correlation is recommended. 2.  Support apparatus and postoperative changes, as above.   Original Report Authenticated By: Trudie Reed, M.D.   Dg Chest Port 1 View  08/29/2013   CLINICAL DATA:  57 year old male PICC line placement.  EXAM: PORTABLE CHEST - 1 VIEW  COMPARISON:  0943 hr the same day and earlier.  FINDINGS: Portable AP semi upright view at 1033 hrs. Interval adjustment of the right PICC line, tip now at the level of the cavoatrial junction. Otherwise stable chest.  IMPRESSION: PICC line withdrawn, tip now at the level of the cavoatrial junction.   Electronically Signed   By: Augusto Gamble M.D.   On: 08/29/2013 10:55   Dg Chest Port 1 View  08/29/2013   *RADIOLOGY REPORT*  Clinical Data: Evaluate PICC line placement  PORTABLE CHEST - 1 VIEW  Comparison: 08/27/2013  Findings: Right PICC line has been placed with tip over right atrium.  No pneumothorax.  Mild bibasilar atelectasis with mildly improved aeration on the right.  IMPRESSION: PICC line as described.   Original Report Authenticated By: Esperanza Heir, M.D.   Dg Chest Port 1 View  08/27/2013   *RADIOLOGY REPORT*  Clinical Data: Follow up pleural  effusions and CHF  PORTABLE CHEST - 1 VIEW  Comparison: Portable chest x-ray of 08/23/2013 and CT chest of 08/24/2013  Findings: There has been some improvement in the pulmonary vascular congestion and mild edema pattern.  Mild cardiomegaly is stable and there may be tiny effusions remaining.  IMPRESSION: Improvement in mild CHF with small effusions.   Original Report Authenticated By: Dwyane Dee, M.D.   Dg Chest Port 1 View  08/23/2013   *RADIOLOGY REPORT*  Clinical Data: Shortness  of breath.  Fever.  PORTABLE CHEST - 1 VIEW  Comparison: Chest x-ray 04/03/2013.  Findings: Lung volumes are low.  Bibasilar opacities may reflect areas of atelectasis and/or consolidation.  Probable small bilateral pleural effusions.  Right-sided chest tube in place with tip and sideport projecting over the right hemithorax. There is a left upper extremity PICC with tip terminating in the proximal superior vena cava. Marked enlargement of the cardiopericardial silhouette. The patient is rotated to the right on today's exam, resulting in distortion of the mediastinal contours and reduced diagnostic sensitivity and specificity for mediastinal pathology. Cephalization of the pulmonary vasculature with indistinctness of the interstitial markings which may suggest mild interstitial pulmonary edema.  IMPRESSION: 1.  Support apparatus, as above. 2.  The appearance of the chest suggests mild congestive heart failure, however, multilobar pneumonia is difficult to exclude. Clinical correlation is recommended. 3.  Small bilateral pleural effusions.   Original Report Authenticated By: Trudie Reed, M.D.    Microbiology: Recent Results (from the past 240 hour(s))  MRSA PCR SCREENING     Status: Abnormal   Collection Time    08/23/13 11:52 AM      Result Value Range Status   MRSA by PCR POSITIVE (*) NEGATIVE Final   Comment:            The GeneXpert MRSA Assay (FDA     approved for NASAL specimens     only), is one component of a      comprehensive MRSA colonization     surveillance program. It is not     intended to diagnose MRSA     infection nor to guide or     monitor treatment for     MRSA infections.     RESULT CALLED TO, READ BACK BY AND VERIFIED WITH:     ASHLEY,A AT 1358 ON [id number] BY POTEAT,S  URINE CULTURE     Status: None   Collection Time    08/23/13  3:27 PM      Result Value Range Status   Specimen Description URINE, RANDOM   Final   Special Requests NONE   Final   Culture  Setup Time     Final   Value: 08/23/2013 22:07     Performed at [facility] Count     Final   Value: NO GROWTH     Performed at [facility]   Culture     Final   Value: NO GROWTH     Performed at [facility]   Report Status 08/24/2013 FINAL   Final  CULTURE, BLOOD (ROUTINE X 2)     Status: None   Collection Time    08/25/13 12:35 PM      Result Value Range Status   Specimen Description BLOOD RIGHT HAND   Final   Special Requests BOTTLES DRAWN AEROBIC ONLY 4CC   Final   Culture  Setup Time     Final   Value: 08/25/2013 15:06     Performed at [facility]   Culture     Final   Value: NO GROWTH 5 DAYS     Performed at [facility]   Report Status 08/31/2013 FINAL   Final  CULTURE, BLOOD (ROUTINE X 2)     Status: None   Collection Time    08/25/13 12:35 PM      Result Value Range Status   Specimen Description BLOOD LEFT ARM   Final  Special Requests     Final   Value: BOTTLES DRAWN AEROBIC AND ANAEROBIC 10CC AER 5CC ANA   Culture  Setup Time     Final   Value: 08/25/2013 15:06     Performed at [facility]   Culture     Final   Value: NO GROWTH 5 DAYS     Performed at [facility]   Report Status 08/31/2013 FINAL   Final  CULTURE, BLOOD (ROUTINE X 2)     Status: None   Collection Time    08/26/13 10:10 AM      Result Value Range Status   Specimen Description BLOOD LEFT ARM   Final   Special Requests     Final   Value: BOTTLES DRAWN AEROBIC  AND ANAEROBIC 5CC ANA 10CC AER   Culture  Setup Time     Final   Value: 08/26/2013 13:28     Performed at [facility]   Culture     Final   Value: NO GROWTH 5 DAYS     Performed at [facility]   Report Status 09/01/2013 FINAL   Final  CULTURE, BLOOD (ROUTINE X 2)     Status: None   Collection Time    08/26/13 10:15 AM      Result Value Range Status   Specimen Description BLOOD RIGHT ARM   Final   Special Requests BOTTLES DRAWN AEROBIC AND ANAEROBIC 4CC   Final   Culture  Setup Time     Final   Value: 08/26/2013 13:28     Performed at [facility]   Culture     Final   Value: NO GROWTH 5 DAYS     Performed at [facility]   Report Status 09/01/2013 FINAL   Final     Labs: Basic Metabolic Panel:  Recent Labs Lab 08/27/13 1130 08/27/13 2315  08/29/13 0325 08/30/13 0500 08/31/13 0438 09/01/13 0500 09/02/13 0830  NA 148* 146*  < > 141 140 138 139 135  K 3.2* 3.3*  < > 3.6 3.3* 3.7 3.8 3.6  CL 111 108  < > 105 101 101 102 100  CO2 27 29  < > 29 28 27 26 26   GLUCOSE 109* 110*  < > 107* 128* 146* 152* 139*  BUN 33* 31*  < > 30* 31* 37* 42* 43*  CREATININE 1.37* 1.30  < > 1.14 1.18 1.26 1.16 1.16  CALCIUM 9.5 9.6  < > 9.7 9.6 9.9 10.3 10.6*  MG 1.5 2.3  --   --  2.0 2.0 1.9  --   PHOS 4.7* 4.1  --   --  4.5 4.9* 5.1* 4.5  < > = values in this interval not displayed. Liver Function Tests:  Recent Labs Lab 08/30/13 0500 08/31/13 0438 09/01/13 0500 09/02/13 0830  AST 32 30 34 35  ALT 38 36 36 38  ALKPHOS 268* 268* 274* 273*  BILITOT 2.7* 2.7* 2.5* 2.7*  PROT 8.4* 8.6* 8.7* 8.9*  ALBUMIN 2.4* 2.5* 2.5* 2.5*   No results found for this basename: LIPASE, AMYLASE,  in the last 168 hours No results found for this basename: AMMONIA,  in the last 168 hours CBC:  Recent Labs Lab 08/27/13 0705 08/28/13 0325 08/29/13 0325 08/30/13 0500 08/31/13 0438  WBC 6.5 6.8 6.2 5.7 7.0  NEUTROABS 5.1  --   --  3.7 4.5  HGB 8.6* 9.4* 9.6* 9.1*  9.2*  HCT 27.0* 29.8* 30.4* 29.5*  29.8*  MCV 92.2 91.7 92.7 93.1 93.1  PLT 158 180 167 128* 120*   Cardiac Enzymes:  Recent Labs Lab 08/26/13 1523 08/26/13 2200  TROPONINI <0.30 <0.30   BNP: BNP (last 3 results)  Recent Labs  12/22/12 1700 08/23/13 0930 08/24/13 1148  PROBNP 33.8 9174.0* 5661.0*   CBG:  Recent Labs Lab 09/01/13 1957 09/01/13 2239 09/02/13 0150 09/02/13 0351 09/02/13 0722  GLUCAP 119* 104* 117* 125* 127*       Signed:  Rhetta Mura  [facility] 09/02/2013, 10:10 AM

## 2013-09-02 NOTE — Progress Notes (Signed)
PARENTERAL NUTRITION CONSULT NOTE - follow-up  Pharmacy Consult for TNA Indication: Hx Bowel Obstruction, Protein Calorie Malnutrition  Allergies  Allergen Reactions  . Percocet [Oxycodone-Acetaminophen] Nausea And Vomiting    hallucination    Patient Measurements: Height: 6\' 4"  (193 cm) Weight: 208 lb 15.9 oz (94.8 kg) IBW/kg (Calculated) : 86.8  Vital Signs: Temp: 97.9 F (36.6 C) (09/17 0500) Temp src: Oral (09/17 0500) BP: 112/80 mmHg (09/17 0500) Pulse Rate: 88 (09/17 0500) Intake/Output from previous day: 09/16 0701 - 09/17 0700 In: 1320 [P.O.:240; I.V.:240; TPN:840] Out: 2375 [Urine:725; Drains:1650] Intake/Output from this shift:    Labs:  Recent Labs  08/31/13 0438  WBC 7.0  HGB 9.2*  HCT 29.8*  PLT 120*     Recent Labs  08/31/13 0438 09/01/13 0500 09/02/13 0830  NA 138 139 135  K 3.7 3.8 3.6  CL 101 102 100  CO2 27 26 26   GLUCOSE 146* 152* 139*  BUN 37* 42* 43*  CREATININE 1.26 1.16 1.16  CALCIUM 9.9 10.3 10.6*  MG 2.0 1.9  --   PHOS 4.9* 5.1* 4.5  PROT 8.6* 8.7* 8.9*  ALBUMIN 2.5* 2.5* 2.5*  AST 30 34 35  ALT 36 36 38  ALKPHOS 268* 274* 273*  BILITOT 2.7* 2.5* 2.7*  TRIG 282*  --   --    Estimated Creatinine Clearance: 86.3 ml/min (by C-G formula based on Cr of 1.16).    Recent Labs  09/02/13 0150 09/02/13 0351 09/02/13 0722  GLUCAP 117* 125* 127*    Medical History: Past Medical History  Diagnosis Date  . Hypertension   . GERD (gastroesophageal reflux disease)   . Small bowel obstruction 2013  . 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 Acute cholecystitis s/p perc draianage [id number] 12/20/2012  . Anemia of chronic disease 12/19/2012  . 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 MRSA bacteremia 01/04/2013  . SBO (small bowel obstruction) 11/07/2011  . Septic shock due to Staphylococcus aureus 12/24/2012  . Severe protein-calorie malnutrition  01/17/2013  . Hospice care patient     Medications:  Scheduled:  . antiseptic oral rinse  15 mL Mouth Rinse q12n4p  . chlorhexidine  15 mL Mouth Rinse BID  . fentaNYL  100 mcg Transdermal Q72H  . furosemide  20 mg Intravenous Daily  . insulin aspart  0-9 Units Subcutaneous Q4H  . metoprolol  2.5 mg Intravenous Q12H  . micafungin (MYCAMINE) IV  100 mg Intravenous Daily  . pantoprazole  40 mg Intravenous q morning - 10a  . sodium chloride  10-40 mL Intracatheter Q12H   Infusions:  . sodium chloride 20 mL/hr at 09/01/13 2222  . TPN (CLINIMIX) Adult without lytes 60 mL/hr at 09/01/13 1815   And  . fat emulsion 240 mL (09/01/13 1815)  . HYDROmorphone 1 mg/hr (09/01/13 1400)    Insulin Requirements in the past 24 hours:  6 units Novolog SSI last 24hr  Nutritional Goals:  Home TNA provided 2828 kcal/day, 132 g Protein/day, and 2400 ml/day.  Clinimix E 5/20 at 141ml/hr + IV lipids at 75ml/hr will provide 2800 kcal/day and 132g protein/day. --as of 9/16, Clinimix lyte free formula 5/15 at goal rate of 110 ml/hr + IV lipids at 10 ml/hr to provide 132 g protein/day and 2354 kcal/day  Re-estimated needs (per RD 9/17):  Kcal: 2700-3000  Protein: 130-140 g/day  Fluid: 2.7-3.0 L  Current Nutrition:  NPO  IVF: NS at [facility]  TPN Access: PICC re-placed 9/13   Glucose - BGs remain OK  Electrolytes - Phos = 4.5, improved after removing electrolytes from TNA.  Corr Ca remains elevated and increasing, now 12.1, most likely from metastatic disease.  Phos x Ca product = 54.45 (goal <55).  Renal - SCr WNL, I/O = -1048ml/24h, - from drains. Wt up to 208 lb (stable from yesterday, but has fluctucated greatly during this admission, usual weight documented as 170-180 lbs early this year).  LFTs - Alk phos elevated but stable, Tbili elevated = 2.7 but relatively stable  TGs - elevated a little but stable - 278 (9/14), 282 (9/15)  Prealbumin - 12.3 (9/14)   Assessment: 57 yo M on  chronic TNA at home due to hx of bowel obstruction, EC fistula, abdominal carcinomatosis s/p extensive debulking procedures, multiple drains, and protein calorie malnutrition. Home TNA provides 2828 kcal/day, 132 g Protein/day, and 2400 ml/day. TNA was running at 140ml/hr at home, but had been discontinued due to fungemia (candida albicans). PICC line was removed. 2-D ECHO (-) for vegetation per MD note. Repeat blood cultures from 3 days ago remain negative, therefore ok to replace PICC per ID. TNA resumed per Pharmacy consult on 9/13.  9/17: D#5 TPN - Will need to be careful with fluid from TNA due to acute CHF exacerbation on admission. Phos decreased to WNL after removing lytes from TNA.  Noted per palliative note yesterday to continue current care.  Planning discharge to hospice today.  Plan:  For discharge, would continue electrolyte-free Clinimix for now to ensure that phosphorus continues to trend down.  Goal Phos x Calcium product < 55.  Also have been cautious about advancing back up to goal TNA rate considering possible fluid overload on admission.  Drain output remains high with I/O net negative daily; however, weight remains elevated so continuing lower TNA rate (60 ml/hr at discharge) and advance as tolerated per [facility].  Today, continue Clinimix lyte free formula 5/15 at 60 ml/hr with lipids at 10 ml/hr with plans to advance to PTA TNA formulation and rate as tolerated.  Home TNA provided 2828 kcal/day, 132 g Protein/day, and 2400 ml/day which matches nutritional goals per RD assessment today.  Watch weight and I/O to avoid fluid overload as increase TNA rate.   Clance Boll, PharmD, BCPS Pager: [phone] 09/02/2013 10:20 AM

## 2013-09-10 LAB — CULTURE, BLOOD (ROUTINE X 2)

## 2013-09-12 ENCOUNTER — Encounter (HOSPITAL_COMMUNITY): Payer: Self-pay | Admitting: Emergency Medicine

## 2013-09-12 ENCOUNTER — Emergency Department (HOSPITAL_COMMUNITY)
Admission: EM | Admit: 2013-09-12 | Discharge: 2013-09-16 | Disposition: E | Payer: 59 | Attending: Emergency Medicine | Admitting: Emergency Medicine

## 2013-09-12 DIAGNOSIS — K219 Gastro-esophageal reflux disease without esophagitis: Secondary | ICD-10-CM | POA: Insufficient documentation

## 2013-09-12 DIAGNOSIS — R109 Unspecified abdominal pain: Secondary | ICD-10-CM | POA: Insufficient documentation

## 2013-09-12 DIAGNOSIS — Z8639 Personal history of other endocrine, nutritional and metabolic disease: Secondary | ICD-10-CM | POA: Insufficient documentation

## 2013-09-12 DIAGNOSIS — Z79899 Other long term (current) drug therapy: Secondary | ICD-10-CM | POA: Insufficient documentation

## 2013-09-12 DIAGNOSIS — I10 Essential (primary) hypertension: Secondary | ICD-10-CM | POA: Insufficient documentation

## 2013-09-12 DIAGNOSIS — I469 Cardiac arrest, cause unspecified: Secondary | ICD-10-CM | POA: Insufficient documentation

## 2013-09-12 DIAGNOSIS — Z794 Long term (current) use of insulin: Secondary | ICD-10-CM | POA: Insufficient documentation

## 2013-09-12 DIAGNOSIS — R0681 Apnea, not elsewhere classified: Secondary | ICD-10-CM | POA: Insufficient documentation

## 2013-09-12 DIAGNOSIS — Z8619 Personal history of other infectious and parasitic diseases: Secondary | ICD-10-CM | POA: Insufficient documentation

## 2013-09-12 DIAGNOSIS — Z8614 Personal history of Methicillin resistant Staphylococcus aureus infection: Secondary | ICD-10-CM | POA: Insufficient documentation

## 2013-09-12 DIAGNOSIS — Z862 Personal history of diseases of the blood and blood-forming organs and certain disorders involving the immune mechanism: Secondary | ICD-10-CM | POA: Insufficient documentation

## 2013-09-12 DIAGNOSIS — Z8589 Personal history of malignant neoplasm of other organs and systems: Secondary | ICD-10-CM | POA: Insufficient documentation

## 2013-09-12 DIAGNOSIS — Z66 Do not resuscitate: Secondary | ICD-10-CM | POA: Insufficient documentation

## 2013-09-16 NOTE — ED Notes (Signed)
Patient presents to ED via EMS. Patient in hospice care at home with family for cancer. Family called EMS for altered LOC this AM around 6am. When EMS arrived on scene, patient not breathing and heart rate lows 40's, EMS externally pacing patient and bagging patient, NPA in place, 18g AC placed to left AC, PICC line in place to right AC with TPN and NS infusing. When patient arrived to ED, no pulse. Dr Criss Alvine at bedside when patient arrived to ED.

## 2013-09-16 NOTE — ED Provider Notes (Signed)
CSN: [id number]     Arrival date & time 09/21/13  0810 History   First MD Initiated Contact with Patient 09/21/13 0820     Chief Complaint  Patient presents with  . Altered Mental Status   (Consider location/radiation/quality/duration/timing/severity/associated sxs/prior Treatment) HPI Comments: 57 year old male presents by EMS with acute altered mental status. Per EMS family states that he was altered this morning and not making sense per is normal.On the way into the EMS truck he started having trouble breathing and became apneic. He had a full DO NOT RESUSCITATE in place. Had a nasal trumpet placed and was provided oxygen. During this time he also started becoming bradycardic and they started to pace the patient. He did not never regain consciousness for them. Did not do any focal movements for them.  The history is provided by the EMS personnel.    Past Medical History  Diagnosis Date  . Hypertension   . GERD (gastroesophageal reflux disease)   . Small bowel obstruction 2013  . 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 Acute cholecystitis s/p perc draianage [id number] 12/20/2012  . Anemia of chronic disease 12/19/2012  . 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 MRSA bacteremia 01/04/2013  . SBO (small bowel obstruction) 11/07/2011  . Septic shock due to Staphylococcus aureus 12/24/2012  . Severe protein-calorie malnutrition 01/17/2013  . Hospice care patient    Past Surgical History  Procedure Laterality Date  . Tonsillectomy    . Esophagogastroduodenoscopy  12/11/2012    Procedure: ESOPHAGOGASTRODUODENOSCOPY (EGD);  Surgeon: Louis Meckel, MD;  Location: [facility] ENDOSCOPY;  Service: Endoscopy;  Laterality: N/A;  . Duodenal stent placement  12/11/2012    Procedure: DUODENAL STENT PLACEMENT;  Surgeon: Louis Meckel, MD;  Location: [facility] ENDOSCOPY;  Service: Endoscopy;  Laterality: N/A;  .  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 Peg placement  12/12/2012    Procedure: PERCUTANEOUS ENDOSCOPIC GASTROSTOMY (PEG) PLACEMENT;  Surgeon: Louis Meckel, MD;  Location: [facility] ENDOSCOPY;  Service: Endoscopy;  Laterality: N/A;  . Small intestine surgery  jan 2013    [facility].  SB resection, ileostomy, gastrostomy  . Debulking  sep 2013    [facility].  ileal SB resection, debulking of peritoneal carcinomatosis, intraperitoneal chemotherapy  . Colon surgery  oct 2013    [facility] transverse colon perforation  . Enteroscopy  12/30/2012    Procedure: ENTEROSCOPY;  Surgeon: Louis Meckel, MD;  Location: [facility] ENDOSCOPY;  Service: Endoscopy;  Laterality: N/A;  With possible stent placement; please have duodenal stent available  . Duodenal stent placement  12/30/2012    Procedure: DUODENAL STENT PLACEMENT;  Surgeon: Louis Meckel, MD;  Location: [facility] ENDOSCOPY;  Service: Endoscopy;  Laterality: N/A;   Family History  Problem Relation Age of Onset  . Lung cancer Brother   . Hypertension Mother   . Hypertension Father   . Heart failure Father    History  Substance Use Topics  . Smoking status: Never Smoker   . Smokeless tobacco: Never Used  . Alcohol Use: No     Comment: Stopped in July    Review of Systems  Unable to perform ROS: Patient unresponsive    Allergies  Percocet  Home Medications   Current Outpatient Rx  Name  Route  Sig  Dispense  Refill  . albuterol (PROVENTIL) (5 MG/ML) 0.5% nebulizer solution   Nebulization   Take 0.5 mLs (2.5 mg total) by nebulization every 2 (two) hours as needed for wheezing or shortness of breath.   20 mL   12   . antiseptic oral rinse (BIOTENE) LIQD   Mouth Rinse   15 mLs by Mouth Rinse route 2 times daily at 12 noon and 4 pm.   1000 mL   0   . fat emulsion 20 %  infusion   Intravenous   Inject 250 mLs into the vein daily. Intralipid Excel Container 50gm . Off at 8am and on at 8pm.         . fentaNYL (DURAGESIC - DOSED MCG/HR) 100 MCG/HR   Transdermal   Place 1 patch (100 mcg total) onto the skin every 3 (three) days.   5 patch   0   . HYDROmorphone (DILAUDID) 2 mg/mL SOLN   Intravenous   Inject 1 mg into the vein every hour as needed.   24 mg   0   . Injection Device (PCA INJECTOR) MISC   Intravenous   Inject into the vein See admin instructions. Hydromorphone HCL SDV 10mg /55ml vial. dilauded PCA: final concentration 1mg /ml:1mg /hr basal rate May have 1mg  bolus every 1 hour as needed for pain         . insulin aspart (NOVOLOG) 100 UNIT/ML injection   Subcutaneous   Inject 0-9 Units into the skin every 4 (four) hours.   1 vial   12   . ipratropium (ATROVENT) 0.02 % nebulizer solution   Nebulization   Take 2.5 mLs (0.5 mg total) by nebulization every 6 (six) hours as needed.   75 mL   12   . ondansetron (ZOFRAN) 4 MG/2ML SOLN injection   Intravenous   Inject 4 mg into the vein every 6 (six) hours as needed for nausea. Inject IV - Push every 6 hours as needed         . pantoprazole (PROTONIX) 40 MG injection   Intravenous   Inject 40 mg into the vein every morning. Infuse 40ml over 60 minutes         . promethazine (PHENERGAN) 25 MG/ML injection   Intravenous   Inject 1 mL (25 mg total) into the vein every 6 (six) hours as needed for nausea or vomiting.   10 mL   0   . sodium chloride 0.45 % solution      Sodium chloride 1000 n/s. Infuse 0.45% NS intravenously at 102ml/hr daily   1000 mL   0   . sodium chloride 0.9 % infusion   Intravenous   Inject 10 mLs into the vein continuous.   1000 mL   0   . sodium chloride 0.9 % SOLN 100 mL with micafungin 50 MG SOLR 100 mg   Intravenous   Inject 100 mg into the vein daily.   600 mg   0   . sodium chloride 0.9 % SOLN 80 mL with HYDROmorphone 10 MG/ML SOLN  200 mg   Intravenous   Inject 1 mg/hr into the vein continuous.   100 mg   0   . TPN ADULT   Intravenous   Inject into the vein continuous. infuvite 200-150/10 vial. Add 10 MV to TPN just prior to hanging every day. Infuse over 20 hours. Start at 1800-1900. [facility] [phone]  There were no vitals taken for this visit. Physical Exam  Nursing note and vitals reviewed. Constitutional:  Nasal trumpet in place  HENT:  Head: Normocephalic and atraumatic.  Right Ear: External ear normal.  Left Ear: External ear normal.  Nose: Nose normal.  Eyes: Right eye exhibits no discharge. Left eye exhibits no discharge.  Neck: Neck supple.  Cardiovascular: Regular rhythm.   Pulses:      Carotid pulses are 0 on the right side, and 0 on the left side.      Femoral pulses are 0 on the right side, and 0 on the left side. No heart sounds auscultated  Pulmonary/Chest: Apnea noted.  Ileostomy in place in mid abdomen  Abdominal: Soft. He exhibits no distension.  Musculoskeletal: He exhibits no edema.  Neurological: He is unresponsive.  Skin: Skin is dry.  Cool extremities    ED Course  Procedures (including critical care time) Labs Review Labs Reviewed - No data to display Imaging Review No results found.  MDM   1. Cardiac arrest    Upon my initial evaluation patient had expired. There were no pulses palpable, no heart beat to be auscultated, and patient was at neck. They patient was not being paced on my arrival. Patient has a DO NOT RESUSCITATE in place. EMS confirmed the family did not want any invasive measures done and wanted to respect his DO NOT RESUSCITATE. I notified family, including wife and siblings in the consult room. Answered all questions I could, but the family was obviously distraught. The chaplain was involved. Patient does not have a primary care physician and so will fill out a death certificate in the ED.   Audree Camel, MD 10/06/2013 1039

## 2013-09-16 NOTE — ED Notes (Signed)
[facility] called to pick up 3 IV pumps, spoke with Amad. Dilaudid 700mg /776ml IV given to Pharmacy Harrison Mons. TPN bag and 2 CADD IV pumps also given to Jill Alexanders to take to pharmacy.

## 2013-09-16 NOTE — ED Notes (Signed)
Patient expired at [facility]. Patient taken to morgue at 1100.

## 2013-09-16 DEATH — deceased

## 2013-09-30 ENCOUNTER — Telehealth (HOSPITAL_COMMUNITY): Payer: Self-pay | Admitting: Emergency Medicine

## 2013-09-30 NOTE — ED Notes (Signed)
[facility] dropped off new death certificate.  MD Pricilla Loveless signed the death certificate and Clydene Pugh was called to retrieve it.

## 2013-09-30 NOTE — ED Notes (Signed)
[facility] rcvd call from [employer] unable to locate death certificate signed by Dr Criss Alvine.  Current Clinical research associate had death certificate signed by Dr Criss Alvine on 10/1 and [facility]. Oran Rein gave signed death certificate [employer] [facility] employee same day 10/1.  [facility] generating another death certificate to have Dr Criss Alvine sign and will bring to [facility] loss death certificate by [facility] or [employer].

## 2014-01-16 IMAGING — CR DG CHEST 1V PORT
1 series · 1 of 1 positions shown · non-contrast
Comparison: Portable chest x-ray of 08/23/2013 and CT chest of
08/24/2013

CLINICAL DATA: Follow up pleural effusions and CHF

PORTABLE CHEST - 1 VIEW

[AP]
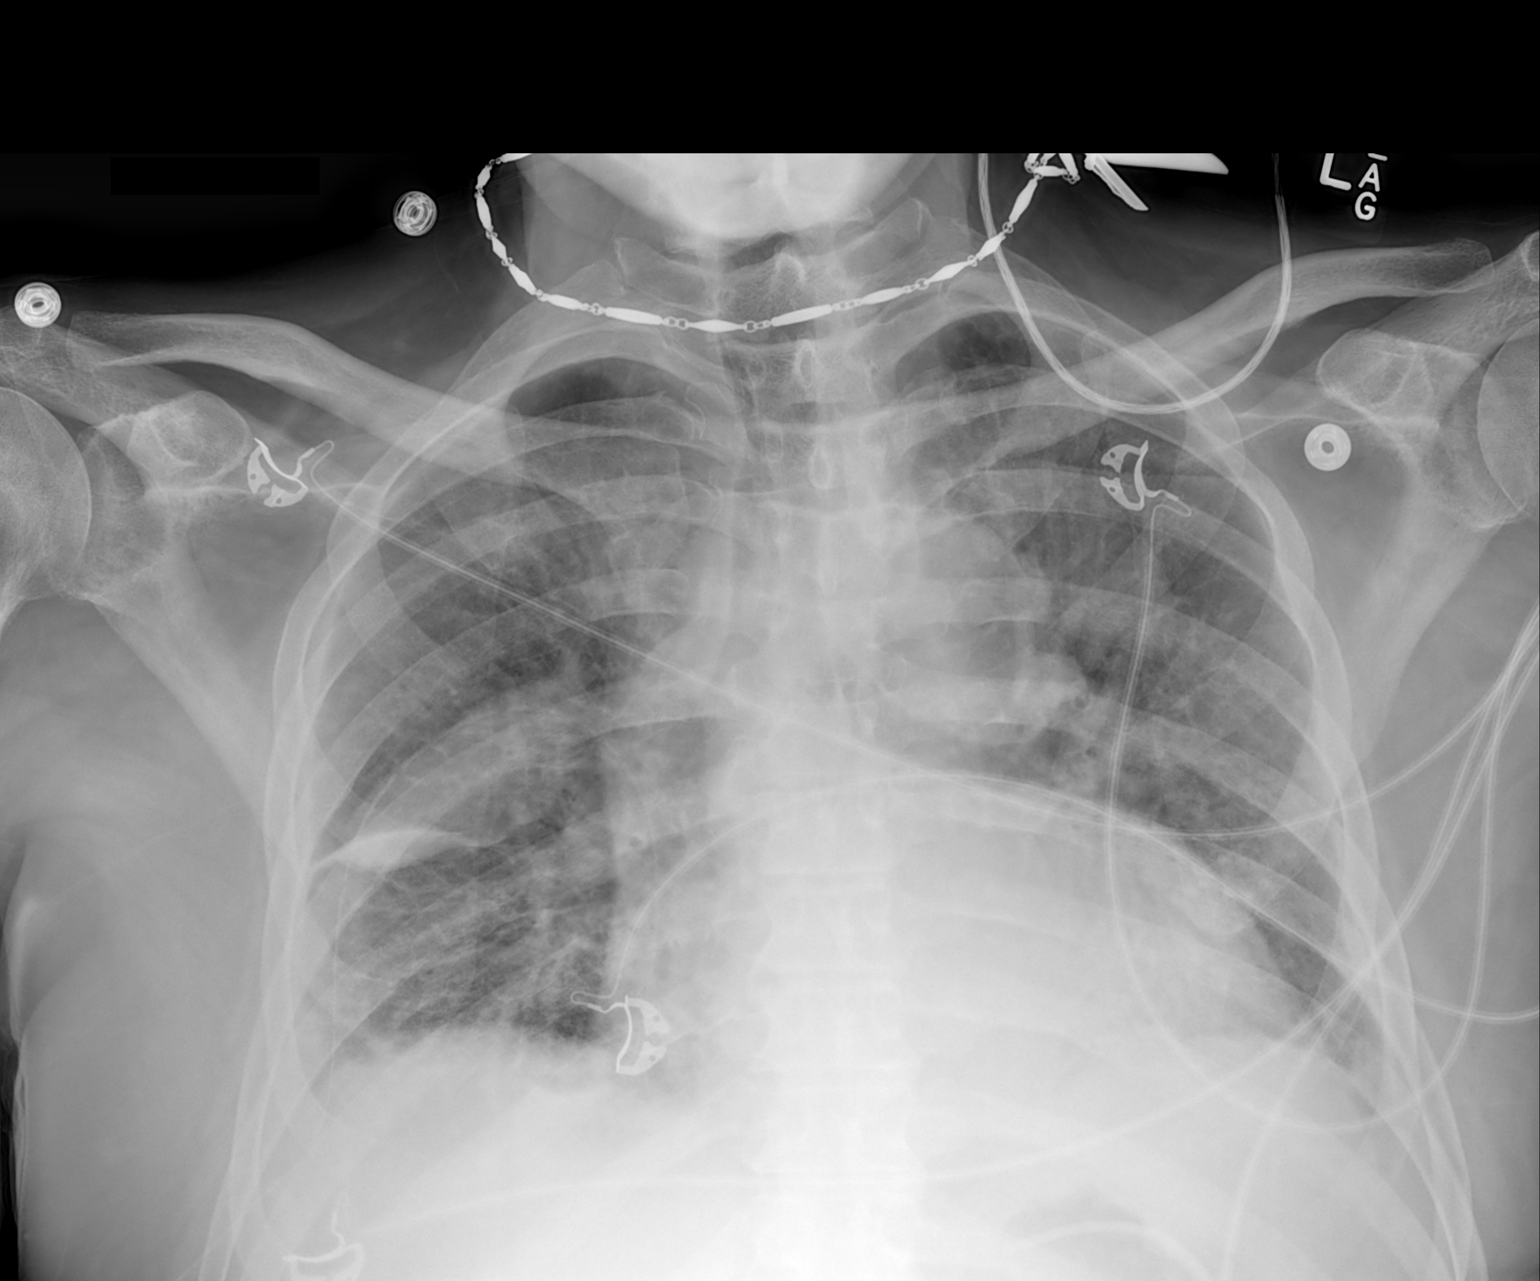

[1 of 1 positions shown; findings below may reference images not displayed]

FINDINGS: There has been some improvement in the pulmonary vascular
congestion and mild edema pattern.  Mild cardiomegaly is stable and
there may be tiny effusions remaining.
IMPRESSION: Improvement in mild CHF with small effusions.

## 2014-01-18 IMAGING — CR DG CHEST 1V PORT
1 series · 1 of 1 positions shown · non-contrast
Comparison: 1777 hr the same day and earlier.

CLINICAL DATA: 56-year-old male PICC line placement.

EXAM:
PORTABLE CHEST - 1 VIEW

[AP]
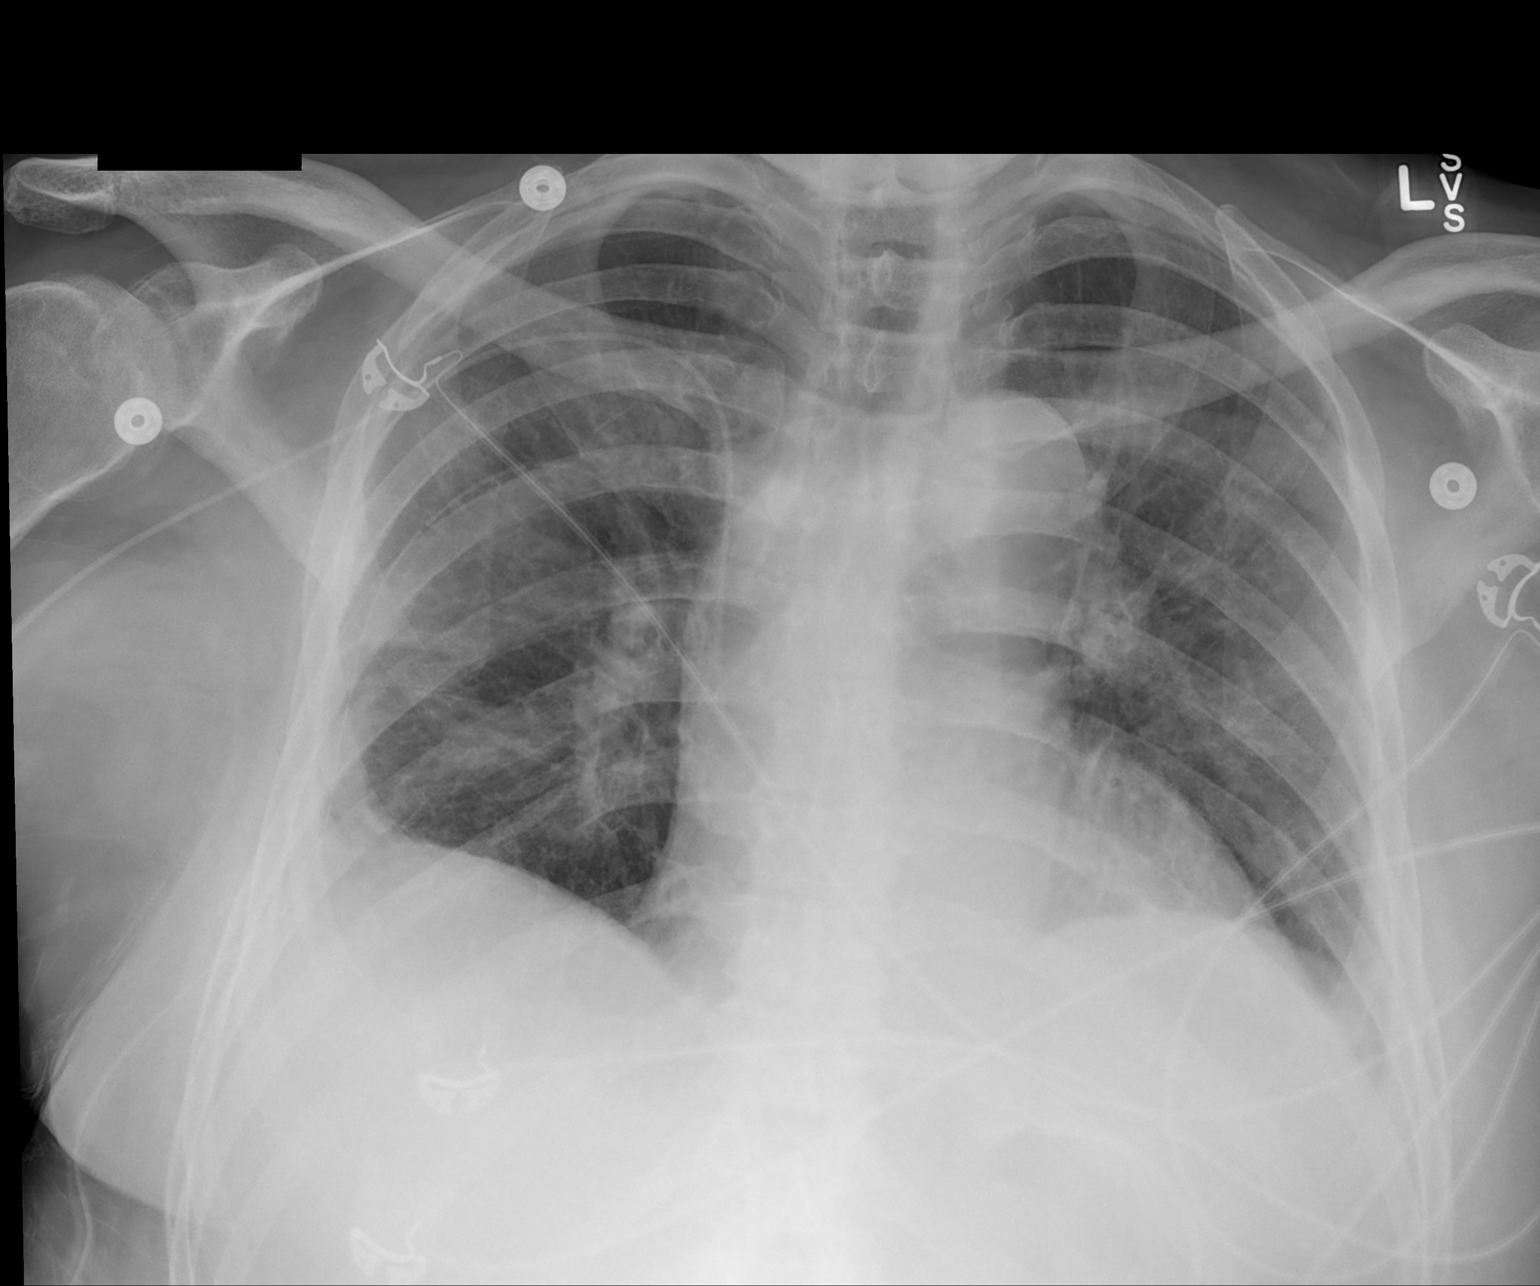

[1 of 1 positions shown; findings below may reference images not displayed]

FINDINGS: Portable AP semi upright view at 7088 hrs. Interval adjustment of
the right PICC line, tip now at the level of the cavoatrial
junction. Otherwise stable chest.
IMPRESSION: PICC line withdrawn, tip now at the level of the cavoatrial
junction.

## 2014-01-18 IMAGING — CR DG CHEST 1V PORT
1 series · 1 of 1 positions shown · non-contrast
Comparison: 08/27/2013

CLINICAL DATA: Evaluate PICC line placement

PORTABLE CHEST - 1 VIEW

[AP]
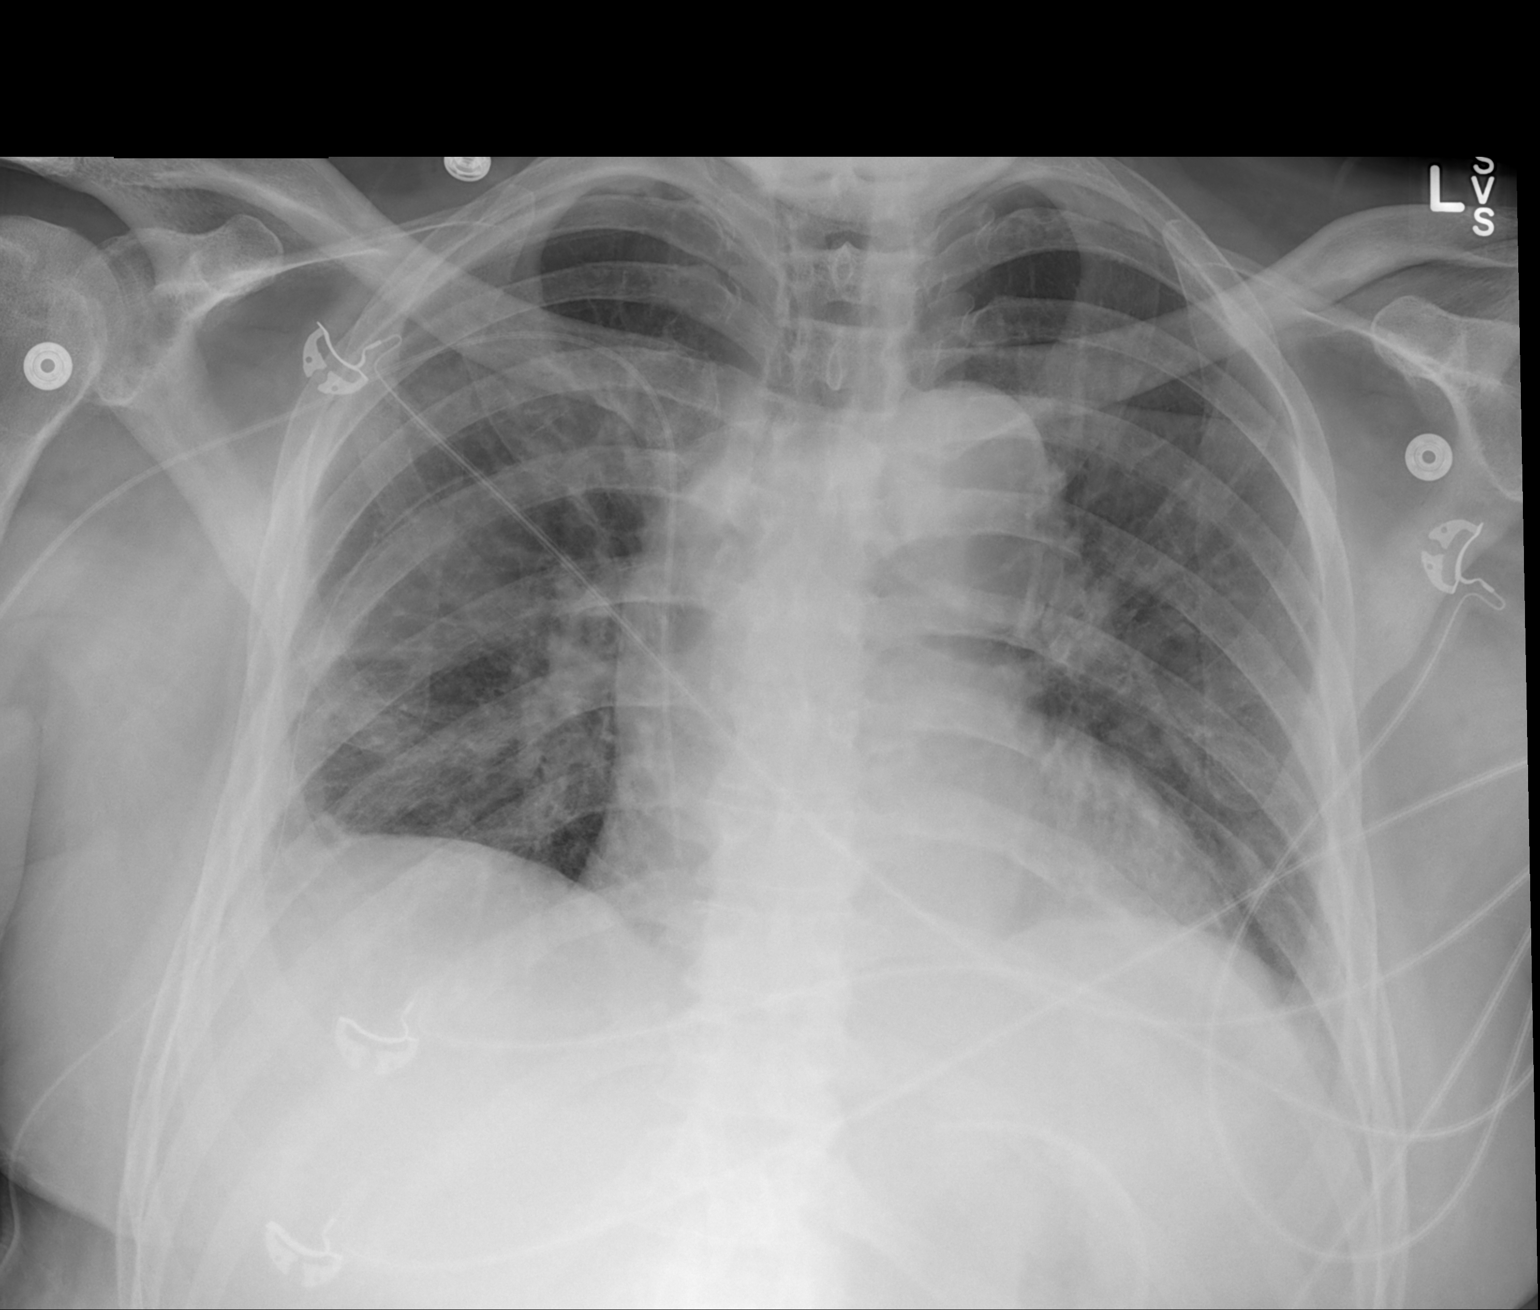

[1 of 1 positions shown; findings below may reference images not displayed]

FINDINGS: Right PICC line has been placed with tip over right
atrium.  No pneumothorax.  Mild bibasilar atelectasis with mildly
improved aeration on the right.
IMPRESSION: PICC line as described.
